# Patient Record
Sex: Female | Born: 1950 | Race: White | Hispanic: No | Smoking: Current every day smoker
Health system: Southern US, Community
[De-identification: ages and names within clinical notes are randomized; demographics above are authoritative.]

## PROBLEM LIST (undated history)

## (undated) DIAGNOSIS — D649 Anemia, unspecified: Secondary | ICD-10-CM

## (undated) DIAGNOSIS — M1990 Unspecified osteoarthritis, unspecified site: Secondary | ICD-10-CM

## (undated) DIAGNOSIS — G510 Bell's palsy: Secondary | ICD-10-CM

## (undated) DIAGNOSIS — K047 Periapical abscess without sinus: Secondary | ICD-10-CM

## (undated) DIAGNOSIS — F329 Major depressive disorder, single episode, unspecified: Secondary | ICD-10-CM

## (undated) DIAGNOSIS — E785 Hyperlipidemia, unspecified: Secondary | ICD-10-CM

## (undated) DIAGNOSIS — G4733 Obstructive sleep apnea (adult) (pediatric): Secondary | ICD-10-CM

## (undated) DIAGNOSIS — M8580 Other specified disorders of bone density and structure, unspecified site: Secondary | ICD-10-CM

## (undated) DIAGNOSIS — K589 Irritable bowel syndrome without diarrhea: Secondary | ICD-10-CM

## (undated) DIAGNOSIS — T4145XA Adverse effect of unspecified anesthetic, initial encounter: Secondary | ICD-10-CM

## (undated) DIAGNOSIS — J449 Chronic obstructive pulmonary disease, unspecified: Secondary | ICD-10-CM

## (undated) DIAGNOSIS — E559 Vitamin D deficiency, unspecified: Secondary | ICD-10-CM

## (undated) DIAGNOSIS — E119 Type 2 diabetes mellitus without complications: Secondary | ICD-10-CM

## (undated) DIAGNOSIS — F419 Anxiety disorder, unspecified: Secondary | ICD-10-CM

## (undated) DIAGNOSIS — K219 Gastro-esophageal reflux disease without esophagitis: Secondary | ICD-10-CM

## (undated) DIAGNOSIS — G309 Alzheimer's disease, unspecified: Secondary | ICD-10-CM

## (undated) DIAGNOSIS — T8859XA Other complications of anesthesia, initial encounter: Secondary | ICD-10-CM

## (undated) DIAGNOSIS — F0280 Dementia in other diseases classified elsewhere without behavioral disturbance: Secondary | ICD-10-CM

## (undated) DIAGNOSIS — G4730 Sleep apnea, unspecified: Secondary | ICD-10-CM

## (undated) DIAGNOSIS — T7840XA Allergy, unspecified, initial encounter: Secondary | ICD-10-CM

## (undated) DIAGNOSIS — S7292XA Unspecified fracture of left femur, initial encounter for closed fracture: Secondary | ICD-10-CM

## (undated) DIAGNOSIS — H269 Unspecified cataract: Secondary | ICD-10-CM

## (undated) DIAGNOSIS — F32A Depression, unspecified: Secondary | ICD-10-CM

## (undated) DIAGNOSIS — K8020 Calculus of gallbladder without cholecystitis without obstruction: Secondary | ICD-10-CM

## (undated) DIAGNOSIS — E66813 Obesity, class 3: Secondary | ICD-10-CM

## (undated) DIAGNOSIS — I10 Essential (primary) hypertension: Secondary | ICD-10-CM

## (undated) HISTORY — PX: TONSILLECTOMY AND ADENOIDECTOMY: SUR1326

## (undated) HISTORY — DX: Hyperlipidemia, unspecified: E78.5

## (undated) HISTORY — DX: Allergy, unspecified, initial encounter: T78.40XA

## (undated) HISTORY — DX: Calculus of gallbladder without cholecystitis without obstruction: K80.20

## (undated) HISTORY — PX: CATARACT EXTRACTION: SUR2

## (undated) HISTORY — DX: Depression, unspecified: F32.A

## (undated) HISTORY — PX: EYE SURGERY: SHX253

## (undated) HISTORY — DX: Irritable bowel syndrome without diarrhea: K58.9

## (undated) HISTORY — DX: Bell's palsy: G51.0

## (undated) HISTORY — DX: Obesity, class 3: E66.813

## (undated) HISTORY — DX: Unspecified fracture of left femur, initial encounter for closed fracture: S72.92XA

## (undated) HISTORY — DX: Essential (primary) hypertension: I10

## (undated) HISTORY — DX: Sleep apnea, unspecified: G47.30

## (undated) HISTORY — DX: Obstructive sleep apnea (adult) (pediatric): G47.33

## (undated) HISTORY — DX: Vitamin D deficiency, unspecified: E55.9

## (undated) HISTORY — PX: BLADDER SUSPENSION: SHX72

## (undated) HISTORY — DX: Other specified disorders of bone density and structure, unspecified site: M85.80

## (undated) HISTORY — DX: Morbid (severe) obesity due to excess calories: E66.01

## (undated) HISTORY — DX: Dementia in other diseases classified elsewhere, unspecified severity, without behavioral disturbance, psychotic disturbance, mood disturbance, and anxiety: F02.80

## (undated) HISTORY — PX: JOINT REPLACEMENT: SHX530

## (undated) HISTORY — DX: Major depressive disorder, single episode, unspecified: F32.9

## (undated) HISTORY — DX: Anxiety disorder, unspecified: F41.9

## (undated) HISTORY — DX: Unspecified cataract: H26.9

## (undated) HISTORY — DX: Alzheimer's disease, unspecified: G30.9

## (undated) HISTORY — PX: SPINE SURGERY: SHX786

---
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
## 1985-01-07 DIAGNOSIS — G510 Bell's palsy: Secondary | ICD-10-CM | POA: Insufficient documentation

## 1985-01-07 HISTORY — DX: Bell's palsy: G51.0

## 1988-02-07 DIAGNOSIS — K589 Irritable bowel syndrome without diarrhea: Secondary | ICD-10-CM

## 1988-02-07 HISTORY — DX: Irritable bowel syndrome, unspecified: K58.9

## 1988-04-09 DIAGNOSIS — F325 Major depressive disorder, single episode, in full remission: Secondary | ICD-10-CM | POA: Insufficient documentation

## 1998-08-02 ENCOUNTER — Encounter: Payer: Self-pay | Admitting: Internal Medicine

## 1998-08-02 ENCOUNTER — Ambulatory Visit (HOSPITAL_COMMUNITY): Admission: RE | Admit: 1998-08-02 | Discharge: 1998-08-02 | Payer: Self-pay | Admitting: Internal Medicine

## 1999-01-06 ENCOUNTER — Other Ambulatory Visit: Admission: RE | Admit: 1999-01-06 | Discharge: 1999-01-06 | Payer: Self-pay | Admitting: *Deleted

## 1999-01-06 ENCOUNTER — Encounter (INDEPENDENT_AMBULATORY_CARE_PROVIDER_SITE_OTHER): Payer: Self-pay

## 1999-08-13 ENCOUNTER — Ambulatory Visit (HOSPITAL_COMMUNITY): Admission: RE | Admit: 1999-08-13 | Discharge: 1999-08-13 | Payer: Self-pay | Admitting: Internal Medicine

## 1999-08-13 ENCOUNTER — Encounter: Payer: Self-pay | Admitting: Internal Medicine

## 2000-01-07 ENCOUNTER — Other Ambulatory Visit: Admission: RE | Admit: 2000-01-07 | Discharge: 2000-01-07 | Payer: Self-pay | Admitting: *Deleted

## 2000-12-14 ENCOUNTER — Ambulatory Visit (HOSPITAL_BASED_OUTPATIENT_CLINIC_OR_DEPARTMENT_OTHER): Admission: RE | Admit: 2000-12-14 | Discharge: 2000-12-14 | Payer: Self-pay

## 2001-01-21 ENCOUNTER — Other Ambulatory Visit: Admission: RE | Admit: 2001-01-21 | Discharge: 2001-01-21 | Payer: Self-pay | Admitting: *Deleted

## 2002-02-23 ENCOUNTER — Other Ambulatory Visit: Admission: RE | Admit: 2002-02-23 | Discharge: 2002-02-23 | Payer: Self-pay | Admitting: *Deleted

## 2002-10-16 ENCOUNTER — Encounter: Admission: RE | Admit: 2002-10-16 | Discharge: 2003-01-14 | Payer: Self-pay | Admitting: Internal Medicine

## 2003-02-26 ENCOUNTER — Other Ambulatory Visit: Admission: RE | Admit: 2003-02-26 | Discharge: 2003-02-26 | Payer: Self-pay | Admitting: *Deleted

## 2003-10-26 ENCOUNTER — Ambulatory Visit (HOSPITAL_COMMUNITY): Admission: RE | Admit: 2003-10-26 | Discharge: 2003-10-26 | Payer: Self-pay | Admitting: Internal Medicine

## 2004-02-27 ENCOUNTER — Other Ambulatory Visit: Admission: RE | Admit: 2004-02-27 | Discharge: 2004-02-27 | Payer: Self-pay | Admitting: *Deleted

## 2004-11-12 ENCOUNTER — Ambulatory Visit (HOSPITAL_COMMUNITY): Admission: RE | Admit: 2004-11-12 | Discharge: 2004-11-12 | Payer: Self-pay | Admitting: Anesthesiology

## 2005-04-07 ENCOUNTER — Other Ambulatory Visit: Admission: RE | Admit: 2005-04-07 | Discharge: 2005-04-07 | Payer: Self-pay | Admitting: Obstetrics & Gynecology

## 2005-05-04 ENCOUNTER — Encounter: Admission: RE | Admit: 2005-05-04 | Discharge: 2005-05-04 | Payer: Self-pay | Admitting: Gastroenterology

## 2005-05-07 HISTORY — PX: COLONOSCOPY: SHX174

## 2005-05-08 LAB — HM COLONOSCOPY: HM Colonoscopy: NORMAL

## 2006-04-06 ENCOUNTER — Ambulatory Visit (HOSPITAL_BASED_OUTPATIENT_CLINIC_OR_DEPARTMENT_OTHER): Admission: RE | Admit: 2006-04-06 | Discharge: 2006-04-06 | Payer: Self-pay | Admitting: Urology

## 2006-05-13 ENCOUNTER — Other Ambulatory Visit: Admission: RE | Admit: 2006-05-13 | Discharge: 2006-05-13 | Payer: Self-pay | Admitting: Obstetrics & Gynecology

## 2006-08-12 ENCOUNTER — Ambulatory Visit (HOSPITAL_BASED_OUTPATIENT_CLINIC_OR_DEPARTMENT_OTHER): Admission: RE | Admit: 2006-08-12 | Discharge: 2006-08-12 | Payer: Self-pay | Admitting: Urology

## 2006-12-02 ENCOUNTER — Ambulatory Visit (HOSPITAL_COMMUNITY): Admission: RE | Admit: 2006-12-02 | Discharge: 2006-12-02 | Payer: Self-pay | Admitting: Internal Medicine

## 2007-01-17 ENCOUNTER — Ambulatory Visit (HOSPITAL_BASED_OUTPATIENT_CLINIC_OR_DEPARTMENT_OTHER): Admission: RE | Admit: 2007-01-17 | Discharge: 2007-01-17 | Payer: Self-pay | Admitting: Urology

## 2007-05-23 ENCOUNTER — Other Ambulatory Visit: Admission: RE | Admit: 2007-05-23 | Discharge: 2007-05-23 | Payer: Self-pay | Admitting: Obstetrics & Gynecology

## 2008-05-30 ENCOUNTER — Other Ambulatory Visit: Admission: RE | Admit: 2008-05-30 | Discharge: 2008-05-30 | Payer: Self-pay | Admitting: Obstetrics & Gynecology

## 2008-11-16 ENCOUNTER — Ambulatory Visit (HOSPITAL_COMMUNITY): Admission: RE | Admit: 2008-11-16 | Discharge: 2008-11-16 | Payer: Self-pay | Admitting: Internal Medicine

## 2008-12-04 ENCOUNTER — Encounter: Admission: RE | Admit: 2008-12-04 | Discharge: 2009-03-04 | Payer: Self-pay | Admitting: Internal Medicine

## 2009-04-25 ENCOUNTER — Ambulatory Visit (HOSPITAL_COMMUNITY): Admission: RE | Admit: 2009-04-25 | Discharge: 2009-04-25 | Payer: Self-pay | Admitting: Chiropractic Medicine

## 2009-08-06 ENCOUNTER — Ambulatory Visit (HOSPITAL_COMMUNITY): Admission: RE | Admit: 2009-08-06 | Discharge: 2009-08-06 | Payer: Self-pay | Admitting: Anesthesiology

## 2009-09-24 ENCOUNTER — Encounter: Admission: RE | Admit: 2009-09-24 | Discharge: 2009-09-24 | Payer: Self-pay | Admitting: Internal Medicine

## 2009-10-02 ENCOUNTER — Encounter: Admission: RE | Admit: 2009-10-02 | Discharge: 2009-10-02 | Payer: Self-pay | Admitting: Internal Medicine

## 2009-12-12 ENCOUNTER — Encounter: Admission: RE | Admit: 2009-12-12 | Discharge: 2009-12-12 | Payer: Self-pay | Admitting: Internal Medicine

## 2010-03-09 HISTORY — PX: TOTAL KNEE ARTHROPLASTY: SHX125

## 2010-06-09 ENCOUNTER — Encounter (HOSPITAL_COMMUNITY)
Admission: RE | Admit: 2010-06-09 | Discharge: 2010-06-09 | Disposition: A | Discharge status: Home / Self Care | Payer: 59 | Source: Ambulatory Visit | Attending: Orthopaedic Surgery | Admitting: Orthopaedic Surgery

## 2010-06-09 DIAGNOSIS — Z0189 Encounter for other specified special examinations: Secondary | ICD-10-CM | POA: Insufficient documentation

## 2010-06-09 LAB — URINALYSIS, ROUTINE W REFLEX MICROSCOPIC
Glucose, UA: NEGATIVE mg/dL
Ketones, ur: NEGATIVE mg/dL
Nitrite: NEGATIVE
Urobilinogen, UA: 0.2 mg/dL (ref 0.0–1.0)

## 2010-06-09 LAB — URINE MICROSCOPIC-ADD ON

## 2010-06-09 LAB — SURGICAL PCR SCREEN
MRSA, PCR: NEGATIVE
Staphylococcus aureus: NEGATIVE

## 2010-06-09 LAB — COMPREHENSIVE METABOLIC PANEL
ALT: 18 U/L (ref 0–35)
AST: 22 U/L (ref 0–37)
Alkaline Phosphatase: 95 U/L (ref 39–117)
Calcium: 9.3 mg/dL (ref 8.4–10.5)
Creatinine, Ser: 0.88 mg/dL (ref 0.4–1.2)
Total Bilirubin: 0.4 mg/dL (ref 0.3–1.2)
Total Protein: 6.6 g/dL (ref 6.0–8.3)

## 2010-06-09 LAB — DIFFERENTIAL
Eosinophils Absolute: 0.3 10*3/uL (ref 0.0–0.7)
Eosinophils Relative: 2 % (ref 0–5)
Lymphocytes Relative: 25 % (ref 12–46)
Lymphs Abs: 2.7 10*3/uL (ref 0.7–4.0)
Monocytes Absolute: 0.7 10*3/uL (ref 0.1–1.0)
Monocytes Relative: 7 % (ref 3–12)
Neutrophils Relative %: 65 % (ref 43–77)

## 2010-06-09 LAB — CBC
Hemoglobin: 13.3 g/dL (ref 12.0–15.0)
MCH: 28.9 pg (ref 26.0–34.0)
MCV: 85.7 fL (ref 78.0–100.0)
RBC: 4.6 MIL/uL (ref 3.87–5.11)

## 2010-06-09 LAB — APTT: aPTT: 25 seconds (ref 24–37)

## 2010-06-10 LAB — URINE CULTURE: Culture  Setup Time: 201204021603

## 2010-06-17 ENCOUNTER — Inpatient Hospital Stay (HOSPITAL_COMMUNITY)
Admission: RE | Admit: 2010-06-17 | Discharge: 2010-06-20 | DRG: 470 | Disposition: A | Discharge status: Skilled Nursing Facility | Payer: 59 | Source: Ambulatory Visit | Attending: Orthopaedic Surgery | Admitting: Orthopaedic Surgery

## 2010-06-17 ENCOUNTER — Other Ambulatory Visit (HOSPITAL_COMMUNITY): Payer: Self-pay | Admitting: Orthopaedic Surgery

## 2010-06-17 ENCOUNTER — Ambulatory Visit (HOSPITAL_COMMUNITY)
Admission: RE | Admit: 2010-06-17 | Discharge: 2010-06-17 | Disposition: A | Discharge status: Home / Self Care | Payer: 59 | Source: Ambulatory Visit | Attending: Orthopaedic Surgery | Admitting: Orthopaedic Surgery

## 2010-06-17 DIAGNOSIS — I10 Essential (primary) hypertension: Secondary | ICD-10-CM | POA: Diagnosis present

## 2010-06-17 DIAGNOSIS — F17200 Nicotine dependence, unspecified, uncomplicated: Secondary | ICD-10-CM | POA: Diagnosis present

## 2010-06-17 DIAGNOSIS — K589 Irritable bowel syndrome without diarrhea: Secondary | ICD-10-CM | POA: Diagnosis present

## 2010-06-17 DIAGNOSIS — E119 Type 2 diabetes mellitus without complications: Secondary | ICD-10-CM | POA: Diagnosis present

## 2010-06-17 DIAGNOSIS — J449 Chronic obstructive pulmonary disease, unspecified: Secondary | ICD-10-CM | POA: Diagnosis present

## 2010-06-17 DIAGNOSIS — E669 Obesity, unspecified: Secondary | ICD-10-CM | POA: Diagnosis present

## 2010-06-17 DIAGNOSIS — E559 Vitamin D deficiency, unspecified: Secondary | ICD-10-CM | POA: Diagnosis present

## 2010-06-17 DIAGNOSIS — E785 Hyperlipidemia, unspecified: Secondary | ICD-10-CM | POA: Diagnosis present

## 2010-06-17 DIAGNOSIS — M1710 Unilateral primary osteoarthritis, unspecified knee: Principal | ICD-10-CM | POA: Diagnosis present

## 2010-06-17 DIAGNOSIS — J4489 Other specified chronic obstructive pulmonary disease: Secondary | ICD-10-CM | POA: Diagnosis present

## 2010-06-17 DIAGNOSIS — D62 Acute posthemorrhagic anemia: Secondary | ICD-10-CM | POA: Diagnosis not present

## 2010-06-17 LAB — ABO/RH: ABO/RH(D): O POS

## 2010-06-17 LAB — GLUCOSE, CAPILLARY: Glucose-Capillary: 98 mg/dL (ref 70–99)

## 2010-06-17 LAB — HCG, SERUM, QUALITATIVE: Preg, Serum: NEGATIVE

## 2010-06-18 LAB — CBC
HCT: 32.2 % — ABNORMAL LOW (ref 36.0–46.0)
MCHC: 32 g/dL (ref 30.0–36.0)
MCV: 87.7 fL (ref 78.0–100.0)
Platelets: 159 10*3/uL (ref 150–400)
RDW: 14.3 % (ref 11.5–15.5)

## 2010-06-18 LAB — HEMOGLOBIN A1C
Hgb A1c MFr Bld: 6.7 % — ABNORMAL HIGH (ref <5.7)
Mean Plasma Glucose: 146 mg/dL — ABNORMAL HIGH (ref <117)

## 2010-06-18 LAB — COMPREHENSIVE METABOLIC PANEL
Albumin: 2.8 g/dL — ABNORMAL LOW (ref 3.5–5.2)
BUN: 10 mg/dL (ref 6–23)
Calcium: 8.3 mg/dL — ABNORMAL LOW (ref 8.4–10.5)
Glucose, Bld: 107 mg/dL — ABNORMAL HIGH (ref 70–99)
Sodium: 137 mEq/L (ref 135–145)
Total Protein: 5.4 g/dL — ABNORMAL LOW (ref 6.0–8.3)

## 2010-06-18 LAB — GLUCOSE, CAPILLARY
Glucose-Capillary: 105 mg/dL — ABNORMAL HIGH (ref 70–99)
Glucose-Capillary: 129 mg/dL — ABNORMAL HIGH (ref 70–99)
Glucose-Capillary: 105 mg/dL — ABNORMAL HIGH (ref 70–99)

## 2010-06-19 LAB — COMPREHENSIVE METABOLIC PANEL
AST: 28 U/L (ref 0–37)
Albumin: 2.8 g/dL — ABNORMAL LOW (ref 3.5–5.2)
CO2: 28 mEq/L (ref 19–32)
Calcium: 8.5 mg/dL (ref 8.4–10.5)
Creatinine, Ser: 0.84 mg/dL (ref 0.4–1.2)
GFR calc Af Amer: >60 mL/min (ref >60)
GFR calc non Af Amer: >60 mL/min (ref >60)
Total Protein: 5.7 g/dL — ABNORMAL LOW (ref 6.0–8.3)

## 2010-06-19 LAB — CBC
MCH: 27.8 pg (ref 26.0–34.0)
MCHC: 32.5 g/dL (ref 30.0–36.0)
Platelets: 167 10*3/uL (ref 150–400)
RDW: 13.9 % (ref 11.5–15.5)

## 2010-06-19 LAB — GLUCOSE, CAPILLARY

## 2010-06-20 DIAGNOSIS — M79609 Pain in unspecified limb: Secondary | ICD-10-CM

## 2010-06-20 LAB — CROSSMATCH

## 2010-06-20 LAB — COMPREHENSIVE METABOLIC PANEL
ALT: 20 U/L (ref 0–35)
BUN: 5 mg/dL — ABNORMAL LOW (ref 6–23)
Calcium: 8.5 mg/dL (ref 8.4–10.5)
Glucose, Bld: 100 mg/dL — ABNORMAL HIGH (ref 70–99)
Sodium: 137 mEq/L (ref 135–145)
Total Protein: 5.7 g/dL — ABNORMAL LOW (ref 6.0–8.3)

## 2010-06-20 LAB — GLUCOSE, CAPILLARY
Glucose-Capillary: 111 mg/dL — ABNORMAL HIGH (ref 70–99)
Glucose-Capillary: 98 mg/dL (ref 70–99)
Glucose-Capillary: 98 mg/dL (ref 70–99)

## 2010-06-20 LAB — CBC
HCT: 29.8 % — ABNORMAL LOW (ref 36.0–46.0)
MCHC: 32.9 g/dL (ref 30.0–36.0)
RDW: 14 % (ref 11.5–15.5)

## 2010-06-22 ENCOUNTER — Inpatient Hospital Stay (HOSPITAL_COMMUNITY)
Admission: EM | Admit: 2010-06-22 | Discharge: 2010-06-25 | DRG: 561 | Disposition: A | Discharge status: Skilled Nursing Facility | Payer: 59 | Attending: Orthopaedic Surgery | Admitting: Orthopaedic Surgery

## 2010-06-22 ENCOUNTER — Emergency Department (HOSPITAL_COMMUNITY): Payer: 59

## 2010-06-22 DIAGNOSIS — K219 Gastro-esophageal reflux disease without esophagitis: Secondary | ICD-10-CM | POA: Diagnosis present

## 2010-06-22 DIAGNOSIS — I10 Essential (primary) hypertension: Secondary | ICD-10-CM | POA: Diagnosis present

## 2010-06-22 DIAGNOSIS — E669 Obesity, unspecified: Secondary | ICD-10-CM | POA: Diagnosis present

## 2010-06-22 DIAGNOSIS — E559 Vitamin D deficiency, unspecified: Secondary | ICD-10-CM | POA: Diagnosis present

## 2010-06-22 DIAGNOSIS — E785 Hyperlipidemia, unspecified: Secondary | ICD-10-CM | POA: Diagnosis present

## 2010-06-22 DIAGNOSIS — E119 Type 2 diabetes mellitus without complications: Secondary | ICD-10-CM | POA: Diagnosis present

## 2010-06-22 DIAGNOSIS — J4489 Other specified chronic obstructive pulmonary disease: Secondary | ICD-10-CM | POA: Diagnosis present

## 2010-06-22 DIAGNOSIS — E7800 Pure hypercholesterolemia, unspecified: Secondary | ICD-10-CM | POA: Diagnosis present

## 2010-06-22 DIAGNOSIS — Z96659 Presence of unspecified artificial knee joint: Secondary | ICD-10-CM

## 2010-06-22 DIAGNOSIS — Y9210 Unspecified residential institution as the place of occurrence of the external cause: Secondary | ICD-10-CM | POA: Diagnosis present

## 2010-06-22 DIAGNOSIS — Z87891 Personal history of nicotine dependence: Secondary | ICD-10-CM

## 2010-06-22 DIAGNOSIS — J449 Chronic obstructive pulmonary disease, unspecified: Secondary | ICD-10-CM | POA: Diagnosis present

## 2010-06-22 DIAGNOSIS — T84029A Dislocation of unspecified internal joint prosthesis, initial encounter: Principal | ICD-10-CM | POA: Diagnosis present

## 2010-06-22 DIAGNOSIS — K589 Irritable bowel syndrome without diarrhea: Secondary | ICD-10-CM | POA: Diagnosis present

## 2010-06-22 DIAGNOSIS — X58XXXA Exposure to other specified factors, initial encounter: Secondary | ICD-10-CM

## 2010-06-22 LAB — POCT I-STAT, CHEM 8
BUN: 10 mg/dL (ref 6–23)
Calcium, Ion: 1.05 mmol/L — ABNORMAL LOW (ref 1.12–1.32)
Chloride: 100 meq/L (ref 96–112)
Creatinine, Ser: 0.9 mg/dL (ref 0.4–1.2)
Glucose, Bld: 98 mg/dL (ref 70–99)
HCT: 31 % — ABNORMAL LOW (ref 36.0–46.0)
Hemoglobin: 10.5 g/dL — ABNORMAL LOW (ref 12.0–15.0)
Potassium: 3.5 meq/L (ref 3.5–5.1)
Sodium: 137 mEq/L (ref 135–145)
TCO2: 28 mmol/L (ref 0–100)

## 2010-06-22 LAB — DIFFERENTIAL
Basophils Absolute: 0 10*3/uL (ref 0.0–0.1)
Basophils Relative: 0 % (ref 0–1)
Neutro Abs: 4.2 10*3/uL (ref 1.7–7.7)
Neutrophils Relative %: 62 % (ref 43–77)

## 2010-06-22 LAB — CBC
Hemoglobin: 10.1 g/dL — ABNORMAL LOW (ref 12.0–15.0)
MCHC: 32.9 g/dL (ref 30.0–36.0)
Platelets: 217 10*3/uL (ref 150–400)
RBC: 3.63 MIL/uL — ABNORMAL LOW (ref 3.87–5.11)

## 2010-06-22 LAB — PROTIME-INR
INR: 1.69 — ABNORMAL HIGH (ref 0.00–1.49)
Prothrombin Time: 20.1 seconds — ABNORMAL HIGH (ref 11.6–15.2)

## 2010-06-23 LAB — GLUCOSE, CAPILLARY: Glucose-Capillary: 97 mg/dL (ref 70–99)

## 2010-06-23 NOTE — H&P (Signed)
[name], [name]                ACCOUNT NO.:  [id number]  MEDICAL RECORD NO.:  [id number]           PATIENT TYPE:  I  LOCATION:  5010                         FACILITY:  [facility]  PHYSICIAN:  Eulas Post, MD    DATE OF BIRTH:  Apr 22, 1950  DATE OF ADMISSION:  06/22/2010 DATE OF DISCHARGE:                             HISTORY & PHYSICAL   CHIEF COMPLAINT:  Left knee pain.  HISTORY:  [name] is a pleasant 60 year old woman who had a left total knee arthroplasty performed by Dr. Cleophas Dunker on June 17, 2010.  There was apparently some concern regarding a partial MCL injury at that time.  She did have a rotating platform arthroplasty performed. She was getting out of a chair and walking with her walker going from the seat to a stand yesterday when she was in [place] at her rehab facility, and had the acute onset of severe pain.  She says she did not think that she was twisting, but mainly trying to stand and propel herself forward in a straight line.  Nonetheless, she had acute onset severe pain in the left knee and was unable to walk.  She was brought into the emergency room today for evaluation.  She continues to have 9/10 pain directly over the left knee, and weightbearing is impossible. Rest makes it better.  PAST HISTORY:  Significant for diabetes as well as hypertension and hypercholesterolemia, history of irritable bowel syndrome, sleep apnea, history of COPD, vitamin D deficiency, and recent acute blood loss anemia.  FAMILY HISTORY:  Her father has diabetes and heart disease.  SOCIAL HISTORY:  She has recently quit smoking.  REVIEW OF SYSTEMS:  Complete review of systems was performed and was otherwise negative with the exception of those mentioned in the history of present illness.  PHYSICAL EXAMINATION:  CONSTITUTIONAL:  She is alert and oriented x3 and appropriate with me throughout our interaction.  She is well-developed, well-nourished. EYE:   Extraocular movements are intact. LYMPHATIC:  She has no axillary or cervical lymphadenopathy. CARDIOVASCULAR:  She has some moderate soft tissue swelling in her left leg, but no substantial pitting edema.  She has intact pedal pulses bilaterally. RESPIRATORY:  She has no cyanosis and no increase in respiratory efforts. GI:  Her abdomen is soft, nontender with no rebound or guarding, and no hepatosplenomegaly. PSYCHIATRIC:  She is appropriate with me throughout our interaction and competent for consent. SKIN:  Her surgical wounds over her left total knee replacement appear clean and there is no drainage and no evidence for infection. NEUROLOGIC:  Sensation is intact throughout the left lower extremity. EHL and FHL are firing. MUSCULOSKELETAL:  She has a twisted moderately deformed left lower extremity.  She cannot bend her knee.  EHL and FHL are firing.  X-rays demonstrate evidence for a dislocated polyethylene from under the femoral component.  IMPRESSION:  Left total knee replacement with acute dislocation, spinout of the rotating platform polyethylene, with probable medial collateral ligament laxity.  PLAN:  This is an acute severe problem, and will require urgent intervention.  I have discussed with Beth [name] the risks associated  with an unstable prosthesis, and the fact that we do need to get this reduced as soon as possible.  My goal is to get this reduced through closed measures; however, if I am unable to reduce it closed then she may require open reduction.  If so, I will also assess the gaps, and see whether or not she needs conversion to a thicker polyethylene. Conversion to a constrained prosthesis could be an option in the future if the knee continues to be unstable, however, this is a very extensive revision operation, and would be reserved for recurrent failure of stability in the current situation.  I am hopeful that we can get this reduced closed and then be able to  brace her and let her MCL fully heel and then let her have a functioning total knee arthroplasty. Nonetheless, we will proceed urgently with a closed reduction and possible open reduction with possible polyethylene exchange later today. She last ate at 8 a.m. this morning, and apparently had some soda 2 hours ago.  We will plan to proceed accordingly.     Eulas Post, MD     JPL/MEDQ  D:  06/22/2010  T:  06/22/2010  Job:  [id number]  cc:   Lucky Cowboy, M.D. Claude Manges. Cleophas Dunker, M.D.  Electronically Signed by Teryl Lucy MD on 06/23/2010 10:18:42 AM

## 2010-06-23 NOTE — Op Note (Signed)
  [name], Beth [name]                ACCOUNT NO.:  [id number]  MEDICAL RECORD NO.:  [id number]           PATIENT TYPE:  I  LOCATION:  5010                         FACILITY:  [facility]  PHYSICIAN:  Eulas Post, MD    DATE OF BIRTH:  28-Jan-1951  DATE OF PROCEDURE:  06/22/2010 DATE OF DISCHARGE:                              OPERATIVE REPORT   ATTENDING SURGEON:  Eulas Post, MD  FIRST ASSISTANT:  Janace Litten, OPA  PREOPERATIVE DIAGNOSIS:  Dislocated left total knee arthroplasty.  POSTOPERATIVE DIAGNOSIS:  Dislocated left total knee arthroplasty.  OPERATIVE PROCEDURE:  Closed reduction under general anesthesia, left dislocated total knee arthroplasty.  PREOPERATIVE INDICATIONS:  Beth [name] is a 60 year old woman who had a left total knee arthroplasty that had MCL insufficiency that was repaired intraoperatively who was getting out of a chair at her skilled nursing facility yesterday when she had an acute onset pop and severe pain in the left knee and could not move it.  X-rays in the emergency room demonstrated a dislocation.  She elected to undergo the above named procedures.  The risks, benefits, and alternatives were discussed with her before the procedure including but not limited to the risks of recurrent dislocation, the need for open reduction, the need for revision arthroplasty, cardiopulmonary complications, among others including neurovascular injury, and she was willing to proceed.  OPERATIVE PROCEDURE:  The patient was brought to the operating room, placed in supine position.  General anesthesia was administered.  I did not give any additional antibiotics.  Closed reduction of the left knee was performed.  This was a fairly easy reduction, and I was able to get the polyethylene back underneath the femoral condyle without much difficulty.  I ranged the knee and tested the stability, and it was grossly unstable to valgus testing.  In bringing the knee into  full extension with any small bit of valgus stress caused, what I believe, the posteromedial tibia to dislocate.  The slightest bit of valgus testing caused the need to dislocate.  This actually occurred once while I was ranging the knee.  I then re-reduced it and then confirmed appropriate reduction under fluoroscopy.  Final pictures were taken and I applied a knee immobilizer.  The patient was awakened and returned to the PACU in stable and satisfactory condition.  There were no complications, and she tolerated the procedure well. She will be in a knee immobilizer and I will make her touch toe weightbearing and I informed Dr. Cleophas Dunker of her situation and he will plan for definitive management.  We will probably begin with bracing, and hopefully get the MCL to heal, and then consider additional management as needed.     Eulas Post, MD     JPL/MEDQ  D:  06/22/2010  T:  06/23/2010  Job:  [id number]  Electronically Signed by Teryl Lucy MD on 06/23/2010 10:18:45 AM

## 2010-06-24 LAB — GLUCOSE, CAPILLARY: Glucose-Capillary: 116 mg/dL — ABNORMAL HIGH (ref 70–99)

## 2010-06-25 LAB — GLUCOSE, CAPILLARY: Glucose-Capillary: 119 mg/dL — ABNORMAL HIGH (ref 70–99)

## 2010-06-26 LAB — GLUCOSE, CAPILLARY
Glucose-Capillary: 132 mg/dL — ABNORMAL HIGH (ref 70–99)
Glucose-Capillary: 132 mg/dL — ABNORMAL HIGH (ref 70–99)

## 2010-07-22 NOTE — Op Note (Signed)
NAMESALEEMA, [name]                ACCOUNT NO.:  [id number]   MEDICAL RECORD NO.:  [id number]          PATIENT TYPE:  AMB   LOCATION:  [facility]                         FACILITY:  [facility]   PHYSICIAN:  Martina Sinner, MD DATE OF BIRTH:  09-22-1950   DATE OF PROCEDURE:  DATE OF DISCHARGE:                               OPERATIVE REPORT   PREOPERATIVE DIAGNOSIS:  Stress incontinence.   POSTOPERATIVE DIAGNOSIS:  Stress incontinence.   SURGERY:  Sling cystourethropexy [facility]) plus cystoscopy.   Beth [name] has stress urinary incontinence.  She has had two previous  attempts at a sling.   The patient was prepped and draped in usual fashion.  Extra care was  taken to minimize the risk of compartment syndrome, neuropathy and DVT  when I prepped her, when I positioned her legs.  I was very careful and  in exposing her vaginal area with a long Steiner retractor and a [employer]  cerebellar.  I used a moist laparotomy pad high in the vaginal cuff to  help keep the retractor in the vagina.  I made two 1-cm incisions 1.5 cm  lateral to the midline and one fingerbreadth above the symphysis pubis  in a skin crease.   With the bladder emptied I passed the [facility] needle on top and then along  the back of the symphysis pubis parallel to the midline.  I passed it  onto the pulp of my index finger, which carefully was inserted through  the vaginal wall incision.   Prior to the above maneuver I used a marking pen and marked the mid  urethra.  I used a lot of good lighting and positioning to see the mid  urethra.  I made a nice thick incision and dissected with scissors to  the urethrovesical angle bilaterally.  I initially had instilled  approximately 7 mL of a lidocaine-epinephrine mixture.   Throughout the entire case I continued to be careful when I placed my  finger in the incision that I made vaginally since it was hard to see  due to the depth of the vagina.  There is no question that both SPARC  needles were passed through the incision lateral to the urethra and I  did not pick up any vaginal wall mucosa.  I scoped the patient and there  was no injury to the bladder or urethra, and I checked two or three  times.  There was no deflection of the bladder with deflection of the  trocar.  I had to give her some Lasix because she was not making any  urine throughout the case.  We gave her a liter of fluid and she had  excellent ureteral blue jets bilaterally.   With the bladder empty I attached the [facility] sling and brought it up  through the retropubic space.  I cut below the blue dots and removed the  sheath after irrigating the sheath.  I spent a lot of time making  certain I was happy with the tension and position of the sling, and I  was.  You could hypermobile the sling.  There  not a lot of spring-back  effect on the sling after tensioning it over my medium-sized Kelly  clamp.   After irrigating all incisions I closed my vaginal wall incision with 2-  0 Vicryl on a CT1 needle, followed by two interrupted sutures.  I was  careful not to pick up the sling.  I cut the mesh below the skin and  closed the skin incision with 4-0 Vicryl.  Dermabond was also utilized.   I used a double pack distally in the vagina for some local pressure with  Estrace cream.  Foley catheter draining blue urine at end the case.   I am hoping this operation will greatly improve Mrs. Beth [name]' quality of  life.           ______________________________  Martina Sinner, MD  Electronically Signed     SAM/MEDQ  D:  01/17/2007  T:  01/17/2007  Job:  [id number]

## 2010-07-22 NOTE — Op Note (Signed)
Beth [name], Beth [name]                ACCOUNT NO.:  [id number]   MEDICAL RECORD NO.:  [id number]          PATIENT TYPE:  AMB   LOCATION:  [facility]C                         FACILITY:  [facility]   PHYSICIAN:  Martina Sinner, MD DATE OF BIRTH:  1950-12-07   DATE OF PROCEDURE:  08/12/2006  DATE OF DISCHARGE:                               OPERATIVE REPORT   ASSISTANT:  Terie Purser, MD   PREOPERATIVE DIAGNOSIS:  Stress incontinence.   POSTOPERATIVE DIAGNOSIS:  Stress incontinence; distal urethral meatal  and distal urethral laceration.   SURGERY:  Attempted sling cyst urethropexy; closure of urethral meatal  tear and cystoscopy.   Beth [name] is a very nice middle-aged woman with stress urinary  incontinence.  Unfortunately, when I attempted the sling last time, the  procedure was going well but one of the [facility] needles was brought out  next to the urethral meatus.  I stopped the procedure thinking that the  risk of erosion was higher and I did not want to place her at this  higher risk.  She was here for repeat procedure.   She is markedly obese, especially relative to her height.  I took extra  care in positioning her legs to minimize the risk of compartment  syndrome, neuropathy and DVT.  She does have a very deep perineum and  deep labia and a large buttock that makes retraction a lot more  difficult.  For this reason I put the legs and more exaggerated  lithotomy but again she looked to be in a very comfortable position.   In order for the speculum to stay within the vagina, I used the long  Steiner vaginal speculum with two damp Ray-tecs rolled up like a ball  over its tip near the vaginal cuff to try to hold the retractor in  place.  In spite of this my assistant had to help me hold it in place on  many occasions.  I used a curved cerebellar retractor to hold open the  labia as well as 2-0 silk sutures.  Overall I was very pleased with the  exposure though there was a lot of depth  of approximately 7 or 8 cm just  to get to the mid urethra.   I made two 1 cm incisions in her skin crease one fingerbreadth above the  symphysis pubis and 1.5 cm lateral to the midline.   With the bladder empty I carefully passed a SPARC needle on top of along  the back of the pubic bone along the pulp of my index finger.  When I  examined the patient I unfortunately did what I think I did during the  last case and I brought the needle out at the level of the urethral  meatus.  In retrospect this was done since her urethral orifice is very  large.  It very patulous and instead of inserting my finger through the  vaginal wall incision.  I placed it into the meatus and delivered the  needle through it.   The urethral mucosa at the level of the meatus was bleeding minimally  and I oversewed it with two interrupted 4-0 Vicryl suture.  I then  removed the needle and cystoscoped the patient and there is no question  the floor of the urethra was very healthy throughout its entire length  of 2 to 2.5 cm.  I could see what I thought was probable urethral injury  at 2 o'clock approximately 0.5 cm in from the urethral meatus and then  in my opinion this was the entrance of the needle.     I could nicely identify the mid urethra and I instilled 8 or 9 mL of  lidocaine epinephrine mixture with an Allis placed just proximal to the  meatus for mild counter traction.  I made an incision that was through  the pubocervical fascia at a depth that was allow me to close a nice  pubocervical flap over the sling.  I bluntly dissected to the  urethrovesical angle bilaterally.  There was little to no bleeding.  I  was very happy with dissection.   I carefully cystoscoped the patient and decided to place the needle  again to see if it was going be close to the injury.  Using the same  technique as described above,  I placed the [facility] needle again on top of  along the back the pubic bone onto the pulp of  my index finger through  the correct incision at the level of the urethrovesical angle.  When I  cystoscoped the patient, there was efflux of indigo carmine from the  ureteral orifice and no indentation of the bladder or injury to the  bladder.  There was no indentation or injury to the urethra but I really  felt that the sling would be lying too close to the entrance hole was  described above.  For this reason I did not want to put in a synthetic  sling.   I decided then to use for an non synthetic sling since I felt this was  the best chance to reach the patient's treatment goal and not putting  her at higher risk of erosion.  I asked for two Stamey needles and had  the correct retractors in place for the abdominal incision since she was  quite obese.  I cut a 10 x 2 cm dermal sling and over sewed each end  with 0-0 Prolene passing through the sling approximately six or seven  times.  Hemostats were placed over the long ends of the Prolene suture.   Abdominally I joined the two 1.5 cm incisions in the midline and using  straight retractors and then Deavers I was able to do to dissect with  cautery and cutting current down to the fascia 1 cm above the level of  the pubic symphysis identifying a landing area bilaterally.  There was  good fascia and I was medial to the external ring or that where a  superficial nerve often lies.   I then used my usual right angle technique to break through the  endopelvic fascia bilaterally.  She had a thick pelvic floor and I  dissected and I initially attempted on the left side.  I broke through  the endopelvic fascia partially and could feel the nice smooth  periosteum of the symphysis pubis for approximately 1.5 cm around its  lower edge posteriorly.  She was very difficult to do using my usual  technique because of her obesity.  I was not able to drop the handle down the right angle to get it to  get up around the pubic bone safely  even after I  removed the long weighted speculum.  I attempted this many  times but was feeling uncomfortable that I may injure her bladder or  urethra.  I was having to use feel and my visibility was not good  because of her obesity.  Each time I removed my retractors, my curved  cerebellar retractor, or the speculum I could not see whatsoever.   I sat and thought things over for a long while and really thought that  it was not safe to continue to try to break through the endopelvic  fascia from below due to her obesity.  Because of the initial  complication, I did not want to have another complication especially in  a patient who is at higher risk of having problems postoperatively if we  do have a complication.   I decided to stop the case.  I irrigated the wound above with  antibiotics and saline.  I closed the fatty tissue with running 3-0  Vicryl and the skin with running 4-0 subcuticular Vicryl.  I closed the  vaginal wall incision with 2-0 Vicryl on a CT1 needle.  I used longer  instruments to reach the incision.  There was little to no bleeding  whatsoever.   I removed the Foley catheter again and gave more indigo carmine.  There  is no bladder or urethral injury.  There was efflux of indigo carmine  bilaterally.  I placed a Foley catheter.  I placed a vaginal pack in  place.  The patient's legs were lowered.  Dermabond was applied to the  abdominal incision.   I feel badly that Beth [name] has had now two procedures has not had a  successfully placed sling.  I am going to speak with her postoperatively  and take out her Foley catheter on Monday.  I will keep her on  ciprofloxacin and give  her Vicodin p.r.n.  She may wish to have her procedure repeated the  future though I think she is also an excellent candidate because of what  she has gone through for urethral injectables.  Unfortunately because of  her age and activity level, and obesity injectables is not near as  efficacious as a  sling.           ______________________________  Martina Sinner, MD  Electronically Signed     SAM/MEDQ  D:  08/12/2006  T:  08/12/2006  Job:  [id number]

## 2010-07-23 NOTE — Discharge Summary (Signed)
Beth [name], Beth [name]                ACCOUNT NO.:  [id number]  MEDICAL RECORD NO.:  [id number]           PATIENT TYPE:  I  LOCATION:  5023                         FACILITY:  [facility]  PHYSICIAN:  Claude Manges. Marymargaret Kirker, M.D.DATE OF BIRTH:  June 04, 1950  DATE OF ADMISSION:  06/17/2010 DATE OF DISCHARGE:  06/20/2010                        DISCHARGE SUMMARY - REFERRING   ADMISSION DIAGNOSIS:  Osteoarthritis of the left knee.  DISCHARGE DIAGNOSES: 1. Osteoarthritis of the left knee. 2. History hypertension. 3. Non-insulin dependent diabetes. 4. Hyperlipidemia. 5. History of irritable bowel syndrome. 6. History of obesity. 7. Hyperlipidemia. 8. Sleep apnea. 9. Chronic obstructive pulmonary disease. 10.Vitamin D deficiency. 11.Acute blood loss anemia.  HISTORY:  Beth [name] is a 60 year old white female with chronic left knee pain, which is worsening despite cortisone and viscosupplementation.  She has had arthroscopic debridement in July 2011 revealing grade 3 and 4 medial femoral condyle and tibial plateau OA. Now, pain is moderately severe, more of an aching and throbbing pain, which is worsening with walking and activities.  She has therapy at nighttime.  She has radiographic end-stage OA on left knee.  Indicated now for left total knee arthroplasty.  HOSPITAL COURSE:  A 60 year old female, admitted on June 17, 2010 after appropriate laboratory studies were obtained as well as 1 gram vancomycin IV on-call to the operating room.  She was taken to the operating room, where she underwent a left total knee arthroplasty.  She tolerated the procedure well.  This was performed by Dr. Norlene Campbell assisted by Oris Drone. Petrarca, P.A.-C.  This included a LCS standard femoral component with #3 rotating keeled metallic tibial tray, a 17.5- mm bridging bearing, a three-peg metal back rotating patella, and all components were secured with polymethyl methacrylate.  She also had a repair of  the deep capsule medially with a Mitek anchor.  She tolerated the procedure well.  She was placed on a CPM machine.  Started on Dilaudid PCA for pain management.  60 grams carb-modified diet was started.  Started on Xarelto on 06/18/2010 at 7 a.m.  IV Tylenol 1 gram was given at the time of induction in surgery, and she was given every 6 hours for a total of 6 doses.  CPM was placed 0-60 degrees for 8 hours per day, increase by 5 degrees.  A knee immobilizer was placed to the left knee when walking.  Her metformin was held for 36 hours.  A knee immobilizer was placed to the left knee when she was allowed to ambulate.  She was allowed out of bed to chair the following day.  Her PCA was then weaned and a Foley was discontinued.  She was weaned off O2 keeping her sats greater than 92%.  Placed with a nicotine patch 21 mg daily.  On postop day #2, her dressing was changed revealing a wound to be clean and dry.  There were no signs of infection.  She did have a little bit of tenderness in the calf and a Doppler has been ordered prior to her discharge tomorrow.  She may shower.  OT consult was also ordered.  She has  had marked improvement postoperatively, and she is planned for discharge on 06/20/2010 to [facility] for continued physical therapy.  LABORATORY STUDIES:  She is admitted with hemoglobin of 13.3, hematocrit 39.4%, white count 10,500, platelets 224,000.  Hemoglobin of June 19, 2010 was 9.8, hematocrit 30.2%, white count 8400, and platelets was 167,000.  Preoperative pro-time 12.8, INR 0.94, PTT was 25.  Preop sodium 138, potassium 4.0, chloride 101, CO2 of 27, glucose 164, BUN 15, creatinine 0.88, GFR greater than 60, total bilirubin 0.4, alk phos 95, SGOT 22, SGPT 18, total protein 6.6, albumin 3.8, and calcium was 9.3. Hemoglobin A1c was 6.7.  Of June 19, 2010, preoperative sodium 136, potassium 3.9, chloride 101, CO2 of 28, glucose 103, BUN 6, creatinine 0.84, GFR greater  than 60, bilirubin 0.9, alk phos 70, GOT 28, GPT 20, total protein 5.7, albumin 2.8, calcium 8.5.  RADIOGRAPHIC STUDIES:  Chest x-ray of June 17, 2010 reveals no evidence of acute pulmonary disease.  DISCHARGE INSTRUCTIONS:  She is to be placed on a 60 grams carb-modified diet.  She will increase activities as tolerated.  No lifting or driving for 6 weeks.  She may shower with the dressing that she has on until Sunday, at that time she can remove the dressing and the wound should be dressed daily with 4x4 gauze and tape.  She is to be 50% weightbearing as taught in physical therapy using her walker as well as the knee immobilizer to the left knee for transfers and when ambulating.  She needs to call the office at [phone] for an appointment with Dr. Cleophas Dunker on June 30, 2010 for staple removal.  CPM from 0-60 degrees for 8 hours per day is ordered, increasing by 5-10 degrees per day using CPM for 3 weeks.  Physical therapy should limit any maneuvers that may stress the medial collateral ligament.  She was discharged in improved condition.  Her med rec sheet is accompanying this dictation.     [name], P.A.-C.   ______________________________ Claude Manges. Cleophas Dunker, M.D.    BDP/MEDQ  D:  06/19/2010  T:  06/19/2010  Job:  [id number]  Electronically Signed by Jacqualine Code P.A.-C. on 07/10/2010 02:32:45 PM Electronically Signed by Norlene Campbell M.D. on 07/23/2010 01:40:55 PM

## 2010-07-23 NOTE — Discharge Summary (Signed)
NAMEKORYNNE, Beth [name]                ACCOUNT NO.:  [id number]  MEDICAL RECORD NO.:  [id number]           PATIENT TYPE:  I  LOCATION:  5010                         FACILITY:  [facility]  PHYSICIAN:  Claude Manges. Leisl Spurrier, M.D.DATE OF BIRTH:  [birth year]  DATE OF ADMISSION:  06/22/2010 DATE OF DISCHARGE:  06/25/2010                        DISCHARGE SUMMARY - REFERRING   ADMISSION DIAGNOSIS:  Dislocation of the left total knee arthroplasty.  DISCHARGE DIAGNOSIS:  Dislocation of the left total knee arthroplasty.  PROCEDURE:  Closed reduction left total knee arthroplasty.  HISTORY:  [name] is a 60 year old female who on the Tuesday prior to her admission had undergone a left total knee arthroplasty with MCL insufficiency with repair.  She apparently was at the skilled nursing facility and according to the attending physician who said that she was getting out of a chair at the facility.  According to her she was in the bathroom and she is not really sure exactly, whether she was standing or getting up or possible starting to sit-down, but she felt a pop in her knee and immediate pain and discomfort.  She was brought to the emergency room.  X-rays obtained revealing a dislocation in left total knee and at that time she was admitted by Dr. Teryl Lucy.  HOSPITAL COURSE:  A 60 year old white female admitted on June 22, 2010. Appropriate anesthesia consultation and 2 grams of Ancef IV on-call to the operating room was taken to the operating room where she underwent a closed reduction of the left total knee arthroplasty.  She tolerated the procedure well.  She was placed into a knee immobilizer.  Following day she was transferred to the service of Dr. Norlene Campbell for continued care.  She has been placed into the knee immobilizer and on postop day #1 had a normal neurovascular exam.  She has been participating well with physical therapy.  She likes to return to [employer].  She  will follow back up with [place] in the office on Monday the 23rd for staple removal.  No laboratory studies were obtained.  DISCHARGE INSTRUCTIONS:  She is to be on a 60 grams carb-modified diet for her diabetes.  No driving or lifting for 6 more weeks.  She is to be touchdown weightbearing on the left leg as instructed by the physical therapist.  Use TED hose for 2 more weeks on the left leg.  She is to be seen in the office on June 30, 2010 by Dr. Cleophas Dunker.  She can be using a CPM 0-60 degrees out of the immobilizer.  Otherwise, she needs to be in the knee brace locked in full extension.  She is not to be ambulating or be out of the knee immobilizer except when she is on the CPM.  Physical therapy can do to straight leg raising in the brace as well as the hip abduction exercises.  She was discharged in improved condition.  See med rec sheet for medications.     [name], P.A.-C.   ______________________________ Claude Manges. Cleophas Dunker, M.D.    BDP/MEDQ  D:  06/25/2010  T:  06/25/2010  Job:  [id number] Electronically Signed by Jacqualine Code P.A.-C. on 07/10/2010 02:33:07 PM Electronically Signed by Norlene Campbell M.D. on 07/23/2010 01:40:58 PM

## 2010-07-23 NOTE — Op Note (Signed)
Beth [name], BERKERY                ACCOUNT NO.:  [id number]  MEDICAL RECORD NO.:  [id number]           PATIENT TYPE:  LOCATION:                                 FACILITY:  PHYSICIAN:  Claude Manges. Jarrett Chicoine, M.D.DATE OF BIRTH:  Apr 11, 1950  DATE OF PROCEDURE:  06/17/2010 DATE OF DISCHARGE:                              OPERATIVE REPORT   PREOPERATIVE DIAGNOSIS:  End-stage osteoarthritis, left knee.  POSTOPERATIVE DIAGNOSIS:  End-stage osteoarthritis, left knee.  PROCEDURE:  Left total knee replacement.  SURGEON:  Claude Manges. Cleophas Dunker, M.D.  ASSISTANT:  Arlys John D. Petrarca, P.A.-C.  ANESTHESIA:  General with supplemental femoral nerve block.  COMPONENTS:  DePuy LCS standard femoral component, #3 rotating keeled metallic tibial tray, 17.5-mm bridging bearing, a three-peg metal back rotating patella.  All these components were secured with polymethyl methacrylate.  PROCEDURE IN DETAIL:  Ms. Gentz was met in the holding area, identified her left knee as the appropriate operative site, and answered any questions.  She did receive a preoperative femoral nerve block.  She was then transported to room #1 and placed under general orotracheal anesthesia without difficulty.  The nursing staff inserted a Foley catheter.  The urine was clear.  Tourniquet was then applied to the left lower extremity.  Leg was prepped with the Betadine scrub and DuraPrep from the tourniquet to the midfoot.  Sterile draping was performed with the extremity still elevated, was Esmarch exsanguinated with a proximal tourniquet at 350 mmHg.  A midline longitudinal incision was made centered along the patella extending from the superior pouch to the tibial tubercle.  Via a sharp dissection, incision was carried down to the subcutaneous tissue.  There was abundant adipose tissue, which the patient was overweighed at approximately 270 pounds.  First layer of capsule was incised in the midline.  A medial parapatellar  incision was then made with the Bovie. The joint was entered.  There was a minimal clear yellow joint effusion. The patella was then everted at 180 degrees laterally and the knee flexed to 90 degrees.  There was abundant beefy purplish red synovitis. This was resected with a rongeur and the Bovie.  There was near complete loss of articular cartilage along the weightbearing surface of the medial femoral condyle and medial tibial plateau.  It was the varus position of the knee, but it was not fixed.  There were moderate osteophytes noted along the medial lateral femoral condyle.  We then measured a standard femoral component.  The external tibial guide was then applied.  An osteotomy was made along the proximal tibia transversely at about a 7-degree angle of posterior declination.  The Z retractors were inserted medially and laterally to protect the soft tissue.  The standard femoral jig was then applied and then cuts were made in the femur anteriorly and posteriorly.  I did use initially a 12.5-mm bridging bearing spacer and then tried 15-17.  I felt that I was a little loose and then checked the medial ligaments and felt that there was at least a partial injury to the medial collateral ligament, so I eventually sutured the ligament in the superficial  capsule and the deep capsule medially with a Mitek anchor and 0 Ethibond suture, and that had a very stable construct, so we proceeded with the final cuts on the femur using the 17.5-mm bridging bearing.  Using a 4-degree distal femoral valgus cut, flexion/extension gaps were perfectly symmetrical at 17.5.  At that point, did not feel like there was any opening with the varus or valgus stress.  We checked our alignment with every one of our cuts and thought that the alignment was perfect along the weightbearing line to the center of the ankle.  Lamina spreader was inserted medially and laterally to remove medial lateral menisci and ACL and  PCL.  Retractors were then placed along the tibia.  The tibia was then advanced anteriorly.  A #3 tibial tray was identified, it was then pinned in place.  The center cut was made followed by the keeled cut with the tray in place and with a 17.5-mm bridging bearing in place. The trial standard femoral component was then applied.  At that point, we completed repair of the deep medial collateral ligament and capsule with the Mitek anchor and then supplemented that with the 0 Ethibond suture.  It did not stretch, and we had perfect stability with varus and valgus stress, and very nice alignment of the polyethylene bearing with flexion/extension.  There was a negative anterior drawer sign.  The patella was then prepared by removing 10 mm of bone leaving 13 mm of patella thickness.  The three pegged patella jig was then applied. Three holes made and the trial patella was inserted.  There was a tight band at the very midportion of the patella.  This was just inside over an area about an inch and at that point, the patella tracked midline.  The trial components were then removed.  The joint was copiously irrigated with saline solution.  We injected 0.25% Marcaine with epinephrine along the deep capsule.  While waiting for the final component to be secured with the polymethyl methacrylate, we then initially inserted the tibial tray with polymethyl methacrylate, removed any extraneous methacrylate from its periphery.  The 17.5-mm bridging bearing was then inserted followed by the cemented femur.  Again any extraneous methacrylate was removed from around its periphery.  With the knee in extension, the patella was applied with methacrylate and extraneous methacrylate removed from its periphery.  With the knee in extension and vertical compression and with a patellar clamp, we waited approximately 16 minutes at which point the methacrylate was perfectly mature and hard.  The joint was inspected  without evidence of further loose methacrylate.  We then checked the stability, thought we had excellent alignment and varus-valgus stability, negative anterior drawer sign.  At that point, the tourniquet was deflated.  We had immediate capillary refill to the joint surface.  Wound was irrigated with saline solution.  We did not have significant bleeding and therefore a Hemovac was not inserted.  The deep capsule was closed with interrupted #1 Ethibond.  Superficial capsule with a running 0 and then 2-0 Vicryl, subcu with 3-0 Monocryl, skin closed with skin clips.  Sterile bulky dressing was applied followed by the patient's support stocking.  The patient was then awakened, and returned to the post anesthesia recovery room without problems.    Claude Manges. Cleophas Dunker, M.D.    PWW/MEDQ  D:  06/17/2010  T:  06/17/2010  Job:  [id number]  Electronically Signed by Norlene Campbell M.D. on 07/23/2010 01:41:00 PM

## 2010-07-25 NOTE — Op Note (Signed)
[place]  Patient:    PANZY, BUBECK Visit Number: [id number] MRN: [id number]          Service Type: DSU Location: [facility] Attending Physician:  Retia Passe Dictated by:   Vania Rea. Warren Danes, D.D.S. Proc. Date: 12/14/00 Admit Date:  12/14/2000                             Operative Report  PREOPERATIVE DIAGNOSES: 1. Retained impacted third molar teeth #1, 16, 17, 32. 2. History of hypertension. 3. History of chronic psychotropic drug use. 4. Obesity.  POSTOPERATIVE DIAGNOSES: 1. Retained impacted third molar teeth #1, 16, 17, 32. 2. History of hypertension. 3. History of chronic psychotropic drug use. 4. Obesity.  PROCEDURE:  Removal of third molar teeth #1, 16, 17, and 32.  ANESTHESIA:  General via oroendotracheal intubation.  SURGEON: Vania Rea. Warren Danes, D.D.S.  FIRST ASSISTANT:  [place].  ESTIMATED BLOOD LOSS:  Approximately 25 cc.  FLUID REPLACEMENT:  Approximately 1000 cc crystalloid solution.  COMPLICATIONS:  None apparent.  INDICATION FOR PROCEDURE:  Mr. Yerby is a 60 year old female, who was referred to my office for removal of her third molar teeth.  The patient has had chronic pain and infection with these teeth.  The patient presents with a history of hypertension, psychotropic drug use, and large tongue, which made removal of the teeth in an office situation difficult.  Due to her medical history, it was recommended that the teeth be removed under general anesthesia with intubation for the airway to be maintained.  DESCRIPTION OF PROCEDURE:  On December 14, 2000, Ms. Barcelo was taken to [facility] day surgical center, where she was placed on the operating room table in a supine position.  Following successful oroendotracheal intubation and general anesthesia, the patients face, neck, and oral cavity were prepped and draped in the usual sterile operating room fashion.  The hypopharynx was suctioned free  of fluids and secretions, and a moistened two-inch vaginal pack was placed as a throat pack.  Attention was then directed intraorally, where approximately 10 cc of 0.5% Xylocaine containing 1:200,000 epinephrine was infiltrated in the maxillary posterior superior alveolar nerve distributions bilaterally, corresponding palatal soft tissues, and the right and left inferior alveolar neurovascular regions.  Attention was then directed toward the maxillary arch, where a full-thickness mucoperiosteal incision was made around the necks of the teeth using a #15 Bard Parker blade.  A full-thickness flap was then elevated laterally, exposing teeth #1 and 16.  The teeth were then subluxated from the alveolus using a 301 dental elevator and an 11A dental elevator and removed from the oral cavity using a 150 dental forceps. The bony margins were then smoothed and contoured using a small osseous file. The areas were then thoroughly irrigated with sterile saline irrigating solution and suctioned.  Mucoperiosteal margins were then approximated and sutured in an interrupted fashion, using 4-0 chromic suture material on an FS2 needle.  In a similar fashion, lower third molar teeth #17 and 32 were removed as well.  The oral cavity was then thoroughly irrigated and suctioned.  Throat pack was removed and the hypopharynx suctioned free of fluids and secretions.  The patient was then allowed to awaken from the anesthesia and taken to the recovery room, where she tolerated the procedure well without apparent complication. Dictated by:   Vania Rea. Warren Danes, D.D.S. Attending Physician:  Retia Passe DD:  12/14/00 TD:  12/14/00  Job: [id number]

## 2010-07-25 NOTE — Op Note (Signed)
NAMEWYONIA, FONTANELLA                ACCOUNT NO.:  [id number]   MEDICAL RECORD NO.:  [id number]          PATIENT TYPE:  AMB   LOCATION:  NESC                         FACILITY:  [facility]   PHYSICIAN:  Martina Sinner, MD DATE OF BIRTH:  1950/06/22   DATE OF PROCEDURE:  04/06/2006  DATE OF DISCHARGE:                               OPERATIVE REPORT   PREOPERATIVE DIAGNOSIS:  Stress incontinence.   POSTOPERATIVE DIAGNOSIS:  Stress incontinence; small meatal laceration.   PROCEDURE:  Cystoscopy, failed insertion of sling, plus repair of meatal  laceration.   SURGEON:  Scott A. MacDiarmid, M.D.   Ms. Soha Thorup had consented to a sling.  She has mild urge and  primarily stress incontinence.  She had failed physical therapy.  She  was booked to have a SPARC sling cystourethropexy.   Ms. Hachey was prepped and draped in the usual fashion.  Extra care was  taken to minimize the risk of compartment syndrome and neuropathy.  Because of her body habitus, I made certain she was down quite far on  the end of the bed.  She is quite obese which effects currently labia  majora and perivaginal and buttock area.   I had to use the long Steiner retractors, since my usual long weighted  vaginal speculum would just enter the introitus approximately 1 inch.  I  used a number of maneuvers to maximize exposure.  I had to use a [employer]  and two Allis clamps clamped over the [facility] retractor to hold it in as  well as used one of the Pharmacologist.  I used my usual curved  cerebellar which fit in very nicely but would only hold the middle  aspect of the introitus open and not the area near her urethral meatus.  For this reason, I placed one 2-0 silk suture through the labia majora  bilaterally to her perivaginal skin area.  She did not have a long labia  minora.  In spite of this, exposure was less than ideal, so I used  another curved cerebellar turned upside down to open up the introitus.  This  made a big difference, and I could identify her urethra and high  small cystocele.   I had made two 1-cm incisions one fingerbreadth above the symphysis  pubis and 1.5 cm lateral.  I then used approximately 6 or 7 mL of an  epinephrine lidocaine mixture overlying the mid-urethra and also  injected laterally towards the urethrovesical angle bilaterally.  Using  a long 15 blade, I was able to make a deep incision and developed a nice  pubocervical flap, nicely exposing the palpable urethrovesical angle  bilaterally.   With the bladder emptied, I passed a left Stamey needle on top of and  along the back of the symphysis pubis, delivering it out through the  incision.  I did the same maneuver on the right side.  Throughout the  case I consciously tried to make certain that I was inserting my finger  into the correct incision, since seeing this incision was more  challenging due to her anatomy.  I passed the right needle using the  same technique uneventfully.  When I visually looked in the vagina, the  urethral meatus appeared to be distorted.  There was a little bit of  bleeding around the meatus.  I thought that the right needle may have  been passed too distal near the meatus and distal urethra, so I removed  it.   I then cystoscoped the patient.  There was no entrance into the bladder  on either side even with deflecting the needle.  The urethra itself  looked normal, and cystoscopically, it was difficult to say for certain  if the urethral meatus and mucosa was opened distally at 7 o'clock.   With some suction and good lighting, I did see a small laceration at 7  o'clock.  I reinserted the Foley catheter.  I closed the laceration with  running 4-0 Vicryl on an RB1.  The bleeding was stopped.   Visually and also endoscopically I could see that the laceration once  closed retracted up into the urethra approximately 6 or 7 mm.  Endoscopically, this was away from the mid-urethra but  very close.  I  really thought it was safest not to place a synthetic sling in the mid-  urethra since I thought that erosion right into the urethra would be  higher.  I did not want to use a nonsynthetic sling because of her body  habitus, and it would be very difficult to break through the endopelvic  fascia bilaterally.  It would also jeopardize safety if she had a lot of  bleeding, etc.   I recystoscoped the patient, and there was efflux of indigo carmine from  both ureteral orifices.  The bladder and urethra looked fine.  The  distal laceration at 7 o'clock had stopped bleeding.   I decided not to proceed further, and I removed the retractors and the  labial silk sutures.  She was having some bleeding on the left side, and  it almost looked like she going to develop a labial hematoma, so I held  pressure for approximately seven or eight minutes.  There was no  hematoma.  Using cautery, I stopped a little bit of superficial bleeding  where the silk had passed through the left labia.  I checked her medical  records, and she may still have been on Celebrex and possibly a vitamin  of some sort.   A 2-0 Vicryl on a CT1 was used to close to the vaginal incision.  Bleeding was minimal, and total blood loss was less than 100 mL.  A  vaginal pack was inserted in the vagina temporarily as well as one near  the introitus to give some pressure on the labia majora.   Leg position was good at the end of the case, and the catheter was  draining blue urine.   Dermabond was applied to the skin incisions.   I feel badly that I was not able to proceed with the sling, but I do not  think it was safe.   If she chooses to repeat the procedure, I will try putting her legs in a  little bit higher lithotomy, but I believe I will have to use the exact  same retractors and do the very best I can to pass the needle safely.  I will also review her medication list prior to surgery.            ______________________________  Martina Sinner, MD  Electronically Signed  SAM/MEDQ  D:  04/06/2006  T:  04/06/2006  Job:  [id number]

## 2010-08-12 ENCOUNTER — Inpatient Hospital Stay (HOSPITAL_COMMUNITY)
Admission: RE | Admit: 2010-08-12 | Discharge: 2010-08-16 | DRG: 467 | Disposition: A | Discharge status: Home Health | Payer: 59 | Source: Ambulatory Visit | Attending: Orthopaedic Surgery | Admitting: Orthopaedic Surgery

## 2010-08-12 DIAGNOSIS — I10 Essential (primary) hypertension: Secondary | ICD-10-CM | POA: Diagnosis present

## 2010-08-12 DIAGNOSIS — K560 Paralytic ileus: Secondary | ICD-10-CM | POA: Diagnosis not present

## 2010-08-12 DIAGNOSIS — K3184 Gastroparesis: Secondary | ICD-10-CM | POA: Diagnosis present

## 2010-08-12 DIAGNOSIS — E1149 Type 2 diabetes mellitus with other diabetic neurological complication: Secondary | ICD-10-CM | POA: Diagnosis present

## 2010-08-12 DIAGNOSIS — D62 Acute posthemorrhagic anemia: Secondary | ICD-10-CM | POA: Diagnosis not present

## 2010-08-12 DIAGNOSIS — Z96659 Presence of unspecified artificial knee joint: Secondary | ICD-10-CM

## 2010-08-12 DIAGNOSIS — T84029A Dislocation of unspecified internal joint prosthesis, initial encounter: Principal | ICD-10-CM | POA: Diagnosis present

## 2010-08-12 LAB — DIFFERENTIAL
Basophils Relative: 0 % (ref 0–1)
Eosinophils Absolute: 0.4 10*3/uL (ref 0.0–0.7)
Lymphs Abs: 2 10*3/uL (ref 0.7–4.0)
Neutrophils Relative %: 64 % (ref 43–77)

## 2010-08-12 LAB — COMPREHENSIVE METABOLIC PANEL
Alkaline Phosphatase: 122 U/L — ABNORMAL HIGH (ref 39–117)
BUN: 13 mg/dL (ref 6–23)
Calcium: 9.6 mg/dL (ref 8.4–10.5)
Glucose, Bld: 107 mg/dL — ABNORMAL HIGH (ref 70–99)
Total Protein: 6.5 g/dL (ref 6.0–8.3)

## 2010-08-12 LAB — URINALYSIS, ROUTINE W REFLEX MICROSCOPIC
Nitrite: NEGATIVE
Specific Gravity, Urine: 1.012 (ref 1.005–1.030)
pH: 6 (ref 5.0–8.0)

## 2010-08-12 LAB — SURGICAL PCR SCREEN
MRSA, PCR: NEGATIVE
Staphylococcus aureus: NEGATIVE

## 2010-08-12 LAB — GLUCOSE, CAPILLARY: Glucose-Capillary: 93 mg/dL (ref 70–99)

## 2010-08-12 LAB — CBC
Platelets: 241 10*3/uL (ref 150–400)
RBC: 4.45 MIL/uL (ref 3.87–5.11)
WBC: 8.5 10*3/uL (ref 4.0–10.5)

## 2010-08-12 LAB — URINE MICROSCOPIC-ADD ON

## 2010-08-12 LAB — PROTIME-INR: Prothrombin Time: 13 seconds (ref 11.6–15.2)

## 2010-08-13 LAB — GLUCOSE, CAPILLARY
Glucose-Capillary: 121 mg/dL — ABNORMAL HIGH (ref 70–99)
Glucose-Capillary: 125 mg/dL — ABNORMAL HIGH (ref 70–99)
Glucose-Capillary: 137 mg/dL — ABNORMAL HIGH (ref 70–99)

## 2010-08-13 LAB — BASIC METABOLIC PANEL
BUN: 13 mg/dL (ref 6–23)
GFR calc non Af Amer: >60 mL/min (ref >60)
Potassium: 4.4 mEq/L (ref 3.5–5.1)

## 2010-08-13 LAB — URINALYSIS, ROUTINE W REFLEX MICROSCOPIC
Glucose, UA: NEGATIVE mg/dL
Ketones, ur: NEGATIVE mg/dL
pH: 5.5 (ref 5.0–8.0)

## 2010-08-13 LAB — URINE MICROSCOPIC-ADD ON

## 2010-08-13 LAB — CBC
MCV: 85.1 fL (ref 78.0–100.0)
Platelets: 227 10*3/uL (ref 150–400)
RDW: 15.1 % (ref 11.5–15.5)
WBC: 10 10*3/uL (ref 4.0–10.5)

## 2010-08-13 LAB — HEMOGLOBIN A1C: Mean Plasma Glucose: 131 mg/dL — ABNORMAL HIGH (ref <117)

## 2010-08-14 ENCOUNTER — Inpatient Hospital Stay (HOSPITAL_COMMUNITY): Payer: 59

## 2010-08-14 LAB — CBC
HCT: 24.5 % — ABNORMAL LOW (ref 36.0–46.0)
MCH: 28.1 pg (ref 26.0–34.0)
MCV: 86 fL (ref 78.0–100.0)
Platelets: 154 10*3/uL (ref 150–400)
RBC: 2.85 MIL/uL — ABNORMAL LOW (ref 3.87–5.11)
RDW: 14.9 % (ref 11.5–15.5)

## 2010-08-14 LAB — GLUCOSE, CAPILLARY

## 2010-08-14 LAB — BASIC METABOLIC PANEL
BUN: 12 mg/dL (ref 6–23)
CO2: 29 mEq/L (ref 19–32)
Chloride: 101 mEq/L (ref 96–112)
Creatinine, Ser: 0.73 mg/dL (ref 0.4–1.2)
Potassium: 4.1 mEq/L (ref 3.5–5.1)

## 2010-08-14 LAB — URINE CULTURE
Colony Count: NO GROWTH
Culture: NO GROWTH

## 2010-08-15 ENCOUNTER — Inpatient Hospital Stay (HOSPITAL_COMMUNITY): Payer: 59

## 2010-08-15 LAB — GLUCOSE, CAPILLARY
Glucose-Capillary: 115 mg/dL — ABNORMAL HIGH (ref 70–99)
Glucose-Capillary: 108 mg/dL — ABNORMAL HIGH (ref 70–99)

## 2010-08-15 LAB — URINE MICROSCOPIC-ADD ON

## 2010-08-15 LAB — TYPE AND SCREEN
ABO/RH(D): O POS
Antibody Screen: NEGATIVE
Unit division: 0
Unit division: 0

## 2010-08-15 LAB — URINALYSIS, ROUTINE W REFLEX MICROSCOPIC
Glucose, UA: NEGATIVE mg/dL
Protein, ur: NEGATIVE mg/dL
pH: 5.5 (ref 5.0–8.0)

## 2010-08-15 LAB — CBC
HCT: 30.9 % — ABNORMAL LOW (ref 36.0–46.0)
MCH: 27.8 pg (ref 26.0–34.0)
MCV: 84.2 fL (ref 78.0–100.0)
Platelets: 164 10*3/uL (ref 150–400)
RDW: 14.5 % (ref 11.5–15.5)

## 2010-08-15 LAB — BASIC METABOLIC PANEL
BUN: 8 mg/dL (ref 6–23)
CO2: 28 mEq/L (ref 19–32)
Calcium: 8.4 mg/dL (ref 8.4–10.5)
Creatinine, Ser: 0.6 mg/dL (ref 0.4–1.2)
GFR calc Af Amer: >60 mL/min (ref >60)

## 2010-08-16 LAB — URINE CULTURE: Culture: NO GROWTH

## 2010-08-16 LAB — COMPREHENSIVE METABOLIC PANEL
ALT: 18 U/L (ref 0–35)
AST: 22 U/L (ref 0–37)
Albumin: 2.6 g/dL — ABNORMAL LOW (ref 3.5–5.2)
Alkaline Phosphatase: 82 U/L (ref 39–117)
CO2: 28 mEq/L (ref 19–32)
Chloride: 99 mEq/L (ref 96–112)
GFR calc non Af Amer: >60 mL/min (ref >60)
Potassium: 4 mEq/L (ref 3.5–5.1)
Sodium: 139 mEq/L (ref 135–145)
Total Bilirubin: 0.4 mg/dL (ref 0.3–1.2)

## 2010-08-16 LAB — GLUCOSE, CAPILLARY
Glucose-Capillary: 94 mg/dL (ref 70–99)
Glucose-Capillary: 100 mg/dL — ABNORMAL HIGH (ref 70–99)

## 2010-08-16 LAB — CBC
HCT: 31.1 % — ABNORMAL LOW (ref 36.0–46.0)
Hemoglobin: 10.5 g/dL — ABNORMAL LOW (ref 12.0–15.0)
MCHC: 33.8 g/dL (ref 30.0–36.0)
RBC: 3.7 MIL/uL — ABNORMAL LOW (ref 3.87–5.11)
WBC: 6.9 10*3/uL (ref 4.0–10.5)

## 2010-08-16 LAB — BODY FLUID CULTURE
Culture: NO GROWTH
Gram Stain: NONE SEEN

## 2010-08-17 LAB — ANAEROBIC CULTURE

## 2010-09-17 NOTE — Op Note (Signed)
Beth [name], Beth [name]                ACCOUNT NO.:  [id number]  MEDICAL RECORD NO.:  [id number]  LOCATION:  5011                         FACILITY:  [facility]  PHYSICIAN:  Beth [name]. 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  PROCEDURE:  Revision of left total knee replacement to a hinge prosthesis.  SURGEON:  Beth [name]. Beth Dunker, MD  ASSISTANT:  Beth [name]. Beth Kluver, MD and Beth [name]. Petrarca, PA-C  ANESTHESIA:  General with supplemental femoral nerve block.  COMPONENTS:  DePuy size 3 cemented MBT revision rotating platform tibial tray with a 17 mm long x 16 mm stemmed flute , a left extra small S-ROM Noiles femoral rotating hinged femoral component with an universal femoral fully pore-coated sleeve, a 17-mm x 16-mm universal fluted stem, an extra small 21-mm tibial hinged insert with a metal axle and a oval polyethylene dome patella, all was secured with polymethyl methacrylate using the SmartSet GHV gentamicin bone cement.  PROCEDURE:  Beth [name] was met in the holding area, I marked her right and left as the appropriate operative site.  She was then transported to room #15 and placed under general anesthesia without difficulty.  She did receive a preoperative femoral nerve block.  The nursing staff inserted a Foley catheter.  Urine was clear.  Tourniquet was then applied to the left lower extremity.  The leg was prepped with Betadine scrub and DuraPrep from the tourniquet to the midfoot.  Sterile draping was performed.  She did receive a gram of IV vancomycin preoperatively.  Extremity was elevated and Esmarch exsanguinated with a proximal tourniquet at 360 mmHg.  The prior midline  longitudinal incision was utilized and elliptically excised.  Abundant adipose tissue was then incised with the Bovie.  The prior medial parapatellar incision of the deep capsule was identified and incised sharply through the previously inserted Ethibond sutures. The sutures were removed with the rongeur.  The joint was then entered. There was a serosanguineous effusion.  Cultures were sent.  The incision was then opened proximal to distal.  The patella was everted at 180 degrees.  The previously inserted polyethylene bearing had rotated 90 degrees.  This was then reduced and then I checked the stability and it opened up widely medially.  There had been a partial injury to the medial collateral ligament that had been repaired and the patient had an injury postoperatively and apparently toward the repair.  Because of the instability, I elected to proceed with a hinged prosthesis.  Using a very thin oscillating saw blade, I removed the tibial component initially and then using osteotomes, it came off the methacrylate very nicely with no loss of bone.  I then carefully removed the previously inserted methacrylate with a small osteotome and mallet.  Reaming of the tibia was then performed, a hand reamer was then used to 16 mm and we measured a 75-mm long fluted stem with 16-mm wide.  With this in place, I  inserted the tibial cutting jig and then made a clean-up cut along the proximal tibia.  The trial tibial tray was then assembled and inserted. There appeared to be it seemed to be higher medially and laterally, so then I simply removed a little bit of the lateral bone, so now that the entire tibial trays have to flush on the joint surface.  I checked the alignment and thought it was excellent.  There was minimal loss of bone posteriorly of probably a 16th of an inch, otherwise, I had circumferential bone beneath the #3 tibial tray.  The femoral component was then removed in a similar  fashion using the small oscillating saw and an osteotome and then we were able to remove it with part of the methacrylate.  There was a little if any loss of bone.  I then carefully removed the remaining methacrylate.  The femur was prepared using the femoral jigs and the clean-up cuts.  We measured and we hand reamed a 16-mm canal and then used a femoral porous coated sleeve.  After all the appropriate cuts were made including the tapered cuts, the final trial component was then inserted and impacted. I thought we had very nice position.  With the tibial tray in place, we then trialed several polyethylene bearings including the 18 and then the 21 mm, and we thought the 21 mm gave [place] perfect stability without hyperextension.  The patella was then removed in similar fashion, using the small oscillating saw and the osteotome, it was removed with a little if any loss of bone.  Clean-up cut was made just to remove any methacrylate or any rough surfaces.  Three holes were drilled.  We trialed the patella polyethylene button and through full range of motion appeared to tracked the midline without clunking or subluxation.  We initially allowed the tourniquet to be up for 2 hours, then we released it for approximately 17 minutes during which time we irrigated the wound with jet saline and gross bleeders were Bovie coagulated.  At about 17 minutes, we then elevated the leg and then Esmarch exsanguinated with the proximal tourniquet again at 360 mmHg.  The gentamicin impregnated methacrylate was then mixed.  We initially inserted the tibial component as it was the final component was assembled on the table.  The methacrylate was inserted and it was impacted nicely on the tibial plateau.  Any extraneous methacrylate was removed from around the periphery.  In similar fashion, we inserted the femoral component after final assembly on the table.  Any extraneous methacrylate was removed from its  periphery as well.  We then again reinserted the trial tibial polyethylene component.  Again felt that 21 gave [place] the most stability without hyperextension, then the final 21 mm polyethylene bearing was then inserted followed by the metallic axle and the plastic circular rod nuts.  The patella was then applied with methacrylate as well and the patellar clamp and approximately 16 minutes of methacrylate was hardened.  We placed the knee through full range of motion with excellent stability from nice motion without any subluxation of the patella.  Tourniquet was deflated.  Gross bleeders were Bovie coagulated.  Hemovac was inserted. The deep capsule was then closed with interrupted #1 Ethibond. Superficial capsule closed with running 0 Vicryl, subcu with 0-0 and 2-0 Vicryl and 3-0 Monocryl, and skin closed with skin clips.  Sterile bulky dressing was applied.  The patient tolerated procedure well without complications.  The second tourniquet was up just under  an hour.     Beth [name]. Beth [name], M.D.     PWW/MEDQ  D:  08/12/2010  T:  08/13/2010  Job:  [id number]  Electronically Signed by Beth [name] M.D. on 09/17/2010 11:41:22 AM

## 2010-10-03 NOTE — Discharge Summary (Signed)
Beth [name], Beth [name]                ACCOUNT NO.:  [id number]  MEDICAL RECORD NO.:  [id number]  LOCATION:  5011                         FACILITY:  [facility]  PHYSICIAN:  Claude Manges. 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  DISCHARGE DIAGNOSES: 1. 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 2. Acute blood loss anemia. 3. Diabetes mellitus.  PROCEDURE:  Revision left total knee arthroplasty.  HISTORY:  Beth [name] is a 60 year old white female who had undergone a left total knee arthroplasty.  She has some instability and apparently had dislocated the polyethylene bridge in the knee.  At that time, Dr. Dion Saucier took her to the operating room and apparently reduced it.  She continued to have problems with the knee, especially with that of pain and swelling.  It was noted several weeks later that she did re-spoke poly; and at this time, she was indicated now for revision total knee arthroplasty.  HOSPITAL COURSE:  A 60 year old white female admitted on August 12, 2010. After appropriate laboratory studies were obtained as well as 1 g of vancomycin IV on-call to the operating room, she was taken to the operating room where she underwent a revision left total knee arthroplasty by Dr. Norlene Campbell and assisted by Dr. Erasmo Leventhal and Oris Drone. Petrarca, PAC.  This involved a DePuy size 3 cemented MBT revision rotating platform tibial tray with a 17 mm x 16 mm stem flute, left extra small SROM __________ femoral rotating hinged femoral component with a universal femoral 48  porous-coated sleeve, 17 mm x 16 mm universal fluted stem, an extra small 21-mm tibial hinged insert with a metal axle and an oval polyethylene dome patella, all secured with polymethyl methacrylate using  SmartSet GHV gentamicin bone cement.  She tolerated the procedure well.  She was continued on vancomycin 1 g IV q.12 h. x3 doses because of this being a  revision.  She started on Xarelto at 8 a.m. on August 13, 2010, postop day #1.  She was started on Tylenol 1 g IV q.6 hours x4 doses at 1 g p.o. q.6 hours.  She was started on Dilaudid 2 mg p.o. q.3 hours p.r.n. pain.  CPM was placed 0- 60 degrees for 6-8 hours per day.  Her metformin was held for 36 hours. PT for partial weightbearing 50% body weight.  She was allowed out of bed to chair the following day.  She was weaned off her O2, keeping her sats greater than 92%.  Continued with a PCA at this time.  Foley was discontinued.  Incentive q.1 hour while awake.  IV to [facility].  Home Health PT and CPM were ordered.  She did have some difficulty with hypertension and her hydrochlorothiazide and Benicar were held on August 13, 2010.  She did have gastroparesis , at least that is the  diagnosis that was felt and then she was started on Reglan 10 mg p.o. q.6 hours and at bedtime. IV fluids were taken to 80  mL per hour due to nausea and vomiting.  She was given a bolus of 250 mL on August 14, 2010, at 12:35 a.m.  Then, the rate was increased to 125 mL/hour.  On Jul 14, 2010, we replaced a Foley to give [place] a better idea of her I and O's.  IV was taken up to 150 mL/hour.  A KUB and x-ray to rule out ileus was ordered.  Strict I and O's was ordered.  She was type and crossed for 2 units of packed cells on August 14, 2010, these were given over 2-3 hours with 20 of Lasix p.o. between the units.  On August 14, 2010, her PCA was discontinued.  Saline lock was placed after the blood was given.  A Dulcolax suppository was ordered after the blood.  On August 14, 2010, she had advanced to full liquids as tolerated.  On August 15, 2010, repeat KUB was ordered.  Also, a UA C and S was ordered.  Started on K-Dur 40 mg p.o. x1.  On August 16, 2010, she was given MiraLAX 17 g in 8 ounces  of fluid.  Apparently, she was to the point where she wanted to be discharged and so we obliged, then discharged her on August 16, 2010, and told that if she has any problems, go to the emergency room, otherwise we will follow her back up in 2 weeks for recheck evaluation.  LABORATORY DATA:  Admitted with hemoglobin of 12.2, hematocrit 37.4%, white count 8500, and platelets were 241,000.  Discharge hemoglobin 10.5, hematocrit 31.1%, white count 6900, platelets were 192,000.  The hemoglobin dropped to 8.0 on August 14, 2010, and that is when the blood was transfused.  Preop sodium 141, potassium 4.4, chloride 102, CO2 28, glucose 107, BUN 13, creatinine 0.80.  Laboratory studies on August 16, 2010, revealed sodium 139, potassium 4.0, chloride 99, CO2 28, glucose 101, BUN 6, creatinine 0.61.  Her potassium dropped to 3.2 on August 15, 2010.  GFR was greater than 60 throughout her hospital course.  Total protein preop 6.5, albumin 3.5, AST 23, ALT 20, ALP 122, bilirubin 0.4. Total protein on August 16, 2010, 5.9, albumin 2.6, AST 22, ALT 18, ALP 82, and bilirubin 0.4.  Hemoglobin A1c was 6.2.  Urinalysis on August 12, 2010, revealed small leukocyte esterase, epithelials few, 3-6 whites, 0-2 reds, and rare bacteria.  On August 13, 2010, there was a large amount of hemoglobin, small bilirubin, rare epithelials, 0-2 whites, 21-50 reds, rare bacteria with hyaline casts and mucus noted.  On August 15, 2010, revealed small leukocyte esterase with many epithelials, 3-6 whites, 3-6 reds, bacteria few, granular and hyaline casts were noted as well as calcium oxalate crystals and mucus.  She was O positive, antibody screen negative.  Transfused 2 units packed cells.  Synovial fluid showed no growth x3 days.  Urine cultures x2 showed no growth.  Anaerobic cultures of the fluid from the knee showed no growth.  She was negative for MRSA as well as staph aureus of the nose.  Radiographic studies of August 14, 2010, of the abdomen  showed gas distended in transverse colon, possibly related to ileus.  August 15, 2010, revealed slight decrease in mild ileus pattern.  DISCHARGE INSTRUCTIONS:  Advance her diet as tolerated as she was prior to hospitalization.  No lifting or driving for 6 weeks.  She may shower without dressing once there is no drainage.  Do not wash over the wound. If drainage remains, cover wound with  plastic wrap and then shower.  50% weightbearing as in taught physical therapy.  She is not interested in using TED hose.  CPM 0-60 degrees per 6-8 hours per day, increasing by 5 degrees per day and using for at least 3 weeks.  Change the dressing on Monday and then change the dressing daily with sterile 4x4 gauze dressing and apply TED hose.  Clean the incision with alcohol prior to redressing.  She needs to be seen on August 25, 2010, and she will call the office before that appointment.  She was discharged in improved condition.     [name], P.A.-C.   ______________________________ Claude Manges. Cleophas Dunker, M.D.    BDP/MEDQ  D:  09/02/2010  T:  09/03/2010  Job:  [id number]  Electronically Signed by Jacqualine Code P.A.-C. on 09/24/2010 08:14:13 AM Electronically Signed by Norlene Campbell M.D. on 10/03/2010 04:51:34 PM

## 2010-12-16 LAB — CBC
HCT: 37.3
MCV: 82.5
Platelets: 200
RBC: 4.53
WBC: 9.7

## 2010-12-16 LAB — APTT: aPTT: 26

## 2010-12-16 LAB — TYPE AND SCREEN
ABO/RH(D): O POS
Antibody Screen: NEGATIVE

## 2010-12-16 LAB — PROTIME-INR: Prothrombin Time: 13

## 2010-12-16 LAB — BASIC METABOLIC PANEL
BUN: 14
Chloride: 104
Creatinine, Ser: 0.87
GFR calc Af Amer: >60
GFR calc non Af Amer: >60
Potassium: 4

## 2010-12-25 LAB — PROTIME-INR
INR: 0.9
Prothrombin Time: 12.4

## 2010-12-25 LAB — BASIC METABOLIC PANEL
CO2: 30
Calcium: 8.7
Creatinine, Ser: 0.85
GFR calc non Af Amer: >60
Glucose, Bld: 165 — ABNORMAL HIGH

## 2010-12-25 LAB — CBC
MCHC: 32.5
Platelets: 214
RDW: 15.5 — ABNORMAL HIGH

## 2010-12-25 LAB — APTT: aPTT: 25

## 2011-11-10 LAB — HM PAP SMEAR: HM Pap smear: NEGATIVE

## 2012-03-09 HISTORY — PX: TOTAL KNEE REVISION: SHX996

## 2012-03-21 ENCOUNTER — Ambulatory Visit (HOSPITAL_COMMUNITY)
Admission: RE | Admit: 2012-03-21 | Discharge: 2012-03-21 | Disposition: A | Discharge status: Home / Self Care | Payer: 59 | Source: Ambulatory Visit | Attending: Internal Medicine | Admitting: Internal Medicine

## 2012-03-21 ENCOUNTER — Other Ambulatory Visit (HOSPITAL_COMMUNITY): Payer: Self-pay | Admitting: Internal Medicine

## 2012-03-21 DIAGNOSIS — Z0000 Encounter for general adult medical examination without abnormal findings: Secondary | ICD-10-CM | POA: Insufficient documentation

## 2012-03-21 DIAGNOSIS — F17200 Nicotine dependence, unspecified, uncomplicated: Secondary | ICD-10-CM | POA: Insufficient documentation

## 2012-03-21 DIAGNOSIS — J984 Other disorders of lung: Secondary | ICD-10-CM | POA: Insufficient documentation

## 2012-03-21 DIAGNOSIS — I10 Essential (primary) hypertension: Secondary | ICD-10-CM

## 2012-06-02 ENCOUNTER — Encounter: Payer: Self-pay | Admitting: Nurse Practitioner

## 2012-06-02 DIAGNOSIS — E119 Type 2 diabetes mellitus without complications: Secondary | ICD-10-CM

## 2012-06-02 DIAGNOSIS — I10 Essential (primary) hypertension: Secondary | ICD-10-CM | POA: Insufficient documentation

## 2012-06-02 DIAGNOSIS — F17210 Nicotine dependence, cigarettes, uncomplicated: Secondary | ICD-10-CM

## 2012-06-02 DIAGNOSIS — N951 Menopausal and female climacteric states: Secondary | ICD-10-CM

## 2012-06-16 DIAGNOSIS — Z96659 Presence of unspecified artificial knee joint: Secondary | ICD-10-CM | POA: Insufficient documentation

## 2012-06-16 DIAGNOSIS — F17200 Nicotine dependence, unspecified, uncomplicated: Secondary | ICD-10-CM | POA: Insufficient documentation

## 2012-06-17 DIAGNOSIS — D509 Iron deficiency anemia, unspecified: Secondary | ICD-10-CM | POA: Insufficient documentation

## 2012-08-04 ENCOUNTER — Encounter: Payer: Self-pay | Admitting: Cardiology

## 2012-08-25 ENCOUNTER — Encounter: Payer: Self-pay | Admitting: Cardiology

## 2012-09-29 ENCOUNTER — Encounter: Payer: Self-pay | Admitting: Cardiology

## 2012-09-29 ENCOUNTER — Ambulatory Visit (INDEPENDENT_AMBULATORY_CARE_PROVIDER_SITE_OTHER): Payer: 59 | Admitting: Cardiology

## 2012-09-29 VITALS — BP 119/80 | HR 90 | Ht 66.0 in | Wt 263.1 lb

## 2012-09-29 DIAGNOSIS — F17200 Nicotine dependence, unspecified, uncomplicated: Secondary | ICD-10-CM

## 2012-09-29 DIAGNOSIS — I10 Essential (primary) hypertension: Secondary | ICD-10-CM

## 2012-09-29 DIAGNOSIS — R0600 Dyspnea, unspecified: Secondary | ICD-10-CM

## 2012-09-29 NOTE — Patient Instructions (Addendum)
The current medical regimen is effective;  continue present plan and medications.  Your physician has requested that you have an exercise tolerance test in 2 months. For further information please visit [email or web address] Please also follow instruction sheet, as given.

## 2012-09-29 NOTE — Progress Notes (Signed)
HPI The patient presents as a new patient for evaluation of an elevated C-reactive protein and sedimentation rate. She has no cardiac history but has cardiovascular risk factors. In particular her CRP was 32.8. She denies any cardiovascular symptoms and has had no past testing. However, she does have a long smoking history. She gets dyspnea with exertion but she relates this to her weight and deconditioning. She is recovering from a knee surgery in April and walks with a cane. She denies any ongoing chest pressure, neck or arm discomfort. She doesn't have any palpitations, presyncope or syncope. She has no PND or orthopnea.  Allergies  Allergen Reactions  . Sulfa Antibiotics Shortness Of Breath    Also light headed, rash  . Codeine     And hydrocodone  . Penicillins Rash    Current Outpatient Prescriptions  Medication Sig Dispense Refill  . albuterol (PROVENTIL HFA;VENTOLIN HFA) 108 (90 BASE) MCG/ACT inhaler Inhale 2 puffs into the lungs every 6 (six) hours as needed for wheezing.      Marland Kitchen ALPRAZolam (NIRAVAM) 0.25 MG dissolvable tablet Take 0.25 mg by mouth at bedtime as needed for anxiety.      Marland Kitchen aspirin 81 MG tablet Take 81 mg by mouth daily.      . cholecalciferol (VITAMIN D) 1000 UNITS tablet Take 1,000 Units by mouth daily.      . ciclesonide (OMNARIS) 50 MCG/ACT nasal spray Place 2 sprays into both nostrils daily.      . Cyanocobalamin (VITAMIN B-12 PO) Take by mouth daily.      . cyclobenzaprine (FLEXERIL) 10 MG tablet Take 10 mg by mouth daily.      Marland Kitchen dextromethorphan-guaiFENesin (MUCINEX DM) 30-600 MG per 12 hr tablet Take 1 tablet by mouth every 12 (twelve) hours.      . fexofenadine (ALLEGRA) 180 MG tablet Take 180 mg by mouth daily.      . fluticasone (FLONASE) 50 MCG/ACT nasal spray Place 2 sprays into the nose daily.      . hyoscyamine (LEVBID) 0.375 MG 12 hr tablet       . Iron-Vit C-Vit B12-Folic Acid (IRON 100 PLUS PO) Take 100 mg by mouth daily.      . meloxicam (MOBIC)  15 MG tablet       . metFORMIN (GLUMETZA) 500 MG (MOD) 24 hr tablet Take 500 mg by mouth daily with breakfast.      . metoCLOPramide (REGLAN) 10 MG tablet Take 10 mg by mouth 4 (four) times daily.      . montelukast (SINGULAIR) 10 MG tablet Take 10 mg by mouth at bedtime.      Marland Kitchen olmesartan-hydrochlorothiazide (BENICAR HCT) 20-12.5 MG per tablet Take 1 tablet by mouth daily.      . pantoprazole (PROTONIX) 40 MG tablet Take 40 mg by mouth daily.      . simvastatin (ZOCOR) 80 MG tablet Take 80 mg by mouth at bedtime.      Marland Kitchen venlafaxine (EFFEXOR) 75 MG tablet Take 75 mg by mouth 3 (three) times daily.      . Vitamin D, Ergocalciferol, (DRISDOL) 50000 UNITS CAPS Take 50,000 Units by mouth 3 (three) times a week.      . zaleplon (SONATA) 10 MG capsule Take 10 mg by mouth at bedtime as needed.       No current facility-administered medications for this visit.    Past Medical History  Diagnosis Date  . Hiatal hernia 12/89  . IBS (irritable bowel syndrome) 12/89  .  Depression 2/90  . Hypertension 1/97  . Fibroids, intramural 01/24/2001    Confirmed by PUS  . Bell's palsy 01/1985  . Anxiety   . Menopausal state 04/2009    At risk for endo hyperplasia  . Obesity, Class III, BMI 40-49.9 (morbid obesity)     Past Surgical History  Procedure Laterality Date  . Colonoscopy  05/2005    Normal, recheck 10 yrs  . Total knee arthroplasty Left 2012  . Bladder suspension  03/2006, 07/2006, 01/2007    for stress incontinence    Family History  Problem Relation Age of Onset  . Hypertension Father   . Diabetes Maternal Grandfather   . Hypertension Paternal Grandfather     History   Social History  . Marital Status: Divorced    Spouse Name: N/A    Number of Children: N/A  . Years of Education: N/A   Occupational History  . Not on file.   Social History Main Topics  . Smoking status: Current Every Day Smoker -- 0.50 packs/day    Types: Cigarettes  . Smokeless tobacco: Never Used  .  Alcohol Use: Yes  . Drug Use: No  . Sexually Active: Not on file   Other Topics Concern  . Not on file   Social History Narrative  . No narrative on file    ROS:  Positive for reflux, nausea, constipation, leg cramps and joint pain.  Otherwise as stated in the HPI and negative for all other systems.  PHYSICAL EXAM BP 119/80  Pulse 90  Ht 5\' 6"  (1.676 m)  Wt 263 lb 1.9 oz (119.35 kg)  BMI 42.49 kg/m2 GENERAL:  Well appearing HEENT:  Pupils equal round and reactive, fundi not visualized, oral mucosa unremarkable NECK:  No jugular venous distention, waveform within normal limits, carotid upstroke brisk and symmetric, no bruits, no thyromegaly LYMPHATICS:  No cervical, inguinal adenopathy LUNGS:  Clear to auscultation bilaterally BACK:  No CVA tenderness CHEST:  Unremarkable HEART:  PMI not displaced or sustained,S1 and S2 within normal limits, no S3, no S4, no clicks, no rubs, no murmurs ABD:  Flat, positive bowel sounds normal in frequency in pitch, no bruits, no rebound, no guarding, no midline pulsatile mass, no hepatomegaly, no splenomegaly EXT:  2 plus pulses throughout, no edema, no cyanosis no clubbing SKIN:  No rashes no nodules NEURO:  Cranial nerves II through XII grossly intact, motor grossly intact throughout PSYCH:  Cognitively intact, oriented to person place and time   EKG:  Sinus rhythm, rate 59, axis within limits, intervals within normal limits, no acute ST-T wave changes.  09/29/2012  ASSESSMENT AND PLAN  ELEVATED CRP:  The patient does have cardiovascular risk factors. I would like to screen her with an exercise treadmill test. We will have to wait until her knee is a little bit better so we will schedule this for about 2 months from now. If she's unable to do this in the future the next screening tests that I might suggest would be coronary calcium score.   TOBACCO ABUSE:  We discussed a specific strategy for tobacco cessation.  (Greater than three minutes  discussing tobacco cessation.)  She does want to try Chantix.  We discussed all of the potential side effects and in particular I reviewed the [employer].  He has no depression.  He understands and wishes to try this medication.

## 2012-09-30 ENCOUNTER — Encounter: Payer: Self-pay | Admitting: Cardiology

## 2012-11-10 ENCOUNTER — Ambulatory Visit: Payer: Self-pay | Admitting: Certified Nurse Midwife

## 2012-11-22 LAB — HM MAMMOGRAPHY: HM MAMMO: NORMAL

## 2012-11-29 ENCOUNTER — Encounter: Payer: 59 | Admitting: Physician Assistant

## 2012-11-30 ENCOUNTER — Encounter: Payer: Self-pay | Admitting: Certified Nurse Midwife

## 2012-11-30 ENCOUNTER — Ambulatory Visit (INDEPENDENT_AMBULATORY_CARE_PROVIDER_SITE_OTHER): Payer: 59 | Admitting: Certified Nurse Midwife

## 2012-11-30 VITALS — BP 104/62 | HR 72 | Resp 16 | Ht 64.75 in | Wt 268.0 lb

## 2012-11-30 DIAGNOSIS — Z01419 Encounter for gynecological examination (general) (routine) without abnormal findings: Secondary | ICD-10-CM

## 2012-11-30 NOTE — Patient Instructions (Signed)

## 2012-11-30 NOTE — Progress Notes (Signed)
62 y.o. [id number] Divorced Caucasian Fe here for annual exam. Menopausal no HRT. Denies vaginal dryness or vaginal bleeding. Sees PCP every 3 months for diabetic management. Recent left knee surgery to stabilize replacement, doing well. PCP watching elevated sed. Rate. Otherwise no health issues today. Working on weight loss as activity will tolerate.  Patient's last menstrual period was 04/09/2009.          Sexually active: no  The current method of family planning is post menopausal status.    Exercising: yes  physical therapy Smoker:  yes  Health Maintenance: Pap:  11-10-11 neg MMG:  2014 Colonoscopy:  2014 polyps, repeat in 5 years BMD:   4/10 TDaP:  2007 Labs: none Self breast exam: done monthly   reports that she has been smoking Cigarettes.  She has been smoking about 0.50 packs per day. She has never used smokeless tobacco. She reports that she does not drink alcohol or use illicit drugs.  Past Medical History  Diagnosis Date  . Hiatal hernia 12/89  . IBS (irritable bowel syndrome) 12/89  . Depression 2/90  . Hypertension 1/97  . Fibroids, intramural 01/24/2001    Confirmed by PUS  . Bell's palsy 01/1985  . Anxiety   . Menopausal state 04/2009    At risk for endo hyperplasia  . Obesity, Class III, BMI 40-49.9 (morbid obesity)   . Diabetes     Past Surgical History  Procedure Laterality Date  . Colonoscopy  05/2005    Normal, recheck 10 yrs  . 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 Bladder suspension  03/2006, 07/2006, 01/2007    for stress incontinence  . Total knee revision  2014  . Tonsillectomy and adenoidectomy      Current Outpatient Prescriptions  Medication Sig Dispense Refill  . albuterol (PROVENTIL HFA;VENTOLIN HFA) 108 (90 BASE) MCG/ACT inhaler Inhale 2 puffs into the lungs every 6 (six) hours as needed for wheezing.      Marland Kitchen ALPRAZolam (NIRAVAM) 0.25 MG dissolvable tablet Take 0.25 mg by mouth at bedtime as needed for anxiety.      Marland Kitchen aspirin  81 MG tablet Take 81 mg by mouth daily.      . cholecalciferol (VITAMIN D) 1000 UNITS tablet Take 1,000 Units by mouth daily.      . ciclesonide (OMNARIS) 50 MCG/ACT nasal spray Place 2 sprays into both nostrils daily.      . Cyanocobalamin (VITAMIN B-12 PO) Take by mouth daily.      . cyclobenzaprine (FLEXERIL) 10 MG tablet Take 10 mg by mouth daily.      Marland Kitchen dextromethorphan-guaiFENesin (MUCINEX DM) 30-600 MG per 12 hr tablet Take 1 tablet by mouth every 12 (twelve) hours.      . fexofenadine (ALLEGRA) 180 MG tablet Take 180 mg by mouth daily.      . fluticasone (FLONASE) 50 MCG/ACT nasal spray Place 2 sprays into the nose daily.      . hyoscyamine (LEVBID) 0.375 MG 12 hr tablet       . Iron-Vit C-Vit B12-Folic Acid (IRON 100 PLUS PO) Take 100 mg by mouth daily.      . meloxicam (MOBIC) 15 MG tablet       . metFORMIN (GLUMETZA) 500 MG (MOD) 24 hr tablet Take 500 mg by mouth daily with breakfast.      . metoCLOPramide (REGLAN) 10 MG tablet Take 10 mg by mouth 4 (four) times daily.      Marland Kitchen  montelukast (SINGULAIR) 10 MG tablet Take 10 mg by mouth at bedtime.      Marland Kitchen olmesartan-hydrochlorothiazide (BENICAR HCT) 20-12.5 MG per tablet Take 1 tablet by mouth daily.      . pantoprazole (PROTONIX) 40 MG tablet Take 40 mg by mouth daily.      . simvastatin (ZOCOR) 80 MG tablet Take 80 mg by mouth at bedtime.      Marland Kitchen venlafaxine (EFFEXOR) 75 MG tablet Take 75 mg by mouth 3 (three) times daily.      . Vitamin D, Ergocalciferol, (DRISDOL) 50000 UNITS CAPS Take 50,000 Units by mouth 3 (three) times a week.      . zaleplon (SONATA) 10 MG capsule Take 10 mg by mouth at bedtime as needed.       No current facility-administered medications for this visit.    Family History  Problem Relation Age of Onset  . Hypertension Father   . Diabetes Maternal Grandfather   . CAD Paternal Grandfather 89    Died MI    ROS:  Pertinent items are noted in HPI.  Otherwise, a comprehensive ROS was negative.  Exam:   Ht  5' 4.75" (1.645 m)  Wt 268 lb (121.564 kg)  BMI 44.92 kg/m2  LMP 04/09/2009 Height: 5' 4.75" (164.5 cm)  Ht Readings from Last 3 Encounters:  11/30/12 5' 4.75" (1.645 m)  09/29/12 5\' 6"  (1.676 m)    General appearance: alert, cooperative and appears stated age Head: Normocephalic, without obvious abnormality, atraumatic Neck: no adenopathy, supple, symmetrical, trachea midline and thyroid normal to inspection and palpation Lungs: clear to auscultation bilaterally Breasts: normal appearance, no masses or tenderness, No nipple retraction or dimpling, No nipple discharge or bleeding, No axillary or supraclavicular adenopathy Heart: regular rate and rhythm Abdomen: soft, non-tender; no masses,  no organomegaly Extremities: extremities normal, atraumatic, no cyanosis or edema Skin: Skin color, texture, turgor normal. No rashes or lesions Lymph nodes: Cervical, supraclavicular, and axillary nodes normal. No abnormal inguinal nodes palpated Neurologic: Grossly normal   Pelvic: External genitalia:  no lesions              Urethra:  normal appearing urethra with no masses, tenderness or lesions              Bartholin's and Skene's: normal                 Vagina: normal appearing vagina with normal color and discharge, no lesions              Cervix: normal, non tender              Pap taken: no Bimanual Exam:  Uterus:  normal size, contour, position, consistency, mobility, non-tender and mid position, nodular anterior c/w fibroid history              Adnexa: normal adnexa and no mass, fullness, tenderness               Rectovaginal: Confirms               Anus:  normal sphincter tone, no lesions  A:  Well Woman with normal exam  Menopausal no HRT  History of uterine fibroid no change on exam  Diabetes good control with PCP management  Recent repair of left knee replacement with sed. Rate elevation under follow up  P:   Reviewed health and wellness pertinent to exam  Aware of need to  advise if vaginal bleeding  Continue PCP follow up  as indicated  Pap smear as per guidelines   Mammogram yearly pap smear not taken today  counseled on breast self exam, mammography screening, adequate intake of calcium and vitamin D, diet and exercise, Kegel's exercises  return annually or prn  An After Visit Summary was printed and given to the patient.

## 2012-12-01 NOTE — Progress Notes (Signed)
Note reviewed, agree with plan.  Shaima Sardinas, MD  

## 2013-01-12 ENCOUNTER — Other Ambulatory Visit: Payer: Self-pay | Admitting: Internal Medicine

## 2013-01-12 ENCOUNTER — Telehealth: Payer: Self-pay | Admitting: Emergency Medicine

## 2013-01-12 DIAGNOSIS — E559 Vitamin D deficiency, unspecified: Secondary | ICD-10-CM

## 2013-01-12 MED ORDER — VITAMIN D (ERGOCALCIFEROL) 1.25 MG (50000 UNIT) PO CAPS: ORAL_CAPSULE | ORAL | Status: DC

## 2013-01-12 NOTE — Telephone Encounter (Signed)
Fax refill for vit d 50,000 iu (ergo)caps(rx) [facility] at [address] tele [phone]

## 2013-01-26 ENCOUNTER — Other Ambulatory Visit: Payer: Self-pay | Admitting: Internal Medicine

## 2013-01-26 DIAGNOSIS — E559 Vitamin D deficiency, unspecified: Secondary | ICD-10-CM

## 2013-01-26 MED ORDER — VITAMIN D (ERGOCALCIFEROL) 1.25 MG (50000 UNIT) PO CAPS: ORAL_CAPSULE | ORAL | Status: DC

## 2013-03-20 DIAGNOSIS — E559 Vitamin D deficiency, unspecified: Secondary | ICD-10-CM | POA: Insufficient documentation

## 2013-03-20 DIAGNOSIS — E1129 Type 2 diabetes mellitus with other diabetic kidney complication: Secondary | ICD-10-CM | POA: Insufficient documentation

## 2013-03-21 ENCOUNTER — Encounter: Payer: Self-pay | Admitting: Internal Medicine

## 2013-03-21 ENCOUNTER — Ambulatory Visit (INDEPENDENT_AMBULATORY_CARE_PROVIDER_SITE_OTHER): Payer: 59 | Admitting: Internal Medicine

## 2013-03-21 VITALS — BP 116/70 | HR 92 | Temp 97.7°F | Resp 18 | Ht 65.5 in | Wt 271.8 lb

## 2013-03-21 DIAGNOSIS — J019 Acute sinusitis, unspecified: Secondary | ICD-10-CM

## 2013-03-21 DIAGNOSIS — R7402 Elevation of levels of lactic acid dehydrogenase (LDH): Secondary | ICD-10-CM

## 2013-03-21 DIAGNOSIS — R7401 Elevation of levels of liver transaminase levels: Secondary | ICD-10-CM

## 2013-03-21 DIAGNOSIS — Z1212 Encounter for screening for malignant neoplasm of rectum: Secondary | ICD-10-CM

## 2013-03-21 DIAGNOSIS — E559 Vitamin D deficiency, unspecified: Secondary | ICD-10-CM

## 2013-03-21 DIAGNOSIS — D649 Anemia, unspecified: Secondary | ICD-10-CM

## 2013-03-21 DIAGNOSIS — Z0000 Encounter for general adult medical examination without abnormal findings: Secondary | ICD-10-CM

## 2013-03-21 DIAGNOSIS — Z111 Encounter for screening for respiratory tuberculosis: Secondary | ICD-10-CM

## 2013-03-21 DIAGNOSIS — I10 Essential (primary) hypertension: Secondary | ICD-10-CM

## 2013-03-21 DIAGNOSIS — R740 Nonspecific elevation of levels of transaminase and lactic acid dehydrogenase [LDH]: Secondary | ICD-10-CM

## 2013-03-21 DIAGNOSIS — Z113 Encounter for screening for infections with a predominantly sexual mode of transmission: Secondary | ICD-10-CM

## 2013-03-21 LAB — CBC WITH DIFFERENTIAL/PLATELET
Basophils Absolute: 0 10*3/uL (ref 0.0–0.1)
Basophils Relative: 0 % (ref 0–1)
EOS PCT: 3 % (ref 0–5)
Eosinophils Absolute: 0.3 10*3/uL (ref 0.0–0.7)
HEMATOCRIT: 37 % (ref 36.0–46.0)
Hemoglobin: 12 g/dL (ref 12.0–15.0)
LYMPHS ABS: 2.4 10*3/uL (ref 0.7–4.0)
LYMPHS PCT: 26 % (ref 12–46)
MCH: 27 pg (ref 26.0–34.0)
MCHC: 32.4 g/dL (ref 30.0–36.0)
MCV: 83.3 fL (ref 78.0–100.0)
Monocytes Absolute: 0.7 10*3/uL (ref 0.1–1.0)
Monocytes Relative: 7 % (ref 3–12)
Neutro Abs: 5.9 10*3/uL (ref 1.7–7.7)
Neutrophils Relative %: 64 % (ref 43–77)
Platelets: 319 10*3/uL (ref 150–400)
RBC: 4.44 MIL/uL (ref 3.87–5.11)
RDW: 15.7 % — AB (ref 11.5–15.5)
WBC: 9.3 10*3/uL (ref 4.0–10.5)

## 2013-03-21 MED ORDER — PREDNISONE 20 MG PO TABS: 20.0000 mg | ORAL_TABLET | ORAL | Status: DC

## 2013-03-21 MED ORDER — PHENTERMINE HCL 37.5 MG PO TABS: ORAL_TABLET | ORAL | Status: DC

## 2013-03-21 MED ORDER — AZITHROMYCIN 250 MG PO TABS
ORAL_TABLET | ORAL | Status: AC
Start: 2013-03-21 — End: 2013-03-26

## 2013-03-21 NOTE — Progress Notes (Signed)
Patient ID: Beth [name], female   DOB: 1951/02/26, 63 y.o.   MRN: [id number]  Annual Screening Comprehensive Examination  This very nice 63 y.o. DWF presents for complete physical.  Patient has been followed for HTN, Diabetes , Hyperlipidemia, and Vitamin D Deficiency.    HTN predates since 2007. Patient's BP has been controlled at home. Today's BP: 116/70 mmHg. Patient denies any cardiac symptoms as chest pain, palpitations, shortness of breath, dizziness or ankle swelling.   Patient's hyperlipidemia is controlled with diet and medications. Patient denies myalgias or other medication SE's. Last cholesterol last visit was  173, triglycerides 135, HDL 56 and LDL 90 in Sept 8185.       Patient has T2 NIDDM predating since 2008 with last A1c 6.3 % in Sept 2014. She is on Metformin and infrequently checks CBG's. Patient denies reactive hypoglycemic symptoms, visual blurring, diabetic polys, or paresthesias.   Patient has multiple other problems including DJD, Hx/o Depression, Asthma, Fibromyalgia, allergic rhinitis and sinusitis. Finally, patient has history of Vitamin D Deficiency of 13 in 2008 with last vitamin D >120 in Sept and dose was decreased     Medication List   albuterol 108 (90 BASE) MCG/ACT inhaler  Commonly known as:  PROVENTIL HFA;VENTOLIN HFA  Inhale 2 puffs into the lungs every 6 (six) hours as needed for wheezing.     ALPRAZolam 0.25 MG dissolvable tablet  Commonly known as:  NIRAVAM  Take 0.25 mg by mouth 2 (two) times daily.     aspirin 81 MG tablet  Take 81 mg by mouth daily.     azithromycin 250 MG tablet  Commonly known as:  ZITHROMAX  Take 2 tablets (500 mg) on  Day 1,  followed by 1 tablet (250 mg) once daily on Days 2 through 5.     cyclobenzaprine 10 MG tablet  Commonly known as:  FLEXERIL  Take one-half to 1 tablet  by mouth 3 times daily for  muscle spasm(s) and takes at hs     dextromethorphan-guaiFENesin 30-600 MG per 12 hr tablet  Commonly known as:   MUCINEX DM  Take 1 tablet by mouth every 12 (twelve) hours.     fexofenadine 180 MG tablet  Commonly known as:  ALLEGRA  Take 180 mg by mouth daily.     fluticasone 50 MCG/ACT nasal spray  Commonly known as:  FLONASE  Place 2 sprays into the nose daily.     IRON 100 PLUS PO  Take 100 mg by mouth daily.     MELATONIN PO  Take by mouth at bedtime.     metFORMIN 500 MG (MOD) 24 hr tablet  Commonly known as:  GLUMETZA  Take 500 mg by mouth 2 (two) times daily.     metoCLOPramide 10 MG tablet  Commonly known as:  REGLAN  Take 10 mg by mouth daily.     montelukast 10 MG tablet  Commonly known as:  SINGULAIR  Take 10 mg by mouth at bedtime. Takes daily     olmesartan-hydrochlorothiazide 20-12.5 MG per tablet  Commonly known as:  BENICAR HCT  Take 1 tablet by mouth daily.     pantoprazole 40 MG tablet  Commonly known as:  PROTONIX  Take 40 mg by mouth daily.     phentermine 37.5 MG tablet  Commonly known as:  ADIPEX-P  Take 1/2 to 1 tablet each morning for appetite suppression and weight loss     predniSONE 20 MG tablet  Commonly known as:  DELTASONE  Take 1 tablet (20 mg total) by mouth See admin instructions. 1 tab 3 x day for 3 days, then 1 tab 2 x day for 3 days, then 1 tab 1 x day for 5 days     simvastatin 40 MG tablet  Commonly known as:  ZOCOR  Take 40 mg by mouth every evening.     venlafaxine 75 MG tablet  Commonly known as:  EFFEXOR  Take 75 mg by mouth 2 (two) times daily.     VITAMIN B-12 PO  Take by mouth daily.     Vitamin D (Ergocalciferol) 50000 UNITS Caps capsule  Commonly known as:  DRISDOL  1 tab 3x per week     zaleplon 10 MG capsule  Commonly known as:  SONATA  Take 10 mg by mouth at bedtime as needed.        Allergies  Allergen Reactions  . Sulfa Antibiotics Shortness Of Breath    Also light headed, rash  . Celebrex [Celecoxib]     Elevates Blood Pressure   . Ciprofloxacin Nausea And Vomiting  . Codeine     And hydrocodone  .  Levaquin [Levofloxacin] Nausea And Vomiting  . Motrin Ib [Ibuprofen] Hives  . Prempro [Conj Estrog-Medroxyprogest Ace] Hives  . Penicillins Rash    Past Medical History  Diagnosis Date  . IBS (irritable bowel syndrome) 12/89  . Hypertension 1/97  . Fibroids, intramural 01/24/2001    Confirmed by PUS  . Bell's palsy 01/1985  . Menopausal state 04/2009    At risk for endo hyperplasia  . Obesity, Class III, BMI 40-49.9 (morbid obesity)   . Type II or unspecified type diabetes mellitus without mention of complication, not stated as uncontrolled   . Depression 2/90  . Anxiety   . Hiatal hernia 12/89  . Vitamin D deficiency   . OSA (obstructive sleep apnea)     Past Surgical History  Procedure Laterality Date  . Colonoscopy  05/2005    Normal, recheck 10 yrs  . 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 Bladder suspension  03/2006, 07/2006, 01/2007    for stress incontinence  . Total knee revision  2014  . Tonsillectomy and adenoidectomy      Family History  Problem Relation Age of Onset  . Hypertension Father   . Diabetes Father   . CAD Paternal Grandfather 26    Died MI    History  Substance Use Topics  . Smoking status: Current Every Day Smoker -- 0.50 packs/day    Types: Cigarettes  . Smokeless tobacco: Never Used  . Alcohol Use: No    ROS Constitutional: Denies fever, chills, weight loss/gain, headaches, insomnia, fatigue, night sweats, and change in appetite. Eyes: Denies redness, blurred vision, diplopia, discharge, itchy, watery eyes.  ENT: Denies discharge, congestion, post nasal drip, epistaxis, sore throat, earache, hearing loss, dental pain, Tinnitus, Vertigo, snoring. C/o recent Frontal and Lt Maxillary sinus pain. Cardio: Denies chest pain, palpitations, irregular heartbeat, syncope, dyspnea, diaphoresis, orthopnea, PND, claudication, edema Respiratory: denies cough, dyspnea, DOE, pleurisy, hoarseness, laryngitis, wheezing.  Gastrointestinal:  Denies dysphagia, heartburn, reflux, water brash, pain, cramps, nausea, vomiting, bloating, diarrhea, constipation, hematemesis, melena, hematochezia, jaundice, hemorrhoids Genitourinary: Denies dysuria, frequency, urgency, nocturia, hesitancy, discharge, hematuria, flank pain Breast:Breast lumps, nipple discharge, bleeding.  Musculoskeletal: Denies arthralgia, myalgia, stiffness, Jt. Swelling, pain, limp, and strain/sprain. Skin: Denies puritis, rash, hives, warts, acne, eczema, changing in skin lesion Neuro: No weakness, tremor, incoordination,  spasms, paresthesia, pain Psychiatric: Denies confusion, memory loss, sensory loss Endocrine: Denies change in weight, skin, hair change, nocturia, and paresthesia, diabetic polys, visual blurring, hyper / hypo glycemic episodes.  Heme/Lymph: No excessive bleeding, bruising, enlarged lymph nodes.  BP: 116/70  Pulse: 92  Temp: 97.7 F (36.5 C)  Resp: 18    Estimated body mass index is 44.53 kg/(m^2) as calculated from the following:   Height as of this encounter: 5' 5.5" (1.664 m).   Weight as of this encounter: 271 lb 12.8 oz (123.288 kg).  Physical Exam General Appearance: Well nourished, in no apparent distress. Eyes: PERRLA, EOMs, conjunctiva no swelling or erythema, normal fundi and vessels. Sinuses: Positive frontal/ & Lt maxillary tenderness ENT/Mouth: EACs patent / TMs  nl. Nares clear without erythema, swelling, mucoid exudates. Oral hygiene is good. No erythema, swelling, or exudate. Tongue normal, non-obstructing. Tonsils not swollen or erythematous. Hearing normal.  Neck: Supple, thyroid normal. No bruits, nodes or JVD. Respiratory: Respiratory effort normal.  BS equal and clear bilateral without rales, rhonci, wheezing or stridor. Cardio: Heart sounds are normal with regular rate and rhythm and no murmurs, rubs or gallops. Peripheral pulses are normal and equal bilaterally without edema. No aortic or femoral bruits. Chest: symmetric  with normal excursions and percussion. Breasts: Symmetric, without lumps, nipple discharge, retractions, or fibrocystic changes.  Abdomen: Flat, soft, with bowl sounds. Nontender, no guarding, rebound, hernias, masses, or organomegaly.  Lymphatics: Non tender without lymphadenopathy.  Genitourinary:  Musculoskeletal: Full ROM all peripheral extremities, joint stability, 5/5 strength, and normal gait. Skin: Warm and dry without rashes, lesions, cyanosis, clubbing or  ecchymosis.  Neuro: Cranial nerves intact, reflexes equal bilaterally. Normal muscle tone, no cerebellar symptoms. Sensation intact.  Pysch: Awake and oriented X 3, normal affect, Insight and Judgment appropriate.   Assessment and Plan  1. Annual Screening Examination 2. Hypertension  3. Hyperlipidemia 4. Pre Diabetes 5. Vitamin D Deficiency 6. Morbid Obesity (BMI 44.5) 7. Sinusitis  Continue prudent diet as discussed, weight control, BP monitoring, regular exercise, and medications. Discussed med's effects and SE's. Screening labs and tests as requested with regular follow-up as recommended. Will try Phentermine for weight loss with patient advised of effects & SE's. Rx Z pak and Prednisone for sinusitis

## 2013-03-21 NOTE — Patient Instructions (Signed)
Hypertension As your heart beats, it forces blood through your arteries. This force is your blood pressure. If the pressure is too high, it is called hypertension (HTN) or high blood pressure. HTN is dangerous because you may have it and not know it. High blood pressure may mean that your heart has to work harder to pump blood. Your arteries may be narrow or stiff. The extra work puts you at risk for heart disease, stroke, and other problems.  Blood pressure consists of two numbers, a higher number over a lower, 110/72, for example. It is stated as "110 over 72." The ideal is below 120 for the top number (systolic) and under 80 for the bottom (diastolic). Write down your blood pressure today. You should pay close attention to your blood pressure if you have certain conditions such as:  Heart failure.  Prior heart attack.  Diabetes  Chronic kidney disease.  Prior stroke.  Multiple risk factors for heart disease. To see if you have HTN, your blood pressure should be measured while you are seated with your arm held at the level of the heart. It should be measured at least twice. A one-time elevated blood pressure reading (especially in the Emergency Department) does not mean that you need treatment. There may be conditions in which the blood pressure is different between your right and left arms. It is important to see your caregiver soon for a recheck. Most people have essential hypertension which means that there is not a specific cause. This type of high blood pressure may be lowered by changing lifestyle factors such as:  Stress.  Smoking.  Lack of exercise.  Excessive weight.  Drug/tobacco/alcohol use.  Eating less salt. Most people do not have symptoms from high blood pressure until it has caused damage to the body. Effective treatment can often prevent, delay or reduce that damage. TREATMENT  When a cause has been identified, treatment for high blood pressure is directed at the  cause. There are a large number of medications to treat HTN. These fall into several categories, and your caregiver will help you select the medicines that are best for you. Medications may have side effects. You should review side effects with your caregiver. If your blood pressure stays high after you have made lifestyle changes or started on medicines,   Your medication(s) may need to be changed.  Other problems may need to be addressed.  Be certain you understand your prescriptions, and know how and when to take your medicine.  Be sure to follow up with your caregiver within the time frame advised (usually within two weeks) to have your blood pressure rechecked and to review your medications.  If you are taking more than one medicine to lower your blood pressure, make sure you know how and at what times they should be taken. Taking two medicines at the same time can result in blood pressure that is too low. SEEK IMMEDIATE MEDICAL CARE IF:  You develop a severe headache, blurred or changing vision, or confusion.  You have unusual weakness or numbness, or a faint feeling.  You have severe chest or abdominal pain, vomiting, or breathing problems. MAKE SURE YOU:   Understand these instructions.  Will watch your condition.  Will get help right away if you are not doing well or get worse. Document Released: 02/23/2005 Document Revised: 05/18/2011 Document Reviewed: 10/14/2007 [employer] Patient Information 2014 [employer].  Diabetes and Exercise Exercising regularly is important. It is not just about losing weight. It   has many health benefits, such as:  Improving your overall fitness, flexibility, and endurance.  Increasing your bone density.  Helping with weight control.  Decreasing your body fat.  Increasing your muscle strength.  Reducing stress and tension.  Improving your overall health. People with diabetes who exercise gain additional benefits because  exercise:  Reduces appetite.  Improves the body's use of blood sugar (glucose).  Helps lower or control blood glucose.  Decreases blood pressure.  Helps control blood lipids (such as cholesterol and triglycerides).  Improves the body's use of the hormone insulin by:  Increasing the body's insulin sensitivity.  Reducing the body's insulin needs.  Decreases the risk for heart disease because exercising:  Lowers cholesterol and triglycerides levels.  Increases the levels of good cholesterol (such as high-density lipoproteins [HDL]) in the body.  Lowers blood glucose levels. YOUR ACTIVITY PLAN  Choose an activity that you enjoy and set realistic goals. Your health care provider or diabetes educator can help you make an activity plan that works for you. You can break activities into 2 or 3 sessions throughout the day. Doing so is as good as one long session. Exercise ideas include:  Taking the dog for a walk.  Taking the stairs instead of the elevator.  Dancing to your favorite song.  Doing your favorite exercise with a friend. RECOMMENDATIONS FOR EXERCISING WITH TYPE 1 OR TYPE 2 DIABETES   Check your blood glucose before exercising. If blood glucose levels are greater than 240 mg/dL, check for urine ketones. Do not exercise if ketones are present.  Avoid injecting insulin into areas of the body that are going to be exercised. For example, avoid injecting insulin into:  The arms when playing tennis.  The legs when jogging.  Keep a record of:  Food intake before and after you exercise.  Expected peak times of insulin action.  Blood glucose levels before and after you exercise.  The type and amount of exercise you have done.  Review your records with your health care provider. Your health care provider will help you to develop guidelines for adjusting food intake and insulin amounts before and after exercising.  If you take insulin or oral hypoglycemic agents, watch  for signs and symptoms of hypoglycemia. They include:  Dizziness.  Shaking.  Sweating.  Chills.  Confusion.  Drink plenty of water while you exercise to prevent dehydration or heat stroke. Body water is lost during exercise and must be replaced.  Talk to your health care provider before starting an exercise program to make sure it is safe for you. Remember, almost any type of activity is better than none. Document Released: 05/16/2003 Document Revised: 10/26/2012 Document Reviewed: 08/02/2012 [employer] Patient Information 2014 [employer].  Cholesterol Cholesterol is a white, waxy, fat-like protein needed by your body in small amounts. The liver makes all the cholesterol you need. It is carried from the liver by the blood through the blood vessels. Deposits (plaque) may build up on blood vessel walls. This makes the arteries narrower and stiffer. Plaque increases the risk for heart attack and stroke. You cannot feel your cholesterol level even if it is very high. The only way to know is by a blood test to check your lipid (fats) levels. Once you know your cholesterol levels, you should keep a record of the test results. Work with your caregiver to to keep your levels in the desired range. WHAT THE RESULTS MEAN:  Total cholesterol is a rough measure of all the cholesterol   in your blood.  LDL is the so-called bad cholesterol. This is the type that deposits cholesterol in the walls of the arteries. You want this level to be low.  HDL is the good cholesterol because it cleans the arteries and carries the LDL away. You want this level to be high.  Triglycerides are fat that the body can either burn for energy or store. High levels are closely linked to heart disease. DESIRED LEVELS:  Total cholesterol below 200.  LDL below 100 for people at risk, below 70 for very high risk.  HDL above 50 is good, above 60 is best.  Triglycerides below 150. HOW TO LOWER YOUR  CHOLESTEROL:  Diet.  Choose fish or white meat chicken and turkey, roasted or baked. Limit fatty cuts of red meat, fried foods, and processed meats, such as sausage and lunch meat.  Eat lots of fresh fruits and vegetables. Choose whole grains, beans, pasta, potatoes and cereals.  Use only small amounts of olive, corn or canola oils. Avoid butter, mayonnaise, shortening or palm kernel oils. Avoid foods with trans-fats.  Use skim/nonfat milk and low-fat/nonfat yogurt and cheeses. Avoid whole milk, cream, ice cream, egg yolks and cheeses. Healthy desserts include angel food cake, ginger snaps, animal crackers, hard candy, popsicles, and low-fat/nonfat frozen yogurt. Avoid pastries, cakes, pies and cookies.  Exercise.  A regular program helps decrease LDL and raises HDL.  Helps with weight control.  Do things that increase your activity level like gardening, walking, or taking the stairs.  Medication.  May be prescribed by your caregiver to help lowering cholesterol and the risk for heart disease.  You may need medicine even if your levels are normal if you have several risk factors. HOME CARE INSTRUCTIONS   Follow your diet and exercise programs as suggested by your caregiver.  Take medications as directed.  Have blood work done when your caregiver feels it is necessary. MAKE SURE YOU:   Understand these instructions.  Will watch your condition.  Will get help right away if you are not doing well or get worse. Document Released: 11/18/2000 Document Revised: 05/18/2011 Document Reviewed: 12/07/2012 [employer] Patient Information 2014 [employer].  Vitamin D Deficiency Vitamin D is an important vitamin that your body needs. Having too little of it in your body is called a deficiency. A very bad deficiency can make your bones soft and can cause a condition called rickets.  Vitamin D is important to your body for different reasons, such as:   It helps your body absorb 2  minerals called calcium and phosphorus.  It helps make your bones healthy.  It may prevent some diseases, such as diabetes and multiple sclerosis.  It helps your muscles and heart. You can get vitamin D in several ways. It is a natural part of some foods. The vitamin is also added to some dairy products and cereals. Some people take vitamin D supplements. Also, your body makes vitamin D when you are in the sun. It changes the sun's rays into a form of the vitamin that your body can use. CAUSES   Not eating enough foods that contain vitamin D.  Not getting enough sunlight.  Having certain digestive system diseases that make it hard to absorb vitamin D. These diseases include Crohn's disease, chronic pancreatitis, and cystic fibrosis.  Having a surgery in which part of the stomach or small intestine is removed.  Being obese. Fat cells pull vitamin D out of your blood. That means that obese people   may not have enough vitamin D left in their blood and in other body tissues.  Having chronic kidney or liver disease. RISK FACTORS Risk factors are things that make you more likely to develop a vitamin D deficiency. They include:  Being older.  Not being able to get outside very much.  Living in a nursing home.  Having had broken bones.  Having weak or thin bones (osteoporosis).  Having a disease or condition that changes how your body absorbs vitamin D.  Having dark skin.  Some medicines such as seizure medicines or steroids.  Being overweight or obese. SYMPTOMS Mild cases of vitamin D deficiency may not have any symptoms. If you have a very bad case, symptoms may include:  Bone pain.  Muscle pain.  Falling often.  Broken bones caused by a minor injury, due to osteoporosis. DIAGNOSIS A blood test is the best way to tell if you have a vitamin D deficiency. TREATMENT Vitamin D deficiency can be treated in different ways. Treatment for vitamin D deficiency depends on what is  causing it. Options include:  Taking vitamin D supplements.  Taking a calcium supplement. Your caregiver will suggest what dose is best for you. HOME CARE INSTRUCTIONS  Take any supplements that your caregiver prescribes. Follow the directions carefully. Take only the suggested amount.  Have your blood tested 2 months after you start taking supplements.  Eat foods that contain vitamin D. Healthy choices include:  Fortified dairy products, cereals, or juices. Fortified means vitamin D has been added to the food. Check the label on the package to be sure.  Fatty fish like salmon or trout.  Eggs.  Oysters.  Do not use a tanning bed.  Keep your weight at a healthy level. Lose weight if you need to.  Keep all follow-up appointments. Your caregiver will need to perform blood tests to make sure your vitamin D deficiency is going away. SEEK MEDICAL CARE IF:  You have any questions about your treatment.  You continue to have symptoms of vitamin D deficiency.  You have nausea or vomiting.  You are constipated.  You feel confused.  You have severe abdominal or back pain. MAKE SURE YOU:  Understand these instructions.  Will watch your condition.  Will get help right away if you are not doing well or get worse. Document Released: 05/18/2011 Document Revised: 06/20/2012 Document Reviewed: 05/18/2011 [employer] Patient Information 2014 [employer].  

## 2013-03-22 ENCOUNTER — Other Ambulatory Visit: Payer: Self-pay | Admitting: Internal Medicine

## 2013-03-22 LAB — IRON AND TIBC
%SAT: 10 % — ABNORMAL LOW (ref 20–55)
Iron: 34 ug/dL — ABNORMAL LOW (ref 42–145)
TIBC: 340 ug/dL (ref 250–470)
UIBC: 306 ug/dL (ref 125–400)

## 2013-03-22 LAB — MICROALBUMIN / CREATININE URINE RATIO
Creatinine, Urine: 120.4 mg/dL
MICROALB UR: 0.55 mg/dL (ref 0.00–1.89)
Microalb Creat Ratio: 4.6 mg/g (ref 0.0–30.0)

## 2013-03-22 LAB — RPR

## 2013-03-22 LAB — VITAMIN B12: Vitamin B-12: 1800 pg/mL — ABNORMAL HIGH (ref 211–911)

## 2013-03-22 LAB — HEPATIC FUNCTION PANEL
ALBUMIN: 3.8 g/dL (ref 3.5–5.2)
ALK PHOS: 139 U/L — AB (ref 39–117)
ALT: 20 U/L (ref 0–35)
AST: 21 U/L (ref 0–37)
Bilirubin, Direct: 0.1 mg/dL (ref 0.0–0.3)
Indirect Bilirubin: 0.1 mg/dL (ref 0.0–0.9)
TOTAL PROTEIN: 6.8 g/dL (ref 6.0–8.3)
Total Bilirubin: 0.2 mg/dL — ABNORMAL LOW (ref 0.3–1.2)

## 2013-03-22 LAB — URINALYSIS, MICROSCOPIC ONLY
BACTERIA UA: NONE SEEN
CASTS: NONE SEEN
Crystals: NONE SEEN

## 2013-03-22 LAB — HEPATITIS B CORE ANTIBODY, TOTAL: Hep B Core Total Ab: NONREACTIVE

## 2013-03-22 LAB — TSH: TSH: 1.766 u[IU]/mL (ref 0.350–4.500)

## 2013-03-22 LAB — BASIC METABOLIC PANEL WITH GFR
BUN: 18 mg/dL (ref 6–23)
CHLORIDE: 99 meq/L (ref 96–112)
CO2: 31 meq/L (ref 19–32)
Calcium: 9.6 mg/dL (ref 8.4–10.5)
Creat: 0.97 mg/dL (ref 0.50–1.10)
GFR, Est African American: 73 mL/min
GFR, Est Non African American: 64 mL/min
Glucose, Bld: 96 mg/dL (ref 70–99)
Potassium: 4.4 mEq/L (ref 3.5–5.3)
Sodium: 138 mEq/L (ref 135–145)

## 2013-03-22 LAB — LIPID PANEL
Cholesterol: 144 mg/dL (ref 0–200)
HDL: 51 mg/dL (ref >39)
LDL CALC: 59 mg/dL (ref 0–99)
Total CHOL/HDL Ratio: 2.8 Ratio
Triglycerides: 169 mg/dL — ABNORMAL HIGH (ref <150)
VLDL: 34 mg/dL (ref 0–40)

## 2013-03-22 LAB — HEPATITIS A ANTIBODY, TOTAL: Hep A Total Ab: NONREACTIVE

## 2013-03-22 LAB — INSULIN, FASTING: INSULIN FASTING, SERUM: 45 u[IU]/mL — AB (ref 3–28)

## 2013-03-22 LAB — VITAMIN D 25 HYDROXY (VIT D DEFICIENCY, FRACTURES): Vit D, 25-Hydroxy: 115 ng/mL — ABNORMAL HIGH (ref 30–89)

## 2013-03-22 LAB — MAGNESIUM: Magnesium: 1.8 mg/dL (ref 1.5–2.5)

## 2013-03-22 LAB — HEPATITIS B SURFACE ANTIBODY,QUALITATIVE: HEP B S AB: NEGATIVE

## 2013-03-22 LAB — HIV ANTIBODY (ROUTINE TESTING W REFLEX): HIV: NONREACTIVE

## 2013-03-22 LAB — HEPATITIS C ANTIBODY: HCV Ab: NEGATIVE

## 2013-03-22 LAB — HEMOGLOBIN A1C
Hgb A1c MFr Bld: 6.5 % — ABNORMAL HIGH (ref <5.7)
MEAN PLASMA GLUCOSE: 140 mg/dL — AB (ref <117)

## 2013-03-23 LAB — HEPATITIS B E ANTIBODY: Hepatitis Be Antibody: NEGATIVE

## 2013-03-24 LAB — TB SKIN TEST
Induration: 0 mm
TB SKIN TEST: NEGATIVE

## 2013-03-27 ENCOUNTER — Other Ambulatory Visit: Payer: Self-pay | Admitting: Internal Medicine

## 2013-03-27 ENCOUNTER — Other Ambulatory Visit: Payer: Self-pay

## 2013-03-27 DIAGNOSIS — N3000 Acute cystitis without hematuria: Secondary | ICD-10-CM

## 2013-03-29 ENCOUNTER — Other Ambulatory Visit: Payer: 59

## 2013-03-29 DIAGNOSIS — N3000 Acute cystitis without hematuria: Secondary | ICD-10-CM

## 2013-03-30 LAB — URINALYSIS, MICROSCOPIC ONLY
BACTERIA UA: NONE SEEN
Casts: NONE SEEN
Crystals: NONE SEEN

## 2013-03-30 LAB — URINE CULTURE
COLONY COUNT: NO GROWTH
Organism ID, Bacteria: NO GROWTH

## 2013-04-12 ENCOUNTER — Ambulatory Visit (HOSPITAL_COMMUNITY)
Admission: RE | Admit: 2013-04-12 | Discharge: 2013-04-12 | Disposition: A | Discharge status: Home / Self Care | Payer: 59 | Source: Ambulatory Visit | Attending: Internal Medicine | Admitting: Internal Medicine

## 2013-04-12 DIAGNOSIS — E119 Type 2 diabetes mellitus without complications: Secondary | ICD-10-CM | POA: Insufficient documentation

## 2013-04-12 DIAGNOSIS — R0789 Other chest pain: Secondary | ICD-10-CM | POA: Insufficient documentation

## 2013-04-12 DIAGNOSIS — Z87891 Personal history of nicotine dependence: Secondary | ICD-10-CM | POA: Insufficient documentation

## 2013-04-12 DIAGNOSIS — Q254 Congenital malformation of aorta unspecified: Secondary | ICD-10-CM | POA: Insufficient documentation

## 2013-04-12 DIAGNOSIS — I10 Essential (primary) hypertension: Secondary | ICD-10-CM | POA: Insufficient documentation

## 2013-04-12 DIAGNOSIS — R0989 Other specified symptoms and signs involving the circulatory and respiratory systems: Secondary | ICD-10-CM | POA: Insufficient documentation

## 2013-04-17 ENCOUNTER — Other Ambulatory Visit: Payer: Self-pay | Admitting: Internal Medicine

## 2013-05-22 ENCOUNTER — Other Ambulatory Visit: Payer: Self-pay | Admitting: *Deleted

## 2013-05-22 MED ORDER — SIMVASTATIN 40 MG PO TABS: 40.0000 mg | ORAL_TABLET | Freq: Every evening | ORAL | Status: DC

## 2013-06-22 ENCOUNTER — Encounter: Payer: Self-pay | Admitting: Physician Assistant

## 2013-06-22 ENCOUNTER — Ambulatory Visit (INDEPENDENT_AMBULATORY_CARE_PROVIDER_SITE_OTHER): Payer: 59 | Admitting: Physician Assistant

## 2013-06-22 VITALS — BP 122/80 | HR 84 | Temp 97.4°F | Resp 16 | Wt 277.0 lb

## 2013-06-22 DIAGNOSIS — F32A Depression, unspecified: Secondary | ICD-10-CM

## 2013-06-22 DIAGNOSIS — B379 Candidiasis, unspecified: Secondary | ICD-10-CM

## 2013-06-22 DIAGNOSIS — F17200 Nicotine dependence, unspecified, uncomplicated: Secondary | ICD-10-CM

## 2013-06-22 DIAGNOSIS — Z0000 Encounter for general adult medical examination without abnormal findings: Secondary | ICD-10-CM

## 2013-06-22 DIAGNOSIS — E559 Vitamin D deficiency, unspecified: Secondary | ICD-10-CM

## 2013-06-22 DIAGNOSIS — F17210 Nicotine dependence, cigarettes, uncomplicated: Secondary | ICD-10-CM

## 2013-06-22 DIAGNOSIS — I10 Essential (primary) hypertension: Secondary | ICD-10-CM

## 2013-06-22 DIAGNOSIS — E119 Type 2 diabetes mellitus without complications: Secondary | ICD-10-CM

## 2013-06-22 DIAGNOSIS — F329 Major depressive disorder, single episode, unspecified: Secondary | ICD-10-CM

## 2013-06-22 DIAGNOSIS — Z79899 Other long term (current) drug therapy: Secondary | ICD-10-CM

## 2013-06-22 LAB — HEPATIC FUNCTION PANEL
ALK PHOS: 107 U/L (ref 39–117)
ALT: 18 U/L (ref 0–35)
AST: 19 U/L (ref 0–37)
Albumin: 3.7 g/dL (ref 3.5–5.2)
BILIRUBIN DIRECT: 0.1 mg/dL (ref 0.0–0.3)
BILIRUBIN TOTAL: 0.3 mg/dL (ref 0.2–1.2)
Indirect Bilirubin: 0.2 mg/dL (ref 0.2–1.2)
Total Protein: 6.5 g/dL (ref 6.0–8.3)

## 2013-06-22 LAB — CBC WITH DIFFERENTIAL/PLATELET
Basophils Absolute: 0 10*3/uL (ref 0.0–0.1)
Basophils Relative: 0 % (ref 0–1)
Eosinophils Absolute: 0.3 10*3/uL (ref 0.0–0.7)
Eosinophils Relative: 3 % (ref 0–5)
HCT: 35.5 % — ABNORMAL LOW (ref 36.0–46.0)
HEMOGLOBIN: 12 g/dL (ref 12.0–15.0)
LYMPHS ABS: 2.1 10*3/uL (ref 0.7–4.0)
Lymphocytes Relative: 24 % (ref 12–46)
MCH: 28 pg (ref 26.0–34.0)
MCHC: 33.8 g/dL (ref 30.0–36.0)
MCV: 82.8 fL (ref 78.0–100.0)
Monocytes Absolute: 0.5 10*3/uL (ref 0.1–1.0)
Monocytes Relative: 6 % (ref 3–12)
NEUTROS ABS: 5.9 10*3/uL (ref 1.7–7.7)
NEUTROS PCT: 67 % (ref 43–77)
PLATELETS: 197 10*3/uL (ref 150–400)
RBC: 4.29 MIL/uL (ref 3.87–5.11)
RDW: 16 % — ABNORMAL HIGH (ref 11.5–15.5)
WBC: 8.8 10*3/uL (ref 4.0–10.5)

## 2013-06-22 LAB — BASIC METABOLIC PANEL WITH GFR
BUN: 14 mg/dL (ref 6–23)
CHLORIDE: 102 meq/L (ref 96–112)
CO2: 31 mEq/L (ref 19–32)
Calcium: 9.5 mg/dL (ref 8.4–10.5)
Creat: 0.74 mg/dL (ref 0.50–1.10)
GFR, Est African American: >89 mL/min
GFR, Est Non African American: 88 mL/min
Glucose, Bld: 79 mg/dL (ref 70–99)
POTASSIUM: 4.5 meq/L (ref 3.5–5.3)
SODIUM: 140 meq/L (ref 135–145)

## 2013-06-22 LAB — LIPID PANEL
CHOL/HDL RATIO: 3 ratio
Cholesterol: 155 mg/dL (ref 0–200)
HDL: 52 mg/dL (ref >39)
LDL Cholesterol: 68 mg/dL (ref 0–99)
Triglycerides: 175 mg/dL — ABNORMAL HIGH (ref <150)
VLDL: 35 mg/dL (ref 0–40)

## 2013-06-22 LAB — TSH: TSH: 2.301 u[IU]/mL (ref 0.350–4.500)

## 2013-06-22 LAB — MAGNESIUM: MAGNESIUM: 1.9 mg/dL (ref 1.5–2.5)

## 2013-06-22 NOTE — Patient Instructions (Addendum)
Phentermine  While taking the medication we will ask that you come into the office once a month to monitor your weight, blood pressure, and heart rate. In addition we can help answer your questions about diet, exercise, and help you every step of the way with your weight loss journey. Sometime it is helpful if you bring in a food diary or use an app on your phone such as myfitnesspal to record your calorie intake, especially in the beginning.   You can start out on 1/3 to 1/2 a pill in the morning or lunch time and if you are tolerating it well you can increase to one pill daily.   What is this medicine? PHENTERMINE (FEN ter meen) decreases your appetite. This medicine is intended to be used in addition to a healthy reduced calorie diet and exercise. The best results are achieved this way. This medicine is only indicated for short-term use. Eventually your weight loss may level out and the medication will no longer be needed.   How should I use this medicine? Take this medicine by mouth. Follow the directions on the prescription label. The tablets should stay in the bottle until immediately before you take your dose. Take your doses at regular intervals. Do not take your medicine more often than directed.  Overdosage: If you think you have taken too much of this medicine contact a poison control center or emergency room at once. NOTE: This medicine is only for you. Do not share this medicine with others.  What if I miss a dose? If you miss a dose, take it as soon as you can. If it is almost time for your next dose, take only that dose. Do not take double or extra doses. Do not increase or in any way change your dose without consulting your doctor.  What should I watch for while using this medicine? Notify your physician immediately if you become short of breath while doing your normal activities. Do not take this medicine within 6 hours of bedtime. It can keep you from getting to sleep. Avoid  drinks that contain caffeine and try to stick to a regular bedtime every night. Do not stand or sit up quickly, especially if you are an older patient. This reduces the risk of dizzy or fainting spells. Avoid alcoholic drinks.  What side effects may I notice from receiving this medicine? Side effects that you should report to your doctor or health care professional as soon as possible: -chest pain, palpitations -depression or severe changes in mood -increased blood pressure -irritability -nervousness or restlessness -severe dizziness -shortness of breath -problems urinating -unusual swelling of the legs -vomiting  Side effects that usually do not require medical attention (report to your doctor or health care professional if they continue or are bothersome): -blurred vision or other eye problems -changes in sexual ability or desire -constipation or diarrhea -difficulty sleeping -dry mouth or unpleasant taste -headache -nausea This list may not describe all possible side effects. Call your doctor for medical advice about side effects. You may report side effects to FDA at 1-800-FDA-1088.    Bad carbs also include fruit juice, alcohol, and sweet tea. These are empty calories that do not signal to your brain that you are full.   Please remember the good carbs are still carbs which convert into sugar. So please measure them out no more than 1/2-1 cup of rice, oatmeal, pasta, and beans.  Veggies are however free foods! Pile them on.   I like  lean protein at every meal such as chicken, [place], pork chops, cottage cheese, etc. Just do not fry these meats and please center your meal around vegetable, the meats should be a side dish.   No all fruit is created equal. Please see the list below, the fruit at the bottom is higher in sugars than the fruit at the top   We are starting you on Metformin to prevent or treat diabetes. Metformin does not cause low blood sugars. In order to create  energy your cells need insulin and sugar but sometime your cells do not accept the insulin and this can cause increased sugars and decreased energy. The Metformin helps your cells accept insulin and the sugar to give you more energy.   The two most common side effects are nausea and diarrhea, follow these rules to avoid it! You can take imodium per box instructions when starting metformin if needed.   Rules of metformin: 1) start out slow with only one pill daily. Our goal for you is 4 pills a day or 2000mg  total.  2) take with your largest meal. 3) Take with least amount of carbs.   Call if you have any problems.

## 2013-06-22 NOTE — Progress Notes (Signed)
HPI 63 y.o. female  presents for 3 month follow up with hypertension, hyperlipidemia, diabetes and vitamin D. Her blood pressure has been controlled at home, today their BP is BP: 122/80 mmHg She does workout, she is also doing water therapy and PT for her knee. She denies chest pain, shortness of breath, dizziness.  She is on cholesterol medication and denies myalgias. Her cholesterol is at goal. The cholesterol last visit was:   Lab Results  Component Value Date   CHOL 144 03/21/2013   HDL 51 03/21/2013   LDLCALC 59 03/21/2013   TRIG 169* 03/21/2013   CHOLHDL 2.8 03/21/2013   She has been working on diet and exercise for Diabetes, AM sugars run 120-139 uses accucheck, and denies paresthesia of the feet, polydipsia, polyuria and visual disturbances. Goes once a year to Dr. Delman Cheadle for her eye appointments. Last A1C in the office was:  Lab Results  Component Value Date   HGBA1C 6.5* 03/21/2013   Patient is on Vitamin D supplement.     Current Medications:  Current Outpatient Prescriptions on File Prior to Visit  Medication Sig Dispense Refill  . albuterol (PROVENTIL HFA;VENTOLIN HFA) 108 (90 BASE) MCG/ACT inhaler Inhale 2 puffs into the lungs every 6 (six) hours as needed for wheezing.      Marland Kitchen ALPRAZolam (NIRAVAM) 0.25 MG dissolvable tablet Take 0.25 mg by mouth 2 (two) times daily.       Marland Kitchen aspirin 81 MG tablet Take 81 mg by mouth daily.      . Cyanocobalamin (VITAMIN B-12 PO) Take by mouth daily.      . cyclobenzaprine (FLEXERIL) 10 MG tablet Take one-half to 1 tablet  by mouth 3 times daily for  muscle spasm(s) and takes at hs      . dextromethorphan-guaiFENesin (MUCINEX DM) 30-600 MG per 12 hr tablet Take 1 tablet by mouth every 12 (twelve) hours.      . fexofenadine (ALLEGRA) 180 MG tablet Take 180 mg by mouth daily.      . fluticasone (FLONASE) 50 MCG/ACT nasal spray Place 2 sprays into the nose daily.      . Iron-Vit C-Vit B12-Folic Acid (IRON 203 PLUS PO) Take 100 mg by mouth daily.       Marland Kitchen MELATONIN PO Take by mouth at bedtime.      . meloxicam (MOBIC) 15 MG tablet TAKE ONE TABLET BY MOUTH ONCE DAILY AFTER A MEAL FOR PAIN AND INFLAMMATION  90 tablet  0  . metFORMIN (GLUCOPHAGE-XR) 500 MG 24 hr tablet TAKE 1 TABLET BY MOUTH AT BREAKFAST AND LUNCH, THEN 2 TABLETS BY MOUTH AT SUPPER.  360 tablet  1  . metoCLOPramide (REGLAN) 10 MG tablet Take 10 mg by mouth daily.       . montelukast (SINGULAIR) 10 MG tablet Take 10 mg by mouth at bedtime. Takes daily      . olmesartan-hydrochlorothiazide (BENICAR HCT) 20-12.5 MG per tablet Take 1 tablet by mouth daily.      . pantoprazole (PROTONIX) 40 MG tablet Take 40 mg by mouth daily.      . phentermine (ADIPEX-P) 37.5 MG tablet Take 1/2 to 1 tablet each morning for appetite suppression and weight loss  30 tablet  3  . simvastatin (ZOCOR) 40 MG tablet Take 1 tablet (40 mg total) by mouth every evening.  90 tablet  2  . venlafaxine (EFFEXOR) 75 MG tablet Take 75 mg by mouth 2 (two) times daily.       . Vitamin D,  Ergocalciferol, (DRISDOL) 50000 UNITS CAPS capsule 1 tab 3x per week      . zaleplon (SONATA) 10 MG capsule Take 10 mg by mouth at bedtime as needed.       No current facility-administered medications on file prior to visit.   Medical History:  Past Medical History  Diagnosis Date  . IBS (irritable bowel syndrome) 12/89  . Hypertension 1/97  . Fibroids, intramural 01/24/2001    Confirmed by PUS  . Bell's palsy 01/1985  . Menopausal state 04/2009    At risk for endo hyperplasia  . Obesity, Class III, BMI 40-49.9 (morbid obesity)   . Type II or unspecified type diabetes mellitus without mention of complication, not stated as uncontrolled   . Depression 2/90  . Anxiety   . Hiatal hernia 12/89  . Vitamin D deficiency   . OSA (obstructive sleep apnea)    Allergies:  Allergies  Allergen Reactions  . Sulfa Antibiotics Shortness Of Breath    Also light headed, rash  . Celebrex [Celecoxib]     Elevates Blood Pressure   .  Ciprofloxacin Nausea And Vomiting  . Codeine     And hydrocodone  . Levaquin [Levofloxacin] Nausea And Vomiting  . Motrin Ib [Ibuprofen] Hives  . Prempro [Conj Estrog-Medroxyprogest Ace] Hives  . Penicillins Rash     Review of Systems: [X]  = complains of  [ ]  = denies  General: Fatigue [ ]  Fever [ ]  Chills [ ]  Weakness [ ]   Insomnia [ ]  Eyes: Redness [ ]  Blurred vision [ ]  Diplopia [ ]   ENT: Congestion [ ]  Sinus Pain [ ]  Post Nasal Drip [ ]  Sore Throat [ ]  Earache [ ]   Cardiac: Chest pain/pressure [ ]  SOB [ ]  Orthopnea [ ]   Palpitations [ ]   Paroxysmal nocturnal dyspnea[ ]  Claudication [ ]  Edema [ ]   Pulmonary: Cough [ ]  Wheezing[ ]   SOB [ ]   Snoring [ ]   GI: Nausea [ ]  Vomiting[ ]  Dysphagia[ ]  Heartburn[ ]  Abdominal pain [ ]  Constipation [ ] ; Diarrhea [ ] ; BRBPR [ ]  Melena[ ]  GU: Hematuria[ ]  Dysuria [ ]  Nocturia[ ]  Urgency [ ]   Hesitancy [ ]  Discharge [ ]  Neuro: Headaches[ ]  Vertigo[ ]  Paresthesias[ ]  Spasm [ ]  Speech changes [ ]  Incoordination [ ]   Ortho: Arthritis Valu.Nieves ] Joint pain Valu.Nieves ] Muscle pain [ ]  Joint swelling [ ]  Back Pain [ ]  Skin:  Rash [ ]   Pruritis [ ]  Change in skin lesion [ ]   Psych: Depression[ ]  Anxiety[ ]  Confusion [ ]  Memory loss [ ]   Heme/Lypmh: Bleeding [ ]  Bruising [ ]  Enlarged lymph nodes [ ]   Endocrine: Visual blurring [ ]  Paresthesia [ ]  Polyuria [ ]  Polydypsea [ ]    Heat/cold intolerance [ ]  Hypoglycemia [ ]   Family history- Review and unchanged Social history- Review and unchanged Physical Exam: BP 122/80  Pulse 84  Temp(Src) 97.4 F (36.3 C)  Resp 16  Wt 277 lb (125.646 kg)  LMP 04/09/2009 Wt Readings from Last 3 Encounters:  06/22/13 277 lb (125.646 kg)  03/21/13 271 lb 12.8 oz (123.288 kg)  11/30/12 268 lb (121.564 kg)   General Appearance: Well nourished, in no apparent distress. Eyes: PERRLA, EOMs, conjunctiva no swelling or erythema Sinuses: No Frontal/maxillary tenderness ENT/Mouth: Ext aud canals clear, TMs without erythema, bulging. No  erythema, swelling, or exudate on post pharynx.  Tonsils not swollen or erythematous. Hearing normal.  Neck: Supple, thyroid normal.  Respiratory: Respiratory effort normal,  BS equal bilaterally without rales, rhonchi, wheezing or stridor.  Cardio: RRR with no MRGs. Brisk peripheral pulses without edema.  Abdomen: Soft, + BS. obese  Non tender, no guarding, rebound, hernias, masses. Lymphatics: Non tender without lymphadenopathy.  Musculoskeletal: Full ROM, 5/5 strength except R leg 4/5 due to pain, antalgic gait with a cane Skin: Warm, dry without rashes, lesions, ecchymosis.  Neuro: Cranial nerves intact. No cerebellar symptoms. Sensation intact.  Psych: Awake and oriented X 3, normal affect, Insight and Judgment appropriate.   Assessment and Plan:  Hypertension: Continue medication, monitor blood pressure at home. Continue DASH diet. Cholesterol: Continue diet and exercise. Check cholesterol.  Diabetes-Continue diet and exercise. Check A1C- we discussed tanzeum and invokana but she will prefer to wait for now.  Vitamin D Def- check level and continue medications.  Obesity with co morbidities- long discussion about weight loss, diet, and exercise  Will start the patient on phentermine- hand out given  Follow up in 1 month with food diary   Continue diet and meds as discussed. Further disposition pending results of labs. Discussed med's effects and SE's.    Vicie Mutters 11:54 AM

## 2013-06-23 LAB — HEMOGLOBIN A1C
HEMOGLOBIN A1C: 6.6 % — AB (ref <5.7)
Mean Plasma Glucose: 143 mg/dL — ABNORMAL HIGH (ref <117)

## 2013-06-23 LAB — VITAMIN D 25 HYDROXY (VIT D DEFICIENCY, FRACTURES): Vit D, 25-Hydroxy: >120 ng/mL — ABNORMAL HIGH (ref 30–89)

## 2013-06-23 LAB — INSULIN, FASTING: Insulin fasting, serum: 27 u[IU]/mL (ref 3–28)

## 2013-06-23 MED ORDER — FLUCONAZOLE 150 MG PO TABS: 150.0000 mg | ORAL_TABLET | Freq: Every day | ORAL | Status: DC

## 2013-06-25 ENCOUNTER — Other Ambulatory Visit: Payer: Self-pay | Admitting: Emergency Medicine

## 2013-06-26 ENCOUNTER — Other Ambulatory Visit: Payer: Self-pay | Admitting: Emergency Medicine

## 2013-06-29 ENCOUNTER — Other Ambulatory Visit: Payer: Self-pay | Admitting: *Deleted

## 2013-06-29 ENCOUNTER — Other Ambulatory Visit: Payer: Self-pay | Admitting: Internal Medicine

## 2013-06-29 ENCOUNTER — Encounter: Payer: Self-pay | Admitting: Internal Medicine

## 2013-06-29 MED ORDER — GLUCOSE BLOOD VI STRP: ORAL_STRIP | Status: DC

## 2013-08-24 ENCOUNTER — Other Ambulatory Visit: Payer: Self-pay | Admitting: Emergency Medicine

## 2013-08-24 ENCOUNTER — Other Ambulatory Visit: Payer: Self-pay | Admitting: Internal Medicine

## 2013-09-03 ENCOUNTER — Other Ambulatory Visit: Payer: Self-pay | Admitting: Internal Medicine

## 2013-09-16 ENCOUNTER — Other Ambulatory Visit: Payer: Self-pay | Admitting: Internal Medicine

## 2013-09-23 ENCOUNTER — Other Ambulatory Visit: Payer: Self-pay | Admitting: Internal Medicine

## 2013-09-24 ENCOUNTER — Other Ambulatory Visit: Payer: Self-pay | Admitting: Emergency Medicine

## 2013-09-27 ENCOUNTER — Other Ambulatory Visit: Payer: Self-pay | Admitting: Internal Medicine

## 2013-09-27 ENCOUNTER — Ambulatory Visit (INDEPENDENT_AMBULATORY_CARE_PROVIDER_SITE_OTHER): Payer: 59 | Admitting: Internal Medicine

## 2013-09-27 ENCOUNTER — Encounter: Payer: Self-pay | Admitting: Internal Medicine

## 2013-09-27 VITALS — BP 124/80 | HR 80 | Temp 97.5°F | Resp 16 | Ht 65.0 in | Wt 271.2 lb

## 2013-09-27 DIAGNOSIS — E782 Mixed hyperlipidemia: Secondary | ICD-10-CM

## 2013-09-27 DIAGNOSIS — E785 Hyperlipidemia, unspecified: Secondary | ICD-10-CM | POA: Insufficient documentation

## 2013-09-27 DIAGNOSIS — Z79899 Other long term (current) drug therapy: Secondary | ICD-10-CM

## 2013-09-27 DIAGNOSIS — E119 Type 2 diabetes mellitus without complications: Secondary | ICD-10-CM

## 2013-09-27 DIAGNOSIS — E559 Vitamin D deficiency, unspecified: Secondary | ICD-10-CM

## 2013-09-27 DIAGNOSIS — I10 Essential (primary) hypertension: Secondary | ICD-10-CM

## 2013-09-27 LAB — CBC WITH DIFFERENTIAL/PLATELET
BASOS ABS: 0 10*3/uL (ref 0.0–0.1)
BASOS PCT: 0 % (ref 0–1)
Eosinophils Absolute: 0.2 10*3/uL (ref 0.0–0.7)
Eosinophils Relative: 3 % (ref 0–5)
HCT: 37 % (ref 36.0–46.0)
HEMOGLOBIN: 12.1 g/dL (ref 12.0–15.0)
Lymphocytes Relative: 27 % (ref 12–46)
Lymphs Abs: 1.8 10*3/uL (ref 0.7–4.0)
MCH: 27.4 pg (ref 26.0–34.0)
MCHC: 32.7 g/dL (ref 30.0–36.0)
MCV: 83.9 fL (ref 78.0–100.0)
MONOS PCT: 9 % (ref 3–12)
Monocytes Absolute: 0.6 10*3/uL (ref 0.1–1.0)
NEUTROS ABS: 4.1 10*3/uL (ref 1.7–7.7)
Neutrophils Relative %: 61 % (ref 43–77)
Platelets: 230 10*3/uL (ref 150–400)
RBC: 4.41 MIL/uL (ref 3.87–5.11)
RDW: 15.7 % — ABNORMAL HIGH (ref 11.5–15.5)
WBC: 6.7 10*3/uL (ref 4.0–10.5)

## 2013-09-27 LAB — HEMOGLOBIN A1C
Hgb A1c MFr Bld: 6.3 % — ABNORMAL HIGH (ref <5.7)
MEAN PLASMA GLUCOSE: 134 mg/dL — AB (ref <117)

## 2013-09-27 MED ORDER — HYOSCYAMINE SULFATE ER 0.375 MG PO TB12: 0.3750 mg | ORAL_TABLET | Freq: Two times a day (BID) | ORAL | Status: DC

## 2013-09-27 NOTE — Patient Instructions (Signed)

## 2013-09-27 NOTE — Progress Notes (Signed)
Patient ID: Beth [name], female   DOB: [birth year], 63 y.o.   MRN: [id number]   This very nice 63 y.o.DWF presents for 3 month follow up with Hypertension, Hyperlipidemia, IBS, Pre-Diabetes and Vitamin D Deficiency. Patient reports her IBS sx's with pc dumping -type diarrhea have exacerbated and she has apparent inadvertently stopped her hyoscyamine.   HTN predates since 2007. BP has been controlled at home. Today's BP: 124/80 mmHg. Patient denies any cardiac type chest pain, palpitations, dyspnea/orthopnea/PND, dizziness, claudication, or dependent edema.   Hyperlipidemia is controlled with diet & meds. Patient denies myalgias or other med SE's. Last Lipids were Lab Results  Component Value Date   CHOL 155 06/22/2013   HDL 52 06/22/2013   LDLCALC 68 06/22/2013   TRIG 175* 06/22/2013   CHOLHDL 3.0 06/22/2013    Also, the patient has Morbid Obesity (BMI 45) and consequent  history of T2_NIDDM since 2008 and last A1c was 6.6% in Apr 2015.  Patient denies any symptoms of reactive hypoglycemia, diabetic polys, paresthesias or visual blurring.    Further, Patient has history of Vitamin D Deficiency   and last vitamin D was high over 120 in Apr 2015  and dose was cut back.  Patient supplements vitamin D without any suspected side-effects.   Medication List   ACCU-CHEK AVIVA PLUS test strip  Generic drug:  glucose blood  USE AS DIRECTED     albuterol 108 (90 BASE) MCG/ACT inhaler  Commonly known as:  PROVENTIL HFA;VENTOLIN HFA  Inhale 2 puffs into the lungs every 6 (six) hours as needed for wheezing.     ALPRAZolam 0.25 MG dissolvable tablet  Commonly known as:  NIRAVAM  Take 0.25 mg by mouth 2 (two) times daily.     aspirin 81 MG tablet  Take 81 mg by mouth daily.     cyclobenzaprine 10 MG tablet  Commonly known as:  FLEXERIL  Take one-half to 1 tablet  by mouth 3 times daily for  muscle spasm(s) and takes at hs     dextromethorphan-guaiFENesin 30-600 MG per 12 hr tablet  Commonly known  as:  MUCINEX DM  Take 1 tablet by mouth every 12 (twelve) hours.     fexofenadine 180 MG tablet  Commonly known as:  ALLEGRA  Take 180 mg by mouth daily.     fluticasone 50 MCG/ACT nasal spray  Commonly known as:  FLONASE  Place 2 sprays into the nose daily.     IRON 100 PLUS PO  Take 100 mg by mouth daily.     MELATONIN PO  Take by mouth at bedtime.     meloxicam 15 MG tablet  Commonly known as:  MOBIC  TAKE 1 TABLET BY MOUTH ONCE DAILY AFTER A MEAL AS NEEDED FOR PAIN AND INFLAMMATION     metFORMIN 500 MG 24 hr tablet  Commonly known as:  GLUCOPHAGE-XR  TAKE 1 TABLET BY MOUTH AT BREAKFAST AND LUNCH, THEN 2 TABLETS BY MOUTH AT SUPPER.     metoCLOPramide 10 MG tablet  Commonly known as:  REGLAN  TAKE 1 TABLET BY MOUTH FOUR TIMES DAILY BEFORE A MEAL AND EVERY NIGHT AT BEDTIME FOR ACID REFLUX     montelukast 10 MG tablet  Commonly known as:  SINGULAIR  TAKE 1 TABLET BY MOUTH EVERY DAY     olmesartan-hydrochlorothiazide 20-12.5 MG per tablet  Commonly known as:  BENICAR HCT  Take 1 tablet by mouth daily.     pantoprazole 40 MG tablet  Commonly known  as:  PROTONIX  Take 1 tablet by mouth  daily for acid indigestion     phentermine 37.5 MG tablet  Commonly known as:  ADIPEX-P  TAKE 1/2-1 TABLET BY MOUTH EVERY MORNING FOR APPETITE SUPPRESSION AND WEIGHT LOSS     simvastatin 40 MG tablet  Commonly known as:  ZOCOR  Take 1 tablet (40 mg total) by mouth every evening.     venlafaxine 75 MG tablet  Commonly known as:  EFFEXOR  Take 75 mg by mouth 2 (two) times daily.     VITAMIN B-12 PO  Take by mouth daily.     Vitamin D (Ergocalciferol) 50000 UNITS Caps capsule  Commonly known as:  DRISDOL  1 tab 3x per week     zaleplon 10 MG capsule  Commonly known as:  SONATA  Take 10 mg by mouth at bedtime as needed.      Allergies  Allergen Reactions  . Penicillins Rash and Anaphylaxis  . Sulfa Antibiotics Shortness Of Breath    Also light headed, rash  . Celebrex  [Celecoxib]     Elevates Blood Pressure   . Ciprofloxacin Nausea And Vomiting  . Codeine Nausea And Vomiting    And hydrocodone  . Levaquin [Levofloxacin] Nausea And Vomiting  . Motrin Ib [Ibuprofen] Hives  . Prempro [Conj Estrog-Medroxyprogest Ace] Hives   PMHx:   Past Medical History  Diagnosis Date  . IBS (irritable bowel syndrome) 12/89  . Hypertension 1/97  . Fibroids, intramural 01/24/2001    Confirmed by PUS  . Bell's palsy 01/1985  . Menopausal state 04/2009    At risk for endo hyperplasia  . Obesity, Class III, BMI 40-49.9 (morbid obesity)   . Type II or unspecified type diabetes mellitus without mention of complication, not stated as uncontrolled   . Depression 2/90  . Anxiety   . Hiatal hernia 12/89  . Vitamin D deficiency   . OSA (obstructive sleep apnea)    FHx:    Reviewed / unchanged SHx:    Reviewed / unchanged  Systems Review:  Constitutional: Denies fever, chills, wt changes, headaches, insomnia, fatigue, night sweats, change in appetite. Eyes: Denies redness, blurred vision, diplopia, discharge, itchy, watery eyes.  ENT: Denies discharge, congestion, post nasal drip, epistaxis, sore throat, earache, hearing loss, dental pain, tinnitus, vertigo, sinus pain, snoring.  CV: Denies chest pain, palpitations, irregular heartbeat, syncope, dyspnea, diaphoresis, orthopnea, PND, claudication or edema. Respiratory: denies cough, dyspnea, DOE, pleurisy, hoarseness, laryngitis, wheezing.  Gastrointestinal: Denies dysphagia, odynophagia, heartburn, reflux, water brash, abdominal pain or cramps, nausea, vomiting, bloating, diarrhea, constipation, hematemesis, melena, hematochezia  or hemorrhoids. Genitourinary: Denies dysuria, frequency, urgency, nocturia, hesitancy, discharge, hematuria or flank pain. Musculoskeletal: Denies arthralgias, myalgias, stiffness, jt. swelling, pain, limping or strain/sprain.  Skin: Denies pruritus, rash, hives, warts, acne, eczema or change in  skin lesion(s). Neuro: No weakness, tremor, incoordination, spasms, paresthesia or pain. Psychiatric: Denies confusion, memory loss or sensory loss. Endo: Denies change in weight, skin or hair change.  Heme/Lymph: No excessive bleeding, bruising or enlarged lymph nodes.  Exam:  BP 124/80  Pulse 80  Temp(Src) 97.5 F (36.4 C) (Temporal)  Resp 16  Ht 5\' 5"  (1.651 m)  Wt 271 lb 3.2 oz (123.016 kg)  BMI 45.13 kg/m2  LMP 04/09/2009  Appears well nourished and in no distress. Eyes: PERRLA, EOMs, conjunctiva no swelling or erythema. Sinuses: No frontal/maxillary tenderness ENT/Mouth: EAC's clear, TM's nl w/o erythema, bulging. Nares clear w/o erythema, swelling, exudates. Oropharynx clear without erythema or  exudates. Oral hygiene is good. Tongue normal, non obstructing. Hearing intact.  Neck: Supple. Thyroid nl. Car 2+/2+ without bruits, nodes or JVD. Chest: Respirations nl with BS clear & equal w/o rales, rhonchi, wheezing or stridor.  Cor: Heart sounds normal w/ regular rate and rhythm without sig. murmurs, gallops, clicks, or rubs. Peripheral pulses normal and equal  without edema.  Abdomen: Soft & bowel sounds normal. Non-tender w/o guarding, rebound, hernias, masses, or organomegaly.  Lymphatics: Unremarkable.  Musculoskeletal: Full ROM all peripheral extremities, joint stability, 5/5 strength, and normal gait.  Skin: Warm, dry without exposed rashes, lesions or ecchymosis apparent.  Neuro: Cranial nerves intact, reflexes equal bilaterally. Sensory-motor testing grossly intact. Tendon reflexes grossly intact.  Pysch: Alert & oriented x 3. Insight and judgement nl & appropriate. No ideations.  Assessment and Plan:  1. Hypertension - Continue monitor blood pressure at home. Continue diet/meds same.  2. Hyperlipidemia - Continue diet/meds, exercise,& lifestyle modifications. Continue monitor periodic cholesterol/liver & renal functions   3. T2_NIDDM -    4. Vitamin D Deficiency -  Continue supplementation.  5. Morbid Obesity (BMI 45)  6. IBS - Restart Levbid  Recommended regular exercise, BP monitoring, weight control, and discussed med and SE's. Recommended labs to assess and monitor clinical status. Further disposition pending results of labs.

## 2013-09-28 LAB — BASIC METABOLIC PANEL WITH GFR
BUN: 17 mg/dL (ref 6–23)
CHLORIDE: 101 meq/L (ref 96–112)
CO2: 29 mEq/L (ref 19–32)
CREATININE: 0.74 mg/dL (ref 0.50–1.10)
Calcium: 9.3 mg/dL (ref 8.4–10.5)
GFR, Est African American: >89 mL/min
GFR, Est Non African American: 88 mL/min
Glucose, Bld: 115 mg/dL — ABNORMAL HIGH (ref 70–99)
Potassium: 4.3 mEq/L (ref 3.5–5.3)
Sodium: 140 mEq/L (ref 135–145)

## 2013-09-28 LAB — LIPID PANEL
Cholesterol: 158 mg/dL (ref 0–200)
HDL: 61 mg/dL (ref >39)
LDL Cholesterol: 67 mg/dL (ref 0–99)
TRIGLYCERIDES: 152 mg/dL — AB (ref <150)
Total CHOL/HDL Ratio: 2.6 Ratio
VLDL: 30 mg/dL (ref 0–40)

## 2013-09-28 LAB — VITAMIN D 25 HYDROXY (VIT D DEFICIENCY, FRACTURES): VIT D 25 HYDROXY: 92 ng/mL — AB (ref 30–89)

## 2013-09-28 LAB — HEPATIC FUNCTION PANEL
ALBUMIN: 3.8 g/dL (ref 3.5–5.2)
ALT: 17 U/L (ref 0–35)
AST: 22 U/L (ref 0–37)
Alkaline Phosphatase: 100 U/L (ref 39–117)
BILIRUBIN TOTAL: 0.3 mg/dL (ref 0.2–1.2)
Bilirubin, Direct: 0.1 mg/dL (ref 0.0–0.3)
Indirect Bilirubin: 0.2 mg/dL (ref 0.2–1.2)
TOTAL PROTEIN: 6.6 g/dL (ref 6.0–8.3)

## 2013-09-28 LAB — TSH: TSH: 3.021 u[IU]/mL (ref 0.350–4.500)

## 2013-09-28 LAB — INSULIN, FASTING: INSULIN FASTING, SERUM: 98 u[IU]/mL — AB (ref 3–28)

## 2013-09-28 LAB — MAGNESIUM: MAGNESIUM: 1.8 mg/dL (ref 1.5–2.5)

## 2013-09-29 ENCOUNTER — Other Ambulatory Visit: Payer: Self-pay | Admitting: Emergency Medicine

## 2013-10-25 ENCOUNTER — Other Ambulatory Visit: Payer: Self-pay | Admitting: Physician Assistant

## 2013-11-28 ENCOUNTER — Other Ambulatory Visit: Payer: Self-pay | Admitting: Emergency Medicine

## 2013-11-28 ENCOUNTER — Other Ambulatory Visit: Payer: Self-pay | Admitting: Physician Assistant

## 2013-11-28 ENCOUNTER — Other Ambulatory Visit: Payer: Self-pay | Admitting: Internal Medicine

## 2013-12-05 ENCOUNTER — Ambulatory Visit (INDEPENDENT_AMBULATORY_CARE_PROVIDER_SITE_OTHER): Payer: 59 | Admitting: Certified Nurse Midwife

## 2013-12-05 ENCOUNTER — Encounter: Payer: Self-pay | Admitting: Certified Nurse Midwife

## 2013-12-05 VITALS — BP 100/60 | HR 68 | Resp 16 | Ht 64.25 in | Wt 257.0 lb

## 2013-12-05 DIAGNOSIS — Z0000 Encounter for general adult medical examination without abnormal findings: Secondary | ICD-10-CM

## 2013-12-05 DIAGNOSIS — B372 Candidiasis of skin and nail: Secondary | ICD-10-CM

## 2013-12-05 DIAGNOSIS — Z124 Encounter for screening for malignant neoplasm of cervix: Secondary | ICD-10-CM

## 2013-12-05 DIAGNOSIS — Z01419 Encounter for gynecological examination (general) (routine) without abnormal findings: Secondary | ICD-10-CM

## 2013-12-05 DIAGNOSIS — N390 Urinary tract infection, site not specified: Secondary | ICD-10-CM

## 2013-12-05 LAB — POCT URINALYSIS DIPSTICK
Bilirubin, UA: NEGATIVE
Glucose, UA: NEGATIVE
Ketones, UA: NEGATIVE
Nitrite, UA: NEGATIVE
PROTEIN UA: NEGATIVE
Urobilinogen, UA: NEGATIVE
pH, UA: 5

## 2013-12-05 MED ORDER — NYSTATIN 100000 UNIT/GM EX CREA: 1.0000 "application " | TOPICAL_CREAM | Freq: Two times a day (BID) | CUTANEOUS | Status: DC

## 2013-12-05 NOTE — Patient Instructions (Addendum)
EXERCISE AND DIET:  We recommended that you start or continue a regular exercise program for good health. Regular exercise means any activity that makes your heart beat faster and makes you sweat.  We recommend exercising at least 30 minutes per day at least 3 days a week, preferably 4 or 5.  We also recommend a diet low in fat and sugar.  Inactivity, poor dietary choices and obesity can cause diabetes, heart attack, stroke, and kidney damage, among others.    ALCOHOL AND SMOKING:  Women should limit their alcohol intake to no more than 7 drinks/beers/glasses of wine (combined, not each!) per week. Moderation of alcohol intake to this level decreases your risk of breast cancer and liver damage. And of course, no recreational drugs are part of a healthy lifestyle.  And absolutely no smoking or even second hand smoke. Most people know smoking can cause heart and lung diseases, but did you know it also contributes to weakening of your bones? Aging of your skin?  Yellowing of your teeth and nails?  CALCIUM AND VITAMIN D:  Adequate intake of calcium and Vitamin D are recommended.  The recommendations for exact amounts of these supplements seem to change often, but generally speaking 600 mg of calcium (either carbonate or citrate) and 800 units of Vitamin D per day seems prudent. Certain women may benefit from higher intake of Vitamin D.  If you are among these women, your doctor will have told you during your visit.    PAP SMEARS:  Pap smears, to check for cervical cancer or precancers,  have traditionally been done yearly, although recent scientific advances have shown that most women can have pap smears less often.  However, every woman still should have a physical exam from her gynecologist every year. It will include a breast check, inspection of the vulva and vagina to check for abnormal growths or skin changes, a visual exam of the cervix, and then an exam to evaluate the size and shape of the uterus and  ovaries.  And after 61 years of age, a rectal exam is indicated to check for rectal cancers. We will also provide age appropriate advice regarding health maintenance, like when you should have certain vaccines, screening for sexually transmitted diseases, bone density testing, colonoscopy, mammograms, etc.   MAMMOGRAMS:  All women over 40 years old should have a yearly mammogram. Many facilities now offer a "3D" mammogram, which may cost around $50 extra out of pocket. If possible,  we recommend you accept the option to have the 3D mammogram performed.  It both reduces the number of women who will be called back for extra views which then turn out to be normal, and it is better than the routine mammogram at detecting truly abnormal areas.    COLONOSCOPY:  Colonoscopy to screen for colon cancer is recommended for all women at age 50.  We know, you hate the idea of the prep.  We agree, BUT, having colon cancer and not knowing it is worse!!  Colon cancer so often starts as a polyp that can be seen and removed at colonscopy, which can quite literally save your life!  And if your first colonoscopy is normal and you have no family history of colon cancer, most women don't have to have it again for 10 years.  Once every ten years, you can do something that may end up saving your life, right?  We will be happy to help you get it scheduled when you are ready.    Be sure to check your insurance coverage so you understand how much it will cost.  It may be covered as a preventative service at no cost, but you should check your particular policy.     Yeast Infection of the Skin Some yeast on the skin is normal, but sometimes it causes an infection. If you have a yeast infection, it shows up as white or light brown patches on brown skin. You can see it better in the summer on tan skin. It causes light-colored holes in your suntan. It can happen on any area of the body. This cannot be passed from person to person. HOME  CARE  Scrub your skin daily with a dandruff shampoo. Your rash may take a couple weeks to get well.  Do not scratch or itch the rash. GET HELP RIGHT AWAY IF:   You get another infection from scratching. The skin may get warm, red, and may ooze fluid.  The infection does not seem to be getting better. MAKE SURE YOU:  Understand these instructions.  Will watch your condition.  Will get help right away if you are not doing well or get worse. Document Released: 02/06/2008 Document Revised: 05/18/2011 Document Reviewed: 02/06/2008 [facility] Patient Information 2015 [place], [name]. This information is not intended to replace advice given to you by your health care provider. Make sure you discuss any questions you have with your health care provider.

## 2013-12-05 NOTE — Progress Notes (Signed)
63 y.o. [id number] Divorced Caucasian Fe here for annual exam. Menopausal no HRT, denies vaginal bleeding. Patient complaining of spontaneous leakage recently, so has been wearing a pad daily. Patient had bladder suspension with [facility] and will return there if continues. Denies urinary frequency or urgency or pain. Patient has noticed irritation around vulva from wearing pads. No increase in discharge or itching. Sees PCP for medication management with poly pharmacy and aex/ labs. No other health issues today. Trying to stop smoking, declines cessation information today. Getting ready for trip to [facility] with family!  Patient's last menstrual period was 04/09/2009.          Sexually active: No.  The current method of family planning is post menopausal status.    Exercising: No.  exercise Smoker:  yes  Health Maintenance: Pap: 11-10-11 neg MMG: 9/15 per [patient neg Colonoscopy:  2014 polyps, f/u 1yrs BMD:   4/10 TDaP:  2007 Labs: Poct-wbc 2+, rbc tr Self breast exam: done monthly   reports that she has been smoking Cigarettes.  She has been smoking about 0.50 packs per day. She has never used smokeless tobacco. She reports that she does not drink alcohol or use illicit drugs.  Past Medical History  Diagnosis Date  . IBS (irritable bowel syndrome) 12/89  . Hypertension 1/97  . Fibroids, intramural 01/24/2001    Confirmed by PUS  . Bell's palsy 01/1985  . Menopausal state 04/2009    At risk for endo hyperplasia  . Obesity, Class III, BMI 40-49.9 (morbid obesity)   . Type II or unspecified type diabetes mellitus without mention of complication, not stated as uncontrolled   . Depression 2/90  . Anxiety   . Hiatal hernia 12/89  . Vitamin D deficiency   . OSA (obstructive sleep apnea)     Past Surgical History  Procedure Laterality Date  . Colonoscopy  05/2005    Normal, recheck 10 yrs  . 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 Bladder suspension  03/2006,  07/2006, 01/2007    for stress incontinence  . Total knee revision  2014  . Tonsillectomy and adenoidectomy      Current Outpatient Prescriptions  Medication Sig Dispense Refill  . ACCU-CHEK AVIVA PLUS test strip USE AS DIRECTED  300 each  6  . albuterol (PROVENTIL HFA;VENTOLIN HFA) 108 (90 BASE) MCG/ACT inhaler Inhale 2 puffs into the lungs every 6 (six) hours as needed for wheezing.      Marland Kitchen ALPRAZolam (NIRAVAM) 0.25 MG dissolvable tablet Take 0.25 mg by mouth 2 (two) times daily.       Marland Kitchen aspirin 81 MG tablet Take 81 mg by mouth daily.      . Cyanocobalamin (VITAMIN B-12 PO) Take by mouth daily.      . cyclobenzaprine (FLEXERIL) 10 MG tablet Take 1/2 to 1 tablet by  mouth 3 times daily for  muscle spasms  270 tablet  2  . dextromethorphan-guaiFENesin (MUCINEX DM) 30-600 MG per 12 hr tablet Take 1 tablet by mouth every 12 (twelve) hours.      . fexofenadine (ALLEGRA) 180 MG tablet Take 180 mg by mouth daily.      . fluticasone (FLONASE) 50 MCG/ACT nasal spray Place 2 sprays into the nose daily.      . hyoscyamine (LEVBID) 0.375 MG 12 hr tablet TAKE 1 TABLET BY MOUTH TWICE DAILY  180 tablet  98  . Iron-Vit C-Vit B12-Folic Acid (IRON 841 PLUS PO) Take  100 mg by mouth daily.      Marland Kitchen MELATONIN PO Take by mouth at bedtime.      . meloxicam (MOBIC) 15 MG tablet TAKE 1 TABLET BY MOUTH ONCE DAILY AFTER A MEAL AS NEEDED FOR PAIN AND INFLAMMATION  90 tablet  0  . metFORMIN (GLUCOPHAGE-XR) 500 MG 24 hr tablet TAKE 1 TABLET BY MOUTH AT BREAKFAST AND LUNCH, THEN 2 TABLETS BY MOUTH AT SUPPER.  360 tablet  1  . metoCLOPramide (REGLAN) 10 MG tablet TAKE 1 TABLET BY MOUTH FOUR TIMES DAILY BEFORE A MEAL AND EVERY NIGHT AT BEDTIME FOR ACID REFLUX  360 tablet  PRN  . montelukast (SINGULAIR) 10 MG tablet TAKE 1 TABLET BY MOUTH EVERY DAY  90 tablet  3  . olmesartan-hydrochlorothiazide (BENICAR HCT) 20-12.5 MG per tablet Take 1 tablet by mouth daily.      . pantoprazole (PROTONIX) 40 MG tablet Take 1 tablet by mouth   daily for acid indigestion  90 tablet  1  . phentermine (ADIPEX-P) 37.5 MG tablet TAKE 1/2 TO 1 TABLET BY MOUTH EVERY MORNING  30 tablet  1  . simvastatin (ZOCOR) 40 MG tablet Take 1 tablet by mouth  every evening  90 tablet  1  . venlafaxine (EFFEXOR) 75 MG tablet Take 75 mg by mouth 2 (two) times daily.       . zaleplon (SONATA) 10 MG capsule Take 10 mg by mouth at bedtime as needed.       No current facility-administered medications for this visit.    Family History  Problem Relation Age of Onset  . Hypertension Father   . Diabetes Father   . Parkinson's disease Father   . CAD Paternal Grandfather 68    Died MI  . Dementia Mother     ROS:  Pertinent items are noted in HPI.  Otherwise, a comprehensive ROS was negative.  Exam:   BP 100/60  Pulse 68  Resp 16  Ht 5' 4.25" (1.632 m)  Wt 257 lb (116.574 kg)  BMI 43.77 kg/m2  LMP 04/09/2009 Height: 5' 4.25" (163.2 cm)  Ht Readings from Last 3 Encounters:  12/05/13 5' 4.25" (1.632 m)  09/27/13 5\' 5"  (1.651 m)  03/21/13 5' 5.5" (1.664 m)    General appearance: alert, cooperative and appears stated age Head: Normocephalic, without obvious abnormality, atraumatic Neck: no adenopathy, supple, symmetrical, trachea midline and thyroid normal to inspection and palpation Lungs: clear to auscultation bilaterally Breasts: normal appearance, no masses or tenderness, No nipple retraction or dimpling, No nipple discharge or bleeding, No axillary or supraclavicular adenopathy Heart: regular rate and rhythm Abdomen: soft, non-tender; no masses,  no organomegaly Extremities: extremities normal, atraumatic, no cyanosis or edema Skin: Skin color, texture, turgor normal. No rashes or lesions Lymph nodes: Cervical, supraclavicular, and axillary nodes normal. No abnormal inguinal nodes palpated Neurologic: Grossly normal   Pelvic: External genitalia:  no lesions, slight increase pink and cracking in groin and lower perineal area, scant  exudate, wet prep taken              Urethra:  normal appearing urethra with no masses, tenderness or lesions              Bartholin's and Skene's: normal                 Vagina: normal appearing vagina with normal color and discharge, no lesions, wet prep taken, ph 4.0  Cervix: normal, non tender              Pap taken: Yes.   Bimanual Exam:  Uterus:  normal size, contour, position, consistency, mobility, non-tender and mid positon              Adnexa: normal adnexa and no mass, fullness, tenderness               Rectovaginal: Confirms               Anus:  normal sphincter tone, no lesions  Wet prep positive for yeast vulva only  A:  Well Woman with normal exam  Menopausal no HRT  Spontaneous incontinence, previous bladder suspension  R/O UTI  Yeast vulvitis  Polypharmacy for Type 2 Diabetes, Hypertension,Cholesterol, anxiety, asthma managed by PCP  P:   Reviewed health and wellness pertinent to exam  Aware of need for evaluation if vaginal bleeding  Discussed vaginal exam reflects good support, but may have UTI which can also cause problems with.  Lab Urine culture/ micro.  Reviewed findings and also can cause urinary irritation. Rx Nystatin see order  Pap smear taken today with HPVHR  Continue follow up with PCP as indicated    counseled on breast self exam, mammography screening, adequate intake of calcium and vitamin D, diet and exercise, Kegel's exercises  return annually or prn  An After Visit Summary was printed and given to the patient.

## 2013-12-06 LAB — URINALYSIS, MICROSCOPIC ONLY
Casts: NONE SEEN
Crystals: NONE SEEN

## 2013-12-06 LAB — URINE CULTURE

## 2013-12-06 NOTE — Progress Notes (Signed)
Encounter reviewed by Dr. Wallace Gappa Silva.  

## 2013-12-08 LAB — IPS PAP TEST WITH HPV

## 2013-12-12 ENCOUNTER — Other Ambulatory Visit: Payer: Self-pay | Admitting: Internal Medicine

## 2013-12-13 ENCOUNTER — Other Ambulatory Visit: Payer: Self-pay | Admitting: Emergency Medicine

## 2013-12-13 ENCOUNTER — Other Ambulatory Visit: Payer: Self-pay | Admitting: Internal Medicine

## 2013-12-19 ENCOUNTER — Other Ambulatory Visit: Payer: Self-pay | Admitting: Physician Assistant

## 2014-01-04 ENCOUNTER — Encounter: Payer: Self-pay | Admitting: Physician Assistant

## 2014-01-04 ENCOUNTER — Ambulatory Visit (INDEPENDENT_AMBULATORY_CARE_PROVIDER_SITE_OTHER): Payer: 59 | Admitting: Physician Assistant

## 2014-01-04 VITALS — BP 128/70 | HR 72 | Temp 98.1°F | Resp 16 | Ht 64.25 in | Wt 260.0 lb

## 2014-01-04 DIAGNOSIS — F329 Major depressive disorder, single episode, unspecified: Secondary | ICD-10-CM

## 2014-01-04 DIAGNOSIS — Z79899 Other long term (current) drug therapy: Secondary | ICD-10-CM

## 2014-01-04 DIAGNOSIS — E119 Type 2 diabetes mellitus without complications: Secondary | ICD-10-CM

## 2014-01-04 DIAGNOSIS — F17210 Nicotine dependence, cigarettes, uncomplicated: Secondary | ICD-10-CM

## 2014-01-04 DIAGNOSIS — F32A Depression, unspecified: Secondary | ICD-10-CM

## 2014-01-04 DIAGNOSIS — E559 Vitamin D deficiency, unspecified: Secondary | ICD-10-CM

## 2014-01-04 DIAGNOSIS — J209 Acute bronchitis, unspecified: Secondary | ICD-10-CM

## 2014-01-04 DIAGNOSIS — E782 Mixed hyperlipidemia: Secondary | ICD-10-CM

## 2014-01-04 DIAGNOSIS — Z23 Encounter for immunization: Secondary | ICD-10-CM

## 2014-01-04 DIAGNOSIS — I10 Essential (primary) hypertension: Secondary | ICD-10-CM

## 2014-01-04 LAB — CBC WITH DIFFERENTIAL/PLATELET
BASOS PCT: 0 % (ref 0–1)
Basophils Absolute: 0 10*3/uL (ref 0.0–0.1)
Eosinophils Absolute: 0.2 10*3/uL (ref 0.0–0.7)
Eosinophils Relative: 2 % (ref 0–5)
HEMATOCRIT: 37.1 % (ref 36.0–46.0)
Hemoglobin: 12.4 g/dL (ref 12.0–15.0)
LYMPHS PCT: 25 % (ref 12–46)
Lymphs Abs: 2.1 10*3/uL (ref 0.7–4.0)
MCH: 27.4 pg (ref 26.0–34.0)
MCHC: 33.4 g/dL (ref 30.0–36.0)
MCV: 82.1 fL (ref 78.0–100.0)
MONO ABS: 0.8 10*3/uL (ref 0.1–1.0)
Monocytes Relative: 9 % (ref 3–12)
NEUTROS ABS: 5.4 10*3/uL (ref 1.7–7.7)
NEUTROS PCT: 64 % (ref 43–77)
Platelets: 252 10*3/uL (ref 150–400)
RBC: 4.52 MIL/uL (ref 3.87–5.11)
RDW: 15.4 % (ref 11.5–15.5)
WBC: 8.4 10*3/uL (ref 4.0–10.5)

## 2014-01-04 MED ORDER — AZITHROMYCIN 250 MG PO TABS: ORAL_TABLET | ORAL | Status: AC

## 2014-01-04 MED ORDER — PHENTERMINE HCL 37.5 MG PO TABS: ORAL_TABLET | ORAL | Status: DC

## 2014-01-04 MED ORDER — HYDROCODONE-ACETAMINOPHEN 5-325 MG PO TABS: ORAL_TABLET | ORAL | Status: DC

## 2014-01-04 NOTE — Patient Instructions (Signed)
Bad carbs also include fruit juice, alcohol, and sweet tea. These are empty calories that do not signal to your brain that you are full.   Please remember the good carbs are still carbs which convert into sugar. So please measure them out no more than 1/2-1 cup of rice, oatmeal, pasta, and beans.  Veggies are however free foods! Pile them on.   I like lean protein at every meal such as chicken, [place], pork chops, cottage cheese, etc. Just do not fry these meats and please center your meal around vegetable, the meats should be a side dish.   No all fruit is created equal. Please see the list below, the fruit at the bottom is higher in sugars than the fruit at the top   Recommendations For Diabetic Patients:   -  Take medications as prescribed  -  Recommend Dr Fara Olden Fuhrman's book "The End of Diabetes " - Can get at  [email or web address] and encourage also get the Audio CD book  - AVOID Animal products, ie. Meat - red/white, Poultry and Dairy/especially cheese - Exercise at least 5 times a week for 30 minutes or preferably daily.  - No Smoking - Drink less than 2 drinks a day.  - Monitor your feet for sores - Have yearly Eye Exams - Recommend annual Flu vaccine  - Recommend Pneumovax and Prevnar vaccines - Shingles Vaccine (Zostavax) if over 50 y.o.  Goals:   - BMI less than 24 - Fasting sugar less than 130 or less than 150 if tapering medicines to lose weight  - Systolic BP less than 235  - Diastolic BP less than 80 - Bad LDL Cholesterol less than 70 - Triglycerides less than 150  We are giving you chantix for smoking cessation. You can do it! And we are here to help! You may have heard some scary side effects about chantix, the three most common I hear about are nausea, crazy dreams and depression.  However, I like for my patients to try to stay on 1/2 a tablet twice a day rather than one tablet twice a day as normally prescribed. This helps decrease the chances of side effects and  helps save money by making a one month prescription last two months  Please start the prescription this way:  Start 1/2 tablet by mouth once daily after food with a full glass of water for 3 days Then do 1/2 tablet by mouth twice daily for 4 days.  At this point we have several options: 1) continue on 1/2 tablet twice a day- which I encourage you to do. You can stay on this dose the rest of the time on the medication or if you still feel the need to smoke you can do one of the two options below. 2) do one tablet in the morning and 1/2 in the evening which helps decrease dreams. 3) do one tablet twice a day.   What if I miss a dose? If you miss a dose, take it as soon as you can. If it is almost time for your next dose, take only that dose. Do not take double or extra doses.  What should I watch for while using this medicine? Visit your doctor or health care professional for regular check ups. Ask for ongoing advice and encouragement from your doctor or healthcare professional, friends, and family to help you quit. If you smoke while on this medication, quit again  Your mouth may get dry. Chewing sugarless gum or hard  candy, and drinking plenty of water may help. Contact your doctor if the problem does not go away or is severe.  You may get drowsy or dizzy. Do not drive, use machinery, or do anything that needs mental alertness until you know how this medicine affects you. Do not stand or sit up quickly, especially if you are an older patient.   The use of this medicine may increase the chance of suicidal thoughts or actions. Pay special attention to how you are responding while on this medicine. Any worsening of mood, or thoughts of suicide or dying should be reported to your health care professional right away.  ADVANTAGES OF QUITTING SMOKING  Within 20 minutes, blood pressure decreases. Your pulse is at normal level.  After 8 hours, carbon monoxide levels in the blood return to normal.  Your oxygen level increases.  After 24 hours, the chance of having a heart attack starts to decrease. Your breath, hair, and body stop smelling like smoke.  After 48 hours, damaged nerve endings begin to recover. Your sense of taste and smell improve.  After 72 hours, the body is virtually free of nicotine. Your bronchial tubes relax and breathing becomes easier.  After 2 to 12 weeks, lungs can hold more air. Exercise becomes easier and circulation improves.  After 1 year, the risk of coronary heart disease is cut in half.  After 5 years, the risk of stroke falls to the same as a nonsmoker.  After 10 years, the risk of lung cancer is cut in half and the risk of other cancers decreases significantly.  After 15 years, the risk of coronary heart disease drops, usually to the level of a nonsmoker.  You will have extra money to spend on things other than cigarettes.

## 2014-01-04 NOTE — Progress Notes (Signed)
Assessment and Plan:  Hypertension: Continue medication, monitor blood pressure at home. Continue DASH diet.  Reminder to go to the ER if any CP, SOB, nausea, dizziness, severe HA, changes vision/speech, left arm numbness and tingling and jaw pain. Cholesterol: Continue diet and exercise. Check cholesterol.  Diabetes-Continue diet and exercise. Check A1C Vitamin D Def- check level and continue medications.  Obesity with co morbidities- long discussion about weight loss, diet, and exercise Smoking cessation- discussed with patient, understands risk of MI, stroke, cancer, and death.  Bronchitis- Will hold the zpak and take if she is not getting better, increase fluids, rest, cont allergy pill  Continue diet and meds as discussed. Further disposition pending results of labs. Discussed med's effects and SE's.    HPI 63 y.o. female  presents for 3 month follow up with hypertension, hyperlipidemia, diabetes and vitamin D. Her blood pressure has been controlled at home, today their BP is BP: 128/70 mmHg She does not workout. She denies chest pain, shortness of breath, dizziness.  She is on cholesterol medication and denies myalgias. Her cholesterol is at goal. The cholesterol last visit was:   Lab Results  Component Value Date   CHOL 158 09/27/2013   HDL 61 09/27/2013   LDLCALC 67 09/27/2013   TRIG 152* 09/27/2013   CHOLHDL 2.6 09/27/2013   She has been working on diet and exercise for Diabetes, she is on bASA, ARB,, she is on phentermine, metformin and has done a good job with weight loss and bringing down her sugars, and denies polydipsia and polyuria. Last A1C in the office was:  Lab Results  Component Value Date   HGBA1C 6.3* 09/27/2013   Patient is on Vitamin D supplement. Lab Results  Component Value Date   VD25OH 74* 09/27/2013   She is smoking still but she has been cutting back. She has had post nasal drip, wheezing, coughing occ green sputum, some SOB with cough for a week.   Current  Medications:  Current Outpatient Prescriptions on File Prior to Visit  Medication Sig Dispense Refill  . ACCU-CHEK AVIVA PLUS test strip USE AS DIRECTED  300 each  6  . albuterol (PROVENTIL HFA;VENTOLIN HFA) 108 (90 BASE) MCG/ACT inhaler Inhale 2 puffs into the lungs every 6 (six) hours as needed for wheezing.      Marland Kitchen ALPRAZolam (NIRAVAM) 0.25 MG dissolvable tablet Take 0.25 mg by mouth 2 (two) times daily.       Marland Kitchen aspirin 81 MG tablet Take 81 mg by mouth daily.      Marland Kitchen BENICAR HCT 40-12.5 MG per tablet TAKE 1 TABLET BY MOUTH ONCE DAILY  90 tablet  PRN  . Cyanocobalamin (VITAMIN B-12 PO) Take by mouth daily.      . cyclobenzaprine (FLEXERIL) 10 MG tablet Take 1/2 to 1 tablet by  mouth 3 times daily for  muscle spasms  270 tablet  2  . dextromethorphan-guaiFENesin (MUCINEX DM) 30-600 MG per 12 hr tablet Take 1 tablet by mouth every 12 (twelve) hours.      . fexofenadine (ALLEGRA) 180 MG tablet Take 180 mg by mouth daily.      . fluticasone (FLONASE) 50 MCG/ACT nasal spray Place 2 sprays into the nose daily.      . hyoscyamine (LEVBID) 0.375 MG 12 hr tablet TAKE 1 TABLET BY MOUTH TWICE DAILY  180 tablet  98  . Iron-Vit C-Vit B12-Folic Acid (IRON 588 PLUS PO) Take 100 mg by mouth daily.      Marland Kitchen MELATONIN  PO Take by mouth at bedtime.      . meloxicam (MOBIC) 15 MG tablet TAKE 1 TABLET BY MOUTH ONCE DAILY AFTER A MEAL AS NEEDED FOR PAIN AND INFLAMMATION  90 tablet  0  . meloxicam (MOBIC) 15 MG tablet TAKE 1 TABLET BY MOUTH ONCE DAILY AFTER A MEAL AS NEEDED FOR PAIN AND INFLAMMATION  90 tablet  0  . meloxicam (MOBIC) 15 MG tablet TAKE 1 TABLET BY MOUTH ONCE DAILY AFTER A MEAL AS NEEDED FOR PAIN AND INFLAMMATION  90 tablet  0  . metFORMIN (GLUCOPHAGE-XR) 500 MG 24 hr tablet TAKE 1 TABLET BY MOUTH AT BREAKFAST AND LUNCH AND 2 BY MOUTH AT SUPPER  360 tablet  2  . metoCLOPramide (REGLAN) 10 MG tablet TAKE 1 TABLET BY MOUTH FOUR TIMES DAILY BEFORE A MEAL AND EVERY NIGHT AT BEDTIME FOR ACID REFLUX  360 tablet   PRN  . montelukast (SINGULAIR) 10 MG tablet TAKE 1 TABLET BY MOUTH EVERY DAY  90 tablet  3  . nystatin cream (MYCOSTATIN) Apply 1 application topically 2 (two) times daily. Apply to affected area BID for up to 7 days.  30 g  0  . pantoprazole (PROTONIX) 40 MG tablet Take 1 tablet by mouth  daily for acid indigestion  90 tablet  1  . phentermine (ADIPEX-P) 37.5 MG tablet TAKE 1/2 TO 1 TABLET BY MOUTH EVERY MORNING  30 tablet  1  . simvastatin (ZOCOR) 40 MG tablet Take 1 tablet by mouth  every evening  90 tablet  1  . venlafaxine (EFFEXOR) 75 MG tablet Take 75 mg by mouth 2 (two) times daily.       . zaleplon (SONATA) 10 MG capsule Take 10 mg by mouth at bedtime as needed.       No current facility-administered medications on file prior to visit.   Medical History:  Past Medical History  Diagnosis Date  . IBS (irritable bowel syndrome) 12/89  . Hypertension 1/97  . Fibroids, intramural 01/24/2001    Confirmed by PUS  . Bell's palsy 01/1985  . Menopausal state 04/2009    At risk for endo hyperplasia  . Obesity, Class III, BMI 40-49.9 (morbid obesity)   . Type II or unspecified type diabetes mellitus without mention of complication, not stated as uncontrolled   . Depression 2/90  . Anxiety   . Hiatal hernia 12/89  . Vitamin D deficiency   . OSA (obstructive sleep apnea)    Allergies:  Allergies  Allergen Reactions  . Penicillins Rash and Anaphylaxis  . Sulfa Antibiotics Shortness Of Breath    Also light headed, rash  . Celebrex [Celecoxib]     Elevates Blood Pressure   . Ciprofloxacin Nausea And Vomiting  . Codeine Nausea And Vomiting    And hydrocodone  . Levaquin [Levofloxacin] Nausea And Vomiting  . Motrin Ib [Ibuprofen] Hives  . Prempro [Conj Estrog-Medroxyprogest Ace] Hives     Review of Systems: [X]  = complains of  [ ]  = denies General: Fatigue [ ]  Fever [ ]  Chills [ ]  Weakness [ ]   Insomnia [ ]  Eyes: Redness [ ]  Blurred vision [ ]  Diplopia [ ]   ENT: Congestion [x ]  Sinus Pain [x]  Post Nasal Drip [ ]  Sore Throat [ ]  Earache [ ]   Cardiac: Chest pain/pressure [ ]  SOB [ ]  Orthopnea [ ]   Palpitations [ ]   Paroxysmal nocturnal dyspnea[ ]  Claudication [ ]  Edema [ ]   Pulmonary: Cough [x ] Wheezing[x ]  SOB [ ]   Snoring [ ]   GI: Nausea [ ]  Vomiting[ ]  Dysphagia[ ]  Heartburn[ ]  Abdominal pain [ ]  Constipation [ ] ; Diarrhea [ ] ; BRBPR [ ]  Melena[ ]  GU: Hematuria[ ]  Dysuria [ ]  Nocturia[ ]  Urgency [ ]   Hesitancy [ ]  Discharge [ ]  Neuro: Headaches[ ]  Vertigo[ ]  Paresthesias[ ]  Spasm [ ]  Speech changes [ ]  Incoordination [ ]   Ortho: Arthritis [ ]  Joint pain [ ]  Muscle pain [ ]  Joint swelling [ ]  Back Pain [ ]  Skin:  Rash [ ]   Pruritis [ ]  Change in skin lesion [ ]   Psych: Depression[ ]  Anxiety[ ]  Confusion [ ]  Memory loss [ ]   Heme/Lypmh: Bleeding [ ]  Bruising [ ]  Enlarged lymph nodes [ ]   Endocrine: Visual blurring [ ]  Paresthesia [ ]  Polyuria [ ]  Polydypsea [ ]    Heat/cold intolerance [ ]  Hypoglycemia [ ]   Family history- Review and unchanged Social history- Review and unchanged Physical Exam: BP 128/70  Pulse 72  Temp(Src) 98.1 F (36.7 C)  Resp 16  Ht 5' 4.25" (1.632 m)  Wt 260 lb (117.935 kg)  BMI 44.28 kg/m2  LMP 04/09/2009 Wt Readings from Last 3 Encounters:  01/04/14 260 lb (117.935 kg)  12/05/13 257 lb (116.574 kg)  09/27/13 271 lb 3.2 oz (123.016 kg)   General Appearance: Well nourished, in no apparent distress. Eyes: PERRLA, EOMs, conjunctiva no swelling or erythema Sinuses: + Frontal/maxillary tenderness ENT/Mouth: Ext aud canals clear, TMs without erythema, bulging. No erythema, swelling, or exudate on post pharynx.  Tonsils not swollen or erythematous. Hearing normal.  Neck: Supple, thyroid normal.  Respiratory: Respiratory effort normal, BS equal bilaterally with mild expiratory wheeze without rales, rhonchi,  or stridor.  Cardio: RRR with no MRGs. Brisk peripheral pulses with mild bilateral edema.  Abdomen: Soft, + BS, obese  Non tender,  no guarding, rebound, hernias, masses. Lymphatics: Non tender without lymphadenopathy.  Musculoskeletal: Full ROM, 5/5 strength, antalgic gait walking with cane Skin: Warm, dry without rashes, lesions, ecchymosis.  Neuro: Cranial nerves intact. No cerebellar symptoms. Sensation intact.  Psych: Awake and oriented X 3, normal affect, Insight and Judgment appropriate.    Vicie Mutters, PA-C 1:41 PM [facility]

## 2014-01-05 LAB — BASIC METABOLIC PANEL WITH GFR
BUN: 14 mg/dL (ref 6–23)
CHLORIDE: 101 meq/L (ref 96–112)
CO2: 30 mEq/L (ref 19–32)
Calcium: 9.3 mg/dL (ref 8.4–10.5)
Creat: 0.74 mg/dL (ref 0.50–1.10)
GFR, EST NON AFRICAN AMERICAN: 88 mL/min
Glucose, Bld: 72 mg/dL (ref 70–99)
POTASSIUM: 4.5 meq/L (ref 3.5–5.3)
SODIUM: 138 meq/L (ref 135–145)

## 2014-01-05 LAB — HEMOGLOBIN A1C
HEMOGLOBIN A1C: 6.2 % — AB (ref <5.7)
Mean Plasma Glucose: 131 mg/dL — ABNORMAL HIGH (ref <117)

## 2014-01-05 LAB — LIPID PANEL
Cholesterol: 161 mg/dL (ref 0–200)
HDL: 56 mg/dL (ref >39)
LDL CALC: 80 mg/dL (ref 0–99)
Total CHOL/HDL Ratio: 2.9 Ratio
Triglycerides: 123 mg/dL (ref <150)
VLDL: 25 mg/dL (ref 0–40)

## 2014-01-05 LAB — HEPATIC FUNCTION PANEL
ALK PHOS: 115 U/L (ref 39–117)
ALT: 12 U/L (ref 0–35)
AST: 16 U/L (ref 0–37)
Albumin: 4.1 g/dL (ref 3.5–5.2)
BILIRUBIN DIRECT: 0.1 mg/dL (ref 0.0–0.3)
BILIRUBIN INDIRECT: 0.2 mg/dL (ref 0.2–1.2)
TOTAL PROTEIN: 6.5 g/dL (ref 6.0–8.3)
Total Bilirubin: 0.3 mg/dL (ref 0.2–1.2)

## 2014-01-05 LAB — INSULIN, FASTING: INSULIN FASTING, SERUM: 5.7 u[IU]/mL (ref 2.0–19.6)

## 2014-01-05 LAB — VITAMIN D 25 HYDROXY (VIT D DEFICIENCY, FRACTURES): Vit D, 25-Hydroxy: 72 ng/mL (ref 30–89)

## 2014-01-05 LAB — TSH: TSH: 2.955 u[IU]/mL (ref 0.350–4.500)

## 2014-01-05 LAB — MAGNESIUM: Magnesium: 2 mg/dL (ref 1.5–2.5)

## 2014-01-08 ENCOUNTER — Encounter: Payer: Self-pay | Admitting: Physician Assistant

## 2014-01-16 ENCOUNTER — Other Ambulatory Visit (HOSPITAL_COMMUNITY): Payer: Self-pay | Admitting: Chiropractic Medicine

## 2014-01-16 ENCOUNTER — Ambulatory Visit (HOSPITAL_COMMUNITY)
Admission: RE | Admit: 2014-01-16 | Discharge: 2014-01-16 | Disposition: A | Discharge status: Home / Self Care | Payer: 59 | Source: Ambulatory Visit | Attending: Chiropractic Medicine | Admitting: Chiropractic Medicine

## 2014-01-16 DIAGNOSIS — I878 Other specified disorders of veins: Secondary | ICD-10-CM | POA: Insufficient documentation

## 2014-01-16 DIAGNOSIS — M25552 Pain in left hip: Secondary | ICD-10-CM | POA: Diagnosis not present

## 2014-01-16 DIAGNOSIS — M25551 Pain in right hip: Secondary | ICD-10-CM | POA: Insufficient documentation

## 2014-01-16 DIAGNOSIS — M5408 Panniculitis affecting regions of neck and back, sacral and sacrococcygeal region: Secondary | ICD-10-CM

## 2014-01-24 ENCOUNTER — Other Ambulatory Visit: Payer: Self-pay

## 2014-01-24 MED ORDER — MELOXICAM 15 MG PO TABS: ORAL_TABLET | ORAL | Status: DC

## 2014-01-28 ENCOUNTER — Encounter: Payer: Self-pay | Admitting: *Deleted

## 2014-03-22 ENCOUNTER — Encounter: Payer: Self-pay | Admitting: Internal Medicine

## 2014-03-27 ENCOUNTER — Other Ambulatory Visit: Payer: Self-pay

## 2014-03-27 MED ORDER — CYCLOBENZAPRINE HCL 10 MG PO TABS: ORAL_TABLET | ORAL | Status: DC

## 2014-03-27 NOTE — Telephone Encounter (Signed)
Renae at [facility] called to have the Sept RX for cyclobenzaprine ok to refill, the shipment was lost in mail, ok'd refill as patient has not received per tracking / shipping records at [facility]

## 2014-04-02 ENCOUNTER — Other Ambulatory Visit: Payer: Self-pay | Admitting: Physician Assistant

## 2014-04-26 ENCOUNTER — Ambulatory Visit (INDEPENDENT_AMBULATORY_CARE_PROVIDER_SITE_OTHER): Payer: 59 | Admitting: Internal Medicine

## 2014-04-26 ENCOUNTER — Encounter: Payer: Self-pay | Admitting: Internal Medicine

## 2014-04-26 VITALS — BP 112/76 | HR 88 | Temp 97.7°F | Resp 16 | Ht 65.25 in | Wt 255.6 lb

## 2014-04-26 DIAGNOSIS — Z1212 Encounter for screening for malignant neoplasm of rectum: Secondary | ICD-10-CM

## 2014-04-26 DIAGNOSIS — I10 Essential (primary) hypertension: Secondary | ICD-10-CM

## 2014-04-26 DIAGNOSIS — E119 Type 2 diabetes mellitus without complications: Secondary | ICD-10-CM

## 2014-04-26 DIAGNOSIS — E782 Mixed hyperlipidemia: Secondary | ICD-10-CM

## 2014-04-26 DIAGNOSIS — E559 Vitamin D deficiency, unspecified: Secondary | ICD-10-CM

## 2014-04-26 DIAGNOSIS — Z79899 Other long term (current) drug therapy: Secondary | ICD-10-CM

## 2014-04-26 LAB — CBC WITH DIFFERENTIAL/PLATELET
BASOS ABS: 0 10*3/uL (ref 0.0–0.1)
Basophils Relative: 0 % (ref 0–1)
EOS ABS: 0.2 10*3/uL (ref 0.0–0.7)
EOS PCT: 3 % (ref 0–5)
HCT: 38.1 % (ref 36.0–46.0)
Hemoglobin: 12.4 g/dL (ref 12.0–15.0)
Lymphocytes Relative: 23 % (ref 12–46)
Lymphs Abs: 1.9 10*3/uL (ref 0.7–4.0)
MCH: 27.3 pg (ref 26.0–34.0)
MCHC: 32.5 g/dL (ref 30.0–36.0)
MCV: 83.7 fL (ref 78.0–100.0)
MPV: 10.4 fL (ref 8.6–12.4)
Monocytes Absolute: 0.7 10*3/uL (ref 0.1–1.0)
Monocytes Relative: 8 % (ref 3–12)
Neutro Abs: 5.4 10*3/uL (ref 1.7–7.7)
Neutrophils Relative %: 66 % (ref 43–77)
PLATELETS: 255 10*3/uL (ref 150–400)
RBC: 4.55 MIL/uL (ref 3.87–5.11)
RDW: 15.1 % (ref 11.5–15.5)
WBC: 8.2 10*3/uL (ref 4.0–10.5)

## 2014-04-26 NOTE — Patient Instructions (Signed)
Recommend the book "The END of DIETING" by Dr Excell Seltzer   & the book "The END of DIABETES " by Dr Excell Seltzer  At [facility] [email or web address] - get book & Audio CD's      Being diabetic has a  300% increased risk for heart attack, stroke, cancer, and alzheimer- type vascular dementia. It is very important that you work harder with diet by avoiding all foods that are white. Avoid white rice (brown & wild rice is OK), white potatoes (sweetpotatoes in moderation is OK), White bread or wheat bread or anything made out of white flour like bagels, donuts, rolls, buns, biscuits, cakes, pastries, cookies, pizza crust, and pasta (made from white flour & egg whites) - vegetarian pasta or spinach or wheat pasta is OK. Multigrain breads like Arnold's or Pepper[employer], or multigrain sandwich thins or flatbreads.  Diet, exercise and weight loss can reverse and cure diabetes in the early stages.  Diet, exercise and weight loss is very important in the control and prevention of complications of diabetes which affects every system in your body, ie. Brain - dementia/stroke, eyes - glaucoma/blindness, heart - heart attack/heart failure, kidneys - dialysis, stomach - gastric paralysis, intestines - malabsorption, nerves - severe painful neuritis, circulation - gangrene & loss of a leg(s), and finally cancer and Alzheimers.    I recommend avoid fried & greasy foods,  sweets/candy, white rice (brown or wild rice or Quinoa is OK), white potatoes (sweet potatoes are OK) - anything made from white flour - bagels, doughnuts, rolls, buns, biscuits,white and wheat breads, pizza crust and traditional pasta made of white flour & egg white(vegetarian pasta or spinach or wheat pasta is OK).  Multi-grain bread is OK - like multi-grain flat bread or sandwich thins. Avoid alcohol in excess. Exercise is also important.    Eat all the vegetables you want - avoid meat, especially red meat and dairy - especially cheese.  Cheese is the most  concentrated form of trans-fats which is the worst thing to clog up our arteries. Veggie cheese is OK which can be found in the fresh produce section at [employer] or [employer] or [employer]  Preventative Care for Adults - Female      MAINTAIN REGULAR HEALTH EXAMS:  A routine yearly physical is a good way to check in with your primary care provider about your health and preventive screening. It is also an opportunity to share updates about your health and any concerns you have, and receive a thorough all-over exam.   Most health insurance companies pay for at least some preventative services.  Check with your health plan for specific coverages.  WHAT PREVENTATIVE SERVICES DO WOMEN NEED?  Adult women should have their weight and blood pressure checked regularly.   Women age 78 and older should have their cholesterol levels checked regularly.  Women should be screened for cervical cancer with a Pap smear and pelvic exam beginning at either age 26, or 3 years after they become sexually activity.    Breast cancer screening generally begins at age 80 with a mammogram and breast exam by your primary care provider.    Beginning at age 15 and continuing to age 66, women should be screened for colorectal cancer.  Certain people may need continued testing until age 49.  Updating vaccinations is part of preventative care.  Vaccinations help protect against diseases such as the flu.  Osteoporosis is a disease in which the bones lose minerals and strength as we age. Women  ages 19 and over should discuss this with their caregivers, as should women after menopause who have other risk factors.  Lab tests are generally done as part of preventative care to screen for anemia and blood disorders, to screen for problems with the kidneys and liver, to screen for bladder problems, to check blood sugar, and to check your cholesterol level.  Preventative services generally include counseling about diet,  exercise, avoiding tobacco, drugs, excessive alcohol consumption, and sexually transmitted infections.    GENERAL RECOMMENDATIONS FOR GOOD HEALTH:  Healthy diet:  Eat a variety of foods, including fruit, vegetables, animal or vegetable protein, such as meat, fish, chicken, and eggs, or beans, lentils, tofu, and grains, such as rice.  Drink plenty of water daily.  Decrease saturated fat in the diet, avoid lots of red meat, processed foods, sweets, fast foods, and fried foods.  Exercise:  Aerobic exercise helps maintain good heart health. At least 30-40 minutes of moderate-intensity exercise is recommended. For example, a brisk walk that increases your heart rate and breathing. This should be done on most days of the week.   Find a type of exercise or a variety of exercises that you enjoy so that it becomes a part of your daily life.  Examples are running, walking, swimming, water aerobics, and biking.  For motivation and support, explore group exercise such as aerobic class, spin class, Zumba, Yoga,or  martial arts, etc.    Set exercise goals for yourself, such as a certain weight goal, walk or run in a race such as a 5k walk/run.  Speak to your primary care provider about exercise goals.  Disease prevention:  If you smoke or chew tobacco, find out from your caregiver how to quit. It can literally save your life, no matter how long you have been a tobacco user. If you do not use tobacco, never begin.   Maintain a healthy diet and normal weight. Increased weight leads to problems with blood pressure and diabetes.   The Body Mass Index or BMI is a way of measuring how much of your body is fat. Having a BMI above 27 increases the risk of heart disease, diabetes, hypertension, stroke and other problems related to obesity. Your caregiver can help determine your BMI and based on it develop an exercise and dietary program to help you achieve or maintain this important measurement at a healthful  level.  High blood pressure causes heart and blood vessel problems.  Persistent high blood pressure should be treated with medicine if weight loss and exercise do not work.   Fat and cholesterol leaves deposits in your arteries that can block them. This causes heart disease and vessel disease elsewhere in your body.  If your cholesterol is found to be high, or if you have heart disease or certain other medical conditions, then you may need to have your cholesterol monitored frequently and be treated with medication.   Ask if you should have a cardiac stress test if your history suggests this. A stress test is a test done on a treadmill that looks for heart disease. This test can find disease prior to there being a problem.  Menopause can be associated with physical symptoms and risks. Hormone replacement therapy is available to decrease these. You should talk to your caregiver about whether starting or continuing to take hormones is right for you.   Osteoporosis is a disease in which the bones lose minerals and strength as we age. This can result in serious bone  fractures. Risk of osteoporosis can be identified using a bone density scan. Women ages 83 and over should discuss this with their caregivers, as should women after menopause who have other risk factors. Ask your caregiver whether you should be taking a calcium supplement and Vitamin D, to reduce the rate of osteoporosis.   Avoid drinking alcohol in excess (more than two drinks per day).  Avoid use of street drugs. Do not share needles with anyone. Ask for professional help if you need assistance or instructions on stopping the use of alcohol, cigarettes, and/or drugs.  Brush your teeth twice a day with fluoride toothpaste, and floss once a day. Good oral hygiene prevents tooth decay and gum disease. The problems can be painful, unattractive, and can cause other health problems. Visit your dentist for a routine oral and dental check up and  preventive care every 6-12 months.   Look at your skin regularly.  Use a mirror to look at your back. Notify your caregivers of changes in moles, especially if there are changes in shapes, colors, a size larger than a pencil eraser, an irregular border, or development of new moles.  Safety:  Use seatbelts 100% of the time, whether driving or as a passenger.  Use safety devices such as hearing protection if you work in environments with loud noise or significant background noise.  Use safety glasses when doing any work that could send debris in to the eyes.  Use a helmet if you ride a bike or motorcycle.  Use appropriate safety gear for contact sports.  Talk to your caregiver about gun safety.  Use sunscreen with a SPF (or skin protection factor) of 15 or greater.  Lighter skinned people are at a greater risk of skin cancer. Don't forget to also wear sunglasses in order to protect your eyes from too much damaging sunlight. Damaging sunlight can accelerate cataract formation.   Practice safe sex. Use condoms. Condoms are used for birth control and to help reduce the spread of sexually transmitted infections (or STIs).  Some of the STIs are gonorrhea (the clap), chlamydia, syphilis, trichomonas, herpes, HPV (human papilloma virus) and HIV (human immunodeficiency virus) which causes AIDS. The herpes, HIV and HPV are viral illnesses that have no cure. These can result in disability, cancer and death.   Keep carbon monoxide and smoke detectors in your home functioning at all times. Change the batteries every 6 months or use a model that plugs into the wall.   Vaccinations:  Stay up to date with your tetanus shots and other required immunizations. You should have a booster for tetanus every 10 years. Be sure to get your flu shot every year, since 5%-20% of the [place] population comes down with the flu. The flu vaccine changes each year, so being vaccinated once is not enough. Get your shot in the fall, before  the flu season peaks.   Other vaccines to consider:  Human Papilloma Virus or HPV causes cancer of the cervix, and other infections that can be transmitted from person to person. There is a vaccine for HPV, and females should get immunized between the ages of 40 and 84. It requires a series of 3 shots.   Pneumococcal vaccine to protect against certain types of pneumonia.  This is normally recommended for adults age 30 or older.  However, adults younger than 62 years old with certain underlying conditions such as diabetes, heart or lung disease should also receive the vaccine.  Shingles vaccine to protect against  Varicella Zoster if you are older than age 63, or younger than 62 years old with certain underlying illness.  Hepatitis A vaccine to protect against a form of infection of the liver by a virus acquired from food.  Hepatitis B vaccine to protect against a form of infection of the liver by a virus acquired from blood or body fluids, particularly if you work in health care.  If you plan to travel internationally, check with your local health department for specific vaccination recommendations.  Cancer Screening:  Breast cancer screening is essential to preventive care for women. All women age 55 and older should perform a breast self-exam every month. At age 54 and older, women should have their caregiver complete a breast exam each year. Women at ages 93 and older should have a mammogram (x-ray film) of the breasts. Your caregiver can discuss how often you need mammograms.    Cervical cancer screening includes taking a Pap smear (sample of cells examined under a microscope) from the cervix (end of the uterus). It also includes testing for HPV (Human Papilloma Virus, which can cause cervical cancer). Screening and a pelvic exam should begin at age 35, or 3 years after a woman becomes sexually active. Screening should occur every year, with a Pap smear but no HPV testing, up to age 79. After  age 15, you should have a Pap smear every 3 years with HPV testing, if no HPV was found previously.   Most routine colon cancer screening begins at the age of 20. On a yearly basis, doctors may provide special easy to use take-home tests to check for hidden blood in the stool. Sigmoidoscopy or colonoscopy can detect the earliest forms of colon cancer and is life saving. These tests use a small camera at the end of a tube to directly examine the colon. Speak to your caregiver about this at age 69, when routine screening begins (and is repeated every 5 years unless early forms of pre-cancerous polyps or small growths are found).

## 2014-04-26 NOTE — Progress Notes (Signed)
Patient ID: Beth [name], female   DOB: [birth year], 64 y.o.   MRN: [id number]  Annual Comprehensive Examination  This very nice 64 y.o. DWF presents for complete physical.  Patient has been followed for HTN, T2_NIDDM  Prediabetes, Hyperlipidemia, and Vitamin D Deficiency.    HTN predates since 2007. Patient's BP has been controlled at home and patient denies any cardiac symptoms as chest pain, palpitations, shortness of breath, dizziness or ankle swelling. Today's BP: 112/76 mmHg    Patient's hyperlipidemia is controlled with diet and medications. Patient denies myalgias or other medication SE's. Last lipids were at goal  Total Chol 161; HDL  56; LDL  80; Trig 123 on 01/04/2014.   Patient has Morbid Obesity (BMI 42.23) and consequent T2_NIDDM  predating since 2008 and patient denies reactive hypoglycemic symptoms, visual blurring, diabetic polys, or paresthesias. Last A1c was  6.2% on 01/04/2014.   Finally, patient has history of Vitamin D Deficiency and last Vitamin D was  72 on 01/04/2014.  Medication Sig  . ACCU-CHEK AVIVA PLUS test strip USE AS DIRECTED  . albuterol  HFA  inhaler Inhale 2 puffs into the lungs every 6 (six) hours as needed for wheezing.  Marland Kitchen ALPRAZolam (NIRAVAM) 0.25 MG dissolvable tablet Take 0.25 mg by mouth 2 (two) times daily.   Marland Kitchen aspirin 81 MG tablet Take 81 mg by mouth daily.  Marland Kitchen BENICAR HCT 40-12.5 MG per tablet TAKE 1 TABLET BY MOUTH ONCE DAILY  . Cyanocobalamin (VITAMIN B-12 PO) Take by mouth daily.  . cyclobenzaprine (FLEXERIL) 10 MG tablet Take 1/2 to 1 tablet by  mouth 3 times daily for  muscle spasms  . dextromethorphan-guaiFENesin (MUCINEX DM) 30-600 MG per 12 hr tablet Take 1 tablet by mouth every 12 (twelve) hours.  . fexofenadine (ALLEGRA) 180 MG tablet Take 180 mg by mouth daily.  . fluticasone (FLONASE) 50 MCG/ACT nasal spray Place 2 sprays into the nose daily.  Lebron Quam 5-325 MG per tablet 1/2-1 pill twice daily for cough  . hyoscyamine (LEVBID) 0.375 MG  12 hr tablet TAKE 1 TABLET BY MOUTH TWICE DAILY  . Iron-Vit C-Vit B12-Folic Acid (IRON 782 PLUS PO) Take 100 mg by mouth daily.  Marland Kitchen MELATONIN PO Take by mouth at bedtime.  . meloxicam (MOBIC) 15 MG tablet TAKE 1 TABLET BY MOUTH ONCE DAILY AFTER A MEAL AS NEEDED   . metFORMIN -XR 500 MG 24 hr tab TAKE 1 TABLET BY MOUTH AT BREAKFAST AND LUNCH AND 2 BY MOUTH AT SUPPER  . metoCLOPramide (REGLAN) 10 MG tablet TAKE 1 TAB 4 x day  . montelukast (SINGULAIR) 10 MG tablet TAKE 1 TABLET BY MOUTH EVERY DAY  . nystatin cream (MYCOSTATIN) Apply 1 application topically 2 (two) times daily. Apply to affected area BID for up to 7 days.  . pantoprazole (PROTONIX) 40 MG tablet Take 1 tablet by mouth  daily for acid indigestion  . phentermine (ADIPEX-P) 37.5 MG tablet TAKE 1/2 TO 1 TABLET BY MOUTH EVERY MORNING  . simvastatin (ZOCOR) 40 MG tablet Take 1 tablet by mouth  every evening  . venlafaxine (EFFEXOR) 75 MG tablet Take 75 mg by mouth 2 (two) times daily.   . zaleplon (SONATA) 10 MG capsule Take 10 mg by mouth at bedtime as needed.   Allergies  Allergen Reactions  . Penicillins Rash and Anaphylaxis  . Sulfa Antibiotics Shortness Of Breath    Also light headed, rash  . Celebrex [Celecoxib]     Elevates Blood Pressure   .  Ciprofloxacin Nausea And Vomiting  . Codeine Nausea And Vomiting    And hydrocodone  . Levaquin [Levofloxacin] Nausea And Vomiting  . Motrin Ib [Ibuprofen] Hives  . Prempro [Conj Estrog-Medroxyprogest Ace] Hives   Past Medical History  Diagnosis Date  . IBS (irritable bowel syndrome) 12/89  . Hypertension 1/97  . Fibroids, intramural 01/24/2001    Confirmed by PUS  . Bell's palsy 01/1985  . Menopausal state 04/2009    At risk for endo hyperplasia  . Obesity, Class III, BMI 40-49.9 (morbid obesity)   . Type II or unspecified type diabetes mellitus without mention of complication, not stated as uncontrolled   . Depression 2/90  . Anxiety   . Hiatal hernia 12/89  . Vitamin D  deficiency   . OSA (obstructive sleep apnea)    Health Maintenance  Topic Date Due  . FOOT EXAM  11/06/1962  . INFLUENZA VACCINE  10/08/2014  . HEMOGLOBIN A1C  10/25/2014  . OPHTHALMOLOGY EXAM  11/16/2014  . TETANUS/TDAP  03/10/2015  . URINE MICROALBUMIN  04/27/2015  . MAMMOGRAM  11/24/2015  . PAP SMEAR  12/05/2016  . COLONOSCOPY  10/13/2022  . PNEUMOCOCCAL POLYSACCHARIDE VACCINE  Completed  . ZOSTAVAX  Completed   Immunization History  Administered Date(s) Administered  . PPD Test 03/21/2013  . Pneumococcal Conjugate-13 01/04/2014  . Pneumococcal Polysaccharide-23 03/09/2004  . Td 03/09/2004  . Tdap 03/09/2005  . Zoster 12/22/2012   Past Surgical History  Procedure Laterality Date  . Colonoscopy  05/2005    Normal, recheck 10 yrs  . 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 Bladder suspension  03/2006, 07/2006, 01/2007    for stress incontinence  . Total knee revision  2014  . Tonsillectomy and adenoidectomy     Family History  Problem Relation Age of Onset  . Hypertension Father   . Diabetes Father   . Parkinson's disease Father   . CAD Paternal Grandfather 59    Died MI  . Dementia Mother    History  Substance Use Topics  . Smoking status: Current Every Day Smoker -- 0.50 packs/day    Types: Cigarettes  . Smokeless tobacco: Never Used  . Alcohol Use: No    ROS Constitutional: Denies fever, chills, weight loss/gain, headaches, insomnia, fatigue, night sweats, and change in appetite. Eyes: Denies redness, blurred vision, diplopia, discharge, itchy, watery eyes.  ENT: Denies discharge, congestion, post nasal drip, epistaxis, sore throat, earache, hearing loss, dental pain, Tinnitus, Vertigo, Sinus pain, snoring.  Cardio: Denies chest pain, palpitations, irregular heartbeat, syncope, dyspnea, diaphoresis, orthopnea, PND, claudication, edema Respiratory: denies cough, dyspnea, DOE, pleurisy, hoarseness, laryngitis, wheezing.  Gastrointestinal: Denies  dysphagia, heartburn, reflux, water brash, pain, cramps, nausea, vomiting, bloating, diarrhea, constipation, hematemesis, melena, hematochezia, jaundice, hemorrhoids Genitourinary: Denies dysuria, frequency, urgency, nocturia, hesitancy, discharge, hematuria, flank pain Breast: Breast lumps, nipple discharge, bleeding.  Musculoskeletal: Denies arthralgia, myalgia, stiffness, Jt. Swelling, pain, limp, and strain/sprain. Denies falls. Skin: Denies puritis, rash, hives, warts, acne, eczema, changing in skin lesion Neuro: No weakness, tremor, incoordination, spasms, paresthesia, pain Psychiatric: Denies confusion, memory loss, sensory loss. Denies Depression. Endocrine: Denies change in weight, skin, hair change, nocturia, and paresthesia, diabetic polys, visual blurring, hyper / hypo glycemic episodes.  Heme/Lymph: No excessive bleeding, bruising, enlarged lymph nodes.  Physical Exam  BP 112/76  Pulse 88  Temp 97.7 F   Resp 16  Ht 5' 5.25"   Wt 255 lb 9.6 oz     BMI  42.23   General Appearance: Well nourished and in no apparent distress. Eyes: PERRLA, EOMs, conjunctiva no swelling or erythema, normal fundi and vessels. Sinuses: No frontal/maxillary tenderness ENT/Mouth: EACs patent / TMs  nl. Nares clear without erythema, swelling, mucoid exudates. Oral hygiene is good. No erythema, swelling, or exudate. Tongue normal, non-obstructing. Tonsils not swollen or erythematous. Hearing normal.  Neck: Supple, thyroid normal. No bruits, nodes or JVD. Respiratory: Respiratory effort normal.  BS equal and clear bilateral without rales, rhonci, wheezing or stridor. Cardio: Heart sounds are normal with regular rate and rhythm and no murmurs, rubs or gallops. Peripheral pulses are normal and equal bilaterally without edema. No aortic or femoral bruits. Chest: symmetric with normal excursions and percussion. Breasts: Symmetric, without lumps, nipple discharge, retractions, or fibrocystic changes.   Abdomen: Flat, soft, with bowl sounds. Nontender, no guarding, rebound, hernias, masses, or organomegaly.  Lymphatics: Non tender without lymphadenopathy.  Musculoskeletal: Full ROM all peripheral extremities, joint stability, 5/5 strength, and normal gait. Skin: Warm and dry without rashes, lesions, cyanosis, clubbing or  ecchymosis.  Neuro: Cranial nerves intact, reflexes equal bilaterally. Normal muscle tone, no cerebellar symptoms. Sensation intact.  Pysch: Awake and oriented X 3, normal affect, Insight and Judgment appropriate.   Assessment and Plan  1. Hypertension  - Microalbumin / creatinine urine ratio - EKG 12-Lead - [place], RETROPERITNL ABD,  LTD - TSH  2. Hyperlipidemia  - Lipid panel  3. Obesity, morbid   4. Diabetes mellitus type II, controlled  - Hemoglobin A1c - Insulin, fasting  5. Vitamin D deficiency  - Vit D  25 hydroxy (rtn osteoporosis monitoring)  6. Screening for rectal cancer  - POC Hemoccult Bld/Stl (3-Cd Home Screen); Future  7. Encounter for long-term (current) use of medications  - Urine Microscopic - CBC with Differential/Platelet - BASIC METABOLIC PANEL WITH GFR - Hepatic function panel - Magnesium   Continue prudent diet as discussed, weight control, BP monitoring, regular exercise, and medications. Discussed med's effects and SE's. Screening labs and tests as requested with regular follow-up as recommended.

## 2014-04-27 LAB — URINALYSIS, MICROSCOPIC ONLY
CASTS: NONE SEEN
Crystals: NONE SEEN

## 2014-04-27 LAB — LIPID PANEL
Cholesterol: 173 mg/dL (ref 0–200)
HDL: 63 mg/dL (ref >39)
LDL Cholesterol: 83 mg/dL (ref 0–99)
Total CHOL/HDL Ratio: 2.7 Ratio
Triglycerides: 133 mg/dL (ref <150)
VLDL: 27 mg/dL (ref 0–40)

## 2014-04-27 LAB — BASIC METABOLIC PANEL WITH GFR
BUN: 17 mg/dL (ref 6–23)
CHLORIDE: 100 meq/L (ref 96–112)
CO2: 30 meq/L (ref 19–32)
Calcium: 9.6 mg/dL (ref 8.4–10.5)
Creat: 0.72 mg/dL (ref 0.50–1.10)
GFR, Est Non African American: >89 mL/min
GLUCOSE: 89 mg/dL (ref 70–99)
Potassium: 4.2 mEq/L (ref 3.5–5.3)
SODIUM: 138 meq/L (ref 135–145)

## 2014-04-27 LAB — MICROALBUMIN / CREATININE URINE RATIO
Creatinine, Urine: 79.7 mg/dL
Microalb Creat Ratio: 5 mg/g (ref 0.0–30.0)
Microalb, Ur: 0.4 mg/dL (ref <2.0)

## 2014-04-27 LAB — HEPATIC FUNCTION PANEL
ALK PHOS: 125 U/L — AB (ref 39–117)
ALT: 13 U/L (ref 0–35)
AST: 16 U/L (ref 0–37)
Albumin: 4 g/dL (ref 3.5–5.2)
BILIRUBIN TOTAL: 0.4 mg/dL (ref 0.2–1.2)
Bilirubin, Direct: 0.1 mg/dL (ref 0.0–0.3)
Indirect Bilirubin: 0.3 mg/dL (ref 0.2–1.2)
Total Protein: 6.7 g/dL (ref 6.0–8.3)

## 2014-04-27 LAB — INSULIN, FASTING: INSULIN FASTING, SERUM: 18.6 u[IU]/mL (ref 2.0–19.6)

## 2014-04-27 LAB — HEMOGLOBIN A1C
Hgb A1c MFr Bld: 6.3 % — ABNORMAL HIGH (ref <5.7)
Mean Plasma Glucose: 134 mg/dL — ABNORMAL HIGH (ref <117)

## 2014-04-27 LAB — VITAMIN D 25 HYDROXY (VIT D DEFICIENCY, FRACTURES): VIT D 25 HYDROXY: 48 ng/mL (ref 30–100)

## 2014-04-27 LAB — MAGNESIUM: Magnesium: 1.9 mg/dL (ref 1.5–2.5)

## 2014-04-27 LAB — TSH: TSH: 3.379 u[IU]/mL (ref 0.350–4.500)

## 2014-05-03 ENCOUNTER — Other Ambulatory Visit: Payer: Self-pay | Admitting: Physician Assistant

## 2014-05-07 ENCOUNTER — Ambulatory Visit (INDEPENDENT_AMBULATORY_CARE_PROVIDER_SITE_OTHER): Payer: 59 | Admitting: *Deleted

## 2014-05-07 DIAGNOSIS — N390 Urinary tract infection, site not specified: Secondary | ICD-10-CM

## 2014-05-07 NOTE — Progress Notes (Signed)
Patient ID: Beth [name], female   DOB: 06-12-1950, 64 y.o.   MRN: [id number] Patient presents for recheck UA and C&S per Dr. Idell Pickles orders.  UA looked suspicious for UTI but he was unable to add UC in time.

## 2014-05-08 LAB — URINALYSIS, ROUTINE W REFLEX MICROSCOPIC
BILIRUBIN URINE: NEGATIVE
Glucose, UA: NEGATIVE mg/dL
HGB URINE DIPSTICK: NEGATIVE
KETONES UR: NEGATIVE mg/dL
NITRITE: NEGATIVE
PH: 5 (ref 5.0–8.0)
Protein, ur: NEGATIVE mg/dL
Specific Gravity, Urine: 1.024 (ref 1.005–1.030)
Urobilinogen, UA: 0.2 mg/dL (ref 0.0–1.0)

## 2014-05-08 LAB — URINALYSIS, MICROSCOPIC ONLY
Casts: NONE SEEN
Crystals: NONE SEEN
SQUAMOUS EPITHELIAL / LPF: NONE SEEN

## 2014-05-10 ENCOUNTER — Other Ambulatory Visit: Payer: Self-pay | Admitting: Internal Medicine

## 2014-05-10 ENCOUNTER — Encounter: Payer: Self-pay | Admitting: Internal Medicine

## 2014-05-10 DIAGNOSIS — N3090 Cystitis, unspecified without hematuria: Secondary | ICD-10-CM

## 2014-05-10 LAB — URINE CULTURE

## 2014-05-10 MED ORDER — NITROFURANTOIN MONOHYD MACRO 100 MG PO CAPS: 100.0000 mg | ORAL_CAPSULE | Freq: Two times a day (BID) | ORAL | Status: AC

## 2014-05-14 ENCOUNTER — Ambulatory Visit: Payer: Self-pay

## 2014-05-23 ENCOUNTER — Other Ambulatory Visit: Payer: Self-pay | Admitting: *Deleted

## 2014-05-23 DIAGNOSIS — Z1212 Encounter for screening for malignant neoplasm of rectum: Secondary | ICD-10-CM

## 2014-05-23 LAB — POC HEMOCCULT BLD/STL (HOME/3-CARD/SCREEN)
Card #2 Fecal Occult Blod, POC: NEGATIVE
FECAL OCCULT BLD: NEGATIVE
Fecal Occult Blood, POC: NEGATIVE

## 2014-06-05 ENCOUNTER — Ambulatory Visit (INDEPENDENT_AMBULATORY_CARE_PROVIDER_SITE_OTHER): Payer: 59 | Admitting: Internal Medicine

## 2014-06-05 ENCOUNTER — Encounter: Payer: Self-pay | Admitting: Internal Medicine

## 2014-06-05 VITALS — BP 136/98 | HR 105 | Temp 97.9°F | Resp 18 | Ht 64.25 in | Wt 249.4 lb

## 2014-06-05 DIAGNOSIS — K529 Noninfective gastroenteritis and colitis, unspecified: Secondary | ICD-10-CM

## 2014-06-05 DIAGNOSIS — R112 Nausea with vomiting, unspecified: Secondary | ICD-10-CM

## 2014-06-05 LAB — CBC WITH DIFFERENTIAL/PLATELET
BASOS PCT: 1 % (ref 0–1)
Basophils Absolute: 0.1 10*3/uL (ref 0.0–0.1)
EOS ABS: 0.2 10*3/uL (ref 0.0–0.7)
Eosinophils Relative: 2 % (ref 0–5)
HCT: 40.8 % (ref 36.0–46.0)
Hemoglobin: 13.3 g/dL (ref 12.0–15.0)
LYMPHS ABS: 2 10*3/uL (ref 0.7–4.0)
Lymphocytes Relative: 24 % (ref 12–46)
MCH: 27 pg (ref 26.0–34.0)
MCHC: 32.6 g/dL (ref 30.0–36.0)
MCV: 82.8 fL (ref 78.0–100.0)
MPV: 9.6 fL (ref 8.6–12.4)
Monocytes Absolute: 0.7 10*3/uL (ref 0.1–1.0)
Monocytes Relative: 8 % (ref 3–12)
NEUTROS ABS: 5.5 10*3/uL (ref 1.7–7.7)
Neutrophils Relative %: 65 % (ref 43–77)
PLATELETS: 250 10*3/uL (ref 150–400)
RBC: 4.93 MIL/uL (ref 3.87–5.11)
RDW: 15.5 % (ref 11.5–15.5)
WBC: 8.4 10*3/uL (ref 4.0–10.5)

## 2014-06-05 LAB — BASIC METABOLIC PANEL WITH GFR
BUN: 16 mg/dL (ref 6–23)
CO2: 29 mEq/L (ref 19–32)
Calcium: 9.7 mg/dL (ref 8.4–10.5)
Chloride: 105 mEq/L (ref 96–112)
Creat: 0.73 mg/dL (ref 0.50–1.10)
GFR, Est African American: >89 mL/min
GFR, Est Non African American: 89 mL/min
GLUCOSE: 84 mg/dL (ref 70–99)
Potassium: 4 mEq/L (ref 3.5–5.3)
Sodium: 138 mEq/L (ref 135–145)

## 2014-06-05 LAB — LIPASE: Lipase: <10 U/L (ref 0–75)

## 2014-06-05 LAB — HEPATIC FUNCTION PANEL
ALK PHOS: 113 U/L (ref 39–117)
ALT: 21 U/L (ref 0–35)
AST: 28 U/L (ref 0–37)
Albumin: 4.1 g/dL (ref 3.5–5.2)
BILIRUBIN INDIRECT: 0.3 mg/dL (ref 0.2–1.2)
BILIRUBIN TOTAL: 0.4 mg/dL (ref 0.2–1.2)
Bilirubin, Direct: 0.1 mg/dL (ref 0.0–0.3)
TOTAL PROTEIN: 7.3 g/dL (ref 6.0–8.3)

## 2014-06-05 MED ORDER — PROMETHAZINE HCL 25 MG PO TABS: 25.0000 mg | ORAL_TABLET | Freq: Four times a day (QID) | ORAL | Status: DC | PRN

## 2014-06-05 MED ORDER — ONDANSETRON 4 MG PO TBDP: ORAL_TABLET | ORAL | Status: DC

## 2014-06-05 NOTE — Progress Notes (Signed)
Subjective:    Patient ID: Beth [name], female    DOB: December 30, 1950, 64 y.o.   MRN: [id number]  Emesis  Associated symptoms include abdominal pain (mild crampiness in the epigastrum), chills, dizziness, a fever and myalgias. Pertinent negatives include no coughing or diarrhea.  Fever  Associated symptoms include abdominal pain (mild crampiness in the epigastrum), nausea and vomiting. Pertinent negatives include no coughing, diarrhea or urinary pain.   Patient reports to the office for evaluation of nausea, vomiting, and malaise.  This started Friday afternoon.  She has only vomited 2-3 times per day.  It is mostly bile that is coming up.  She is having some chest discomfort from the vomiting.  She reports that she has been having normal bowel movements.  No sick contacts that she is aware.  No suspicious food intake.  No recent travel.  She cannot eat or drink well due to the nausea.  She gets hot and cold chills.  She has been trying water and ginger ale at home with little relief.  She has been trying small frequent meals.     Review of Systems  Constitutional: Positive for fever, chills and fatigue. Negative for diaphoresis.  Respiratory: Negative for cough, chest tightness and shortness of breath.   Gastrointestinal: Positive for nausea, vomiting and abdominal pain (mild crampiness in the epigastrum). Negative for diarrhea, constipation, blood in stool and anal bleeding.  Genitourinary: Negative for dysuria, urgency, frequency, hematuria and difficulty urinating.  Musculoskeletal: Positive for myalgias and back pain.  Neurological: Positive for dizziness and light-headedness.       Objective:   Physical Exam  Constitutional: She is oriented to person, place, and time. She appears well-developed and well-nourished. No distress.  HENT:  Head: Normocephalic and atraumatic.  Mouth/Throat: Oropharynx is clear and moist. No oropharyngeal exudate.  Eyes: Conjunctivae and EOM are normal.  Pupils are equal, round, and reactive to light. No scleral icterus.  Neck: Normal range of motion. Neck supple. No JVD present. No thyromegaly present.  Cardiovascular: Normal rate, regular rhythm, normal heart sounds and intact distal pulses.  Exam reveals no gallop and no friction rub.   No murmur heard. Pulmonary/Chest: Effort normal and breath sounds normal. No respiratory distress. She has no wheezes. She has no rales. She exhibits no tenderness.  Abdominal: Soft. Bowel sounds are normal. She exhibits no distension and no mass. There is no tenderness. There is no rebound and no guarding.  Musculoskeletal: Normal range of motion.  Lymphadenopathy:    She has no cervical adenopathy.  Neurological: She is alert and oriented to person, place, and time.  Skin: Skin is warm and dry. She is not diaphoretic.  Psychiatric: She has a normal mood and affect. Her behavior is normal. Judgment and thought content normal.  Nursing note and vitals reviewed.   Filed Vitals:   06/05/14 1557  BP: 136/98  Pulse: 105  Temp: 97.9 F (36.6 C)  Resp: 18         Assessment & Plan:  Exam unconcerning.  Likely viral gastroenteritis.  Given patient is traveling will obtain labs and urine sample.  Will give phenergan and zofran for nausea and discussed symptomatic treatment.  Patient to go to ER for bloody vomit or coffee ground emesis.    1. Noninfectious gastroenteritis, unspecified  - CBC with Differential/Platelet - BASIC METABOLIC PANEL WITH GFR - Hepatic function panel - Lipase  2. Non-intractable vomiting with nausea, vomiting of unspecified type  - Urinalysis, Routine w reflex  microscopic - Culture, Urine

## 2014-06-05 NOTE — Patient Instructions (Signed)

## 2014-06-06 ENCOUNTER — Other Ambulatory Visit: Payer: Self-pay | Admitting: Internal Medicine

## 2014-06-06 LAB — URINALYSIS, ROUTINE W REFLEX MICROSCOPIC
BILIRUBIN URINE: NEGATIVE
Glucose, UA: NEGATIVE mg/dL
NITRITE: NEGATIVE
Protein, ur: NEGATIVE mg/dL
Specific Gravity, Urine: 1.014 (ref 1.005–1.030)
Urobilinogen, UA: 0.2 mg/dL (ref 0.0–1.0)
pH: 5.5 (ref 5.0–8.0)

## 2014-06-06 LAB — URINALYSIS, MICROSCOPIC ONLY: Casts: NONE SEEN

## 2014-06-06 LAB — URINE CULTURE

## 2014-06-06 MED ORDER — NITROFURANTOIN MONOHYD MACRO 100 MG PO CAPS: 100.0000 mg | ORAL_CAPSULE | Freq: Two times a day (BID) | ORAL | Status: DC

## 2014-06-07 ENCOUNTER — Ambulatory Visit: Payer: Self-pay

## 2014-07-02 ENCOUNTER — Encounter: Payer: Self-pay | Admitting: Internal Medicine

## 2014-07-02 ENCOUNTER — Ambulatory Visit (INDEPENDENT_AMBULATORY_CARE_PROVIDER_SITE_OTHER): Payer: 59 | Admitting: Internal Medicine

## 2014-07-02 VITALS — BP 146/88 | HR 92 | Temp 98.0°F | Resp 18 | Ht 64.25 in | Wt 246.0 lb

## 2014-07-02 DIAGNOSIS — R197 Diarrhea, unspecified: Secondary | ICD-10-CM

## 2014-07-02 DIAGNOSIS — R1110 Vomiting, unspecified: Secondary | ICD-10-CM

## 2014-07-02 DIAGNOSIS — R109 Unspecified abdominal pain: Secondary | ICD-10-CM

## 2014-07-02 LAB — CBC WITH DIFFERENTIAL/PLATELET
BASOS ABS: 0.1 10*3/uL (ref 0.0–0.1)
Basophils Relative: 1 % (ref 0–1)
Eosinophils Absolute: 0.2 10*3/uL (ref 0.0–0.7)
Eosinophils Relative: 3 % (ref 0–5)
HEMATOCRIT: 38.8 % (ref 36.0–46.0)
Hemoglobin: 12.7 g/dL (ref 12.0–15.0)
LYMPHS ABS: 2 10*3/uL (ref 0.7–4.0)
Lymphocytes Relative: 31 % (ref 12–46)
MCH: 27.3 pg (ref 26.0–34.0)
MCHC: 32.7 g/dL (ref 30.0–36.0)
MCV: 83.3 fL (ref 78.0–100.0)
MPV: 9.6 fL (ref 8.6–12.4)
Monocytes Absolute: 0.4 10*3/uL (ref 0.1–1.0)
Monocytes Relative: 7 % (ref 3–12)
NEUTROS PCT: 58 % (ref 43–77)
Neutro Abs: 3.7 10*3/uL (ref 1.7–7.7)
PLATELETS: 249 10*3/uL (ref 150–400)
RBC: 4.66 MIL/uL (ref 3.87–5.11)
RDW: 15.9 % — ABNORMAL HIGH (ref 11.5–15.5)
WBC: 6.3 10*3/uL (ref 4.0–10.5)

## 2014-07-02 LAB — BASIC METABOLIC PANEL WITH GFR
BUN: 14 mg/dL (ref 6–23)
CO2: 30 meq/L (ref 19–32)
Calcium: 9.6 mg/dL (ref 8.4–10.5)
Chloride: 103 mEq/L (ref 96–112)
Creat: 0.79 mg/dL (ref 0.50–1.10)
GFR, Est African American: >89 mL/min
GFR, Est Non African American: 81 mL/min
GLUCOSE: 80 mg/dL (ref 70–99)
Potassium: 4.6 mEq/L (ref 3.5–5.3)
Sodium: 139 mEq/L (ref 135–145)

## 2014-07-02 LAB — HEPATIC FUNCTION PANEL
ALK PHOS: 131 U/L — AB (ref 39–117)
ALT: 21 U/L (ref 0–35)
AST: 24 U/L (ref 0–37)
Albumin: 3.9 g/dL (ref 3.5–5.2)
BILIRUBIN TOTAL: 0.4 mg/dL (ref 0.2–1.2)
Bilirubin, Direct: 0.1 mg/dL (ref 0.0–0.3)
Indirect Bilirubin: 0.3 mg/dL (ref 0.2–1.2)
TOTAL PROTEIN: 6.5 g/dL (ref 6.0–8.3)

## 2014-07-02 LAB — LIPASE: Lipase: <10 U/L (ref 0–75)

## 2014-07-02 MED ORDER — BENZONATATE 100 MG PO CAPS: 100.0000 mg | ORAL_CAPSULE | Freq: Four times a day (QID) | ORAL | Status: DC | PRN

## 2014-07-02 MED ORDER — PROMETHAZINE HCL 25 MG PO TABS: 25.0000 mg | ORAL_TABLET | Freq: Four times a day (QID) | ORAL | Status: DC | PRN

## 2014-07-02 NOTE — Patient Instructions (Signed)
Cholelithiasis  Cholelithiasis (also called gallstones) is a form of gallbladder disease. The gallbladder is a small organ that helps you digest fats. Symptoms of gallstones are:  · Feeling sick to your stomach (nausea).  · Throwing up (vomiting).  · Belly pain.  · Yellowing of the skin (jaundice).  · Sudden pain. You may feel the pain for minutes to hours.  · Fever.  · Pain to the touch.  HOME CARE  · Only take medicines as told by your doctor.  · Eat a low-fat diet until you see your doctor again. Eating fat can result in pain.  · Follow up with your doctor as told. Attacks usually happen time after time. Surgery is usually needed for permanent treatment.  GET HELP RIGHT AWAY IF:   · Your pain gets worse.  · Your pain is not helped by medicines.  · You have a fever and lasting symptoms for more than 2-3 days.  · You have a fever and your symptoms suddenly get worse.  · You keep feeling sick to your stomach and throwing up.  MAKE SURE YOU:   · Understand these instructions.  · Will watch your condition.  · Will get help right away if you are not doing well or get worse.  Document Released: 08/12/2007 Document Revised: 10/26/2012 Document Reviewed: 08/17/2012  [employer]® Patient Information ©2015 [employer]. This information is not intended to replace advice given to you by your health care provider. Make sure you discuss any questions you have with your health care provider.

## 2014-07-02 NOTE — Progress Notes (Signed)
Subjective:    Patient ID: Beth [name], female    DOB: Jun 17, 1950, 64 y.o.   MRN: [id number]  GI Problem The primary symptoms include nausea, vomiting and diarrhea. Primary symptoms do not include fever, fatigue, abdominal pain or dysuria.  The illness is also significant for constipation. The illness does not include chills.  Emesis  Associated symptoms include diarrhea. Pertinent negatives include no abdominal pain, chills, dizziness, fever or headaches.  Diarrhea  Associated symptoms include vomiting. Pertinent negatives include no abdominal pain, chills, fever or headaches.   Patient presents to the office for evaluation for nausea, vomiting, and diarrhea x 6 months.  Patient reports that intermittently she has had some severe nausea that can last all day and she does have some waterbrash that comes into her mouth.  Her vomit has been a yellow green bile.  She reports that she is vomiting approximately 3 times per week.  She reports that every day she is nauseous.  She reports that she has intermittent diarrhea.  She last had it April 9th.  She does report that April 16th she had some diarrhea.  She feels that she has been constipated since that time.  Her last bowel movement was this morning.  She reports that it was normal.  She reports no surgeries on her abdomen.  She does report some mild periumbilical pain. She did have one episode of diarrhea that was so bad on 06/16/14 that she did require some fluids while she was on vacation.     Review of Systems  Constitutional: Negative for fever, chills and fatigue.  Respiratory: Negative for chest tightness and shortness of breath.   Gastrointestinal: Positive for nausea, vomiting, diarrhea and constipation. Negative for abdominal pain, blood in stool, abdominal distention, anal bleeding and rectal pain.  Genitourinary: Negative for dysuria, urgency, frequency, hematuria, enuresis and difficulty urinating.  Neurological: Negative for  dizziness, light-headedness and headaches.       Objective:   Physical Exam  Constitutional: She is oriented to person, place, and time. She appears well-developed and well-nourished. No distress.  HENT:  Head: Normocephalic and atraumatic.  Mouth/Throat: Oropharynx is clear and moist. No oropharyngeal exudate.  Eyes: Conjunctivae and EOM are normal. Pupils are equal, round, and reactive to light.  Neck: Normal range of motion. Neck supple. No JVD present. No thyromegaly present.  Cardiovascular: Normal rate, regular rhythm, normal heart sounds and intact distal pulses.  Exam reveals no gallop and no friction rub.   No murmur heard. Pulmonary/Chest: Effort normal and breath sounds normal. No respiratory distress. She has no wheezes. She has no rales. She exhibits no tenderness.  Abdominal: Soft. Bowel sounds are normal. She exhibits no distension and no mass. There is no tenderness. There is no rigidity, no rebound, no guarding, no tenderness at McBurney's point and negative Murphy's sign.  Obese abdomen  Musculoskeletal: Normal range of motion.  Lymphadenopathy:    She has no cervical adenopathy.  Neurological: She is alert and oriented to person, place, and time.  Skin: Skin is warm and dry. She is not diaphoretic.  Psychiatric: She has a normal mood and affect. Her behavior is normal. Judgment and thought content normal.  Nursing note and vitals reviewed.   Filed Vitals:   07/02/14 1055  BP: 146/88  Pulse: 92  Temp: 98 F (36.7 C)  Resp: 18   Wt Readings from Last 3 Encounters:  07/02/14 246 lb (111.585 kg)  06/05/14 249 lb 6.4 oz (113.127 kg)  04/26/14  255 lb 9.6 oz (115.939 kg)         Assessment & Plan:  Ddx includes possible gallbladder dysfunction, PUD, pancreatitis, and gastroparesis.  If negative [place] and normal labs may need to refer to GI for further evaluation.    1. Intractable vomiting with nausea, vomiting of unspecified type  - CBC with  Differential/Platelet - BASIC METABOLIC PANEL WITH GFR - Hepatic function panel - Urinalysis, Routine w reflex microscopic - Lipase - Culture, Urine  2. Diarrhea  - CBC with Differential/Platelet - BASIC METABOLIC PANEL WITH GFR - Hepatic function panel - Urinalysis, Routine w reflex microscopic - Lipase - Culture, Urine  3. Abdominal pain, unspecified abdominal location  - CBC with Differential/Platelet - BASIC METABOLIC PANEL WITH GFR - Hepatic function panel - Urinalysis, Routine w reflex microscopic - Lipase - US Abdomen Limited RUQ; Future

## 2014-07-03 ENCOUNTER — Other Ambulatory Visit: Payer: Self-pay | Admitting: Internal Medicine

## 2014-07-03 LAB — URINE CULTURE: Colony Count: 40000

## 2014-07-03 LAB — URINALYSIS, MICROSCOPIC ONLY
CASTS: NONE SEEN
Crystals: NONE SEEN

## 2014-07-03 LAB — URINALYSIS, ROUTINE W REFLEX MICROSCOPIC
BILIRUBIN URINE: NEGATIVE
Glucose, UA: NEGATIVE mg/dL
Ketones, ur: NEGATIVE mg/dL
Nitrite: NEGATIVE
Protein, ur: NEGATIVE mg/dL
Specific Gravity, Urine: <1.005 — ABNORMAL LOW (ref 1.005–1.030)
UROBILINOGEN UA: 0.2 mg/dL (ref 0.0–1.0)
pH: 5.5 (ref 5.0–8.0)

## 2014-07-03 MED ORDER — FOSFOMYCIN TROMETHAMINE 3 G PO PACK: PACK | ORAL | Status: DC

## 2014-07-11 ENCOUNTER — Ambulatory Visit (HOSPITAL_COMMUNITY)
Admission: RE | Admit: 2014-07-11 | Discharge: 2014-07-11 | Disposition: A | Discharge status: Home / Self Care | Payer: 59 | Source: Ambulatory Visit | Attending: Internal Medicine | Admitting: Internal Medicine

## 2014-07-11 DIAGNOSIS — R109 Unspecified abdominal pain: Secondary | ICD-10-CM | POA: Diagnosis present

## 2014-07-11 DIAGNOSIS — R197 Diarrhea, unspecified: Secondary | ICD-10-CM | POA: Insufficient documentation

## 2014-07-11 DIAGNOSIS — R110 Nausea: Secondary | ICD-10-CM | POA: Insufficient documentation

## 2014-07-12 ENCOUNTER — Other Ambulatory Visit: Payer: Self-pay | Admitting: Internal Medicine

## 2014-07-30 ENCOUNTER — Other Ambulatory Visit: Payer: Self-pay | Admitting: Internal Medicine

## 2014-08-15 ENCOUNTER — Ambulatory Visit (INDEPENDENT_AMBULATORY_CARE_PROVIDER_SITE_OTHER): Payer: 59 | Admitting: Physician Assistant

## 2014-08-15 ENCOUNTER — Encounter: Payer: Self-pay | Admitting: Physician Assistant

## 2014-08-15 VITALS — BP 126/78 | HR 84 | Temp 98.2°F | Resp 16 | Ht 64.25 in | Wt 249.0 lb

## 2014-08-15 DIAGNOSIS — Z79899 Other long term (current) drug therapy: Secondary | ICD-10-CM

## 2014-08-15 DIAGNOSIS — I10 Essential (primary) hypertension: Secondary | ICD-10-CM

## 2014-08-15 DIAGNOSIS — E559 Vitamin D deficiency, unspecified: Secondary | ICD-10-CM

## 2014-08-15 DIAGNOSIS — R197 Diarrhea, unspecified: Secondary | ICD-10-CM

## 2014-08-15 DIAGNOSIS — E782 Mixed hyperlipidemia: Secondary | ICD-10-CM

## 2014-08-15 DIAGNOSIS — E119 Type 2 diabetes mellitus without complications: Secondary | ICD-10-CM

## 2014-08-15 NOTE — Patient Instructions (Addendum)
Stop taking the levsin and reglan together Levsin is good for diarrhea and SLOWs down your intestintes Reglan is good for nausea/constipation and can take 20-30 mins before food.   Can get on probiotic to help get back good bacteria  Ova and Parasite Exam This is a microscopic exam of your stool to determine whether you have a parasite infecting your gastrointestinal tract. It is sometimes done if you have diarrhea that lasts more than a few days and/or have blood or mucus in your loose stools, especially if you drank stream or lake water. Most people who are infected by parasites become infected by drinking water or eating food that has been contaminated with the ova. This contamination cannot be seen - the food and water will look, smell, and taste completely normal. The most common parasites are three single cell parasites: Giardia lamblia, Entamoeba histolytica (E. histolytica), and Cryptosporidium parvum. They are found in mountain streams and lakes throughout the world and may infect swimming pools, hot tubs, and occasionally community water supplies.  SAMPLE COLLECTION A fresh stool sample is collected in a clean container (or on a clean surface). The stool sample should not be contaminated with urine or water. Once it has been collected the stool is usually taken to the laboratory within about an hour after collection, or is transferred into bottles containing different preservative solutions.  Once the stool has been preserved, it should be returned to the laboratory within a day or so. If your caregiver has ordered multiple samples, you may collect and transfer all of the stool samples into preservative bottles before returning them to the laboratory. When multiple samples are ordered, they are collected at different times, often on different days. The bottles should be labeled with the patient's name and the date and time of the stool collection.  NORMAL FINDINGS Your stool specimen should  test negative for ova and parasites. Ranges for normal findings may vary among different laboratories and hospitals. You should always check with your doctor after having lab work or other tests done to discuss the meaning of your test results and whether your values are considered within normal limits. MEANING OF TEST  Your caregiver will go over the test results with you and discuss the importance and meaning of your results, as well as treatment options and the need for additional tests if necessary. OBTAINING THE TEST RESULTS It is your responsibility to obtain your test results. Ask the lab or department performing the test when and how you will get your results. Document Released: 03/28/2004 Document Revised: 05/18/2011 Document Reviewed: 02/04/2008 [facility] Patient Information 2015 [name], [name]. This information is not intended to replace advice given to you by your health care provider. Make sure you discuss any questions you have with your health care provider.  Before you even begin to attack a weight-loss plan, it pays to remember this: You are not fat. You have fat. Losing weight isn't about blame or shame; it's simply another achievement to accomplish. Dieting is like any other skill-you have to buckle down and work at it. As long as you act in a smart, reasonable way, you'll ultimately get where you want to be. Here are some weight loss pearls for you.  1. It's Not a Diet. It's a Lifestyle Thinking of a diet as something you're on and suffering through only for the short term doesn't work. To shed weight and keep it off, you need to make permanent changes to the way you eat. It's OK  to indulge occasionally, of course, but if you cut calories temporarily and then revert to your old way of eating, you'll gain back the weight quicker than you can say yo-yo. Use it to lose it. Research shows that one of the best predictors of long-term weight loss is how many pounds you drop in the first  month. For that reason, nutritionists often suggest being stricter for the first two weeks of your new eating strategy to build momentum. Cut out added sugar and alcohol and avoid unrefined carbs. After that, figure out how you can reincorporate them in a way that's healthy and maintainable.  2. There's a Right Way to Exercise Working out burns calories and fat and boosts your metabolism by building muscle. But those trying to lose weight are notorious for overestimating the number of calories they burn and underestimating the amount they take in. Unfortunately, your system is biologically programmed to hold on to extra pounds and that means when you start exercising, your body senses the deficit and ramps up its hunger signals. If you're not diligent, you'll eat everything you burn and then some. Use it to lose it. Cardio gets all the exercise glory, but strength and interval training are the real heroes. They help you build lean muscle, which in turn increases your metabolism and calorie-burning ability 3. Don't Overreact to Mild Hunger Some people have a hard time losing weight because of hunger anxiety. To them, being hungry is bad-something to be avoided at all costs-so they carry snacks with them and eat when they don't need to. Others eat because they're stressed out or bored. While you never want to get to the point of being ravenous (that's when bingeing is likely to happen), a hunger pang, a craving, or the fact that it's 3:00 p.m. should not send you racing for the vending machine or obsessing about the energy bar in your purse. Ideally, you should put off eating until your stomach is growling and it's difficult to concentrate.  Use it to lose it. When you feel the urge to eat, use the HALT method. Ask yourself, Am I really hungry? Or am I angry or anxious, lonely or bored, or tired? If you're still not certain, try the apple test. If you're truly hungry, an apple should seem delicious; if it  doesn't, something else is going on. Or you can try drinking water and making yourself busy, if you are still hungry try a healthy snack.  4. Not All Calories Are Created Equal The mechanics of weight loss are pretty simple: Take in fewer calories than you use for energy. But the kind of food you eat makes all the difference. Processed food that's high in saturated fat and refined starch or sugar can cause inflammation that disrupts the hormone signals that tell your brain you're full. The result: You eat a lot more.  Use it to lose it. Clean up your diet. Swap in whole, unprocessed foods, including vegetables, lean protein, and healthy fats that will fill you up and give you the biggest nutritional bang for your calorie buck. In a few weeks, as your brain starts receiving regular hunger and fullness signals once again, you'll notice that you feel less hungry overall and naturally start cutting back on the amount you eat.  5. Protein, Produce, and Plant-Based Fats Are Your Weight-Loss Trinity Here's why eating the three Ps regularly will help you drop pounds. Protein fills you up. You need it to build lean muscle, which keeps your  metabolism humming so that you can torch more fat. People in a weight-loss program who ate double the recommended daily allowance for protein (about 110 grams for a 150-pound woman) lost 70 percent of their weight from fat, while people who ate the RDA lost only about 40 percent, one study found. Produce is packed with filling fiber. "It's very difficult to consume too many calories if you're eating a lot of vegetables. Example: Three cups of broccoli is a lot of food, yet only 93 calories. (Fruit is another story. It can be easy to overeat and can contain a lot of calories from sugar, so be sure to monitor your intake.) Plant-based fats like olive oil and those in avocados and nuts are healthy and extra satiating.  Use it to lose it. Aim to incorporate each of the three Ps into  every meal and snack. People who eat protein throughout the day are able to keep weight off, according to a study in the [place] of Clinical Nutrition. In addition to meat, poultry and seafood, good sources are beans, lentils, eggs, tofu, and yogurt. As for fat, keep portion sizes in check by measuring out salad dressing, oil, and nut butters (shoot for one to two tablespoons). Finally, eat veggies or a little fruit at every meal. People who did that consumed 308 fewer calories but didn't feel any hungrier than when they didn't eat more produce.  7. How You Eat Is As Important As What You Eat In order for your brain to register that you're full, you need to focus on what you're eating. Sit down whenever you eat, preferably at a table. Turn off the TV or computer, put down your phone, and look at your food. Smell it. Chew slowly, and don't put another bite on your fork until you swallow. When women ate lunch this attentively, they consumed 30 percent less when snacking later than those who listened to an audiobook at lunchtime, according to a study in the [place] of Nutrition. 8. Weighing Yourself Really Works The scale provides the best evidence about whether your efforts are paying off. Seeing the numbers tick up or down or stagnate is motivation to keep going-or to rethink your approach. A 2015 study at [facility] found that daily weigh-ins helped people lose more weight, keep it off, and maintain that loss, even after two years. Use it to lose it. Step on the scale at the same time every day for the best results. If your weight shoots up several pounds from one weigh-in to the next, don't freak out. Eating a lot of salt the night before or having your period is the likely culprit. The number should return to normal in a day or two. It's a steady climb that you need to do something about. 9. Too Much Stress and Too Little Sleep Are Your Enemies When you're tired and frazzled, your  body cranks up the production of cortisol, the stress hormone that can cause carb cravings. Not getting enough sleep also boosts your levels of ghrelin, a hormone associated with hunger, while suppressing leptin, a hormone that signals fullness and satiety. People on a diet who slept only five and a half hours a night for two weeks lost 55 percent less fat and were hungrier than those who slept eight and a half hours, according to a study in the [place]. Use it to lose it. Prioritize sleep, aiming for seven hours or more a night, which research shows helps  lower stress. And make sure you're getting quality zzz's. If a snoring spouse or a fidgety cat wakes you up frequently throughout the night, you may end up getting the equivalent of just four hours of sleep, according to a study from [facility]. Keep pets out of the bedroom, and use a white-noise app to drown out snoring. 10. You Will Hit a plateau-And You Can Bust Through It As you slim down, your body releases much less leptin, the fullness hormone.  If you're not strength training, start right now. Building muscle can raise your metabolism to help you overcome a plateau. To keep your body challenged and burning calories, incorporate new moves and more intense intervals into your workouts or add another sweat session to your weekly routine. Alternatively, cut an extra 100 calories or so a day from your diet. Now that you've lost weight, your body simply doesn't need as much fuel.   Ways to cut 100 calories  1. Eat your eggs with hot sauce OR salsa instead of cheese.  Eggs are great for breakfast, but many people consider eggs and cheese to be BFFs. Instead of cheese-1 oz. of cheddar has 114 calories-top your eggs with hot sauce, which contains no calories and helps with satiety and metabolism. Salsa is also a great option!!  2. Top your toast, waffles or pancakes with mashed berries instead of jelly or syrup. Half a  cup of berries-fresh, frozen or thawed-has about 40 calories, compared with 2 tbsp. of maple syrup or jelly, which both have about 100 calories. The berries will also give you a good punch of fiber, which helps keep you full and satisfied and won't spike blood sugar quickly like the jelly or syrup. 3. Swap the non-fat latte for black coffee with a splash of half-and-half. Contrary to its name, that non-fat latte has 130 calories and a startling 19g of carbohydrates per 16 oz. serving. Replacing that 'light' drinkable dessert with a black coffee with a splash of half-and-half saves you more than 100 calories per 16 oz. serving. 4. Sprinkle salads with freeze-dried raspberries instead of dried cranberries. If you want a sweet addition to your nutritious salad, stay away from dried cranberries. They have a whopping 130 calories per  cup and 30g carbohydrates. Instead, sprinkle freeze-dried raspberries guilt-free and save more than 100 calories per  cup serving, adding 3g of belly-filling fiber. 5. Go for mustard in place of mayo on your sandwich. Mustard can add really nice flavor to any sandwich, and there are tons of varieties, from spicy to honey. A serving of mayo is 95 calories, versus 10 calories in a serving of mustard. 6. Choose a DIY salad dressing instead of the store-bought kind. Mix Dijon or whole grain mustard with low-fat Kefir or red wine vinegar and garlic. 7. Use hummus as a spread instead of a dip. Use hummus as a spread on a high-fiber cracker or tortilla with a sandwich and save on calories without sacrificing taste. 8. Pick just one salad "accessory." Salad isn't automatically a calorie winner. It's easy to over-accessorize with toppings. Instead of topping your salad with nuts, avocado and cranberries (all three will clock in at 313 calories), just pick one. The next day, choose a different accessory, which will also keep your salad interesting. You don't wear all your jewelry every  day, right? 9. Ditch the white pasta in favor of spaghetti squash. One cup of cooked spaghetti squash has about 40 calories, compared with traditional spaghetti, which  comes with more than 200. Spaghetti squash is also nutrient-dense. It's a good source of fiber and Vitamins A and C, and it can be eaten just like you would eat pasta-with a great tomato sauce and [place] meatballs or with pesto, tofu and spinach, for example. 10. Dress up your chili, soups and stews with non-fat [place] yogurt instead of sour cream. Just a 'dollop' of sour cream can set you back 115 calories and a whopping 12g of fat-seven of which are of the artery-clogging variety. Added bonus: [place] yogurt is packed with muscle-building protein, calcium and B Vitamins. 11. Mash cauliflower instead of mashed potatoes. One cup of traditional mashed potatoes-in all their creamy goodness-has more than 200 calories, compared to mashed cauliflower, which you can typically eat for less than 100 calories per 1 cup serving. Cauliflower is a great source of the antioxidant indole-3-carbinol (I3C), which may help reduce the risk of some cancers, like breast cancer. 12. Ditch the ice cream sundae in favor of a [place] yogurt parfait. Instead of a cup of ice cream or fro-yo for dessert, try 1 cup of nonfat Greek yogurt topped with fresh berries and a sprinkle of cacao nibs. Both toppings are packed with antioxidants, which can help reduce cellular inflammation and oxidative damage. And the comparison is a no-brainer: One cup of ice cream has about 275 calories; one cup of frozen yogurt has about 230; and a cup of Greek yogurt has just 130, plus twice the protein, so you're less likely to return to the freezer for a second helping. 13. Put olive oil in a spray container instead of using it directly from the bottle. Each tablespoon of olive oil is 120 calories and 15g of fat. Use a mister instead of pouring it straight into the pan or onto a salad. This  allows for portion control and will save you more than 100 calories. 14. When baking, substitute canned pumpkin for butter or oil. Canned pumpkin-not pumpkin pie mix-is loaded with Vitamin A, which is important for skin and eye health, as well as immunity. And the comparisons are pretty crazy:  cup of canned pumpkin has about 40 calories, compared to butter or oil, which has more than 800 calories. Yes, 800 calories. Applesauce and mashed banana can also serve as good substitutions for butter or oil, usually in a 1:1 ratio. 15. Top casseroles with high-fiber cereal instead of breadcrumbs. Breadcrumbs are typically made with white bread, while breakfast cereals contain 5-9g of fiber per serving. Not only will you save more than 150 calories per  cup serving, the swap will also keep you more full and you'll get a metabolism boost from the added fiber. 16. Snack on pistachios instead of macadamia nuts. Believe it or not, you get the same amount of calories from 35 pistachios (100 calories) as you would from only five macadamia nuts. 17. Chow down on kale chips rather than potato chips. This is my favorite 'don't knock it 'till you try it' swap. Kale chips are so easy to make at home, and you can spice them up with a little grated parmesan or chili powder. Plus, they're a mere fraction of the calories of potato chips, but with the same crunch factor we crave so often. 18. Add seltzer and some fruit slices to your cocktail instead of soda or fruit juice. One cup of soda or fruit juice can pack on as much as 140 calories. Instead, use seltzer and fruit slices. The fruit provides valuable phytochemicals, such  as flavonoids and anthocyanins, which help to combat cancer and stave off the aging process.

## 2014-08-15 NOTE — Progress Notes (Signed)
Assessment and Plan:  Hypertension: Continue medication, monitor blood pressure at home. Continue DASH diet.  Reminder to go to the ER if any CP, SOB, nausea, dizziness, severe HA, changes vision/speech, left arm numbness and tingling and jaw pain. Cholesterol: Continue diet and exercise. Check cholesterol.  Diabetes-Continue diet and exercise. Check A1C Vitamin D Def- check level and continue medications.  Obesity with co morbidities- long discussion about weight loss, diet, and exercise Smoking cessation- discussed with patient, understands risk of MI, stroke, cancer, and death.  Diarrhea/nausea- get stool samples, with reoccurrence and recent travel, get on probiotic  Continue diet and meds as discussed. Further disposition pending results of labs. Discussed med's effects and SE's. Follow up 3 months.  Future Appointments Date Time Provider [facility]  11/19/2014 2:30 PM [name], MD [facility] None  12/11/2014 1:00 PM Regina Eck, CNM [facility] None  05/15/2015 2:00 PM [name], MD [facility] None      HPI 64 y.o. female  presents for 3 month follow up with hypertension, hyperlipidemia, diabetes and vitamin D. Her blood pressure has been controlled at home, today their BP is BP: 126/78 mmHg She does not workout. She denies chest pain, shortness of breath, dizziness.  She is on cholesterol medication and denies myalgias. Her cholesterol is at goal. The cholesterol last visit was:   Lab Results  Component Value Date   CHOL 173 04/26/2014   HDL 63 04/26/2014   LDLCALC 83 04/26/2014   TRIG 133 04/26/2014   CHOLHDL 2.7 04/26/2014   She has been working on diet and exercise for Diabetes, she is on bASA, ARB, she is on phentermine, metformin and has done a good job with weight loss and bringing down her sugars, and denies polydipsia and polyuria. Last A1C in the office was:  Lab Results  Component Value Date   HGBA1C 6.3* 04/26/2014   Patient is on Vitamin D  supplement. Lab Results  Component Value Date   VD25OH 48 04/26/2014   She is smoking still but she has been cutting back.  She has an area on her nose she is concerned with.  She was seen in March and April for nausea and diarrhea, she had a normal AB [place] and labs. She did go to [place] in April, had diarrhea there, has been having intermittent nausea and now constipation, occ having to do manual fecal disimpaction with diarrhea. She has been taking the levsin and reglan. She had normal colonoscopy 10/2012.   BMI is Body mass index is 42.41 kg/(m^2)., she is working on diet and exercise. Wt Readings from Last 3 Encounters:  08/15/14 249 lb (112.946 kg)  07/02/14 246 lb (111.585 kg)  06/05/14 249 lb 6.4 oz (113.127 kg)    Current Medications:  Current Outpatient Prescriptions on File Prior to Visit  Medication Sig Dispense Refill  . ACCU-CHEK AVIVA PLUS test strip USE AS DIRECTED 300 each 6  . albuterol (PROVENTIL HFA;VENTOLIN HFA) 108 (90 BASE) MCG/ACT inhaler Inhale 2 puffs into the lungs every 6 (six) hours as needed for wheezing.    Marland Kitchen ALPRAZolam (NIRAVAM) 0.25 MG dissolvable tablet Take 0.25 mg by mouth 2 (two) times daily.     Marland Kitchen aspirin 81 MG tablet Take 81 mg by mouth daily.    Marland Kitchen BENICAR HCT 40-12.5 MG per tablet TAKE 1 TABLET BY MOUTH ONCE DAILY 90 tablet PRN  . Cyanocobalamin (VITAMIN B-12 PO) Take 100 mg by mouth daily.     . cyclobenzaprine (FLEXERIL) 10 MG  tablet Take 1/2 to 1 tablet by  mouth 3 times daily for  muscle spasms 270 tablet 2  . dextromethorphan-guaiFENesin (MUCINEX DM) 30-600 MG per 12 hr tablet Take 1 tablet by mouth every 12 (twelve) hours.    . fexofenadine (ALLEGRA) 180 MG tablet Take 180 mg by mouth daily.    . fluticasone (FLONASE) 50 MCG/ACT nasal spray Place 2 sprays into the nose daily.    . fosfomycin (MONUROL) 3 G PACK Mix with an 6-8 oz glass of water and drink all the liquid.  Please take phenergan 30 minutes prior to taking 1 packet 0  .  hyoscyamine (LEVBID) 0.375 MG 12 hr tablet TAKE 1 TABLET BY MOUTH TWICE DAILY 180 tablet 98  . MELATONIN PO Take 5 mg by mouth at bedtime.     . metFORMIN (GLUCOPHAGE-XR) 500 MG 24 hr tablet TAKE 1 TABLET BY MOUTH AT BREAKFAST AND LUNCH AND 2 BY MOUTH AT SUPPER 360 tablet 2  . metoCLOPramide (REGLAN) 10 MG tablet TAKE 1 TABLET BY MOUTH FOUR TIMES DAILY BEFORE A MEAL AND EVERY NIGHT AT BEDTIME FOR ACID REFLUX 360 tablet PRN  . montelukast (SINGULAIR) 10 MG tablet TAKE 1 TABLET BY MOUTH EVERY DAY 90 tablet 3  . montelukast (SINGULAIR) 10 MG tablet TAKE 1 TABLET BY MOUTH EVERY DAY 90 tablet 3  . nystatin cream (MYCOSTATIN) Apply 1 application topically 2 (two) times daily. Apply to affected area BID for up to 7 days. 30 g 0  . ondansetron (ZOFRAN ODT) 4 MG disintegrating tablet 4mg  ODT q4 hours prn nausea/vomit 30 tablet 0  . OVER THE COUNTER MEDICATION Iron 325 mg daily    . OVER THE COUNTER MEDICATION Vitamin C with cranberry 1 daily.    . pantoprazole (PROTONIX) 40 MG tablet Take 1 tablet by mouth  daily for acid indigestion 90 tablet 4  . promethazine (PHENERGAN) 25 MG tablet Take 1 tablet (25 mg total) by mouth every 6 (six) hours as needed for nausea or vomiting. 60 tablet 0  . simvastatin (ZOCOR) 40 MG tablet Take 1 tablet by mouth  every evening 90 tablet 4  . venlafaxine (EFFEXOR) 75 MG tablet Take 75 mg by mouth 2 (two) times daily.     . zaleplon (SONATA) 10 MG capsule Take 10 mg by mouth at bedtime as needed.     No current facility-administered medications on file prior to visit.   Medical History:  Past Medical History  Diagnosis Date  . IBS (irritable bowel syndrome) 12/89  . Hypertension 1/97  . Fibroids, intramural 01/24/2001    Confirmed by PUS  . Bell's palsy 01/1985  . Menopausal state 04/2009    At risk for endo hyperplasia  . Obesity, Class III, BMI 40-49.9 (morbid obesity)   . Type II or unspecified type diabetes mellitus without mention of complication, not stated as  uncontrolled   . Depression 2/90  . Anxiety   . Hiatal hernia 12/89  . Vitamin D deficiency   . OSA (obstructive sleep apnea)    Allergies:  Allergies  Allergen Reactions  . Penicillins Rash and Anaphylaxis  . Sulfa Antibiotics Shortness Of Breath    Also light headed, rash  . Celebrex [Celecoxib]     Elevates Blood Pressure   . Ciprofloxacin Nausea And Vomiting  . Codeine Nausea And Vomiting    And hydrocodone  . Levaquin [Levofloxacin] Nausea And Vomiting  . Motrin Ib [Ibuprofen] Hives  . Prempro [Conj Estrog-Medroxyprogest Ace] Hives    Review  of Systems  Constitutional: Negative for fever and chills.  HENT: Negative.   Respiratory: Negative.  Negative for shortness of breath.   Cardiovascular: Negative.   Gastrointestinal: Positive for nausea, diarrhea and constipation. Negative for heartburn, vomiting, abdominal pain, blood in stool and melena.  Genitourinary: Negative for dysuria, urgency, frequency and hematuria.  Musculoskeletal: Positive for back pain. Negative for myalgias, joint pain, falls and neck pain.  Skin: Negative.   Neurological: Negative.  Negative for dizziness and headaches.  Psychiatric/Behavioral: Negative.      Family history- Review and unchanged Social history- Review and unchanged Physical Exam: BP 126/78 mmHg  Pulse 84  Temp(Src) 98.2 F (36.8 C) (Temporal)  Resp 16  Ht 5' 4.25" (1.632 m)  Wt 249 lb (112.946 kg)  BMI 42.41 kg/m2  LMP 04/09/2009 Wt Readings from Last 3 Encounters:  08/15/14 249 lb (112.946 kg)  07/02/14 246 lb (111.585 kg)  06/05/14 249 lb 6.4 oz (113.127 kg)   General Appearance: Well nourished, in no apparent distress. Eyes: PERRLA, EOMs, conjunctiva no swelling or erythema Sinuses: + Frontal/maxillary tenderness ENT/Mouth: Ext aud canals clear, TMs without erythema, bulging. No erythema, swelling, or exudate on post pharynx.  Tonsils not swollen or erythematous. Hearing normal.  Neck: Supple, thyroid normal.   Respiratory: Respiratory effort normal, BS equal bilaterally with mild expiratory wheeze without rales, rhonchi,  or stridor.  Cardio: RRR with no MRGs. Brisk peripheral pulses with mild bilateral edema.  Abdomen: Soft, + BS, obese  Mild diffuse lower AB tenderness, no guarding, rebound, hernias, masses. Lymphatics: Non tender without lymphadenopathy.  Musculoskeletal: Full ROM, 5/5 strength, antalgic gait walking with cane Skin: Warm, dry without rashes, lesions, ecchymosis.  Neuro: Cranial nerves intact. No cerebellar symptoms. Sensation intact.  Psych: Awake and oriented X 3, normal affect, Insight and Judgment appropriate.    Vicie Mutters, PA-C 2:55 PM [facility]

## 2014-08-16 LAB — CBC WITH DIFFERENTIAL/PLATELET
BASOS ABS: 0.1 10*3/uL (ref 0.0–0.1)
BASOS PCT: 1 % (ref 0–1)
EOS ABS: 0.2 10*3/uL (ref 0.0–0.7)
EOS PCT: 3 % (ref 0–5)
HCT: 36.8 % (ref 36.0–46.0)
Hemoglobin: 11.9 g/dL — ABNORMAL LOW (ref 12.0–15.0)
LYMPHS ABS: 1.8 10*3/uL (ref 0.7–4.0)
Lymphocytes Relative: 28 % (ref 12–46)
MCH: 27.1 pg (ref 26.0–34.0)
MCHC: 32.3 g/dL (ref 30.0–36.0)
MCV: 83.8 fL (ref 78.0–100.0)
MONO ABS: 0.5 10*3/uL (ref 0.1–1.0)
MPV: 9.9 fL (ref 8.6–12.4)
Monocytes Relative: 7 % (ref 3–12)
NEUTROS ABS: 4 10*3/uL (ref 1.7–7.7)
Neutrophils Relative %: 61 % (ref 43–77)
Platelets: 232 10*3/uL (ref 150–400)
RBC: 4.39 MIL/uL (ref 3.87–5.11)
RDW: 15.9 % — ABNORMAL HIGH (ref 11.5–15.5)
WBC: 6.5 10*3/uL (ref 4.0–10.5)

## 2014-08-16 LAB — LIPID PANEL
Cholesterol: 162 mg/dL (ref 0–200)
HDL: 50 mg/dL (ref >=46)
LDL Cholesterol: 94 mg/dL (ref 0–99)
TRIGLYCERIDES: 89 mg/dL (ref <150)
Total CHOL/HDL Ratio: 3.2 Ratio
VLDL: 18 mg/dL (ref 0–40)

## 2014-08-16 LAB — BASIC METABOLIC PANEL WITH GFR
BUN: 16 mg/dL (ref 6–23)
CALCIUM: 9.3 mg/dL (ref 8.4–10.5)
CO2: 28 meq/L (ref 19–32)
Chloride: 101 mEq/L (ref 96–112)
Creat: 0.75 mg/dL (ref 0.50–1.10)
GFR, Est Non African American: 86 mL/min
Glucose, Bld: 93 mg/dL (ref 70–99)
POTASSIUM: 4.8 meq/L (ref 3.5–5.3)
SODIUM: 137 meq/L (ref 135–145)

## 2014-08-16 LAB — HEPATIC FUNCTION PANEL
ALT: 12 U/L (ref 0–35)
AST: 16 U/L (ref 0–37)
Albumin: 3.7 g/dL (ref 3.5–5.2)
Alkaline Phosphatase: 122 U/L — ABNORMAL HIGH (ref 39–117)
Bilirubin, Direct: 0.1 mg/dL (ref 0.0–0.3)
Indirect Bilirubin: 0.2 mg/dL (ref 0.2–1.2)
Total Bilirubin: 0.3 mg/dL (ref 0.2–1.2)
Total Protein: 6.6 g/dL (ref 6.0–8.3)

## 2014-08-16 LAB — INSULIN, FASTING: INSULIN FASTING, SERUM: 11.4 u[IU]/mL (ref 2.0–19.6)

## 2014-08-16 LAB — TSH: TSH: 1.99 u[IU]/mL (ref 0.350–4.500)

## 2014-08-16 LAB — MAGNESIUM: Magnesium: 1.7 mg/dL (ref 1.5–2.5)

## 2014-08-16 LAB — HEMOGLOBIN A1C
HEMOGLOBIN A1C: 6.1 % — AB (ref <5.7)
Mean Plasma Glucose: 128 mg/dL — ABNORMAL HIGH (ref <117)

## 2014-08-17 LAB — VITAMIN D 25 HYDROXY (VIT D DEFICIENCY, FRACTURES): VIT D 25 HYDROXY: 64 ng/mL (ref 30–100)

## 2014-08-21 ENCOUNTER — Other Ambulatory Visit: Payer: 59

## 2014-08-21 DIAGNOSIS — R197 Diarrhea, unspecified: Secondary | ICD-10-CM

## 2014-08-22 LAB — GASTROINTESTINAL PATHOGEN PANEL PCR
C. difficile Tox A/B, PCR: NEGATIVE
CAMPYLOBACTER, PCR: NEGATIVE
Cryptosporidium, PCR: NEGATIVE
E COLI (ETEC) LT/ST, PCR: NEGATIVE
E COLI 0157, PCR: NEGATIVE
E coli (STEC) stx1/stx2, PCR: NEGATIVE
Giardia lamblia, PCR: NEGATIVE
NOROVIRUS, PCR: NEGATIVE
Rotavirus A, PCR: NEGATIVE
Salmonella, PCR: NEGATIVE
Shigella, PCR: NEGATIVE

## 2014-08-23 ENCOUNTER — Encounter: Payer: Self-pay | Admitting: Physician Assistant

## 2014-09-03 ENCOUNTER — Other Ambulatory Visit: Payer: Self-pay

## 2014-09-24 ENCOUNTER — Other Ambulatory Visit: Payer: Self-pay | Admitting: Internal Medicine

## 2014-10-07 ENCOUNTER — Other Ambulatory Visit: Payer: Self-pay | Admitting: Internal Medicine

## 2014-10-23 ENCOUNTER — Other Ambulatory Visit: Payer: Self-pay | Admitting: Internal Medicine

## 2014-11-19 ENCOUNTER — Ambulatory Visit (INDEPENDENT_AMBULATORY_CARE_PROVIDER_SITE_OTHER): Payer: 59 | Admitting: Internal Medicine

## 2014-11-19 ENCOUNTER — Encounter: Payer: Self-pay | Admitting: Internal Medicine

## 2014-11-19 VITALS — BP 126/78 | HR 84 | Temp 97.5°F | Resp 18 | Ht 64.25 in | Wt 255.6 lb

## 2014-11-19 DIAGNOSIS — Z79899 Other long term (current) drug therapy: Secondary | ICD-10-CM

## 2014-11-19 DIAGNOSIS — E782 Mixed hyperlipidemia: Secondary | ICD-10-CM

## 2014-11-19 DIAGNOSIS — I10 Essential (primary) hypertension: Secondary | ICD-10-CM | POA: Diagnosis not present

## 2014-11-19 DIAGNOSIS — K219 Gastro-esophageal reflux disease without esophagitis: Secondary | ICD-10-CM

## 2014-11-19 DIAGNOSIS — G4733 Obstructive sleep apnea (adult) (pediatric): Secondary | ICD-10-CM

## 2014-11-19 DIAGNOSIS — K589 Irritable bowel syndrome without diarrhea: Secondary | ICD-10-CM | POA: Diagnosis not present

## 2014-11-19 DIAGNOSIS — E662 Morbid (severe) obesity with alveolar hypoventilation: Secondary | ICD-10-CM

## 2014-11-19 DIAGNOSIS — E66813 Obesity, class 3: Secondary | ICD-10-CM

## 2014-11-19 DIAGNOSIS — E559 Vitamin D deficiency, unspecified: Secondary | ICD-10-CM

## 2014-11-19 DIAGNOSIS — E119 Type 2 diabetes mellitus without complications: Secondary | ICD-10-CM

## 2014-11-19 DIAGNOSIS — J449 Chronic obstructive pulmonary disease, unspecified: Secondary | ICD-10-CM | POA: Insufficient documentation

## 2014-11-19 HISTORY — DX: Morbid (severe) obesity with alveolar hypoventilation: E66.2

## 2014-11-19 LAB — LIPID PANEL
CHOLESTEROL: 159 mg/dL (ref 125–200)
HDL: 54 mg/dL (ref >=46)
LDL Cholesterol: 86 mg/dL (ref <130)
TRIGLYCERIDES: 95 mg/dL (ref <150)
Total CHOL/HDL Ratio: 2.9 Ratio (ref <=5.0)
VLDL: 19 mg/dL (ref <30)

## 2014-11-19 LAB — CBC WITH DIFFERENTIAL/PLATELET
BASOS PCT: 1 % (ref 0–1)
Basophils Absolute: 0.1 10*3/uL (ref 0.0–0.1)
Eosinophils Absolute: 0.2 10*3/uL (ref 0.0–0.7)
Eosinophils Relative: 2 % (ref 0–5)
HEMATOCRIT: 37.3 % (ref 36.0–46.0)
HEMOGLOBIN: 12 g/dL (ref 12.0–15.0)
LYMPHS PCT: 24 % (ref 12–46)
Lymphs Abs: 2 10*3/uL (ref 0.7–4.0)
MCH: 27 pg (ref 26.0–34.0)
MCHC: 32.2 g/dL (ref 30.0–36.0)
MCV: 84 fL (ref 78.0–100.0)
MONOS PCT: 8 % (ref 3–12)
MPV: 9.6 fL (ref 8.6–12.4)
Monocytes Absolute: 0.7 10*3/uL (ref 0.1–1.0)
NEUTROS ABS: 5.3 10*3/uL (ref 1.7–7.7)
Neutrophils Relative %: 65 % (ref 43–77)
Platelets: 254 10*3/uL (ref 150–400)
RBC: 4.44 MIL/uL (ref 3.87–5.11)
RDW: 15.7 % — AB (ref 11.5–15.5)
WBC: 8.2 10*3/uL (ref 4.0–10.5)

## 2014-11-19 LAB — BASIC METABOLIC PANEL WITH GFR
BUN: 14 mg/dL (ref 7–25)
CALCIUM: 9.3 mg/dL (ref 8.6–10.4)
CO2: 29 mmol/L (ref 20–31)
CREATININE: 0.78 mg/dL (ref 0.50–0.99)
Chloride: 100 mmol/L (ref 98–110)
GFR, Est Non African American: 82 mL/min (ref >=60)
GLUCOSE: 96 mg/dL (ref 65–99)
Potassium: 4.3 mmol/L (ref 3.5–5.3)
Sodium: 138 mmol/L (ref 135–146)

## 2014-11-19 LAB — HEPATIC FUNCTION PANEL
ALBUMIN: 3.7 g/dL (ref 3.6–5.1)
ALT: 14 U/L (ref 6–29)
AST: 18 U/L (ref 10–35)
Alkaline Phosphatase: 119 U/L (ref 33–130)
Bilirubin, Direct: 0.1 mg/dL (ref <=0.2)
Indirect Bilirubin: 0.2 mg/dL (ref 0.2–1.2)
Total Bilirubin: 0.3 mg/dL (ref 0.2–1.2)
Total Protein: 6.5 g/dL (ref 6.1–8.1)

## 2014-11-19 LAB — MAGNESIUM: Magnesium: 1.9 mg/dL (ref 1.5–2.5)

## 2014-11-19 LAB — TSH: TSH: 2.458 u[IU]/mL (ref 0.350–4.500)

## 2014-11-19 NOTE — Progress Notes (Signed)
Patient ID: Beth [name], female   DOB: 10/03/1950, 64 y.o.   MRN: [id number]   This very nice 64 y.o. DWF presents for 6 month follow up with Hypertension, Hyperlipidemia, T2_NIDDM and Vitamin D Deficiency.    Patient is treated for HTN  Since 2007. BP has been controlled and today's BP: 126/78 mmHg. Patient has had no complaints of any cardiac type chest pain, palpitations, dyspnea/orthopnea/PND, dizziness, claudication, or dependent edema.   Hyperlipidemia is controlled with diet & meds. Patient denies myalgias or other med SE's. Last Lipids were at goal - Cholesterol 162; HDL 50; LDL 94; Triglycerides 89 on 08/15/2014   Also, the patient has history of Morbid Obesity  (BMI 43.53 ) and consequent T2_NIDDM since 2008 and has had no symptoms of reactive hypoglycemia, diabetic polys, paresthesias or visual blurring.  Last A1c was  Bld 6.1% on 08/15/2014. Patient has been on Phentermine in the past with success and desires to retry it.    Further, the patient also has history of Vitamin D Deficiency of 13 in 2008 and supplements vitamin D without any suspected side-effects. Last vitamin D was y 4 on 08/15/2014.  Medication Sig  . albuterol HFA  inhaler Inhale 2 puffs into the lungs every 6 (six) hours as needed for wheezing.  Marland Kitchen ALPRAZolam (NIRAVAM) 0.25 MG dissolvable tablet Take 0.25 mg by mouth 2 (two) times daily.   Marland Kitchen aspirin 81 MG tablet Take 81 mg by mouth daily.  Marland Kitchen BENICAR HCT 40-12.5 MG per tablet TAKE 1 TABLET BY MOUTH ONCE DAILY  . Cyanocobalamin (VITAMIN B-12 PO) Take 100 mg by mouth daily.   . cyclobenzaprine (FLEXERIL) 10 MG tablet Take 1/2 to 1 tablet by  mouth 3 times daily for  muscle spasms  . dextromethorphan-guaiFENesin (MUCINEX DM) 30-600 MG per 12 hr tablet Take 1 tablet by mouth every 12 (twelve) hours.  . fexofenadine (ALLEGRA) 180 MG tablet Take 180 mg by mouth daily.  . fluticasone (FLONASE) 50 MCG/ACT nasal spray Place 2 sprays into the nose daily.  . fosfomycin (MONUROL) 3 G  PACK Mix with an 6-8 oz glass of water and drink all the liquid.  Please take phenergan 30 minutes prior to taking  . hyoscyamine (LEVBID) 0.375 MG 12 hr tablet TAKE 1 TABLET BY MOUTH TWICE DAILY  . MELATONIN PO Take 5 mg by mouth at bedtime.   . metFORMIN (GLUCOPHAGE-XR) 500 MG 24 hr tablet TAKE 1 TABLET BY MOUTH AT BREAKFAST AND LUNCH AND 2 BY MOUTH AT SUPPER  . metoCLOPramide (REGLAN) 10 MG tablet TAKE 1 TABLET BY MOUTH FOUR TIMES DAILY BEFORE A MEAL AND EVERY NIGHT  . montelukast (SINGULAIR) 10 MG tablet TAKE 1 TABLET BY MOUTH EVERY DAY  . nystatin cream (MYCOSTATIN) Apply 1 application topically 2 (two) times daily. Apply to affected area BID for up to 7 days.  . ondansetron (ZOFRAN ODT) 4 MG  4mg  ODT q4 hours prn nausea/vomit  . OVER THE COUNTER MEDICATION Iron 325 mg daily  . OVER THE COUNTER MEDICATION Vitamin C with cranberry 1 daily.  . pantoprazole (PROTONIX) 40 MG tablet Take 1 tablet by mouth  daily for acid indigestion  . promethazine (PHENERGAN) 25 MG tablet Take 1 tablet (25 mg total) by mouth every 6 (six) hours as needed for nausea or vomiting.  . simvastatin (ZOCOR) 40 MG tablet Take 1 tablet by mouth  every evening  . venlafaxine (EFFEXOR) 75 MG tablet Take 75 mg by mouth 2 (two) times daily.   Marland Kitchen  zaleplon (SONATA) 10 MG capsule Take 10 mg by mouth at bedtime as needed.   Allergies  Allergen Reactions  . Penicillins Rash and Anaphylaxis  . Sulfa Antibiotics Shortness Of Breath    Also light headed, rash  . Celebrex [Celecoxib]     Elevates Blood Pressure   . Ciprofloxacin Nausea And Vomiting  . Codeine Nausea And Vomiting    And hydrocodone  . Levaquin [Levofloxacin] Nausea And Vomiting  . Motrin Ib [Ibuprofen] Hives  . Prempro [Conj Estrog-Medroxyprogest Ace] Hives   PMHx:   Past Medical History  Diagnosis Date  . IBS (irritable bowel syndrome) 12/89  . Bell's palsy 01/1985  . Obesity, Class III, BMI 40-49.9 (morbid obesity)   . Anxiety   . Vitamin D  deficiency   . OSA (obstructive sleep apnea)    Immunization History  Administered Date(s) Administered  . PPD Test 03/21/2013  . Pneumococcal Conjugate-13 01/04/2014  . Pneumococcal Polysaccharide-23 03/09/2004  . Td 03/09/2004  . Tdap 03/09/2005  . Zoster 12/22/2012   Past Surgical History  Procedure Laterality Date  . Colonoscopy  05/2005    Normal, recheck 10 yrs  . 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 Bladder suspension  03/2006, 07/2006, 01/2007    for stress incontinence  . Total knee revision  2014  . Tonsillectomy and adenoidectomy     FHx:    Reviewed / unchanged  SHx:    Reviewed / unchanged  Systems Review:  Constitutional: Denies fever, chills, wt changes, headaches, insomnia, fatigue, night sweats, change in appetite. Eyes: Denies redness, blurred vision, diplopia, discharge, itchy, watery eyes.  ENT: Denies discharge, congestion, post nasal drip, epistaxis, sore throat, earache, hearing loss, dental pain, tinnitus, vertigo, sinus pain, snoring.  CV: Denies chest pain, palpitations, irregular heartbeat, syncope, dyspnea, diaphoresis, orthopnea, PND, claudication or edema. Respiratory: denies cough, dyspnea, DOE, pleurisy, hoarseness, laryngitis, wheezing.  Gastrointestinal: Denies dysphagia, odynophagia, heartburn, reflux, water brash, abdominal pain or cramps, nausea, vomiting, bloating, diarrhea, constipation, hematemesis, melena, hematochezia  or hemorrhoids. Genitourinary: Denies dysuria, frequency, urgency, nocturia, hesitancy, discharge, hematuria or flank pain. Musculoskeletal: Denies arthralgias, myalgias, stiffness, jt. swelling, pain, limping or strain/sprain.  Skin: Denies pruritus, rash, hives, warts, acne, eczema or change in skin lesion(s). Neuro: No weakness, tremor, incoordination, spasms, paresthesia or pain. Psychiatric: Denies confusion, memory loss or sensory loss. Endo: Denies change in weight, skin or hair change.  Heme/Lymph:  No excessive bleeding, bruising or enlarged lymph nodes.  Physical Exam  BP 126/78  Pulse 84  Temp 97.5 F   Resp 18  Ht 5' 4.25"   Wt 255 lb 9.6 oz     BMI 43.53  Appears well nourished and in no distress. Eyes: PERRLA, EOMs, conjunctiva no swelling or erythema. Sinuses: No frontal/maxillary tenderness ENT/Mouth: EAC's clear, TM's nl w/o erythema, bulging. Nares clear w/o erythema, swelling, exudates. Oropharynx clear without erythema or exudates. Oral hygiene is good. Tongue normal, non obstructing. Hearing intact.  Neck: Supple. Thyroid nl. Car 2+/2+ without bruits, nodes or JVD. Chest: Respirations nl with BS clear & equal w/o rales, rhonchi, wheezing or stridor.  Cor: Heart sounds normal w/ regular rate and rhythm without sig. murmurs, gallops, clicks, or rubs. Peripheral pulses normal and equal  without edema.  Abdomen: Soft & bowel sounds normal. Non-tender w/o guarding, rebound, hernias, masses, or organomegaly.  Lymphatics: Unremarkable.  Musculoskeletal: Full ROM all peripheral extremities, joint stability, 5/5 strength, and normal gait.  Skin: Warm, dry without exposed  rashes, lesions or ecchymosis apparent.  Neuro: Cranial nerves intact, reflexes equal bilaterally. Sensory-motor testing grossly intact. Tendon reflexes grossly intact.  Pysch: Alert & oriented x 3.  Insight and judgement nl & appropriate. No ideations.  Assessment and Plan:  1. Hypertension  - TSH  2. Mixed hyperlipidemia  - Lipid panel  3. T2_NIDDM  - Hemoglobin A1c - Insulin, random  4. Vitamin D deficiency  - Vit D  25 hydroxy   5. IBS (irritable bowel syndrome)   6. OSA (obstructive sleep apnea)   7. Gastroesophageal reflux disease   8. Obesity, Class III, BMI 40-49.9 (morbid obesity)  - Rx Phentermine 37.5 #30 x 5 Rf  9. Encounter for long-term (current) use of medications  - CBC with Differential/Platelet - BASIC METABOLIC PANEL WITH GFR - Hepatic function panel -  Magnesium   Recommended regular exercise, BP monitoring, weight control, and discussed med and SE's. Recommended labs to assess and monitor clinical status. Further disposition pending results of labs. Over 30 minutes of exam, counseling, chart review was performed

## 2014-11-19 NOTE — Patient Instructions (Signed)

## 2014-11-20 LAB — INSULIN, RANDOM: INSULIN: 21.5 u[IU]/mL — AB (ref 2.0–19.6)

## 2014-11-20 LAB — HEMOGLOBIN A1C
HEMOGLOBIN A1C: 6.2 % — AB (ref <5.7)
MEAN PLASMA GLUCOSE: 131 mg/dL — AB (ref <117)

## 2014-11-20 LAB — VITAMIN D 25 HYDROXY (VIT D DEFICIENCY, FRACTURES): Vit D, 25-Hydroxy: 57 ng/mL (ref 30–100)

## 2014-11-28 ENCOUNTER — Other Ambulatory Visit: Payer: Self-pay | Admitting: Emergency Medicine

## 2014-12-03 ENCOUNTER — Other Ambulatory Visit: Payer: Self-pay | Admitting: *Deleted

## 2014-12-03 ENCOUNTER — Other Ambulatory Visit: Payer: Self-pay | Admitting: Internal Medicine

## 2014-12-03 DIAGNOSIS — B80 Enterobiasis: Secondary | ICD-10-CM

## 2014-12-03 MED ORDER — IVERMECTIN 3 MG PO TABS: ORAL_TABLET | ORAL | Status: DC

## 2014-12-06 ENCOUNTER — Telehealth: Payer: Self-pay | Admitting: *Deleted

## 2014-12-06 NOTE — Telephone Encounter (Signed)
Pt aware of normal BMD results.

## 2014-12-11 ENCOUNTER — Encounter: Payer: Self-pay | Admitting: Certified Nurse Midwife

## 2014-12-11 ENCOUNTER — Ambulatory Visit (INDEPENDENT_AMBULATORY_CARE_PROVIDER_SITE_OTHER): Payer: 59 | Admitting: Certified Nurse Midwife

## 2014-12-11 VITALS — BP 104/64 | HR 72 | Resp 16 | Ht 64.75 in | Wt 249.0 lb

## 2014-12-11 DIAGNOSIS — N898 Other specified noninflammatory disorders of vagina: Secondary | ICD-10-CM

## 2014-12-11 DIAGNOSIS — N852 Hypertrophy of uterus: Secondary | ICD-10-CM

## 2014-12-11 DIAGNOSIS — Z0000 Encounter for general adult medical examination without abnormal findings: Secondary | ICD-10-CM | POA: Diagnosis not present

## 2014-12-11 DIAGNOSIS — Z01419 Encounter for gynecological examination (general) (routine) without abnormal findings: Secondary | ICD-10-CM

## 2014-12-11 LAB — POCT URINALYSIS DIPSTICK
Bilirubin, UA: NEGATIVE
Blood, UA: NEGATIVE
GLUCOSE UA: NEGATIVE
KETONES UA: NEGATIVE
Leukocytes, UA: NEGATIVE
Nitrite, UA: NEGATIVE
Protein, UA: NEGATIVE
Urobilinogen, UA: NEGATIVE
pH, UA: 5

## 2014-12-11 NOTE — Progress Notes (Signed)
64 y.o. [id number] Divorced  Caucasian Fe here for annual exam. Menopausal no HRT. Denies vaginal bleeding or vaginal dryness. Feels she may have vaginal irritation on labia area. Denies any new personal products, but wears pads most of the time. Patient has urinary stress incontinence with cough only. Still smoking 1/2 pack a day. Sees PCP every 3 month for medication management for choesterol/hypertension and diabetes. Still has flares with IBS, no less intense. Weight loss of 20 pounds, still working on. Patient long time friend her Bishon died 09/02/2022. She is adopting 2 dogs soon to help with loneliness. No other health issues today.  Patient's last menstrual period was 04/09/2009.          Sexually active: No.  The current method of family planning is post menopausal status.    Exercising: Yes.    bicycle Smoker:  yes  Health Maintenance: Pap: 12-05-13 neg HPV HR neg MMG: done 2wks ago, pt to go back for biopsy Colonoscopy:  2014 polyps,f/u 16yrs BMD:   2016 TDaP:  2007 Labs: poct urine-neg Self breast exam: done monthly   reports that she has been smoking Cigarettes.  She has been smoking about 0.50 packs per day. She has never used smokeless tobacco. She reports that she does not drink alcohol or use illicit drugs.  Past Medical History  Diagnosis Date  . IBS (irritable bowel syndrome) 12/89  . Bell's palsy 01/1985  . Obesity, Class III, BMI 40-49.9 (morbid obesity) ([place])   . Anxiety   . Vitamin D deficiency   . OSA (obstructive sleep apnea)     Past Surgical History  Procedure Laterality Date  . Colonoscopy  05/2005    Normal, recheck 10 yrs  . 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 Bladder suspension  03/2006, 07/2006, 01/2007    for stress incontinence  . Total knee revision  2014  . Tonsillectomy and adenoidectomy      Current Outpatient Prescriptions  Medication Sig Dispense Refill  . ACCU-CHEK AVIVA PLUS test strip USE AS DIRECTED 300 each 6  . albuterol  (PROVENTIL HFA;VENTOLIN HFA) 108 (90 BASE) MCG/ACT inhaler Inhale 2 puffs into the lungs every 6 (six) hours as needed for wheezing.    Marland Kitchen ALPRAZolam (XANAX) 0.25 MG tablet TK 1 T PO BID  3  . aspirin 81 MG tablet Take 81 mg by mouth daily.    Marland Kitchen BENICAR HCT 40-12.5 MG per tablet TAKE 1 TABLET BY MOUTH ONCE DAILY 90 tablet PRN  . Cyanocobalamin (VITAMIN B-12 PO) Take 100 mg by mouth daily.     . cyclobenzaprine (FLEXERIL) 10 MG tablet Take 1/2 to 1 tablet by  mouth 3 times daily for  muscle spasms 270 tablet 2  . dextromethorphan-guaiFENesin (MUCINEX DM) 30-600 MG per 12 hr tablet Take 1 tablet by mouth every 12 (twelve) hours.    . fexofenadine (ALLEGRA) 180 MG tablet Take 180 mg by mouth daily.    . fluticasone (FLONASE) 50 MCG/ACT nasal spray Place 2 sprays into the nose daily.    . hyoscyamine (LEVBID) 0.375 MG 12 hr tablet TAKE 1 TABLET BY MOUTH TWICE DAILY (Patient taking differently: daily) 180 tablet 98  . MELATONIN PO Take 5 mg by mouth at bedtime.     . meloxicam (MOBIC) 15 MG tablet   3  . metFORMIN (GLUCOPHAGE-XR) 500 MG 24 hr tablet TAKE 1 TABLET BY MOUTH AT BREAKFAST AND LUNCH AND 2 BY MOUTH AT SUPPER 360 tablet  2  . metoCLOPramide (REGLAN) 10 MG tablet TAKE 1 TABLET BY MOUTH FOUR TIMES DAILY BEFORE A MEAL AND EVERY NIGHT AT BEDTIME FOR ACID REFLUX 360 tablet 99  . montelukast (SINGULAIR) 10 MG tablet TAKE 1 TABLET BY MOUTH EVERY DAY 90 tablet 3  . nystatin cream (MYCOSTATIN) Apply 1 application topically 2 (two) times daily. Apply to affected area BID for up to 7 days. 30 g 0  . OVER THE COUNTER MEDICATION Iron 325 mg daily    . OVER THE COUNTER MEDICATION Vitamin C with cranberry 1 daily.    . pantoprazole (PROTONIX) 40 MG tablet Take 1 tablet by mouth  daily for acid indigestion 90 tablet 4  . phentermine (ADIPEX-P) 37.5 MG tablet TK 1/2 TO  1 T PO QAM FOR DIET  5  . Probiotic Product (PROBIOTIC PO) Take by mouth daily.    . simvastatin (ZOCOR) 40 MG tablet Take 1 tablet by mouth   every evening 90 tablet 4  . venlafaxine XR (EFFEXOR-XR) 150 MG 24 hr capsule TK ONE C PO BID  4  . Vitamin D, Ergocalciferol, (DRISDOL) 50000 UNITS CAPS capsule Take 1 capsule daily or as directed 40 capsule 99  . zaleplon (SONATA) 10 MG capsule Take 10 mg by mouth at bedtime as needed.    . ivermectin (STROMECTOL) 3 MG TABS tablet Take all tabs at once on an empty stomach (Patient not taking: Reported on 12/11/2014) 10 tablet 0   No current facility-administered medications for this visit.    Family History  Problem Relation Age of Onset  . Hypertension Father   . Diabetes Father   . Parkinson's disease Father   . CAD Paternal Grandfather 60    Died MI  . Dementia Mother     ROS:  Pertinent items are noted in HPI.  Otherwise, a comprehensive ROS was negative.  Exam:   BP 104/64 mmHg  Pulse 72  Resp 16  Ht 5' 4.75" (1.645 m)  Wt 249 lb (112.946 kg)  BMI 41.74 kg/m2  LMP 04/09/2009 Height: 5' 4.75" (164.5 cm) Ht Readings from Last 3 Encounters:  12/11/14 5' 4.75" (1.645 m)  11/19/14 5' 4.25" (1.632 m)  08/15/14 5' 4.25" (1.632 m)    General appearance: alert, cooperative and appears stated age Head: Normocephalic, without obvious abnormality, atraumatic Neck: no adenopathy, supple, symmetrical, trachea midline and thyroid normal to inspection and palpation Lungs: clear to auscultation bilaterally Breasts: normal appearance, no masses or tenderness, No nipple retraction or dimpling, No nipple discharge or bleeding, No axillary or supraclavicular adenopathy Heart: regular rate and rhythm Abdomen: soft, non-tender; no masses,  no organomegaly Extremities: extremities normal, atraumatic, no cyanosis or edema Skin: Skin color, texture, turgor normal. No rashes or lesions Lymph nodes: Cervical, supraclavicular, and axillary nodes normal. No abnormal inguinal nodes palpated Neurologic: Grossly normal   Pelvic: External genitalia:  no lesions              Urethra:  normal  appearing urethra with no masses, tenderness or lesions              Bartholin's and Skene's: normal                 Vagina: normal appearing vagina with normal color and discharge, no lesions scant discharge noted affirm taken              Cervix: normal no lesions or tenderness  Pap taken: No. Bimanual Exam:  Uterus:  enlarged, 12 weeks size difficult to palpate due to body habitus, not nodular              Adnexa: no mass, fullness, tenderness not palpable due body habitus               Rectovaginal: Confirms               Anus:  normal sphincter tone, no lesions  Chaperone present: yes  A:  Well Woman with normal exam  Menopausal no HRT  Enlarged uterus? Limited pelvic exam due to body habitus  Vaginal irritation, normal finding on exam R/O infection  Left breast biopsy scheduled due to calicfications tomorrow  Diabetes/hypertension/cholesterol management per PCP   P:   Reviewed health and wellness pertinent to exam  Aware of need to advise if vaginal bleeding  Discussed findings and need for evaluation with PUS. Patient will be called with information and scheduled.  Will treat per affirm results if needed  Will look for results on biopsy when in.  Continue with follow up with MD as indicated  Pap smear as above not taken   counseled on breast self exam, mammography screening, adequate intake of calcium and vitamin D, diet and exercise  return annually or prn  An After Visit Summary was printed and given to the patient.

## 2014-12-11 NOTE — Progress Notes (Signed)
Encounter reviewed Jill Jertson, MD   

## 2014-12-11 NOTE — Patient Instructions (Signed)

## 2014-12-12 LAB — WET PREP BY MOLECULAR PROBE
CANDIDA SPECIES: NEGATIVE
GARDNERELLA VAGINALIS: NEGATIVE
TRICHOMONAS VAG: NEGATIVE

## 2014-12-14 ENCOUNTER — Telehealth: Payer: Self-pay | Admitting: Obstetrics & Gynecology

## 2014-12-14 NOTE — Telephone Encounter (Signed)
Called patient to review benefits for procedure. Left voicemail to call back and review. °

## 2014-12-15 ENCOUNTER — Encounter: Payer: Self-pay | Admitting: *Deleted

## 2014-12-19 NOTE — Addendum Note (Signed)
Addended by: Michele Mcalpine on: 12/19/2014 04:05 PM   Modules accepted: Orders

## 2014-12-20 ENCOUNTER — Ambulatory Visit (INDEPENDENT_AMBULATORY_CARE_PROVIDER_SITE_OTHER): Payer: 59

## 2014-12-20 ENCOUNTER — Ambulatory Visit (INDEPENDENT_AMBULATORY_CARE_PROVIDER_SITE_OTHER): Payer: 59 | Admitting: Obstetrics & Gynecology

## 2014-12-20 ENCOUNTER — Encounter: Payer: Self-pay | Admitting: Obstetrics & Gynecology

## 2014-12-20 VITALS — BP 136/82 | HR 64 | Resp 20 | Wt 248.0 lb

## 2014-12-20 DIAGNOSIS — N852 Hypertrophy of uterus: Secondary | ICD-10-CM | POA: Diagnosis not present

## 2014-12-20 DIAGNOSIS — D259 Leiomyoma of uterus, unspecified: Secondary | ICD-10-CM

## 2014-12-20 DIAGNOSIS — D251 Intramural leiomyoma of uterus: Secondary | ICD-10-CM

## 2014-12-20 DIAGNOSIS — J449 Chronic obstructive pulmonary disease, unspecified: Secondary | ICD-10-CM | POA: Insufficient documentation

## 2014-12-20 HISTORY — DX: Leiomyoma of uterus, unspecified: D25.9

## 2014-12-20 NOTE — Progress Notes (Signed)
64 y.o. [id number] Divorcedfemale here for a pelvic ultrasound due to enlarged uterus noted on physical exam.  Pt has known hx of fibroids.  Denies vaginal bleeding or pelvic pain.  She feels like her uterus has actually gotten small in the last few years.  She has lost about 40 pounds in the last few years as well..    Patient's last menstrual period was 04/09/2009.  Sexually active:  no  Contraception: PMP status  FINDINGS: UTERUS:  11.4 x 8.7 x 8.7cm with 7.0cm, 1.9cm, and 3.4cm uterine fibroid.  Comparison to ultrasound in 2008 showed uterus was about 14.5cm and largest fibroid was greater than 9cm.  All fibroids are smaller and overall size of uterus is smaller EMS: 2.87mm ADNEXA:  Left ovary 2 x 1.5 x 1.2cm   Right ovary 2.2 x 1.0 x 0.9cm CUL DE SAC: no free fluid  Images with prior ultrasound reviewed.  She and I looked at these today.  Pt wishes she'd had a hysterectomy in the past but with uterus being smaller and also pt not having any symptoms, I feel continuing to monitor is appropriate.  Pt is worried about ovarian cancer.  Symptoms reviewed.  Pt advised to call if she ever feels she has new or worrisome symptoms and advised we can proceed with ultrasound again if physical exam has any change.  Pt reassured by this.  Assessment:  Uterine fibroids, decreased in size since last ultrasound 2008  Plan: Return for AEX.  Consider repeat PUS if has change in symptoms or physical exam.  ~15 minutes spent with patient >50% of time was in face to face discussion of above.

## 2014-12-25 ENCOUNTER — Telehealth: Payer: Self-pay

## 2014-12-25 NOTE — Telephone Encounter (Signed)
Patient notified of [facility] BMD results. Copy will be scanned in epic.//kn 

## 2015-01-07 ENCOUNTER — Other Ambulatory Visit: Payer: Self-pay | Admitting: Internal Medicine

## 2015-01-08 ENCOUNTER — Other Ambulatory Visit: Payer: Self-pay | Admitting: Internal Medicine

## 2015-01-14 ENCOUNTER — Other Ambulatory Visit: Payer: Self-pay | Admitting: Internal Medicine

## 2015-01-14 ENCOUNTER — Other Ambulatory Visit: Payer: Self-pay | Admitting: Physician Assistant

## 2015-01-14 NOTE — Telephone Encounter (Signed)
Rx called into [facility].

## 2015-01-27 ENCOUNTER — Encounter: Payer: Self-pay | Admitting: *Deleted

## 2015-02-21 ENCOUNTER — Encounter: Payer: Self-pay | Admitting: Physician Assistant

## 2015-02-21 ENCOUNTER — Ambulatory Visit (INDEPENDENT_AMBULATORY_CARE_PROVIDER_SITE_OTHER): Payer: 59 | Admitting: Physician Assistant

## 2015-02-21 VITALS — BP 140/80 | HR 96 | Temp 97.3°F | Resp 16 | Ht 64.75 in | Wt 248.0 lb

## 2015-02-21 DIAGNOSIS — E559 Vitamin D deficiency, unspecified: Secondary | ICD-10-CM | POA: Diagnosis not present

## 2015-02-21 DIAGNOSIS — Z79899 Other long term (current) drug therapy: Secondary | ICD-10-CM

## 2015-02-21 DIAGNOSIS — I10 Essential (primary) hypertension: Secondary | ICD-10-CM | POA: Diagnosis not present

## 2015-02-21 DIAGNOSIS — E119 Type 2 diabetes mellitus without complications: Secondary | ICD-10-CM | POA: Diagnosis not present

## 2015-02-21 DIAGNOSIS — E782 Mixed hyperlipidemia: Secondary | ICD-10-CM | POA: Diagnosis not present

## 2015-02-21 LAB — CBC WITH DIFFERENTIAL/PLATELET
BASOS PCT: 0 % (ref 0–1)
Basophils Absolute: 0 10*3/uL (ref 0.0–0.1)
Eosinophils Absolute: 0.2 10*3/uL (ref 0.0–0.7)
Eosinophils Relative: 2 % (ref 0–5)
HCT: 35.6 % — ABNORMAL LOW (ref 36.0–46.0)
HEMOGLOBIN: 11.4 g/dL — AB (ref 12.0–15.0)
Lymphocytes Relative: 26 % (ref 12–46)
Lymphs Abs: 2 10*3/uL (ref 0.7–4.0)
MCH: 26.5 pg (ref 26.0–34.0)
MCHC: 32 g/dL (ref 30.0–36.0)
MCV: 82.6 fL (ref 78.0–100.0)
MPV: 9.8 fL (ref 8.6–12.4)
Monocytes Absolute: 0.7 10*3/uL (ref 0.1–1.0)
Monocytes Relative: 9 % (ref 3–12)
NEUTROS ABS: 4.9 10*3/uL (ref 1.7–7.7)
NEUTROS PCT: 63 % (ref 43–77)
PLATELETS: 248 10*3/uL (ref 150–400)
RBC: 4.31 MIL/uL (ref 3.87–5.11)
RDW: 15.6 % — ABNORMAL HIGH (ref 11.5–15.5)
WBC: 7.8 10*3/uL (ref 4.0–10.5)

## 2015-02-21 NOTE — Patient Instructions (Signed)
We want weight loss that will last so you should lose 1-2 pounds a week.  THAT IS IT! Please pick THREE things a month to change. Once it is a habit check off the item. Then pick another three items off the list to become habits.  If you are already doing a habit on the list GREAT!  Cross that item off! o Don't drink your calories. Ie, alcohol, soda, fruit juice, and sweet tea.  o Drink more water. Drink a glass when you feel hungry or before each meal.  o Eat breakfast - Complex carb and protein (likeDannon light and fit yogurt, oatmeal, fruit, eggs, turkey bacon). o Measure your cereal.  Eat no more than one cup a day. (ie Kashi) o Eat an apple a day. o Add a vegetable a day. o Try a new vegetable a month. o Use Pam! Stop using oil or butter to cook. o Don't finish your plate or use smaller plates. o Share your dessert. o Eat sugar free Jello for dessert or frozen grapes. o Don't eat 2-3 hours before bed. o Switch to whole wheat bread, pasta, and brown rice. o Make healthier choices when you eat out. No fries! o Pick baked chicken, NOT fried. o Don't forget to SLOW DOWN when you eat. It is not going anywhere.  o Take the stairs. o Park far away in the parking lot o Lift soup cans (or weights) for 10 minutes while watching TV. o Walk at work for 10 minutes during break. o Walk outside 1 time a week with your friend, kids, dog, or significant other. o Start a walking group at church. o Walk the mall as much as you can tolerate.  o Keep a food diary. o Weigh yourself daily. o Walk for 15 minutes 3 days per week. o Cook at home more often and eat out less.  If life happens and you go back to old habits, it is okay.  Just start over. You can do it!   If you experience chest pain, get short of breath, or tired during the exercise, please stop immediately and inform your doctor.   Before you even begin to attack a weight-loss plan, it pays to remember this: You are not fat. You have fat.  Losing weight isn't about blame or shame; it's simply another achievement to accomplish. Dieting is like any other skill-you have to buckle down and work at it. As long as you act in a smart, reasonable way, you'll ultimately get where you want to be. Here are some weight loss pearls for you.  1. It's Not a Diet. It's a Lifestyle Thinking of a diet as something you're on and suffering through only for the short term doesn't work. To shed weight and keep it off, you need to make permanent changes to the way you eat. It's OK to indulge occasionally, of course, but if you cut calories temporarily and then revert to your old way of eating, you'll gain back the weight quicker than you can say yo-yo. Use it to lose it. Research shows that one of the best predictors of long-term weight loss is how many pounds you drop in the first month. For that reason, nutritionists often suggest being stricter for the first two weeks of your new eating strategy to build momentum. Cut out added sugar and alcohol and avoid unrefined carbs. After that, figure out how you can reincorporate them in a way that's healthy and maintainable.  2. There's a Right   Way to Exercise Working out burns calories and fat and boosts your metabolism by building muscle. But those trying to lose weight are notorious for overestimating the number of calories they burn and underestimating the amount they take in. Unfortunately, your system is biologically programmed to hold on to extra pounds and that means when you start exercising, your body senses the deficit and ramps up its hunger signals. If you're not diligent, you'll eat everything you burn and then some. Use it to lose it. Cardio gets all the exercise glory, but strength and interval training are the real heroes. They help you build lean muscle, which in turn increases your metabolism and calorie-burning ability 3. Don't Overreact to Mild Hunger Some people have a hard time losing weight because  of hunger anxiety. To them, being hungry is bad-something to be avoided at all costs-so they carry snacks with them and eat when they don't need to. Others eat because they're stressed out or bored. While you never want to get to the point of being ravenous (that's when bingeing is likely to happen), a hunger pang, a craving, or the fact that it's 3:00 p.m. should not send you racing for the vending machine or obsessing about the energy bar in your purse. Ideally, you should put off eating until your stomach is growling and it's difficult to concentrate.  Use it to lose it. When you feel the urge to eat, use the HALT method. Ask yourself, Am I really hungry? Or am I angry or anxious, lonely or bored, or tired? If you're still not certain, try the apple test. If you're truly hungry, an apple should seem delicious; if it doesn't, something else is going on. Or you can try drinking water and making yourself busy, if you are still hungry try a healthy snack.  4. Not All Calories Are Created Equal The mechanics of weight loss are pretty simple: Take in fewer calories than you use for energy. But the kind of food you eat makes all the difference. Processed food that's high in saturated fat and refined starch or sugar can cause inflammation that disrupts the hormone signals that tell your brain you're full. The result: You eat a lot more.  Use it to lose it. Clean up your diet. Swap in whole, unprocessed foods, including vegetables, lean protein, and healthy fats that will fill you up and give you the biggest nutritional bang for your calorie buck. In a few weeks, as your brain starts receiving regular hunger and fullness signals once again, you'll notice that you feel less hungry overall and naturally start cutting back on the amount you eat.  5. Protein, Produce, and Plant-Based Fats Are Your Weight-Loss Trinity Here's why eating the three Ps regularly will help you drop pounds. Protein fills you up. You need it  to build lean muscle, which keeps your metabolism humming so that you can torch more fat. People in a weight-loss program who ate double the recommended daily allowance for protein (about 110 grams for a 150-pound woman) lost 70 percent of their weight from fat, while people who ate the RDA lost only about 40 percent, one study found. Produce is packed with filling fiber. "It's very difficult to consume too many calories if you're eating a lot of vegetables. Example: Three cups of broccoli is a lot of food, yet only 93 calories. (Fruit is another story. It can be easy to overeat and can contain a lot of calories from sugar, so be sure to   monitor your intake.) Plant-based fats like olive oil and those in avocados and nuts are healthy and extra satiating.  Use it to lose it. Aim to incorporate each of the three Ps into every meal and snack. People who eat protein throughout the day are able to keep weight off, according to a study in the American Journal of Clinical Nutrition. In addition to meat, poultry and seafood, good sources are beans, lentils, eggs, tofu, and yogurt. As for fat, keep portion sizes in check by measuring out salad dressing, oil, and nut butters (shoot for one to two tablespoons). Finally, eat veggies or a little fruit at every meal. People who did that consumed 308 fewer calories but didn't feel any hungrier than when they didn't eat more produce.  7. How You Eat Is As Important As What You Eat In order for your brain to register that you're full, you need to focus on what you're eating. Sit down whenever you eat, preferably at a table. Turn off the TV or computer, put down your phone, and look at your food. Smell it. Chew slowly, and don't put another bite on your fork until you swallow. When women ate lunch this attentively, they consumed 30 percent less when snacking later than those who listened to an audiobook at lunchtime, according to a study in the [employer] Journal of Nutrition. 8.  Weighing Yourself Really Works The scale provides the best evidence about whether your efforts are paying off. Seeing the numbers tick up or down or stagnate is motivation to keep going-or to rethink your approach. A 2015 study at [employer] found that daily weigh-ins helped people lose more weight, keep it off, and maintain that loss, even after two years. Use it to lose it. Step on the scale at the same time every day for the best results. If your weight shoots up several pounds from one weigh-in to the next, don't freak out. Eating a lot of salt the night before or having your period is the likely culprit. The number should return to normal in a day or two. It's a steady climb that you need to do something about. 9. Too Much Stress and Too Little Sleep Are Your Enemies When you're tired and frazzled, your body cranks up the production of cortisol, the stress hormone that can cause carb cravings. Not getting enough sleep also boosts your levels of ghrelin, a hormone associated with hunger, while suppressing leptin, a hormone that signals fullness and satiety. People on a diet who slept only five and a half hours a night for two weeks lost 55 percent less fat and were hungrier than those who slept eight and a half hours, according to a study in the [employer] Journal. Use it to lose it. Prioritize sleep, aiming for seven hours or more a night, which research shows helps lower stress. And make sure you're getting quality zzz's. If a snoring spouse or a fidgety cat wakes you up frequently throughout the night, you may end up getting the equivalent of just four hours of sleep, according to a study from [employer]. Keep pets out of the bedroom, and use a white-noise app to drown out snoring. 10. You Will Hit a plateau-And You Can Bust Through It As you slim down, your body releases much less leptin, the fullness hormone.  If you're not strength training, start right now.  Building muscle can raise your metabolism to help you overcome a plateau. To keep your body challenged   and burning calories, incorporate new moves and more intense intervals into your workouts or add another sweat session to your weekly routine. Alternatively, cut an extra 100 calories or so a day from your diet. Now that you've lost weight, your body simply doesn't need as much fuel.   Ways to cut 100 calories  1. Eat your eggs with hot sauce OR salsa instead of cheese.  Eggs are great for breakfast, but many people consider eggs and cheese to be BFFs. Instead of cheese-1 oz. of cheddar has 114 calories-top your eggs with hot sauce, which contains no calories and helps with satiety and metabolism. Salsa is also a great option!!  2. Top your toast, waffles or pancakes with mashed berries instead of jelly or syrup. Half a cup of berries-fresh, frozen or thawed-has about 40 calories, compared with 2 tbsp. of maple syrup or jelly, which both have about 100 calories. The berries will also give you a good punch of fiber, which helps keep you full and satisfied and won't spike blood sugar quickly like the jelly or syrup. 3. Swap the non-fat latte for black coffee with a splash of half-and-half. Contrary to its name, that non-fat latte has 130 calories and a startling 19g of carbohydrates per 16 oz. serving. Replacing that 'light' drinkable dessert with a black coffee with a splash of half-and-half saves you more than 100 calories per 16 oz. serving. 4. Sprinkle salads with freeze-dried raspberries instead of dried cranberries. If you want a sweet addition to your nutritious salad, stay away from dried cranberries. They have a whopping 130 calories per  cup and 30g carbohydrates. Instead, sprinkle freeze-dried raspberries guilt-free and save more than 100 calories per  cup serving, adding 3g of belly-filling fiber. 5. Go for mustard in place of mayo on your sandwich. Mustard can add really nice flavor to any  sandwich, and there are tons of varieties, from spicy to honey. A serving of mayo is 95 calories, versus 10 calories in a serving of mustard. 6. Choose a DIY salad dressing instead of the store-bought kind. Mix Dijon or whole grain mustard with low-fat Kefir or red wine vinegar and garlic. 7. Use hummus as a spread instead of a dip. Use hummus as a spread on a high-fiber cracker or tortilla with a sandwich and save on calories without sacrificing taste. 8. Pick just one salad "accessory." Salad isn't automatically a calorie winner. It's easy to over-accessorize with toppings. Instead of topping your salad with nuts, avocado and cranberries (all three will clock in at 313 calories), just pick one. The next day, choose a different accessory, which will also keep your salad interesting. You don't wear all your jewelry every day, right? 9. Ditch the white pasta in favor of spaghetti squash. One cup of cooked spaghetti squash has about 40 calories, compared with traditional spaghetti, which comes with more than 200. Spaghetti squash is also nutrient-dense. It's a good source of fiber and Vitamins A and C, and it can be eaten just like you would eat pasta-with a great tomato sauce and turkey meatballs or with pesto, tofu and spinach, for example. 10. Dress up your chili, soups and stews with non-fat Greek yogurt instead of sour cream. Just a 'dollop' of sour cream can set you back 115 calories and a whopping 12g of fat-seven of which are of the artery-clogging variety. Added bonus: Greek yogurt is packed with muscle-building protein, calcium and B Vitamins. 11. Mash cauliflower instead of mashed potatoes. One cup of   traditional mashed potatoes-in all their creamy goodness-has more than 200 calories, compared to mashed cauliflower, which you can typically eat for less than 100 calories per 1 cup serving. Cauliflower is a great source of the antioxidant indole-3-carbinol (I3C), which may help reduce the risk of  some cancers, like breast cancer. 12. Ditch the ice cream sundae in favor of a Greek yogurt parfait. Instead of a cup of ice cream or fro-yo for dessert, try 1 cup of nonfat Greek yogurt topped with fresh berries and a sprinkle of cacao nibs. Both toppings are packed with antioxidants, which can help reduce cellular inflammation and oxidative damage. And the comparison is a no-brainer: One cup of ice cream has about 275 calories; one cup of frozen yogurt has about 230; and a cup of Greek yogurt has just 130, plus twice the protein, so you're less likely to return to the freezer for a second helping. 13. Put olive oil in a spray container instead of using it directly from the bottle. Each tablespoon of olive oil is 120 calories and 15g of fat. Use a mister instead of pouring it straight into the pan or onto a salad. This allows for portion control and will save you more than 100 calories. 14. When baking, substitute canned pumpkin for butter or oil. Canned pumpkin-not pumpkin pie mix-is loaded with Vitamin A, which is important for skin and eye health, as well as immunity. And the comparisons are pretty crazy:  cup of canned pumpkin has about 40 calories, compared to butter or oil, which has more than 800 calories. Yes, 800 calories. Applesauce and mashed banana can also serve as good substitutions for butter or oil, usually in a 1:1 ratio. 15. Top casseroles with high-fiber cereal instead of breadcrumbs. Breadcrumbs are typically made with white bread, while breakfast cereals contain 5-9g of fiber per serving. Not only will you save more than 150 calories per  cup serving, the swap will also keep you more full and you'll get a metabolism boost from the added fiber. 16. Snack on pistachios instead of macadamia nuts. Believe it or not, you get the same amount of calories from 35 pistachios (100 calories) as you would from only five macadamia nuts. 17. Chow down on kale chips rather than potato  chips. This is my favorite 'don't knock it 'till you try it' swap. Kale chips are so easy to make at home, and you can spice them up with a little grated parmesan or chili powder. Plus, they're a mere fraction of the calories of potato chips, but with the same crunch factor we crave so often. 18. Add seltzer and some fruit slices to your cocktail instead of soda or fruit juice. One cup of soda or fruit juice can pack on as much as 140 calories. Instead, use seltzer and fruit slices. The fruit provides valuable phytochemicals, such as flavonoids and anthocyanins, which help to combat cancer and stave off the aging process.  

## 2015-02-21 NOTE — Progress Notes (Signed)
Assessment and Plan:  Hypertension: Continue medication, monitor blood pressure at home. Continue DASH diet.  Reminder to go to the ER if any CP, SOB, nausea, dizziness, severe HA, changes vision/speech, left arm numbness and tingling and jaw pain. Cholesterol: Continue diet and exercise. Check cholesterol.  Diabetes-Continue diet and exercise. Check A1C Vitamin D Def- check level and continue medications.  Obesity with co morbidities- long discussion about weight loss, diet, and exercise Smoking cessation- discussed with patient, understands risk of MI, stroke, cancer, and death.   Continue diet and meds as discussed. Further disposition pending results of labs. Discussed med's effects and SE's.    HPI 64 y.o. female  presents for 3 month follow up with hypertension, hyperlipidemia, diabetes and vitamin D. Her blood pressure has been controlled at home, today their BP is BP: 140/80 mmHg She does not workout. She denies chest pain, shortness of breath, dizziness.  She is on cholesterol medication and denies myalgias. Her cholesterol is at goal. The cholesterol last visit was:   Lab Results  Component Value Date   CHOL 159 11/19/2014   HDL 54 11/19/2014   LDLCALC 86 11/19/2014   TRIG 95 11/19/2014   CHOLHDL 2.9 11/19/2014   She has been working on diet and exercise for Diabetes, she is on bASA, ARB, she is on phentermine, metformin and has done a good job with weight loss and bringing down her sugars, and denies polydipsia and polyuria. Last A1C in the office was:  Lab Results  Component Value Date   HGBA1C 6.2* 11/19/2014   Patient is on Vitamin D supplement. Lab Results  Component Value Date   VD25OH 57 11/19/2014   She is smoking still but she has been cutting back.  BMI is Body mass index is 41.57 kg/(m^2)., she is working on diet and exercise. Is back on phentermine, started 3 months ago.  Wt Readings from Last 3 Encounters:  02/21/15 248 lb (112.492 kg)  12/20/14 248 lb  (112.492 kg)  12/11/14 249 lb (112.946 kg)    Current Medications:  Current Outpatient Prescriptions on File Prior to Visit  Medication Sig Dispense Refill  . ACCU-CHEK AVIVA PLUS test strip USE AS DIRECTED 300 each 6  . albuterol (PROVENTIL HFA;VENTOLIN HFA) 108 (90 BASE) MCG/ACT inhaler Inhale 2 puffs into the lungs every 6 (six) hours as needed for wheezing.    Marland Kitchen ALPRAZolam (XANAX) 0.25 MG tablet TAKE 1 TABLET BY MOUTH TWICE DAILY 60 tablet 1  . aspirin 81 MG tablet Take 81 mg by mouth daily.    Marland Kitchen BENICAR HCT 40-12.5 MG tablet TAKE 1 TABLET BY MOUTH ONCE DAILY 90 tablet PRN  . Cyanocobalamin (VITAMIN B-12 PO) Take 100 mg by mouth daily.     . cyclobenzaprine (FLEXERIL) 10 MG tablet Take 1/2 to 1 tablet by  mouth 3 times daily for  muscle spasms 270 tablet 2  . dextromethorphan-guaiFENesin (MUCINEX DM) 30-600 MG per 12 hr tablet Take 1 tablet by mouth every 12 (twelve) hours.    . fexofenadine (ALLEGRA) 180 MG tablet Take 180 mg by mouth daily.    . fluticasone (FLONASE) 50 MCG/ACT nasal spray Place 2 sprays into the nose daily.    . hyoscyamine (LEVBID) 0.375 MG 12 hr tablet TAKE 1 TABLET BY MOUTH TWICE DAILY (Patient taking differently: daily) 180 tablet 98  . ivermectin (STROMECTOL) 3 MG TABS tablet Take all tabs at once on an empty stomach 10 tablet 0  . MELATONIN PO Take 5 mg by mouth  at bedtime.     . meloxicam (MOBIC) 15 MG tablet TAKE 1 TABLET BY MOUTH ONCE DAILY, AFTER A MEAL AS NEEDED FOR PAIN AND INFLAMMATION 90 tablet 0  . metFORMIN (GLUCOPHAGE-XR) 500 MG 24 hr tablet TAKE 1 TABLET BY MOUTH AT BREAKFAST AND LUNCH AND 2 BY MOUTH AT SUPPER 360 tablet 0  . metoCLOPramide (REGLAN) 10 MG tablet TAKE 1 TABLET BY MOUTH FOUR TIMES DAILY BEFORE A MEAL AND EVERY NIGHT AT BEDTIME FOR ACID REFLUX 360 tablet 99  . montelukast (SINGULAIR) 10 MG tablet TAKE 1 TABLET BY MOUTH EVERY DAY 90 tablet 3  . nystatin cream (MYCOSTATIN) Apply 1 application topically 2 (two) times daily. Apply to  affected area BID for up to 7 days. 30 g 0  . OVER THE COUNTER MEDICATION Iron 325 mg daily    . OVER THE COUNTER MEDICATION Vitamin C with cranberry 1 daily.    . pantoprazole (PROTONIX) 40 MG tablet Take 1 tablet by mouth  daily for acid indigestion 90 tablet 4  . phentermine (ADIPEX-P) 37.5 MG tablet TK 1/2 TO  1 T PO QAM FOR DIET  5  . Probiotic Product (PROBIOTIC PO) Take by mouth daily.    . simvastatin (ZOCOR) 40 MG tablet Take 1 tablet by mouth  every evening 90 tablet 4  . venlafaxine XR (EFFEXOR-XR) 150 MG 24 hr capsule TK ONE C PO BID  4  . Vitamin D, Ergocalciferol, (DRISDOL) 50000 UNITS CAPS capsule Take 1 capsule daily or as directed 40 capsule 99  . zaleplon (SONATA) 10 MG capsule Take 10 mg by mouth at bedtime as needed.     No current facility-administered medications on file prior to visit.   Medical History:  Past Medical History  Diagnosis Date  . IBS (irritable bowel syndrome) 12/89  . Bell's palsy 01/1985  . Obesity, Class III, BMI 40-49.9 (morbid obesity) ([place])   . Anxiety   . Vitamin D deficiency   . OSA (obstructive sleep apnea)    Allergies:  Allergies  Allergen Reactions  . Penicillins Rash and Anaphylaxis  . Sulfa Antibiotics Shortness Of Breath    Also light headed, rash  . Celebrex [Celecoxib]     Elevates Blood Pressure   . Ciprofloxacin Nausea And Vomiting  . Codeine Nausea And Vomiting    And hydrocodone  . Levaquin [Levofloxacin] Nausea And Vomiting  . Motrin Ib [Ibuprofen] Hives  . Prempro [Conj Estrog-Medroxyprogest Ace] Hives    Review of Systems  Constitutional: Negative.  Negative for fever, chills and diaphoresis.  HENT: Negative.   Respiratory: Negative.  Negative for cough and shortness of breath.   Cardiovascular: Negative.   Gastrointestinal: Negative.  Negative for nausea, vomiting, abdominal pain, diarrhea, constipation and blood in stool.  Genitourinary: Negative.  Negative for dysuria, urgency, frequency and hematuria.   Musculoskeletal: Positive for myalgias and back pain.  Neurological: Negative for dizziness.  Psychiatric/Behavioral: Negative.      Family history- Review and unchanged Social history- Review and unchanged Physical Exam: BP 140/80 mmHg  Pulse 96  Temp(Src) 97.3 F (36.3 C) (Temporal)  Resp 16  Ht 5' 4.75" (1.645 m)  Wt 248 lb (112.492 kg)  BMI 41.57 kg/m2  SpO2 99%  LMP 04/09/2009 Wt Readings from Last 3 Encounters:  02/21/15 248 lb (112.492 kg)  12/20/14 248 lb (112.492 kg)  12/11/14 249 lb (112.946 kg)   General Appearance: Well nourished, in no apparent distress. Eyes: PERRLA, EOMs, conjunctiva no swelling or erythema Sinuses: + Frontal/maxillary  tenderness ENT/Mouth: Ext aud canals clear, TMs without erythema, bulging. No erythema, swelling, or exudate on post pharynx.  Tonsils not swollen or erythematous. Hearing normal.  Neck: Supple, thyroid normal.  Respiratory: Respiratory effort normal, diffuse decreased breath sounds without rales, wheezing, rhonchi,  or stridor.  Cardio: RRR with no MRGs. Brisk peripheral pulses with mild bilateral edema.  Abdomen: Soft, + BS, obese  Non tender, no guarding, rebound, hernias, masses. Lymphatics: Non tender without lymphadenopathy.  Musculoskeletal: Full ROM, 5/5 strength, antalgic gait walking with cane Skin: Warm, dry without rashes, lesions, ecchymosis.  Neuro: Cranial nerves intact. No cerebellar symptoms. Sensation intact.  Psych: Awake and oriented X 3, normal affect, Insight and Judgment appropriate.    Vicie Mutters, PA-C 2:42 PM [facility]

## 2015-02-22 LAB — LIPID PANEL
Cholesterol: 153 mg/dL (ref 125–200)
HDL: 63 mg/dL (ref >=46)
LDL CALC: 75 mg/dL (ref <130)
Total CHOL/HDL Ratio: 2.4 Ratio (ref <=5.0)
Triglycerides: 75 mg/dL (ref <150)
VLDL: 15 mg/dL (ref <30)

## 2015-02-22 LAB — HEPATIC FUNCTION PANEL
ALT: 17 U/L (ref 6–29)
AST: 22 U/L (ref 10–35)
Albumin: 3.7 g/dL (ref 3.6–5.1)
Alkaline Phosphatase: 113 U/L (ref 33–130)
BILIRUBIN DIRECT: 0.1 mg/dL (ref <=0.2)
BILIRUBIN INDIRECT: 0.2 mg/dL (ref 0.2–1.2)
BILIRUBIN TOTAL: 0.3 mg/dL (ref 0.2–1.2)
TOTAL PROTEIN: 6.3 g/dL (ref 6.1–8.1)

## 2015-02-22 LAB — BASIC METABOLIC PANEL WITH GFR
BUN: 16 mg/dL (ref 7–25)
CALCIUM: 9.5 mg/dL (ref 8.6–10.4)
CHLORIDE: 99 mmol/L (ref 98–110)
CO2: 27 mmol/L (ref 20–31)
Creat: 0.75 mg/dL (ref 0.50–0.99)
GFR, EST NON AFRICAN AMERICAN: 86 mL/min (ref >=60)
GFR, Est African American: >89 mL/min (ref >=60)
Glucose, Bld: 103 mg/dL — ABNORMAL HIGH (ref 65–99)
Potassium: 4.4 mmol/L (ref 3.5–5.3)
Sodium: 136 mmol/L (ref 135–146)

## 2015-02-22 LAB — MAGNESIUM: Magnesium: 1.7 mg/dL (ref 1.5–2.5)

## 2015-02-22 LAB — HEMOGLOBIN A1C
HEMOGLOBIN A1C: 5.6 % (ref <5.7)
Mean Plasma Glucose: 114 mg/dL (ref <117)

## 2015-02-22 LAB — INSULIN, FASTING: INSULIN FASTING, SERUM: 17.6 u[IU]/mL (ref 2.0–19.6)

## 2015-02-22 LAB — TSH: TSH: 2.464 u[IU]/mL (ref 0.350–4.500)

## 2015-02-22 LAB — VITAMIN D 25 HYDROXY (VIT D DEFICIENCY, FRACTURES)

## 2015-03-28 ENCOUNTER — Other Ambulatory Visit: Payer: Self-pay | Admitting: Physician Assistant

## 2015-03-28 NOTE — Telephone Encounter (Signed)
Rx called into [facility].

## 2015-04-03 ENCOUNTER — Other Ambulatory Visit: Payer: Self-pay | Admitting: Internal Medicine

## 2015-05-15 ENCOUNTER — Encounter: Payer: Self-pay | Admitting: Internal Medicine

## 2015-05-21 ENCOUNTER — Encounter: Payer: Self-pay | Admitting: Internal Medicine

## 2015-05-21 ENCOUNTER — Ambulatory Visit (INDEPENDENT_AMBULATORY_CARE_PROVIDER_SITE_OTHER): Payer: Self-pay | Admitting: Internal Medicine

## 2015-05-21 ENCOUNTER — Other Ambulatory Visit: Payer: Self-pay | Admitting: Internal Medicine

## 2015-05-21 VITALS — BP 130/94 | HR 84 | Temp 97.8°F | Resp 16 | Ht 65.0 in | Wt 256.6 lb

## 2015-05-21 DIAGNOSIS — Z0001 Encounter for general adult medical examination with abnormal findings: Secondary | ICD-10-CM

## 2015-05-21 DIAGNOSIS — K589 Irritable bowel syndrome without diarrhea: Secondary | ICD-10-CM

## 2015-05-21 DIAGNOSIS — Z111 Encounter for screening for respiratory tuberculosis: Secondary | ICD-10-CM

## 2015-05-21 DIAGNOSIS — I10 Essential (primary) hypertension: Secondary | ICD-10-CM | POA: Diagnosis not present

## 2015-05-21 DIAGNOSIS — E782 Mixed hyperlipidemia: Secondary | ICD-10-CM

## 2015-05-21 DIAGNOSIS — K219 Gastro-esophageal reflux disease without esophagitis: Secondary | ICD-10-CM

## 2015-05-21 DIAGNOSIS — Z23 Encounter for immunization: Secondary | ICD-10-CM

## 2015-05-21 DIAGNOSIS — Z79899 Other long term (current) drug therapy: Secondary | ICD-10-CM

## 2015-05-21 DIAGNOSIS — E559 Vitamin D deficiency, unspecified: Secondary | ICD-10-CM

## 2015-05-21 DIAGNOSIS — E66813 Obesity, class 3: Secondary | ICD-10-CM

## 2015-05-21 DIAGNOSIS — Z0000 Encounter for general adult medical examination without abnormal findings: Secondary | ICD-10-CM

## 2015-05-21 DIAGNOSIS — E1122 Type 2 diabetes mellitus with diabetic chronic kidney disease: Secondary | ICD-10-CM

## 2015-05-21 DIAGNOSIS — Z1212 Encounter for screening for malignant neoplasm of rectum: Secondary | ICD-10-CM

## 2015-05-21 DIAGNOSIS — R5383 Other fatigue: Secondary | ICD-10-CM

## 2015-05-21 LAB — IRON AND TIBC
%SAT: 9 % — ABNORMAL LOW (ref 11–50)
Iron: 33 ug/dL — ABNORMAL LOW (ref 45–160)
TIBC: 352 ug/dL (ref 250–450)
UIBC: 319 ug/dL (ref 125–400)

## 2015-05-21 LAB — CBC WITH DIFFERENTIAL/PLATELET
Basophils Absolute: 0 10*3/uL (ref 0.0–0.1)
Basophils Relative: 0 % (ref 0–1)
Eosinophils Absolute: 0.2 10*3/uL (ref 0.0–0.7)
Eosinophils Relative: 3 % (ref 0–5)
HEMATOCRIT: 36.9 % (ref 36.0–46.0)
HEMOGLOBIN: 11.7 g/dL — AB (ref 12.0–15.0)
LYMPHS ABS: 1.9 10*3/uL (ref 0.7–4.0)
LYMPHS PCT: 27 % (ref 12–46)
MCH: 26.2 pg (ref 26.0–34.0)
MCHC: 31.7 g/dL (ref 30.0–36.0)
MCV: 82.7 fL (ref 78.0–100.0)
MONOS PCT: 8 % (ref 3–12)
MPV: 9.7 fL (ref 8.6–12.4)
Monocytes Absolute: 0.6 10*3/uL (ref 0.1–1.0)
NEUTROS ABS: 4.5 10*3/uL (ref 1.7–7.7)
NEUTROS PCT: 62 % (ref 43–77)
Platelets: 239 10*3/uL (ref 150–400)
RBC: 4.46 MIL/uL (ref 3.87–5.11)
RDW: 16.3 % — ABNORMAL HIGH (ref 11.5–15.5)
WBC: 7.2 10*3/uL (ref 4.0–10.5)

## 2015-05-21 LAB — BASIC METABOLIC PANEL WITH GFR
BUN: 17 mg/dL (ref 7–25)
CHLORIDE: 102 mmol/L (ref 98–110)
CO2: 29 mmol/L (ref 20–31)
CREATININE: 0.88 mg/dL (ref 0.50–0.99)
Calcium: 9.6 mg/dL (ref 8.6–10.4)
GFR, Est African American: 81 mL/min (ref >=60)
GFR, Est Non African American: 71 mL/min (ref >=60)
Glucose, Bld: 107 mg/dL — ABNORMAL HIGH (ref 65–99)
POTASSIUM: 4.7 mmol/L (ref 3.5–5.3)
Sodium: 140 mmol/L (ref 135–146)

## 2015-05-21 LAB — LIPID PANEL
CHOL/HDL RATIO: 2.5 ratio (ref <=5.0)
Cholesterol: 150 mg/dL (ref 125–200)
HDL: 61 mg/dL (ref >=46)
LDL CALC: 72 mg/dL (ref <130)
Triglycerides: 83 mg/dL (ref <150)
VLDL: 17 mg/dL (ref <30)

## 2015-05-21 LAB — TSH: TSH: 2.08 m[IU]/L

## 2015-05-21 LAB — HEPATIC FUNCTION PANEL
ALBUMIN: 3.7 g/dL (ref 3.6–5.1)
ALK PHOS: 132 U/L — AB (ref 33–130)
ALT: 16 U/L (ref 6–29)
AST: 18 U/L (ref 10–35)
Bilirubin, Direct: 0.1 mg/dL (ref <=0.2)
Indirect Bilirubin: 0.2 mg/dL (ref 0.2–1.2)
Total Bilirubin: 0.3 mg/dL (ref 0.2–1.2)
Total Protein: 6.5 g/dL (ref 6.1–8.1)

## 2015-05-21 LAB — VITAMIN B12: Vitamin B-12: 447 pg/mL (ref 200–1100)

## 2015-05-21 LAB — MAGNESIUM: MAGNESIUM: 1.9 mg/dL (ref 1.5–2.5)

## 2015-05-21 NOTE — Patient Instructions (Signed)
Recommend Adult Low Dose Aspirin or   coated  Aspirin 81 mg daily   To reduce risk of Colon Cancer 20 %,   Skin Cancer 26 % ,   Melanoma 46%   and   Pancreatic cancer 60%   ++++++++++++++++++++++++++++++++++++++++++++++++++++++ Vitamin D goal   is between 70-100.   Please make sure that you are taking your Vitamin D as directed.   It is very important as a natural anti-inflammatory   helping hair, skin, and nails, as well as reducing stroke and heart attack risk.   It helps your bones and helps with mood.  It also decreases numerous cancer risks so please take it as directed.   Low Vit D is associated with a 200-300% higher risk for CANCER   and 200-300% higher risk for HEART   ATTACK  &  STROKE.   .....................................Marland Kitchen  It is also associated with higher death rate at younger ages,   autoimmune diseases like Rheumatoid arthritis, Lupus, Multiple Sclerosis.     Also many other serious conditions, like depression, Alzheimer's  Dementia, infertility, muscle aches, fatigue, fibromyalgia - just to name a few.  ++++++++++++++++++++++++++++++++++++++++++++++++  Recommend the book "The END of DIETING" by Dr Excell Seltzer   & the book "The END of DIABETES " by Dr Excell Seltzer  At [email or web address] - get book & Audio CD's     Being diabetic has a  300% increased risk for heart attack, stroke, cancer, and alzheimer- type vascular dementia. It is very important that you work harder with diet by avoiding all foods that are white. Avoid white rice (brown & wild rice is OK), white potatoes (sweetpotatoes in moderation is OK), White bread or wheat bread or anything made out of white flour like bagels, donuts, rolls, buns, biscuits, cakes, pastries, cookies, pizza crust, and pasta (made from white flour & egg whites) - vegetarian pasta or spinach or wheat pasta is OK. Multigrain breads like Arnold's or Pepper[employer], or multigrain sandwich thins or flatbreads.  Diet,  exercise and weight loss can reverse and cure diabetes in the early stages.  Diet, exercise and weight loss is very important in the control and prevention of complications of diabetes which affects every system in your body, ie. Brain - dementia/stroke, eyes - glaucoma/blindness, heart - heart attack/heart failure, kidneys - dialysis, stomach - gastric paralysis, intestines - malabsorption, nerves - severe painful neuritis, circulation - gangrene & loss of a leg(s), and finally cancer and Alzheimers.    I recommend avoid fried & greasy foods,  sweets/candy, white rice (brown or wild rice or Quinoa is OK), white potatoes (sweet potatoes are OK) - anything made from white flour - bagels, doughnuts, rolls, buns, biscuits,white and wheat breads, pizza crust and traditional pasta made of white flour & egg white(vegetarian pasta or spinach or wheat pasta is OK).  Multi-grain bread is OK - like multi-grain flat bread or sandwich thins. Avoid alcohol in excess. Exercise is also important.    Eat all the vegetables you want - avoid meat, especially red meat and dairy - especially cheese.  Cheese is the most concentrated form of trans-fats which is the worst thing to clog up our arteries. Veggie cheese is OK which can be found in the fresh produce section at [employer] or [employer] or [employer]  ++++++++++++++++++++++++++++++++++++++++++++++++++ DASH Eating Plan  DASH stands for "Dietary Approaches to Stop Hypertension."   The DASH eating plan is a healthy eating plan that has been shown to reduce high blood  pressure (hypertension). Additional health benefits may include reducing the risk of type 2 diabetes mellitus, heart disease, and stroke. The DASH eating plan may also help with weight loss.  WHAT DO I NEED TO KNOW ABOUT THE DASH EATING PLAN?  For the DASH eating plan, you will follow these general guidelines:  Choose foods with a percent daily value for sodium of less than 5% (as listed on the food  label).  Use salt-free seasonings or herbs instead of table salt or sea salt.  Check with your health care provider or pharmacist before using salt substitutes.  Eat lower-sodium products, often labeled as "lower sodium" or "no salt added."  Eat fresh foods.  Eat more vegetables, fruits, and low-fat dairy products.    Choose whole grains. Look for the word "whole" as the first word in the ingredient list.  Choose fish   Limit sweets, desserts, sugars, and sugary drinks.  Choose heart-healthy fats.  Eat veggie cheese   Eat more home-cooked food and less restaurant, buffet, and fast food.  Limit fried foods.  [place]uffaker foods using methods other than frying.  Limit canned vegetables. If you do use them, rinse them well to decrease the sodium.  When eating at a restaurant, ask that your food be prepared with less salt, or no salt if possible.                      WHAT FOODS CAN I EAT?  Read Dr Fara Olden Fuhrman's books on The End of Dieting & The End of Diabetes  Grains  Whole grain or whole wheat bread. Brown rice. Whole grain or whole wheat pasta. Quinoa, bulgur, and whole grain cereals. Low-sodium cereals. Corn or whole wheat flour tortillas. Whole grain cornbread. Whole grain crackers. Low-sodium crackers.  Vegetables  Fresh or frozen vegetables (raw, steamed, roasted, or grilled). Low-sodium or reduced-sodium tomato and vegetable juices. Low-sodium or reduced-sodium tomato sauce and paste. Low-sodium or reduced-sodium canned vegetables.   Fruits  All fresh, canned (in natural juice), or frozen fruits.  Protein Products   All fish and seafood.  Dried beans, peas, or lentils. Unsalted nuts and seeds. Unsalted canned beans.  Dairy  Low-fat dairy products, such as skim or 1% milk, 2% or reduced-fat cheeses, low-fat ricotta or cottage cheese, or plain low-fat yogurt. Low-sodium or reduced-sodium cheeses.  Fats and Oils  Tub margarines without trans fats. Light or  reduced-fat mayonnaise and salad dressings (reduced sodium). Avocado. Safflower, olive, or canola oils. Natural peanut or almond butter.  Other  Unsalted popcorn and pretzels. The items listed above may not be a complete list of recommended foods or beverages. Contact your dietitian for more options.  +++++++++++++++++++++++++++++++++++++++++++  WHAT FOODS ARE NOT RECOMMENDED?  Grains/ White flour or wheat flour  White bread. White pasta. White rice. Refined cornbread. Bagels and croissants. Crackers that contain trans fat.  Vegetables  Creamed or fried vegetables. Vegetables in a . Regular canned vegetables. Regular canned tomato sauce and paste. Regular tomato and vegetable juices.  Fruits  Dried fruits. Canned fruit in light or heavy syrup. Fruit juice.  Meat and Other Protein Products  Meat in general - RED mwaet & White meat.  Fatty cuts of meat. Ribs, chicken wings, bacon, sausage, bologna, salami, chitterlings, fatback, hot dogs, bratwurst, and packaged luncheon meats.  Dairy  Whole or 2% milk, cream, half-and-half, and cream cheese. Whole-fat or sweetened yogurt. Full-fat cheeses or blue cheese. Nondairy creamers and whipped toppings. Processed cheese, cheese spreads, or  cheese curds.  Condiments  Onion and garlic salt, seasoned salt, table salt, and sea salt. Canned and packaged gravies. Worcestershire sauce. Tartar sauce. Barbecue sauce. Teriyaki sauce. Soy sauce, including reduced sodium. Steak sauce. Fish sauce. Oyster sauce. Cocktail sauce. Horseradish. Ketchup and mustard. Meat flavorings and tenderizers. Bouillon cubes. Hot sauce. Tabasco sauce. Marinades. Taco seasonings. Relishes.  Fats and Oils Butter, stick margarine, lard, shortening and bacon fat. Coconut, palm kernel, or palm oils. Regular salad dressings.  Pickles and olives. Salted popcorn and pretzels.  The items listed above may not be a complete list of foods and beverages to avoid.   Preventive  Care for Adults  A healthy lifestyle and preventive care can promote health and wellness. Preventive health guidelines for women include the following key practices.  A routine yearly physical is a good way to check with your health care provider about your health and preventive screening. It is a chance to share any concerns and updates on your health and to receive a thorough exam.  Visit your dentist for a routine exam and preventive care every 6 months. Brush your teeth twice a day and floss once a day. Good oral hygiene prevents tooth decay and gum disease.  The frequency of eye exams is based on your age, health, family medical history, use of contact lenses, and other factors. Follow your health care provider's recommendations for frequency of eye exams.  Eat a healthy diet. Foods like vegetables, fruits, whole grains, low-fat dairy products, and lean protein foods contain the nutrients you need without too many calories. Decrease your intake of foods high in solid fats, added sugars, and salt. Eat the right amount of calories for you.Get information about a proper diet from your health care provider, if necessary.  Regular physical exercise is one of the most important things you can do for your health. Most adults should get at least 150 minutes of moderate-intensity exercise (any activity that increases your heart rate and causes you to sweat) each week. In addition, most adults need muscle-strengthening exercises on 2 or more days a week.  Maintain a healthy weight. The body mass index (BMI) is a screening tool to identify possible weight problems. It provides an estimate of body fat based on height and weight. Your health care provider can find your BMI and can help you achieve or maintain a healthy weight.For adults 20 years and older:  A BMI below 18.5 is considered underweight.  A BMI of 18.5 to 24.9 is normal.  A BMI of 25 to 29.9 is considered overweight.  A BMI of 30 and  above is considered obese.  Maintain normal blood lipids and cholesterol levels by exercising and minimizing your intake of saturated fat. Eat a balanced diet with plenty of fruit and vegetables. Blood tests for lipids and cholesterol should begin at age 21 and be repeated every 5 years. If your lipid or cholesterol levels are high, you are over 50, or you are at high risk for heart disease, you may need your cholesterol levels checked more frequently.Ongoing high lipid and cholesterol levels should be treated with medicines if diet and exercise are not working.  If you smoke, find out from your health care provider how to quit. If you do not use tobacco, do not start.  Lung cancer screening is recommended for adults aged 31-80 years who are at high risk for developing lung cancer because of a history of smoking. A yearly low-dose CT scan of the lungs is recommended  for people who have at least a 30-pack-year history of smoking and are a current smoker or have quit within the past 15 years. A pack year of smoking is smoking an average of 1 pack of cigarettes a day for 1 year (for example: 1 pack a day for 30 years or 2 packs a day for 15 years). Yearly screening should continue until the smoker has stopped smoking for at least 15 years. Yearly screening should be stopped for people who develop a health problem that would prevent them from having lung cancer treatment.  High blood pressure causes heart disease and increases the risk of stroke. Your blood pressure should be checked at least every 1 to 2 years. Ongoing high blood pressure should be treated with medicines if weight loss and exercise do not work.  If you are 30-80 years old, ask your health care provider if you should take aspirin to prevent strokes.  Diabetes screening involves taking a blood sample to check your fasting blood sugar level. This should be done once every 3 years, after age 61, if you are within normal weight and without risk  factors for diabetes. Testing should be considered at a younger age or be carried out more frequently if you are overweight and have at least 1 risk factor for diabetes.  Breast cancer screening is essential preventive care for women. You should practice "breast self-awareness." This means understanding the normal appearance and feel of your breasts and may include breast self-examination. Any changes detected, no matter how small, should be reported to a health care provider. Women in their 77s and 30s should have a clinical breast exam (CBE) by a health care provider as part of a regular health exam every 1 to 3 years. After age 81, women should have a CBE every year. Starting at age 20, women should consider having a mammogram (breast X-ray test) every year. Women who have a family history of breast cancer should talk to their health care provider about genetic screening. Women at a high risk of breast cancer should talk to their health care providers about having an MRI and a mammogram every year.  Breast cancer gene (BRCA)-related cancer risk assessment is recommended for women who have family members with BRCA-related cancers. BRCA-related cancers include breast, ovarian, tubal, and peritoneal cancers. Having family members with these cancers may be associated with an increased risk for harmful changes (mutations) in the breast cancer genes BRCA1 and BRCA2. Results of the assessment will determine the need for genetic counseling and BRCA1 and BRCA2 testing.  Routine pelvic exams to screen for cancer are no longer recommended for nonpregnant women who are considered low risk for cancer of the pelvic organs (ovaries, uterus, and vagina) and who do not have symptoms. Ask your health care provider if a screening pelvic exam is right for you.  If you have had past treatment for cervical cancer or a condition that could lead to cancer, you need Pap tests and screening for cancer for at least 20 years after  your treatment. If Pap tests have been discontinued, your risk factors (such as having a new sexual partner) need to be reassessed to determine if screening should be resumed. Some women have medical problems that increase the chance of getting cervical cancer. In these cases, your health care provider may recommend more frequent screening and Pap tests.  Colorectal cancer can be detected and often prevented. Most routine colorectal cancer screening begins at the age of 22 years and  continues through age 74 years. However, your health care provider may recommend screening at an earlier age if you have risk factors for colon cancer. On a yearly basis, your health care provider may provide home test kits to check for hidden blood in the stool. Use of a small camera at the end of a tube, to directly examine the colon (sigmoidoscopy or colonoscopy), can detect the earliest forms of colorectal cancer. Talk to your health care provider about this at age 22, when routine screening begins. Direct exam of the colon should be repeated every 5-10 years through age 76 years, unless early forms of pre-cancerous polyps or small growths are found.  Hepatitis C blood testing is recommended for all people born from [birth year] through 1963 and any individual with known risks for hepatitis C.  Pra  Osteoporosis is a disease in which the bones lose minerals and strength with aging. This can result in serious bone fractures or breaks. The risk of osteoporosis can be identified using a bone density scan. Women ages 88 years and over and women at risk for fractures or osteoporosis should discuss screening with their health care providers. Ask your health care provider whether you should take a calcium supplement or vitamin D to reduce the rate of osteoporosis.  Menopause can be associated with physical symptoms and risks. Hormone replacement therapy is available to decrease symptoms and risks. You should talk to your health care  provider about whether hormone replacement therapy is right for you.  Use sunscreen. Apply sunscreen liberally and repeatedly throughout the day. You should seek shade when your shadow is shorter than you. Protect yourself by wearing long sleeves, pants, a wide-brimmed hat, and sunglasses year round, whenever you are outdoors.  Once a month, do a whole body skin exam, using a mirror to look at the skin on your back. Tell your health care provider of new moles, moles that have irregular borders, moles that are larger than a pencil eraser, or moles that have changed in shape or color.  Stay current with required vaccines (immunizations).  Influenza vaccine. All adults should be immunized every year.  Tetanus, diphtheria, and acellular pertussis (Td, Tdap) vaccine. Pregnant women should receive 1 dose of Tdap vaccine during each pregnancy. The dose should be obtained regardless of the length of time since the last dose. Immunization is preferred during the 27th-36th week of gestation. An adult who has not previously received Tdap or who does not know her vaccine status should receive 1 dose of Tdap. This initial dose should be followed by tetanus and diphtheria toxoids (Td) booster doses every 10 years. Adults with an unknown or incomplete history of completing a 3-dose immunization series with Td-containing vaccines should begin or complete a primary immunization series including a Tdap dose. Adults should receive a Td booster every 10 years.  Varicella vaccine. An adult without evidence of immunity to varicella should receive 2 doses or a second dose if she has previously received 1 dose. Pregnant females who do not have evidence of immunity should receive the first dose after pregnancy. This first dose should be obtained before leaving the health care facility. The second dose should be obtained 4-8 weeks after the first dose.  Human papillomavirus (HPV) vaccine. Females aged 13-26 years who have not  received the vaccine previously should obtain the 3-dose series. The vaccine is not recommended for use in pregnant females. However, pregnancy testing is not needed before receiving a dose. If a female is found to  be pregnant after receiving a dose, no treatment is needed. In that case, the remaining doses should be delayed until after the pregnancy. Immunization is recommended for any person with an immunocompromised condition through the age of 14 years if she did not get any or all doses earlier. During the 3-dose series, the second dose should be obtained 4-8 weeks after the first dose. The third dose should be obtained 24 weeks after the first dose and 16 weeks after the second dose.  Zoster vaccine. One dose is recommended for adults aged 87 years or older unless certain conditions are present.  Measles, mumps, and rubella (MMR) vaccine. Adults born before [birth year] generally are considered immune to measles and mumps. Adults born in [birth year] or later should have 1 or more doses of MMR vaccine unless there is a contraindication to the vaccine or there is laboratory evidence of immunity to each of the three diseases. A routine second dose of MMR vaccine should be obtained at least 28 days after the first dose for students attending postsecondary schools, health care workers, or international travelers. People who received inactivated measles vaccine or an unknown type of measles vaccine during 1963-1967 should receive 2 doses of MMR vaccine. People who received inactivated mumps vaccine or an unknown type of mumps vaccine before 1979 and are at high risk for mumps infection should consider immunization with 2 doses of MMR vaccine. For females of childbearing age, rubella immunity should be determined. If there is no evidence of immunity, females who are not pregnant should be vaccinated. If there is no evidence of immunity, females who are pregnant should delay immunization until after pregnancy. Unvaccinated  health care workers born before [birth year] who lack laboratory evidence of measles, mumps, or rubella immunity or laboratory confirmation of disease should consider measles and mumps immunization with 2 doses of MMR vaccine or rubella immunization with 1 dose of MMR vaccine.  Pneumococcal 13-valent conjugate (PCV13) vaccine. When indicated, a person who is uncertain of her immunization history and has no record of immunization should receive the PCV13 vaccine. An adult aged 48 years or older who has certain medical conditions and has not been previously immunized should receive 1 dose of PCV13 vaccine. This PCV13 should be followed with a dose of pneumococcal polysaccharide (PPSV23) vaccine. The PPSV23 vaccine dose should be obtained at least 8 weeks after the dose of PCV13 vaccine. An adult aged 40 years or older who has certain medical conditions and previously received 1 or more doses of PPSV23 vaccine should receive 1 dose of PCV13. The PCV13 vaccine dose should be obtained 1 or more years after the last PPSV23 vaccine dose.    Pneumococcal polysaccharide (PPSV23) vaccine. When PCV13 is also indicated, PCV13 should be obtained first. All adults aged 66 years and older should be immunized. An adult younger than age 28 years who has certain medical conditions should be immunized. Any person who resides in a nursing home or long-term care facility should be immunized. An adult smoker should be immunized. People with an immunocompromised condition and certain other conditions should receive both PCV13 and PPSV23 vaccines. People with human immunodeficiency virus (HIV) infection should be immunized as soon as possible after diagnosis. Immunization during chemotherapy or radiation therapy should be avoided. Routine use of PPSV23 vaccine is not recommended for American Indians, [place] Natives, or people younger than 65 years unless there are medical conditions that require PPSV23 vaccine. When indicated, people who  have unknown immunization and have no record of  immunization should receive PPSV23 vaccine. One-time revaccination 5 years after the first dose of PPSV23 is recommended for people aged 19-64 years who have chronic kidney failure, nephrotic syndrome, asplenia, or immunocompromised conditions. People who received 1-2 doses of PPSV23 before age 43 years should receive another dose of PPSV23 vaccine at age 75 years or later if at least 5 years have passed since the previous dose. Doses of PPSV23 are not needed for people immunized with PPSV23 at or after age 75 years.  Preventive Services / Frequency   Ages 38 to 43 years  Blood pressure check.  Lipid and cholesterol check.  Lung cancer screening. / Every year if you are aged 57-80 years and have a 30-pack-year history of smoking and currently smoke or have quit within the past 15 years. Yearly screening is stopped once you have quit smoking for at least 15 years or develop a health problem that would prevent you from having lung cancer treatment.  Clinical breast exam.** / Every year after age 95 years.  BRCA-related cancer risk assessment.** / For women who have family members with a BRCA-related cancer (breast, ovarian, tubal, or peritoneal cancers).  Mammogram.** / Every year beginning at age 63 years and continuing for as long as you are in good health. Consult with your health care provider.  Pap test.** / Every 3 years starting at age 87 years through age 20 or 53 years with a history of 3 consecutive normal Pap tests.  HPV screening.** / Every 3 years from ages 84 years through ages 19 to 62 years with a history of 3 consecutive normal Pap tests.  Fecal occult blood test (FOBT) of stool. / Every year beginning at age 50 years and continuing until age 59 years. You may not need to do this test if you get a colonoscopy every 10 years.  Flexible sigmoidoscopy or colonoscopy.** / Every 5 years for a flexible sigmoidoscopy or every 10 years  for a colonoscopy beginning at age 73 years and continuing until age 77 years.  Hepatitis C blood test.** / For all people born from [birth year] through 1963 and any individual with known risks for hepatitis C.  Skin self-exam. / Monthly.  Influenza vaccine. / Every year.  Tetanus, diphtheria, and acellular pertussis (Tdap/Td) vaccine.** / Consult your health care provider. Pregnant women should receive 1 dose of Tdap vaccine during each pregnancy. 1 dose of Td every 10 years.  Varicella vaccine.** / Consult your health care provider. Pregnant females who do not have evidence of immunity should receive the first dose after pregnancy.  Zoster vaccine.** / 1 dose for adults aged 32 years or older.  Pneumococcal 13-valent conjugate (PCV13) vaccine.** / Consult your health care provider.  Pneumococcal polysaccharide (PPSV23) vaccine.** / 1 to 2 doses if you smoke cigarettes or if you have certain conditions.  Meningococcal vaccine.** / Consult your health care provider.  Hepatitis A vaccine.** / Consult your health care provider.  Hepatitis B vaccine.** / Consult your health care provider. Screening for abdominal aortic aneurysm (AAA)  by ultrasound is recommended for people over 50 who have history of high blood pressure or who are current or former smokers.

## 2015-05-21 NOTE — Progress Notes (Signed)
Patient ID: Beth [name], female   DOB: September 04, 1950, 65 y.o.   MRN: [id number]  Annual Screening/Preventative Visit And Comprehensive Evaluation &  Examination  This very nice 65 y.o. DWF presents for a Wellness/Preventative Visit & comprehensive evaluation and management of multiple medical co-morbidities.  Patient has been followed for HTN, T2_NIDDM, Hyperlipidemia and Vitamin D Deficiency.    HTN predates since 2007. Patient's BP has been controlled at home and patient denies any cardiac symptoms as chest pain, palpitations, shortness of breath, dizziness or ankle swelling. Today's BP is 130 /94 and rechecked at 132/86.     Patient's hyperlipidemia is controlled with diet and medications. Patient denies myalgias or other medication SE's. Last lipids were at goal with Cholesterol 153; HDL 63; LDL 75; Triglycerides 75 on 02/21/2015.   Patient has hx/o Morbid Obesity (BMI 42+) and consequent T2_NIDDM predating since 2008 and patient denies reactive hypoglycemic symptoms, visual blurring, diabetic polys, or paresthesias. Last A1c was 5.6% on 02/21/2015.    Finally, patient has history of Vitamin D Deficiency  Of "13" in 2008 and last Vitamin D was  >150 in Dec 2016 and dose was d/c'd.    Medication Sig  . albuterol  HFA  inhaler Inhale 2 puffs into the lungs every 6 (six) hours as needed for wheezing.  Marland Kitchen ALPRAZolam (XANAX) 0.25 MG tablet TAKE 1 TABLET BY MOUTH TWICE DAILY  . aspirin 81 MG tablet Take 81 mg by mouth daily.  Marland Kitchen BENICAR HCT 40-12.5 MG tablet TAKE 1 TABLET BY MOUTH ONCE DAILY  . Cyanocobalamin (VITAMIN B-12 PO) Take 100 mg by mouth daily.   . cyclobenzaprine  10 MG tablet Take 1/2 to 1 tablet by  mouth 3 times daily for  muscle spasms  . fexofenadine  180 MG tablet Take 180 mg by mouth daily.  Marland Kitchen FLONASE nasal spray Place 2 sprays into the nose daily.  . hyoscyamine  0.375 MG 12 hr TAKE 1 TABLET BY MOUTH TWICE DAILY (Patient taking differently: daily)  . MELATONIN PO Take 5 mg by  mouth at bedtime.   . meloxicam  15 MG tablet TAKE 1 TABLET BY MOUTH ONCE DAILY, AFTER A MEAL AS NEEDED FOR PAIN AND INFLAMMATION  . metFORMIN-XR 500 MG 24 hr  TAKE 1 TABLET BY MOUTH AT BREAKFAST AND LUNCH AND 2 BY MOUTH AT SUPPER  . metoCLOPramide  10 MG  TAKE 1 TAB FOUR TIMES DAILY BEFORE A MEAL AND EVERY NIGHT AT BEDTIME FOR ACID REFLUX  . montelukast  10 MG tablet TAKE 1 TABLET BY MOUTH EVERY DAY  . nystatin cream  Apply 1 application topically 2 (two) times daily   Vitamin C with cranberry  1 daily.  . pantoprazole  40 MG tablet Take 1 tablet by mouth  daily for acid indigestion  . phentermine  37.5 MG tablet TK 1/2 TO  1 T PO QAM FOR DIET  . PROBIOTIC Take by mouth daily.  . simvastatin  40 MG tablet Take 1 tablet by mouth  every evening  . venlafaxine-XR 150 MG 24 hr  TK ONE C PO BID  . zaleplon  10 MG capsule Take 10 mg by mouth at bedtime as needed.   Allergies  Allergen Reactions  . Penicillins Rash and Anaphylaxis  . Sulfa Antibiotics Shortness Of Breath    Also light headed, rash  . Celebrex [Celecoxib]     Elevates Blood Pressure   . Ciprofloxacin Nausea And Vomiting  . Codeine Nausea And Vomiting  And hydrocodone  . Levaquin [Levofloxacin] Nausea And Vomiting  . Motrin Ib [Ibuprofen] Hives  . Prempro [Conj Estrog-Medroxyprogest Ace] Hives   Past Medical History  Diagnosis Date  . IBS (irritable bowel syndrome) 12/89  . Bell's palsy 01/1985  . Obesity, Class III, BMI 40-49.9 (morbid obesity) ([place])   . Anxiety   . Vitamin D deficiency   . OSA (obstructive sleep apnea)    Health Maintenance  Topic Date Due  . FOOT EXAM  11/06/1962  . INFLUENZA VACCINE  10/08/2014  . TETANUS/TDAP  03/10/2015  . HEMOGLOBIN A1C  08/22/2015  . OPHTHALMOLOGY EXAM  11/21/2015  . MAMMOGRAM  12/02/2016  . PAP SMEAR  12/05/2016  . COLONOSCOPY  10/13/2022  . PNEUMOCOCCAL POLYSACCHARIDE VACCINE  Completed  . ZOSTAVAX  Completed  . Hepatitis C Screening  Completed  . HIV Screening   Completed   Immunization History  Administered Date(s) Administered  . PPD Test 03/21/2013  . Pneumococcal Conjugate-13 01/04/2014  . Pneumococcal Polysaccharide-23 03/09/2004  . Td 03/09/2004  . Tdap 03/09/2005  . Typhoid Inactivated 05/10/2014  . Zoster 12/22/2012   Past Surgical History  Procedure Laterality Date  . Colonoscopy  05/2005    Normal, recheck 10 yrs  . 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 Bladder suspension  03/2006, 07/2006, 01/2007    for stress incontinence  . Total knee revision  2014  . Tonsillectomy and adenoidectomy     Family History  Problem Relation Age of Onset  . Hypertension Father   . Diabetes Father   . Parkinson's disease Father   . CAD Paternal Grandfather 7    Died MI  . Dementia Mother    Social History  Substance Use Topics  . Smoking status: Current Every Day Smoker -- 0.50 packs/day    Types: Cigarettes  . Smokeless tobacco: Never Used  . Alcohol Use: No    ROS Constitutional: Denies fever, chills, weight loss/gain, headaches, insomnia,  night sweats, and change in appetite. Does c/o fatigue. Eyes: Denies redness, blurred vision, diplopia, discharge, itchy, watery eyes.  ENT: Denies discharge, congestion, post nasal drip, epistaxis, sore throat, earache, hearing loss, dental pain, Tinnitus, Vertigo, Sinus pain, snoring.  Cardio: Denies chest pain, palpitations, irregular heartbeat, syncope, dyspnea, diaphoresis, orthopnea, PND, claudication, edema Respiratory: denies cough, dyspnea, DOE, pleurisy, hoarseness, laryngitis, wheezing.  Gastrointestinal: Denies dysphagia, heartburn, reflux, water brash, pain, cramps, nausea, vomiting, bloating, diarrhea, constipation, hematemesis, melena, hematochezia, jaundice, hemorrhoids Genitourinary: Denies dysuria, frequency, urgency, nocturia, hesitancy, discharge, hematuria, flank pain Breast: Breast lumps, nipple discharge, bleeding.  Musculoskeletal: Denies arthralgia, myalgia,  stiffness, Jt. Swelling, pain, limp, and strain/sprain. Denies falls. Skin: Denies puritis, rash, hives, warts, acne, eczema, changing in skin lesion Neuro: No weakness, tremor, incoordination, spasms, paresthesia, pain Psychiatric: Denies confusion, memory loss, sensory loss. Denies Depression. Endocrine: Denies change in weight, skin, hair change, nocturia, and paresthesia, diabetic polys, visual blurring, hyper / hypo glycemic episodes.  Heme/Lymph: No excessive bleeding, bruising, enlarged lymph nodes.  Physical Exam  BP 130/94 re-ck'd at 132/86  Pulse 84  Temp97.8 F   Resp 16  Ht 5\' 5"    Wt 256 lb 9.6 oz   42.70   LMP 04/09/2009  General Appearance: Over nourished and morbidly obese and in no apparent distress. Eyes: PERRLA, EOMs, conjunctiva no swelling or erythema, normal fundi and vessels. Sinuses: No frontal/maxillary tenderness ENT/Mouth: EACs patent / TMs  nl. Nares clear without erythema, swelling, mucoid exudates. Oral hygiene is good.  No erythema, swelling, or exudate. Tongue normal, non-obstructing. Tonsils not swollen or erythematous. Hearing normal.  Neck: Supple, thyroid normal. No bruits, nodes or JVD. Respiratory: Respiratory effort normal.  BS equal and clear bilateral without rales, rhonci, wheezing or stridor. Cardio: Heart sounds are normal with regular rate and rhythm and no murmurs, rubs or gallops. Peripheral pulses are normal and equal bilaterally without edema. No aortic or femoral bruits. Chest: symmetric with normal excursions and percussion. Breasts: Symmetric, without lumps, nipple discharge, retractions, or fibrocystic changes.  Abdomen: Flat, soft, with bowel sounds. Nontender, no guarding, rebound, hernias, masses, or organomegaly.  Lymphatics: Non tender without lymphadenopathy.  Musculoskeletal: Full ROM all peripheral extremities, joint stability, 5/5 strength, and normal gait. Skin: Warm and dry without rashes, lesions, cyanosis, clubbing or   ecchymosis.  Neuro: Cranial nerves intact, reflexes equal bilaterally. Normal muscle tone, no cerebellar symptoms. Sensation intact to touch and monofilament.  Pysch: Alert and oriented X 3, normal affect, Insight and Judgment appropriate.   Assessment and Plan  1. Annual Preventative Screening Examination  - Microalbumin / creatinine urine ratio - EKG 12-Lead - [place], RETROPERITNL ABD,  LTD - POC Hemoccult Bld/Stl  - Urinalysis, Routine w reflex microscopic  - Vitamin B12 - Iron and TIBC - HM DIABETES FOOT EXAM - LOW EXTREMITY NEUR EXAM DOCUM - CBC with Differential/Platelet - BASIC METABOLIC PANEL WITH GFR - Hepatic function panel - Magnesium - Lipid panel - TSH - Hemoglobin A1c - Insulin, random - VITAMIN D 25 Hydroxy   2. Hypertension  - EKG 12-Lead - [place], RETROPERITNL ABD,  LTD - TSH  3. Hyperlipidemia  - Lipid panel - TSH  4. Controlled type 2 diabetes mellitus with chronic kidney disease, without long-term current use of insulin, unspecified CKD stage (HCC)  - Microalbumin / creatinine urine ratio - HM DIABETES FOOT EXAM - LOW EXTREMITY NEUR EXAM DOCUM - Hemoglobin A1c - Insulin, random  5. Vitamin D deficiency  - VITAMIN D 25 Hydroxy   6. Obesity, Class III, BMI 40-49.9 (morbid obesity) ([place])   7. Gastroesophageal reflux disease   8. IBS (irritable bowel syndrome)   9. Screening for rectal cancer  - POC Hemoccult Bld/Stl   10. Other fatigue  - Vitamin B12 - Iron and TIBC - CBC with Differential/Platelet - TSH  11. Medication management  - Urinalysis, Routine w reflex microscopic  - CBC with Differential/Platelet - BASIC METABOLIC PANEL WITH GFR - Hepatic function panel - Magnesium   Continue prudent diet as discussed, weight control, BP monitoring, regular exercise, and medications. Discussed med's effects and SE's. Screening labs and tests as requested with regular follow-up as recommended. Over 40 minutes of exam, counseling, chart  review and high complex critical decision making was performed.

## 2015-05-22 ENCOUNTER — Other Ambulatory Visit: Payer: Self-pay | Admitting: Internal Medicine

## 2015-05-22 ENCOUNTER — Encounter: Payer: Self-pay | Admitting: Internal Medicine

## 2015-05-22 LAB — URINALYSIS, ROUTINE W REFLEX MICROSCOPIC
Bilirubin Urine: NEGATIVE
GLUCOSE, UA: NEGATIVE
KETONES UR: NEGATIVE
NITRITE: NEGATIVE
PH: 5 (ref 5.0–8.0)
PROTEIN: NEGATIVE
Specific Gravity, Urine: 1.017 (ref 1.001–1.035)

## 2015-05-22 LAB — MICROALBUMIN / CREATININE URINE RATIO
CREATININE, URINE: 88 mg/dL (ref 20–320)
MICROALB UR: 0.5 mg/dL
MICROALB/CREAT RATIO: 6 ug/mg{creat} (ref <30)

## 2015-05-22 LAB — URINALYSIS, MICROSCOPIC ONLY
Casts: NONE SEEN [LPF]
Crystals: NONE SEEN [HPF]
Yeast: NONE SEEN [HPF]

## 2015-05-22 LAB — HEMOGLOBIN A1C
HEMOGLOBIN A1C: 6.3 % — AB (ref <5.7)
Mean Plasma Glucose: 134 mg/dL — ABNORMAL HIGH (ref <117)

## 2015-05-22 LAB — INSULIN, RANDOM: Insulin: 21.7 u[IU]/mL — ABNORMAL HIGH (ref 2.0–19.6)

## 2015-05-22 LAB — VITAMIN D 25 HYDROXY (VIT D DEFICIENCY, FRACTURES): VIT D 25 HYDROXY: 125 ng/mL — AB (ref 30–100)

## 2015-05-25 LAB — URINE CULTURE

## 2015-05-26 ENCOUNTER — Other Ambulatory Visit: Payer: Self-pay | Admitting: Internal Medicine

## 2015-05-26 DIAGNOSIS — N390 Urinary tract infection, site not specified: Secondary | ICD-10-CM

## 2015-05-26 MED ORDER — CEFUROXIME AXETIL 250 MG PO TABS: ORAL_TABLET | ORAL | Status: AC

## 2015-05-27 LAB — TB SKIN TEST
INDURATION: 0 mm
TB Skin Test: NEGATIVE

## 2015-06-18 ENCOUNTER — Encounter: Payer: Self-pay | Admitting: Internal Medicine

## 2015-06-19 ENCOUNTER — Other Ambulatory Visit: Payer: Self-pay | Admitting: Physician Assistant

## 2015-06-25 ENCOUNTER — Ambulatory Visit (INDEPENDENT_AMBULATORY_CARE_PROVIDER_SITE_OTHER): Payer: 59 | Admitting: *Deleted

## 2015-06-25 DIAGNOSIS — N390 Urinary tract infection, site not specified: Secondary | ICD-10-CM

## 2015-06-25 NOTE — Progress Notes (Signed)
Patient ID: Beth [name], female   DOB: 1950/12/11, 65 y.o.   MRN: [id number] Patient presents for 1 month recheck UA, C&S.

## 2015-06-26 LAB — URINALYSIS, MICROSCOPIC ONLY
BACTERIA UA: NONE SEEN [HPF]
CASTS: NONE SEEN [LPF]
CRYSTALS: NONE SEEN [HPF]
RBC / HPF: NONE SEEN RBC/HPF (ref <=2)
Yeast: NONE SEEN [HPF]

## 2015-06-26 LAB — URINE CULTURE
Colony Count: NO GROWTH
Organism ID, Bacteria: NO GROWTH

## 2015-06-26 LAB — URINALYSIS, ROUTINE W REFLEX MICROSCOPIC
BILIRUBIN URINE: NEGATIVE
GLUCOSE, UA: NEGATIVE
HGB URINE DIPSTICK: NEGATIVE
KETONES UR: NEGATIVE
Nitrite: NEGATIVE
PH: 5.5 (ref 5.0–8.0)
PROTEIN: NEGATIVE
Specific Gravity, Urine: 1.017 (ref 1.001–1.035)

## 2015-07-09 ENCOUNTER — Other Ambulatory Visit: Payer: Self-pay | Admitting: Physician Assistant

## 2015-07-09 ENCOUNTER — Other Ambulatory Visit: Payer: Self-pay | Admitting: *Deleted

## 2015-07-09 DIAGNOSIS — Z0001 Encounter for general adult medical examination with abnormal findings: Secondary | ICD-10-CM

## 2015-07-09 DIAGNOSIS — Z1212 Encounter for screening for malignant neoplasm of rectum: Secondary | ICD-10-CM

## 2015-07-09 LAB — POC HEMOCCULT BLD/STL (HOME/3-CARD/SCREEN)
FECAL OCCULT BLD: NEGATIVE
FECAL OCCULT BLD: NEGATIVE
FECAL OCCULT BLD: NEGATIVE

## 2015-07-16 ENCOUNTER — Other Ambulatory Visit: Payer: Self-pay | Admitting: Internal Medicine

## 2015-08-15 ENCOUNTER — Other Ambulatory Visit: Payer: Self-pay | Admitting: Internal Medicine

## 2015-08-17 ENCOUNTER — Encounter: Payer: Self-pay | Admitting: Internal Medicine

## 2015-08-18 ENCOUNTER — Other Ambulatory Visit: Payer: Self-pay | Admitting: Internal Medicine

## 2015-08-18 DIAGNOSIS — I10 Essential (primary) hypertension: Secondary | ICD-10-CM

## 2015-08-18 MED ORDER — OLMESARTAN MEDOXOMIL-HCTZ 40-12.5 MG PO TABS: 1.0000 | ORAL_TABLET | Freq: Every day | ORAL | Status: DC

## 2015-08-22 ENCOUNTER — Other Ambulatory Visit: Payer: Self-pay | Admitting: Internal Medicine

## 2015-09-05 ENCOUNTER — Ambulatory Visit (INDEPENDENT_AMBULATORY_CARE_PROVIDER_SITE_OTHER): Payer: 59 | Admitting: Internal Medicine

## 2015-09-05 VITALS — BP 110/62 | HR 90 | Temp 98.0°F | Resp 18 | Ht 65.0 in | Wt 258.0 lb

## 2015-09-05 DIAGNOSIS — R109 Unspecified abdominal pain: Secondary | ICD-10-CM | POA: Diagnosis not present

## 2015-09-05 DIAGNOSIS — Z79899 Other long term (current) drug therapy: Secondary | ICD-10-CM

## 2015-09-05 DIAGNOSIS — E1122 Type 2 diabetes mellitus with diabetic chronic kidney disease: Secondary | ICD-10-CM | POA: Diagnosis not present

## 2015-09-05 DIAGNOSIS — I10 Essential (primary) hypertension: Secondary | ICD-10-CM | POA: Diagnosis not present

## 2015-09-05 DIAGNOSIS — E782 Mixed hyperlipidemia: Secondary | ICD-10-CM

## 2015-09-05 DIAGNOSIS — E559 Vitamin D deficiency, unspecified: Secondary | ICD-10-CM | POA: Diagnosis not present

## 2015-09-05 DIAGNOSIS — R252 Cramp and spasm: Secondary | ICD-10-CM | POA: Diagnosis not present

## 2015-09-05 MED ORDER — BACLOFEN 10 MG PO TABS: 10.0000 mg | ORAL_TABLET | Freq: Two times a day (BID) | ORAL | Status: DC

## 2015-09-05 NOTE — Progress Notes (Signed)
Assessment and Plan:  Hypertension:  -Continue medication -monitor blood pressure at home. -Continue DASH diet -Reminder to go to the ER if any CP, SOB, nausea, dizziness, severe HA, changes vision/speech, left arm numbness and tingling and jaw pain.  Cholesterol - Continue diet and exercise -Check cholesterol.   Diabetes with diabetic chronic kidney disease -Continue diet and exercise.  -Check A1C  Vitamin D Def -check level  Muscle spasms -stop flexeril -try baclofen -encouraged increase hydration -increase magnesium to twice daily -stretch  Abdominal pain -try hyoscamine with cramping -food journal  Continue diet and meds as discussed. Further disposition pending results of labs. Discussed med's effects and SE's.    HPI 65 y.o. female  presents for 3 month follow up with hypertension, hyperlipidemia, diabetes and vitamin D deficiency.   Her blood pressure has been controlled at home, today their BP is BP: 110/62 mmHg.She does not workout. She denies chest pain, shortness of breath, dizziness.   She is on cholesterol medication and denies myalgias. Her cholesterol is at goal. The cholesterol was:  05/21/2015: Cholesterol 150; HDL 61; LDL Cholesterol 72; Triglycerides 83   She has been working on diet and exercise for diabetes with diabetic chronic kidney disease, she is on bASA, she is on ACE/ARB, and denies  foot ulcerations, hyperglycemia, hypoglycemia , increased appetite, nausea, paresthesia of the feet, polydipsia, polyuria, visual disturbances, vomiting and weight loss. Last A1C was: 05/21/2015: Hgb A1c MFr Bld 6.3*   Patient is on Vitamin D supplement. 05/21/2015: Vit D, 25-Hydroxy 125*  Patient reports that she is having leg and foot cramps nearly nightly.  She reports that she has to get up and walk around and drink water.  These have gotten worse recently.  She drinks a lot of iced tea and diet pepsi.  This happens despite taking xanax and flexeril together.     She reports that she does get the occasional muscle cramp in the periumbilical area.  She reports that this happens once weekly.  This only occurs with laying on her back in bed.  She reports that it does improvement with movements.  She reports that we have never figured out why she has them.  She has never taken the hyoscyamine when this happens.     Current Medications:  Current Outpatient Prescriptions on File Prior to Visit  Medication Sig Dispense Refill  . ACCU-CHEK AVIVA PLUS test strip USE AS DIRECTED 300 each 6  . albuterol (PROVENTIL HFA;VENTOLIN HFA) 108 (90 BASE) MCG/ACT inhaler Inhale 2 puffs into the lungs every 6 (six) hours as needed for wheezing.    Marland Kitchen ALPRAZolam (XANAX) 0.25 MG tablet TAKE 1 TABLET BY MOUTH TWICE DAILY 60 tablet 0  . aspirin 81 MG tablet Take 81 mg by mouth daily.    . ciclesonide (OMNARIS) 50 MCG/ACT nasal spray Place 2 sprays into both nostrils daily. prn    . Cyanocobalamin (VITAMIN B-12 PO) Take 100 mg by mouth daily.     . cyclobenzaprine (FLEXERIL) 10 MG tablet Take 1/2 to 1 tablet by  mouth 3 times daily for  muscle spasms 270 tablet 0  . fexofenadine (ALLEGRA) 180 MG tablet Take 180 mg by mouth daily.    . fluticasone (FLONASE) 50 MCG/ACT nasal spray Place 2 sprays into the nose daily.    . hyoscyamine (LEVBID) 0.375 MG 12 hr tablet TAKE 1 TABLET BY MOUTH TWICE DAILY (Patient taking differently: daily) 180 tablet 98  . Magnesium 250 MG TABS Take 1 tablet by mouth  daily.    Marland Kitchen MELATONIN PO Take 5 mg by mouth at bedtime.     . meloxicam (MOBIC) 15 MG tablet TAKE 1 TABLET BY MOUTH EVERY DAY AFTER A MEAL AS NEEDED FOR PAIN AND INFLAMMATION 90 tablet 1  . metFORMIN (GLUCOPHAGE-XR) 500 MG 24 hr tablet TAKE 1 TABLET BY MOUTH AT BREAKFAST AND LUNCH AND 2 BY MOUTH AT SUPPER 360 tablet 1  . metoCLOPramide (REGLAN) 10 MG tablet TAKE 1 TABLET BY MOUTH FOUR TIMES DAILY BEFORE A MEAL AND EVERY NIGHT AT BEDTIME FOR ACID REFLUX 360 tablet 99  . montelukast  (SINGULAIR) 10 MG tablet TAKE 1 TABLET BY MOUTH EVERY DAY 90 tablet 3  . montelukast (SINGULAIR) 10 MG tablet TAKE 1 TABLET BY MOUTH EVERY DAY 90 tablet 0  . montelukast (SINGULAIR) 10 MG tablet TAKE 1 TABLET BY MOUTH EVERY DAY 90 tablet 0  . nystatin cream (MYCOSTATIN) Apply 1 application topically 2 (two) times daily. Apply to affected area BID for up to 7 days. 30 g 0  . olmesartan-hydrochlorothiazide (BENICAR HCT) 40-12.5 MG tablet Take 1 tablet by mouth daily. 90 tablet 1  . OVER THE COUNTER MEDICATION Iron 325 mg daily    . OVER THE COUNTER MEDICATION Vitamin C with cranberry 1 daily.    . pantoprazole (PROTONIX) 40 MG tablet Take 1 tablet by mouth  daily for acid indigestion 90 tablet 4  . Probiotic Product (PROBIOTIC PO) Take by mouth daily.    . simvastatin (ZOCOR) 40 MG tablet Take 1 tablet by mouth  every evening 90 tablet 4  . venlafaxine XR (EFFEXOR-XR) 150 MG 24 hr capsule TK ONE C PO BID  4  . Vitamin D, Ergocalciferol, (DRISDOL) 50000 UNITS CAPS capsule Take 1 capsule daily or as directed 40 capsule 99  . zaleplon (SONATA) 10 MG capsule Take 10 mg by mouth at bedtime as needed.     No current facility-administered medications on file prior to visit.   Medical History:  Past Medical History  Diagnosis Date  . IBS (irritable bowel syndrome) 12/89  . Bell's palsy 01/1985  . Obesity, Class III, BMI 40-49.9 (morbid obesity) ([place])   . Anxiety   . Vitamin D deficiency   . OSA (obstructive sleep apnea)    Allergies:  Allergies  Allergen Reactions  . Penicillins Rash and Anaphylaxis  . Sulfa Antibiotics Shortness Of Breath    Also light headed, rash  . Celebrex [Celecoxib]     Elevates Blood Pressure   . Ciprofloxacin Nausea And Vomiting  . Codeine Nausea And Vomiting    And hydrocodone  . Levaquin [Levofloxacin] Nausea And Vomiting  . Motrin Ib [Ibuprofen] Hives  . Prempro [Conj Estrog-Medroxyprogest Ace] Hives     Review of Systems:  Review of Systems   Constitutional: Negative for fever, chills and malaise/fatigue.  HENT: Negative for congestion, ear pain and sore throat.   Eyes: Negative.   Respiratory: Negative for cough, shortness of breath and wheezing.   Cardiovascular: Negative for chest pain, palpitations and leg swelling.  Gastrointestinal: Negative for heartburn, abdominal pain, diarrhea, constipation, blood in stool and melena.  Genitourinary: Negative.   Musculoskeletal: Positive for myalgias.  Skin: Negative.   Neurological: Negative for dizziness, sensory change, loss of consciousness and headaches.  Psychiatric/Behavioral: Negative for depression. The patient is not nervous/anxious and does not have insomnia.     Family history- Review and unchanged  Social history- Review and unchanged  Physical Exam: BP 110/62 mmHg  Pulse 90  Temp(Src)  98 F (36.7 C) (Temporal)  Resp 18  Ht 5\' 5"  (1.651 m)  Wt 258 lb (117.028 kg)  BMI 42.93 kg/m2  LMP 04/09/2009 Wt Readings from Last 3 Encounters:  09/05/15 258 lb (117.028 kg)  05/21/15 256 lb 9.6 oz (116.393 kg)  02/21/15 248 lb (112.492 kg)   General Appearance: Well nourished well developed, non-toxic appearing, in no apparent distress. Eyes: PERRLA, EOMs, conjunctiva no swelling or erythema ENT/Mouth: Ear canals clear with no erythema, swelling, or discharge.  TMs normal bilaterally, oropharynx clear, moist, with no exudate.   Neck: Supple, thyroid normal, no JVD, no cervical adenopathy.  Respiratory: Respiratory effort normal, breath sounds clear A&P, no wheeze, rhonchi or rales noted.  No retractions, no accessory muscle usage Cardio: RRR with no MRGs. No noted edema.  Abdomen: Soft, + BS.  Non tender, no guarding, rebound, hernias, masses. Musculoskeletal: Full ROM, 5/5 strength, Normal gait Skin: Warm, dry without rashes, lesions, ecchymosis.  Neuro: Awake and oriented X 3, Cranial nerves intact. No cerebellar symptoms.  Psych: normal affect, Insight and Judgment  appropriate.    Starlyn Skeans, PA-C 3:00 PM [facility]

## 2015-09-06 LAB — HEPATIC FUNCTION PANEL
ALBUMIN: 3.7 g/dL (ref 3.6–5.1)
ALT: 25 U/L (ref 6–29)
AST: 24 U/L (ref 10–35)
Alkaline Phosphatase: 162 U/L — ABNORMAL HIGH (ref 33–130)
BILIRUBIN DIRECT: 0.1 mg/dL (ref <=0.2)
BILIRUBIN TOTAL: 0.2 mg/dL (ref 0.2–1.2)
Indirect Bilirubin: 0.1 mg/dL — ABNORMAL LOW (ref 0.2–1.2)
Total Protein: 6.6 g/dL (ref 6.1–8.1)

## 2015-09-06 LAB — CBC WITH DIFFERENTIAL/PLATELET
BASOS ABS: 81 {cells}/uL (ref 0–200)
Basophils Relative: 1 %
EOS PCT: 4 %
Eosinophils Absolute: 324 cells/uL (ref 15–500)
HCT: 37.5 % (ref 35.0–45.0)
Hemoglobin: 11.9 g/dL (ref 11.7–15.5)
Lymphocytes Relative: 28 %
Lymphs Abs: 2268 cells/uL (ref 850–3900)
MCH: 26.6 pg — AB (ref 27.0–33.0)
MCHC: 31.7 g/dL — AB (ref 32.0–36.0)
MCV: 83.9 fL (ref 80.0–100.0)
MONOS PCT: 9 %
MPV: 9.9 fL (ref 7.5–12.5)
Monocytes Absolute: 729 cells/uL (ref 200–950)
NEUTROS ABS: 4698 {cells}/uL (ref 1500–7800)
NEUTROS PCT: 58 %
PLATELETS: 240 10*3/uL (ref 140–400)
RBC: 4.47 MIL/uL (ref 3.80–5.10)
RDW: 16.1 % — ABNORMAL HIGH (ref 11.0–15.0)
WBC: 8.1 10*3/uL (ref 3.8–10.8)

## 2015-09-06 LAB — BASIC METABOLIC PANEL WITH GFR
BUN: 15 mg/dL (ref 7–25)
CALCIUM: 9.5 mg/dL (ref 8.6–10.4)
CO2: 28 mmol/L (ref 20–31)
CREATININE: 0.78 mg/dL (ref 0.50–0.99)
Chloride: 100 mmol/L (ref 98–110)
GFR, EST NON AFRICAN AMERICAN: 82 mL/min (ref >=60)
Glucose, Bld: 107 mg/dL — ABNORMAL HIGH (ref 65–99)
Potassium: 4.4 mmol/L (ref 3.5–5.3)
SODIUM: 138 mmol/L (ref 135–146)

## 2015-09-06 LAB — TSH: TSH: 2.56 mIU/L

## 2015-09-06 LAB — LIPID PANEL
CHOL/HDL RATIO: 2.6 ratio (ref <=5.0)
CHOLESTEROL: 151 mg/dL (ref 125–200)
HDL: 59 mg/dL (ref >=46)
LDL Cholesterol: 69 mg/dL (ref <130)
TRIGLYCERIDES: 115 mg/dL (ref <150)
VLDL: 23 mg/dL (ref <30)

## 2015-09-06 LAB — HEMOGLOBIN A1C
Hgb A1c MFr Bld: 6.1 % — ABNORMAL HIGH (ref <5.7)
Mean Plasma Glucose: 128 mg/dL

## 2015-09-06 LAB — VITAMIN D 25 HYDROXY (VIT D DEFICIENCY, FRACTURES): Vit D, 25-Hydroxy: 65 ng/mL (ref 30–100)

## 2015-09-08 ENCOUNTER — Other Ambulatory Visit: Payer: Self-pay | Admitting: Internal Medicine

## 2015-09-10 ENCOUNTER — Encounter: Payer: Self-pay | Admitting: Internal Medicine

## 2015-09-13 ENCOUNTER — Other Ambulatory Visit: Payer: Self-pay | Admitting: Internal Medicine

## 2015-09-18 ENCOUNTER — Other Ambulatory Visit: Payer: Self-pay | Admitting: Internal Medicine

## 2015-09-18 ENCOUNTER — Other Ambulatory Visit: Payer: Self-pay | Admitting: Physician Assistant

## 2015-09-19 NOTE — Progress Notes (Signed)
RX CALLED INTO PHARMACY

## 2015-09-20 ENCOUNTER — Encounter: Payer: Self-pay | Admitting: Internal Medicine

## 2015-10-11 ENCOUNTER — Other Ambulatory Visit: Payer: Self-pay | Admitting: Internal Medicine

## 2015-10-23 ENCOUNTER — Other Ambulatory Visit: Payer: Self-pay | Admitting: Internal Medicine

## 2015-11-19 ENCOUNTER — Other Ambulatory Visit: Payer: Self-pay | Admitting: Internal Medicine

## 2015-12-09 ENCOUNTER — Ambulatory Visit (INDEPENDENT_AMBULATORY_CARE_PROVIDER_SITE_OTHER): Payer: 59 | Admitting: Internal Medicine

## 2015-12-09 ENCOUNTER — Encounter: Payer: Self-pay | Admitting: Internal Medicine

## 2015-12-09 VITALS — BP 110/82 | HR 76 | Temp 97.4°F | Resp 16 | Ht 65.0 in | Wt 257.6 lb

## 2015-12-09 DIAGNOSIS — E559 Vitamin D deficiency, unspecified: Secondary | ICD-10-CM

## 2015-12-09 DIAGNOSIS — E782 Mixed hyperlipidemia: Secondary | ICD-10-CM

## 2015-12-09 DIAGNOSIS — K219 Gastro-esophageal reflux disease without esophagitis: Secondary | ICD-10-CM | POA: Diagnosis not present

## 2015-12-09 DIAGNOSIS — Z23 Encounter for immunization: Secondary | ICD-10-CM | POA: Diagnosis not present

## 2015-12-09 DIAGNOSIS — Z79899 Other long term (current) drug therapy: Secondary | ICD-10-CM

## 2015-12-09 DIAGNOSIS — I10 Essential (primary) hypertension: Secondary | ICD-10-CM

## 2015-12-09 DIAGNOSIS — E1122 Type 2 diabetes mellitus with diabetic chronic kidney disease: Secondary | ICD-10-CM | POA: Diagnosis not present

## 2015-12-09 LAB — BASIC METABOLIC PANEL WITH GFR
BUN: 17 mg/dL (ref 7–25)
CHLORIDE: 102 mmol/L (ref 98–110)
CO2: 28 mmol/L (ref 20–31)
Calcium: 9.5 mg/dL (ref 8.6–10.4)
Creat: 0.81 mg/dL (ref 0.50–0.99)
GFR, EST NON AFRICAN AMERICAN: 78 mL/min (ref >=60)
GFR, Est African American: 89 mL/min (ref >=60)
Glucose, Bld: 109 mg/dL — ABNORMAL HIGH (ref 65–99)
POTASSIUM: 4.9 mmol/L (ref 3.5–5.3)
SODIUM: 139 mmol/L (ref 135–146)

## 2015-12-09 LAB — HEPATIC FUNCTION PANEL
ALK PHOS: 149 U/L — AB (ref 33–130)
ALT: 18 U/L (ref 6–29)
AST: 22 U/L (ref 10–35)
Albumin: 3.9 g/dL (ref 3.6–5.1)
BILIRUBIN DIRECT: 0.1 mg/dL (ref <=0.2)
BILIRUBIN INDIRECT: 0.2 mg/dL (ref 0.2–1.2)
BILIRUBIN TOTAL: 0.3 mg/dL (ref 0.2–1.2)
Total Protein: 6.8 g/dL (ref 6.1–8.1)

## 2015-12-09 LAB — CBC WITH DIFFERENTIAL/PLATELET
BASOS ABS: 0 {cells}/uL (ref 0–200)
Basophils Relative: 0 %
Eosinophils Absolute: 222 cells/uL (ref 15–500)
Eosinophils Relative: 3 %
HEMATOCRIT: 38.5 % (ref 35.0–45.0)
HEMOGLOBIN: 12.5 g/dL (ref 11.7–15.5)
LYMPHS ABS: 1998 {cells}/uL (ref 850–3900)
Lymphocytes Relative: 27 %
MCH: 27.4 pg (ref 27.0–33.0)
MCHC: 32.5 g/dL (ref 32.0–36.0)
MCV: 84.4 fL (ref 80.0–100.0)
MONO ABS: 518 {cells}/uL (ref 200–950)
MPV: 9.9 fL (ref 7.5–12.5)
Monocytes Relative: 7 %
NEUTROS PCT: 63 %
Neutro Abs: 4662 cells/uL (ref 1500–7800)
Platelets: 267 10*3/uL (ref 140–400)
RBC: 4.56 MIL/uL (ref 3.80–5.10)
RDW: 15.8 % — ABNORMAL HIGH (ref 11.0–15.0)
WBC: 7.4 10*3/uL (ref 3.8–10.8)

## 2015-12-09 LAB — LIPID PANEL
CHOL/HDL RATIO: 2.7 ratio (ref <=5.0)
CHOLESTEROL: 161 mg/dL (ref 125–200)
HDL: 59 mg/dL (ref >=46)
LDL Cholesterol: 76 mg/dL (ref <130)
Triglycerides: 128 mg/dL (ref <150)
VLDL: 26 mg/dL (ref <30)

## 2015-12-09 LAB — TSH: TSH: 3.75 m[IU]/L

## 2015-12-09 LAB — MAGNESIUM: Magnesium: 1.9 mg/dL (ref 1.5–2.5)

## 2015-12-09 NOTE — Progress Notes (Signed)
[facility] [name], M.D.        Uvaldo Bristle. Silverio Lay, P.A.-C       Starlyn Skeans, P.A.-C  [facility]                [address] [place], [place] [id number] Telephone [phone] Telefax [phone] ______________________________________________________________________     This very nice 65 y.o. DWF presents for 6 month follow up with Hypertension, Hyperlipidemia, T2_NIDDM and Vitamin D Deficiency.      Patient is treated for HTN circa 2007  & BP has been controlled at home. Today's BP is  110/82. Patient has had no complaints of any cardiac type chest pain, palpitations, dyspnea/orthopnea/PND, dizziness, claudication, or dependent edema.     Hyperlipidemia is controlled with diet & meds. Patient denies myalgias or other med SE's. Last Lipids were at goal: Lab Results  Component Value Date   CHOL 151 09/05/2015   HDL 59 09/05/2015   LDLCALC 69 09/05/2015   TRIG 115 09/05/2015   CHOLHDL 2.6 09/05/2015      Also, the patient has history of Morbid Obesity (BMI 42+) and consequent  T2_NIDDM in 2008 and has had no symptoms of reactive hypoglycemia, diabetic polys, paresthesias or visual blurring. Patient also relates she is not monitoring CBG's as recommended.  Last A1c was not at goal: Lab Results  Component Value Date   HGBA1C 6.1 (H) 09/05/2015      Further, the patient also has history of Vitamin D Deficiency in 2008 with Vit D "13"  and supplements vitamin D without any suspected side-effects. Last vitamin D was at goal:    Lab Results  Component Value Date   VD25OH 95 09/05/2015   Current Outpatient Prescriptions on File Prior to Visit  Medication Sig  . ACCU-CHEK AVIVA PLUS test strip USE AS DIRECTED  . albuterol  HFA inhaler Inhale 2 puffs into the lungs every 6 (six) hours as needed for wheezing.  Marland Kitchen ALPRAZolam  0.25 MG tablet Take 1/2 to 1 tablet 2 x / day if needed for anxiety.   Marland Kitchen aspirin 81 MG tablet Take 81 mg by mouth daily.  . baclofen (LIORESAL) 10 MG tablet TAKE 1 TABLET(10 MG) BY MOUTH TWICE DAILY  . Cyanocobalamin (VITAMIN B-12 PO) Take 100 mg by mouth daily.   . cyclobenzaprine (FLEXERIL) 10 MG tablet Take 1/2 to 1 tablet by  mouth 3 times daily for  muscle spasms  . fexofenadine (ALLEGRA) 180 MG tablet Take 180 mg by mouth daily.  . fluticasone (FLONASE) 50 MCG/ACT nasal spray Place 2 sprays into the nose daily.  Marland Kitchen guaiFENesin (MUCINEX) 600 MG 12 hr tablet Take 600 mg by mouth 2 (two) times daily.  . hyoscyamine (LEVBID) 0.375 MG 12 hr tablet TAKE 1 TABLET BY MOUTH TWICE DAILY (Patient taking differently: daily)  . Magnesium 250 MG TABS Take 1 tablet by mouth daily.  Marland Kitchen MELATONIN PO Take 5 mg by mouth at bedtime.   . meloxicam (MOBIC) 15 MG tablet TAKE 1 TAB EVERY DAY   . metFORMIN-XR 500 MG  TAKE 1 TABLET BY MOUTH AT BREAKFAST AND LUNCH AND 2 BY MOUTH AT SUPPER  . metoCLOPramide 10 MG tablet TAKE 1 TAB FOUR TIMES DAILY B  . montelukast (SINGULAIR) 10 MG tablet TAKE 1 TAB EVERY DAY  . nystatin cream  Apply 1 application topically 2 (two) times daily.   Marland Kitchen  olmesartan-hctz 40-12.5  Take 1 tablet by mouth daily.  . pantoprazole  40 MG tablet TAKE 1 TABLET BY MOUTH  DAILY   . PROBIOTIC Take by mouth daily.  . simvastatin  40 MG tablet Take 1 tablet by mouth  every evening  . venlafaxine-XR 150 MG 24 hr capsule TK ONE C PO BID  . zaleplon (SONATA) 10 MG capsule Take 10 mg by mouth at bedtime as needed.  . Iron 325 mg  daily  . Vitamin C with cranberry  1 daily.    Allergies  Allergen Reactions  . Penicillins Rash and Anaphylaxis  . Sulfa Antibiotics Shortness Of Breath    Also light headed, rash  . Celebrex [Celecoxib]     Elevates Blood Pressure   . Ciprofloxacin Nausea And Vomiting  . Codeine Nausea And Vomiting    And hydrocodone  . Levaquin [Levofloxacin] Nausea And Vomiting  . Motrin Ib [Ibuprofen] Hives  . Prempro [Conj Estrog-Medroxyprogest  Ace] Hives   PMHx:   Past Medical History:  Diagnosis Date  . Anxiety   . Bell's palsy 01/1985  . IBS (irritable bowel syndrome) 12/89  . Obesity, Class III, BMI 40-49.9 (morbid obesity) ([place])   . OSA (obstructive sleep apnea)   . Vitamin D deficiency    Immunization History  Administered Date(s) Administered  . DT 05/21/2015  . PPD Test 03/21/2013, 05/21/2015  . Pneumococcal Conjugate-13 01/04/2014  . Pneumococcal Polysaccharide-23 03/09/2004  . Td 03/09/2004  . Tdap 03/09/2005  . Typhoid Inactivated 05/10/2014  . Zoster 12/22/2012   Past Surgical History:  Procedure Laterality Date  . BLADDER SUSPENSION  03/2006, 07/2006, 01/2007   for stress incontinence  . COLONOSCOPY  05/2005   Normal, recheck 10 yrs  . TONSILLECTOMY AND ADENOIDECTOMY    . TOTAL KNEE ARTHROPLASTY Left 2012   x 2 left knee  . TOTAL KNEE REVISION  2014   FHx:    Reviewed / unchanged  SHx:    Reviewed / unchanged  Systems Review:  Constitutional: Denies fever, chills, wt changes, headaches, insomnia, fatigue, night sweats, change in appetite. Eyes: Denies redness, blurred vision, diplopia, discharge, itchy, watery eyes.  ENT: Denies discharge, congestion, post nasal drip, epistaxis, sore throat, earache, hearing loss, dental pain, tinnitus, vertigo, sinus pain, snoring.  CV: Denies chest pain, palpitations, irregular heartbeat, syncope, dyspnea, diaphoresis, orthopnea, PND, claudication or edema. Respiratory: denies cough, dyspnea, DOE, pleurisy, hoarseness, laryngitis, wheezing.  Gastrointestinal: Denies dysphagia, odynophagia, heartburn, reflux, water brash, abdominal pain or cramps, nausea, vomiting, bloating, diarrhea, constipation, hematemesis, melena, hematochezia  or hemorrhoids. Genitourinary: Denies dysuria, frequency, urgency, nocturia, hesitancy, discharge, hematuria or flank pain. Musculoskeletal: Denies arthralgias, myalgias, stiffness, jt. swelling, pain, limping or strain/sprain.  Skin:  Denies pruritus, rash, hives, warts, acne, eczema or change in skin lesion(s). Neuro: No weakness, tremor, incoordination, spasms, paresthesia or pain. Psychiatric: Denies confusion, memory loss or sensory loss. Endo: Denies change in weight, skin or hair change.  Heme/Lymph: No excessive bleeding, bruising or enlarged lymph nodes.  Physical Exam BP 110/82   Pulse 76   Temp 97.4 F (36.3 C)   Resp 16   Ht 5\' 5"  (1.651 m)   Wt 257 lb 9.6 oz (116.8 kg)   LMP 04/09/2009   BMI 42.87 kg/m   Appears over nourished, Morbidly Obese and in no distress.  Eyes: PERRLA, EOMs, conjunctiva no swelling or erythema. Sinuses: No frontal/maxillary tenderness ENT/Mouth: EAC's clear, TM's nl w/o erythema, bulging. Nares clear w/o erythema, swelling, exudates. Oropharynx clear without erythema  or exudates. Oral hygiene is good. Tongue normal, non obstructing. Hearing intact.  Neck: Supple. Thyroid nl. Car 2+/2+ without bruits, nodes or JVD. Chest: Respirations nl with BS clear & equal w/o rales, rhonchi, wheezing or stridor.  Cor: Heart sounds normal w/ regular rate and rhythm without sig. murmurs, gallops, clicks, or rubs. Peripheral pulses normal and equal  without edema.  Abdomen: Soft & bowel sounds normal. Non-tender w/o guarding, rebound, hernias, masses, or organomegaly.  Lymphatics: Unremarkable.  Musculoskeletal: Full ROM all peripheral extremities, joint stability, 5/5 strength, and normal gait.  Skin: Warm, dry without exposed rashes, lesions or ecchymosis apparent.  Neuro: Cranial nerves intact, reflexes equal bilaterally. Sensory-motor testing grossly intact. Tendon reflexes grossly intact.  Pysch: Alert & oriented x 3.  Insight and judgement nl & appropriate. No ideations.  Assessment and Plan:   1. Hypertension  - Continue medication, monitor blood pressure at home. Continue DASH diet. Reminder to go to the ER if any CP, SOB, nausea, dizziness, severe HA, changes vision/speech, left  arm numbness and tingling and jaw pain. - TSH  2. Hyperlipidemia  - Continue diet/meds, exercise,& lifestyle modifications. Continue monitor periodic cholesterol/liver & renal functions  - Lipid panel - TSH  3. 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 Monitor appropriate labs. - Hemoglobin A1c - Insulin, random  4. Vitamin D deficiency  - Continue supplementation. - VITAMIN D 25 Hydroxy   5. Gastroesophageal reflux disease   6. Medication management  - BASIC METABOLIC PANEL WITH GFR - Hepatic function panel - CBC with Differential/Platelet - Magnesium  7. Need for prophylactic vaccination and inoculation against influenza  - Flu Vaccine QUAD with presevative       Recommended regular exercise, BP monitoring, weight control, and discussed med and SE's. Recommended labs to assess and monitor clinical status. Further disposition pending results of labs. Over 30 minutes of exam, counseling, chart review was performed

## 2015-12-09 NOTE — Patient Instructions (Signed)

## 2015-12-10 LAB — HEMOGLOBIN A1C
HEMOGLOBIN A1C: 5.9 % — AB (ref <5.7)
MEAN PLASMA GLUCOSE: 123 mg/dL

## 2015-12-10 LAB — INSULIN, RANDOM: Insulin: 15.2 u[IU]/mL (ref 2.0–19.6)

## 2015-12-10 LAB — VITAMIN D 25 HYDROXY (VIT D DEFICIENCY, FRACTURES): VIT D 25 HYDROXY: 61 ng/mL (ref 30–100)

## 2015-12-12 ENCOUNTER — Ambulatory Visit (INDEPENDENT_AMBULATORY_CARE_PROVIDER_SITE_OTHER): Payer: 59 | Admitting: Certified Nurse Midwife

## 2015-12-12 ENCOUNTER — Encounter: Payer: Self-pay | Admitting: Certified Nurse Midwife

## 2015-12-12 VITALS — BP 124/72 | HR 72 | Resp 16 | Ht 64.75 in | Wt 257.0 lb

## 2015-12-12 DIAGNOSIS — Z124 Encounter for screening for malignant neoplasm of cervix: Secondary | ICD-10-CM | POA: Diagnosis not present

## 2015-12-12 DIAGNOSIS — Z01419 Encounter for gynecological examination (general) (routine) without abnormal findings: Secondary | ICD-10-CM

## 2015-12-12 DIAGNOSIS — Z0000 Encounter for general adult medical examination without abnormal findings: Secondary | ICD-10-CM | POA: Diagnosis not present

## 2015-12-12 LAB — POCT URINALYSIS DIPSTICK
Bilirubin, UA: NEGATIVE
GLUCOSE UA: NEGATIVE
Ketones, UA: NEGATIVE
NITRITE UA: NEGATIVE
PH UA: 5
PROTEIN UA: NEGATIVE
RBC UA: NEGATIVE
UROBILINOGEN UA: NEGATIVE

## 2015-12-12 NOTE — Patient Instructions (Signed)

## 2015-12-12 NOTE — Progress Notes (Signed)
65 y.o. PO:[id number] Divorced  Caucasian Fe here for annual exam. Menopausal no HRT, denies vaginal bleeding or vaginal dryness. Sees PCP for hypertension, cholesterol/diabetic management every 3 months, sees Psychiatry every 6 months for medication management of anxiety, medication for sleep also. Has limited mobility due balance issues and knee surgery. Walking with cane now. Has seen PT with some change. Frustrated and will not use walker! No other health issues today. Plans some time for self.  Patient's last menstrual period was 04/09/2009.          Sexually active: No.  The current method of family planning is post menopausal status.    Exercising: No.  exercise Smoker:  no  Health Maintenance: Pap:  12-05-13 neg HPV HR neg MMG:  11-28-15 neg per patient Colonoscopy:  2014 polyps f/u 32yrs BMD:   2016 TDaP:  2017 Shingles: 2014 Pneumonia: 2015 Hep C and HIV: unsure Labs: poct urine-wbc 2+ Self breast exam: done monthly   reports that she has been smoking Cigarettes.  She has been smoking about 0.50 packs per day. She has never used smokeless tobacco. She reports that she does not drink alcohol or use drugs.  Past Medical History:  Diagnosis Date  . Anxiety   . Bell's palsy 01/1985  . IBS (irritable bowel syndrome) 12/89  . Obesity, Class III, BMI 40-49.9 (morbid obesity) ([place])   . OSA (obstructive sleep apnea)   . Vitamin D deficiency     Past Surgical History:  Procedure Laterality Date  . BLADDER SUSPENSION  03/2006, 07/2006, 01/2007   for stress incontinence  . COLONOSCOPY  05/2005   Normal, recheck 10 yrs  . TONSILLECTOMY AND ADENOIDECTOMY    . TOTAL KNEE ARTHROPLASTY Left 2012   x 2 left knee  . TOTAL KNEE REVISION  2014    Current Outpatient Prescriptions  Medication Sig Dispense Refill  . ACCU-CHEK AVIVA PLUS test strip USE AS DIRECTED 300 each 6  . albuterol (PROVENTIL HFA;VENTOLIN HFA) 108 (90 BASE) MCG/ACT inhaler Inhale 2 puffs into the lungs every 6 (six) hours  as needed for wheezing.    Marland Kitchen ALPRAZolam (XANAX) 0.25 MG tablet Take 1/2 to 1 tablet 2 x / day if needed for anxiety. 180 tablet 1  . aspirin 81 MG tablet Take 81 mg by mouth daily.    . baclofen (LIORESAL) 10 MG tablet TAKE 1 TABLET(10 MG) BY MOUTH TWICE DAILY 180 tablet 1  . Cyanocobalamin (VITAMIN B-12 PO) Take 100 mg by mouth daily.     . cyclobenzaprine (FLEXERIL) 10 MG tablet Take 1/2 to 1 tablet by  mouth 3 times daily for  muscle spasms 270 tablet 0  . fexofenadine (ALLEGRA) 180 MG tablet Take 180 mg by mouth daily.    . fluticasone (FLONASE) 50 MCG/ACT nasal spray Place 2 sprays into the nose daily.    Marland Kitchen guaiFENesin (MUCINEX) 600 MG 12 hr tablet Take 600 mg by mouth 2 (two) times daily.    . hyoscyamine (LEVBID) 0.375 MG 12 hr tablet TAKE 1 TABLET BY MOUTH TWICE DAILY (Patient taking differently: daily) 180 tablet 98  . Magnesium 250 MG TABS Take 1 tablet by mouth daily.    Marland Kitchen MELATONIN PO Take 5 mg by mouth at bedtime.     . meloxicam (MOBIC) 15 MG tablet TAKE 1 TABLET BY MOUTH EVERY DAY AFTER A MEAL AS NEEDED FOR PAIN AND INFLAMMATION 90 tablet 0  . metFORMIN (GLUCOPHAGE-XR) 500 MG 24 hr tablet TAKE 1 TABLET BY MOUTH AT  BREAKFAST AND LUNCH AND 2 BY MOUTH AT SUPPER 360 tablet 1  . metoCLOPramide (REGLAN) 10 MG tablet TAKE 1 TABLET BY MOUTH FOUR TIMES DAILY BEFORE A MEAL AND EVERY NIGHT AT BEDTIME FOR ACID REFLUX 360 tablet 99  . montelukast (SINGULAIR) 10 MG tablet TAKE 1 TABLET BY MOUTH EVERY DAY 90 tablet 3  . nystatin cream (MYCOSTATIN) Apply 1 application topically 2 (two) times daily. Apply to affected area BID for up to 7 days. 30 g 0  . olmesartan-hydrochlorothiazide (BENICAR HCT) 40-12.5 MG tablet Take 1 tablet by mouth daily. 90 tablet 1  . OVER THE COUNTER MEDICATION Iron 325 mg daily    . OVER THE COUNTER MEDICATION Vitamin C with cranberry 1 daily.    . pantoprazole (PROTONIX) 40 MG tablet TAKE 1 TABLET BY MOUTH  DAILY FOR ACID INDIGESTION 90 tablet 2  . Probiotic Product  (PROBIOTIC PO) Take by mouth daily.    . simvastatin (ZOCOR) 40 MG tablet Take 1 tablet by mouth  every evening 90 tablet 1  . venlafaxine XR (EFFEXOR-XR) 150 MG 24 hr capsule TK ONE C PO BID  4  . zaleplon (SONATA) 10 MG capsule Take 10 mg by mouth at bedtime as needed.     No current facility-administered medications for this visit.     Family History  Problem Relation Age of Onset  . Hypertension Father   . Diabetes Father   . Parkinson's disease Father   . CAD Paternal Grandfather 66    Died MI  . Dementia Mother     ROS:  Pertinent items are noted in HPI.  Otherwise, a comprehensive ROS was negative.  Exam:   LMP 04/09/2009    Ht Readings from Last 3 Encounters:  12/09/15 5\' 5"  (1.651 m)  09/05/15 5\' 5"  (1.651 m)  05/21/15 5\' 5"  (1.651 m)    General appearance: alert, cooperative and appears stated age Head: Normocephalic, without obvious abnormality, atraumatic Neck: no adenopathy, supple, symmetrical, trachea midline and thyroid normal to inspection and palpation Lungs: clear to auscultation bilaterally Breasts: normal appearance, no masses or tenderness, No nipple retraction or dimpling, No nipple discharge or bleeding, No axillary or supraclavicular adenopathy Heart: regular rate and rhythm Abdomen: soft, non-tender; no masses,  no organomegaly Extremities: extremities normal, atraumatic, no cyanosis or edema Skin: Skin color, texture, turgor normal. No rashes or lesions Lymph nodes: Cervical, supraclavicular, and axillary nodes normal. No abnormal inguinal nodes palpated Neurologic: Grossly normal   Pelvic: External genitalia:  no lesions              Urethra:  normal appearing urethra with no masses, tenderness or lesions              Bartholin's and Skene's: normal                 Vagina: normal appearing vagina with normal color and discharge, no lesions              Cervix: no cervical motion tenderness, no lesions and normal appearance              Pap  taken: Yes.   Bimanual Exam:  Uterus:  normal size, contour, position, consistency, mobility, non-tender              Adnexa: normal adnexa and no mass, fullness, tenderness               Rectovaginal: Confirms  Anus:  normal sphincter tone, no lesions  Chaperone present: yes  A:  Well Woman with normal exam  Menopausal no HRT  Hypertension/cholesterol/diabetic management  Psychiatrist management of anxiety  Limited  Mobility with balance, under PT    P:   Reviewed health and wellness pertinent to exam  Aware of need to evaluate if vaginal bleeding  Continue with MD as indicated  Pap smear as above with HPV reflex    counseled on breast self exam, mammography screening, adequate intake of calcium and vitamin D, diet and exercise, Kegel's exercises  return annually or prn  An After Visit Summary was printed and given to the patient.

## 2015-12-14 LAB — IPS PAP TEST WITH REFLEX TO HPV

## 2015-12-15 NOTE — Progress Notes (Signed)
Encounter reviewed Tyeler Goedken, MD   

## 2016-01-19 ENCOUNTER — Other Ambulatory Visit: Payer: Self-pay | Admitting: Internal Medicine

## 2016-02-13 ENCOUNTER — Other Ambulatory Visit: Payer: Self-pay | Admitting: Physician Assistant

## 2016-02-16 ENCOUNTER — Other Ambulatory Visit: Payer: Self-pay | Admitting: Internal Medicine

## 2016-02-21 ENCOUNTER — Other Ambulatory Visit: Payer: Self-pay | Admitting: Internal Medicine

## 2016-03-13 ENCOUNTER — Encounter: Payer: Self-pay | Admitting: *Deleted

## 2016-03-13 ENCOUNTER — Other Ambulatory Visit: Payer: Self-pay | Admitting: Physician Assistant

## 2016-03-17 ENCOUNTER — Ambulatory Visit (INDEPENDENT_AMBULATORY_CARE_PROVIDER_SITE_OTHER): Payer: 59 | Admitting: Physician Assistant

## 2016-03-17 ENCOUNTER — Encounter: Payer: Self-pay | Admitting: Physician Assistant

## 2016-03-17 VITALS — BP 118/82 | HR 100 | Temp 97.3°F | Resp 14 | Ht 64.75 in | Wt 258.0 lb

## 2016-03-17 DIAGNOSIS — E1122 Type 2 diabetes mellitus with diabetic chronic kidney disease: Secondary | ICD-10-CM | POA: Diagnosis not present

## 2016-03-17 DIAGNOSIS — E559 Vitamin D deficiency, unspecified: Secondary | ICD-10-CM | POA: Diagnosis not present

## 2016-03-17 DIAGNOSIS — J449 Chronic obstructive pulmonary disease, unspecified: Secondary | ICD-10-CM | POA: Diagnosis not present

## 2016-03-17 DIAGNOSIS — E66813 Obesity, class 3: Secondary | ICD-10-CM

## 2016-03-17 DIAGNOSIS — F325 Major depressive disorder, single episode, in full remission: Secondary | ICD-10-CM | POA: Diagnosis not present

## 2016-03-17 DIAGNOSIS — I10 Essential (primary) hypertension: Secondary | ICD-10-CM

## 2016-03-17 DIAGNOSIS — Z79899 Other long term (current) drug therapy: Secondary | ICD-10-CM | POA: Diagnosis not present

## 2016-03-17 DIAGNOSIS — F17210 Nicotine dependence, cigarettes, uncomplicated: Secondary | ICD-10-CM

## 2016-03-17 DIAGNOSIS — M5441 Lumbago with sciatica, right side: Secondary | ICD-10-CM | POA: Diagnosis not present

## 2016-03-17 DIAGNOSIS — E782 Mixed hyperlipidemia: Secondary | ICD-10-CM | POA: Diagnosis not present

## 2016-03-17 DIAGNOSIS — G8929 Other chronic pain: Secondary | ICD-10-CM | POA: Diagnosis not present

## 2016-03-17 LAB — CBC WITH DIFFERENTIAL/PLATELET
BASOS ABS: 0 {cells}/uL (ref 0–200)
Basophils Relative: 0 %
EOS ABS: 280 {cells}/uL (ref 15–500)
EOS PCT: 4 %
HEMATOCRIT: 38.3 % (ref 35.0–45.0)
HEMOGLOBIN: 12.3 g/dL (ref 11.7–15.5)
LYMPHS ABS: 1960 {cells}/uL (ref 850–3900)
Lymphocytes Relative: 28 %
MCH: 27.2 pg (ref 27.0–33.0)
MCHC: 32.1 g/dL (ref 32.0–36.0)
MCV: 84.7 fL (ref 80.0–100.0)
MPV: 9.8 fL (ref 7.5–12.5)
Monocytes Absolute: 630 cells/uL (ref 200–950)
Monocytes Relative: 9 %
NEUTROS PCT: 59 %
Neutro Abs: 4130 cells/uL (ref 1500–7800)
Platelets: 263 10*3/uL (ref 140–400)
RBC: 4.52 MIL/uL (ref 3.80–5.10)
RDW: 16 % — ABNORMAL HIGH (ref 11.0–15.0)
WBC: 7 10*3/uL (ref 3.8–10.8)

## 2016-03-17 NOTE — Patient Instructions (Addendum)
Simple math prevails.    1st - exercise does not produce significant weight loss - at best one converts fat into muscle , "bulks up", loses inches, but usually stays "weight neutral"     2nd - think of your body weightas a check book: If you eat more calories than you burn up - you save money or gain weight .... Or if you spend more money than you put in the check book, ie burn up more calories than you eat, then you lose weight     3rd - if you walk or run 1 mile, you burn up 100 calories - you have to burn up 3,500 calories to lose 1 pound, ie you have to walk/run 35 miles to lose 1 measly pound. So if you want to lose 10 #, then you have to walk/run 350 miles, so.... clearly exercise is not the solution.     4. So if you consume 1,500 calories, then you have to burn up the equivalent of 15 miles to stay weight neutral - It also stands to reason that if you consume 1,500 cal/day and don't lose weight, then you must be burning up about 1,500 cals/day to stay weight neutral.     5. If you really want to lose weight, you must cut your calorie intake 300 calories /day and at that rate you should lose about 1 # every 3 days.   6. Please purchase Dr Joel Fuhrman's book(s) "The End of Dieting" & "Eat to Live" . It has some great concepts and recipes.     If you have a smart phone, please look up Smoke Free app, this will help you stay on track and give you information about money you have saved, life that you have gained back and a ton of more information.   We are giving you chantix for smoking cessation. You can do it! And we are here to help! You may have heard some scary side effects about chantix, the three most common I hear about are nausea, crazy dreams and depression.  However, I like for my patients to try to stay on 1/2 a tablet twice a day rather than one tablet twice a day as normally prescribed. This helps decrease the chances of side effects and helps save money by making a one month  prescription last two months  Please start the prescription this way:  Start 1/2 tablet by mouth once daily after food with a full glass of water for 3 days Then do 1/2 tablet by mouth twice daily for 4 days. During this first week you can smoke, but try to stop after this week.  At this point we have several options: 1) continue on 1/2 tablet twice a day- which I encourage you to do. You can stay on this dose the rest of the time on the medication or if you still feel the need to smoke you can do one of the two options below. 2) do one tablet in the morning and 1/2 in the evening which helps decrease dreams. 3) do one tablet twice a day.   What if I miss a dose? If you miss a dose, take it as soon as you can. If it is almost time for your next dose, take only that dose. Do not take double or extra doses.  What should I watch for while using this medicine? Visit your doctor or health care professional for regular check ups. Ask for ongoing advice and encouragement from   your doctor or healthcare professional, friends, and family to help you quit. If you smoke while on this medication, quit again  Your mouth may get dry. Chewing sugarless gum or hard candy, and drinking plenty of water may help. Contact your doctor if the problem does not go away or is severe.  You may get drowsy or dizzy. Do not drive, use machinery, or do anything that needs mental alertness until you know how this medicine affects you. Do not stand or sit up quickly, especially if you are an older patient.   The use of this medicine may increase the chance of suicidal thoughts or actions. Pay special attention to how you are responding while on this medicine. Any worsening of mood, or thoughts of suicide or dying should be reported to your health care professional right away.  ADVANTAGES OF QUITTING SMOKING  Within 20 minutes, blood pressure decreases. Your pulse is at normal level.  After 8 hours, carbon monoxide levels  in the blood return to normal. Your oxygen level increases.  After 24 hours, the chance of having a heart attack starts to decrease. Your breath, hair, and body stop smelling like smoke.  After 48 hours, damaged nerve endings begin to recover. Your sense of taste and smell improve.  After 72 hours, the body is virtually free of nicotine. Your bronchial tubes relax and breathing becomes easier.  After 2 to 12 weeks, lungs can hold more air. Exercise becomes easier and circulation improves.  After 1 year, the risk of coronary heart disease is cut in half.  After 5 years, the risk of stroke falls to the same as a nonsmoker.  After 10 years, the risk of lung cancer is cut in half and the risk of other cancers decreases significantly.  After 15 years, the risk of coronary heart disease drops, usually to the level of a nonsmoker.  You will have extra money to spend on things other than cigarettes.  

## 2016-03-17 NOTE — Progress Notes (Signed)
Assessment and Plan:   Hypertension - continue medications, DASH diet, exercise and monitor at home. Call if greater than 130/80.  -     CBC with Differential/Platelet -     BASIC METABOLIC PANEL WITH GFR -     Hepatic function panel -     TSH  Chronic obstructive pulmonary disease, unspecified COPD type ([place]) Advised to stop smoking, will get CXR, continue meds.   Controlled type 2 diabetes mellitus with chronic kidney disease, without long-term current use of insulin, unspecified CKD stage ([place]) Discussed general issues about diabetes pathophysiology and management., Educational material distributed., Suggested low cholesterol diet., Encouraged aerobic exercise., Discussed foot care., Reminded to get yearly retinal exam. -     Hemoglobin A1c  Depression, major, in remission ([place]) Patient has been stable on meds for years, her psych is leaving, will follow up here and will refill meds PRN Take 0.25mg  of xanax BID, and effexor  Hyperlipidemia -continue medications, check lipids, decrease fatty foods, increase activity.  -     Lipid panel  Obesity, Class III, BMI 40-49.9 (morbid obesity) ([place]) - long discussion about weight loss, diet, and exercise  Medication management -     Magnesium  Vitamin D deficiency -     VITAMIN D 25 Hydroxy (Vit-D Deficiency, Fractures)  Chronic right-sided low back pain with right-sided sciatica -     Ambulatory referral to Orthopedic Surgery - refer to [facility] or [facility] for back/leg weakness/imbalance - negative straight leg raise, no red flag symptoms    Continue diet and meds as discussed. Further disposition pending results of labs. Discussed med's effects and SE's.    HPI 66 y.o. female  presents for 3 month follow up with hypertension, hyperlipidemia, diabetes and vitamin D. Her blood pressure has been controlled at home, today their BP is BP: 118/82 She does not workout. She denies chest pain, shortness of breath, dizziness.   She is on cholesterol medication and denies myalgias. Her cholesterol is at goal. The cholesterol last visit was:   Lab Results  Component Value Date   CHOL 161 12/09/2015   HDL 59 12/09/2015   LDLCALC 76 12/09/2015   TRIG 128 12/09/2015   CHOLHDL 2.7 12/09/2015   She has been working on diet and exercise for Diabetes, she is on bASA, ARB,  metformin and has done a good job with weight loss and bringing down her sugars, and denies polydipsia and polyuria. Last A1C in the office was:  Lab Results  Component Value Date   HGBA1C 5.9 (H) 12/09/2015   Patient is on Vitamin D supplement. Lab Results  Component Value Date   VD25OH 83 12/09/2015   Follows with psych for xanax and effexor, however her psych is moving and she is requesting that we take over the prescription.  She is smoking still but she has been cutting back.  BMI is Body mass index is 43.27 kg/m., she is working on diet and exercise. She has been going to chiropractor x Dec for right hip, has had lower back pain and right buttocks/lateral hip pain, with radiation into her front thigh to her knee x 2 months, has had bilateral leg weakness x months/years, states since her knee surgery in 2014 but progressively worse over past year, walks with a cane. Has had 3 falls in past year due to imbalance, worse with walking for a long time. Denies numbness/tingling in her legs. No urinary incontinence. Wt Readings from Last 3 Encounters:  03/17/16 258 lb (  117 kg)  12/12/15 257 lb (116.6 kg)  12/09/15 257 lb 9.6 oz (116.8 kg)    Current Medications:  Current Outpatient Prescriptions on File Prior to Visit  Medication Sig Dispense Refill  . ACCU-CHEK AVIVA PLUS test strip USE AS DIRECTED 300 each 6  . albuterol (PROVENTIL HFA;VENTOLIN HFA) 108 (90 BASE) MCG/ACT inhaler Inhale 2 puffs into the lungs every 6 (six) hours as needed for wheezing.    Marland Kitchen ALPRAZolam (XANAX) 0.25 MG tablet Take 1/2 to 1 tablet 2 x / day if needed for anxiety.  180 tablet 1  . aspirin 81 MG tablet Take 81 mg by mouth daily.    . baclofen (LIORESAL) 10 MG tablet TAKE 1 TABLET(10 MG) BY MOUTH TWICE DAILY 180 tablet 1  . Cyanocobalamin (VITAMIN B-12 PO) Take 100 mg by mouth daily.     . cyclobenzaprine (FLEXERIL) 10 MG tablet Take 1/2 to 1 tablet by  mouth 3 times daily for  muscle spasms 270 tablet 0  . fexofenadine (ALLEGRA) 180 MG tablet Take 180 mg by mouth daily.    . fluticasone (FLONASE) 50 MCG/ACT nasal spray Place 2 sprays into the nose daily.    Marland Kitchen guaiFENesin (MUCINEX) 600 MG 12 hr tablet Take 600 mg by mouth 2 (two) times daily.    . hyoscyamine (LEVBID) 0.375 MG 12 hr tablet TAKE 1 TABLET BY MOUTH TWICE DAILY (Patient taking differently: daily) 180 tablet 98  . Magnesium 250 MG TABS Take 1 tablet by mouth daily.    Marland Kitchen MELATONIN PO Take 5 mg by mouth at bedtime.     . meloxicam (MOBIC) 15 MG tablet TAKE 1 TABLET BY MOUTH EVERY DAY AFTER A MEAL AS NEEDED FOR PAIN AND INFLAMMATION 90 tablet 1  . metFORMIN (GLUCOPHAGE-XR) 500 MG 24 hr tablet TAKE 1 TABLET BY MOUTH AT BREAKFAST AND LUNCH AND 2 BY MOUTH AT SUPPER 360 tablet 1  . metoCLOPramide (REGLAN) 10 MG tablet TAKE 1 TABLET BY MOUTH FOUR TIMES DAILY BEFORE A MEAL AND EVERY NIGHT AT BEDTIME FOR ACID REFLUX 360 tablet 99  . montelukast (SINGULAIR) 10 MG tablet TAKE 1 TABLET BY MOUTH EVERY DAY 90 tablet 1  . nystatin cream (MYCOSTATIN) Apply 1 application topically 2 (two) times daily. Apply to affected area BID for up to 7 days. 30 g 0  . olmesartan-hydrochlorothiazide (BENICAR HCT) 40-12.5 MG tablet TAKE 1 TABLET BY MOUTH ONCE DAILY 90 tablet 1  . OVER THE COUNTER MEDICATION Iron 325 mg daily    . OVER THE COUNTER MEDICATION Vitamin C with cranberry 1 daily.    . pantoprazole (PROTONIX) 40 MG tablet TAKE 1 TABLET BY MOUTH  DAILY FOR ACID INDIGESTION 90 tablet 2  . Probiotic Product (PROBIOTIC PO) Take by mouth daily.    . simvastatin (ZOCOR) 40 MG tablet TAKE 1 TABLET BY MOUTH  EVERY EVENING 90  tablet 1  . venlafaxine XR (EFFEXOR-XR) 150 MG 24 hr capsule TK ONE C PO BID  4  . zaleplon (SONATA) 10 MG capsule Take 10 mg by mouth at bedtime as needed.     No current facility-administered medications on file prior to visit.    Medical History:  Past Medical History:  Diagnosis Date  . Anxiety   . Bell's palsy 01/1985  . IBS (irritable bowel syndrome) 12/89  . Obesity, Class III, BMI 40-49.9 (morbid obesity) ([place])   . OSA (obstructive sleep apnea)   . Vitamin D deficiency    Allergies:  Allergies  Allergen Reactions  .  Penicillins Rash and Anaphylaxis  . Sulfa Antibiotics Shortness Of Breath    Also light headed, rash  . Celebrex [Celecoxib]     Elevates Blood Pressure   . Ciprofloxacin Nausea And Vomiting  . Codeine Nausea And Vomiting    And hydrocodone  . Levaquin [Levofloxacin] Nausea And Vomiting  . Motrin Ib [Ibuprofen] Hives  . Prempro [Conj Estrog-Medroxyprogest Ace] Hives    Review of Systems  Constitutional: Negative.  Negative for chills, diaphoresis and fever.  HENT: Negative.   Respiratory: Negative.  Negative for cough and shortness of breath.   Cardiovascular: Negative.   Gastrointestinal: Negative.  Negative for abdominal pain, blood in stool, constipation, diarrhea, nausea and vomiting.  Genitourinary: Negative.  Negative for dysuria, frequency, hematuria and urgency.  Musculoskeletal: Positive for back pain and myalgias.  Neurological: Negative for dizziness.  Psychiatric/Behavioral: Negative.      Family history- Review and unchanged Social history- Review and unchanged Physical Exam: BP 118/82   Pulse 100   Temp 97.3 F (36.3 C)   Resp 14   Ht 5' 4.75" (1.645 m)   Wt 258 lb (117 kg)   LMP 04/09/2009   SpO2 97%   BMI 43.27 kg/m  Wt Readings from Last 3 Encounters:  03/17/16 258 lb (117 kg)  12/12/15 257 lb (116.6 kg)  12/09/15 257 lb 9.6 oz (116.8 kg)   General Appearance: Well nourished, in no apparent distress. Eyes: PERRLA,  EOMs, conjunctiva no swelling or erythema Sinuses: + Frontal/maxillary tenderness ENT/Mouth: Ext aud canals clear, TMs without erythema, bulging. No erythema, swelling, or exudate on post pharynx.  Tonsils not swollen or erythematous. Hearing normal.  Neck: Supple, thyroid normal.  Respiratory: Respiratory effort normal, diffuse decreased breath sounds without rales, wheezing, rhonchi,  or stridor.  Cardio: RRR with no MRGs. Brisk peripheral pulses with mild bilateral edema.  Abdomen: Soft, + BS, obese  Non tender, no guarding, rebound, hernias, masses. Lymphatics: Non tender without lymphadenopathy.  Musculoskeletal: Full ROM, 4/5 strength, slight decrease in strength with flexion of right leg otherwise normal, no foot drop, normal reflexes, normal 2 point discrimination bilateral legs, good distal pulses/cap refill, negative straight leg raise, + tenderness right SI and right greater trochanter, antalgic gait walking with cane Skin: Warm, dry without rashes, lesions, ecchymosis.  Neuro: Cranial nerves intact. No cerebellar symptoms. Sensation intact.  Psych: Awake and oriented X 3, normal affect, Insight and Judgment appropriate.    Vicie Mutters, PA-C 2:40 PM [facility]

## 2016-03-18 ENCOUNTER — Encounter: Payer: Self-pay | Admitting: Physician Assistant

## 2016-03-18 LAB — VITAMIN D 25 HYDROXY (VIT D DEFICIENCY, FRACTURES): Vit D, 25-Hydroxy: 61 ng/mL (ref 30–100)

## 2016-03-18 LAB — BASIC METABOLIC PANEL WITH GFR
BUN: 14 mg/dL (ref 7–25)
CALCIUM: 9.1 mg/dL (ref 8.6–10.4)
CHLORIDE: 103 mmol/L (ref 98–110)
CO2: 24 mmol/L (ref 20–31)
CREATININE: 0.89 mg/dL (ref 0.50–0.99)
GFR, Est African American: 80 mL/min (ref >=60)
GFR, Est Non African American: 69 mL/min (ref >=60)
GLUCOSE: 94 mg/dL (ref 65–99)
Potassium: 4.5 mmol/L (ref 3.5–5.3)
SODIUM: 140 mmol/L (ref 135–146)

## 2016-03-18 LAB — HEPATIC FUNCTION PANEL
ALT: 12 U/L (ref 6–29)
AST: 18 U/L (ref 10–35)
Albumin: 3.8 g/dL (ref 3.6–5.1)
Alkaline Phosphatase: 115 U/L (ref 33–130)
Bilirubin, Direct: 0.1 mg/dL (ref <=0.2)
Indirect Bilirubin: 0.2 mg/dL (ref 0.2–1.2)
TOTAL PROTEIN: 6.6 g/dL (ref 6.1–8.1)
Total Bilirubin: 0.3 mg/dL (ref 0.2–1.2)

## 2016-03-18 LAB — LIPID PANEL
CHOL/HDL RATIO: 2.4 ratio (ref <5.0)
CHOLESTEROL: 153 mg/dL (ref <200)
HDL: 64 mg/dL (ref >50)
LDL Cholesterol: 68 mg/dL (ref <100)
Triglycerides: 106 mg/dL (ref <150)
VLDL: 21 mg/dL (ref <30)

## 2016-03-18 LAB — MAGNESIUM: MAGNESIUM: 1.9 mg/dL (ref 1.5–2.5)

## 2016-03-18 LAB — HEMOGLOBIN A1C
Hgb A1c MFr Bld: 5.9 % — ABNORMAL HIGH (ref <5.7)
MEAN PLASMA GLUCOSE: 123 mg/dL

## 2016-03-18 LAB — TSH: TSH: 1.66 m[IU]/L

## 2016-03-24 ENCOUNTER — Encounter: Payer: Self-pay | Admitting: Physician Assistant

## 2016-04-03 ENCOUNTER — Other Ambulatory Visit: Payer: Self-pay | Admitting: Internal Medicine

## 2016-04-03 MED ORDER — CYCLOBENZAPRINE HCL 10 MG PO TABS: ORAL_TABLET | ORAL | 1 refills | Status: DC

## 2016-04-08 ENCOUNTER — Other Ambulatory Visit: Payer: Self-pay | Admitting: Orthopedic Surgery

## 2016-04-08 DIAGNOSIS — M5416 Radiculopathy, lumbar region: Secondary | ICD-10-CM

## 2016-04-16 ENCOUNTER — Ambulatory Visit
Admission: RE | Admit: 2016-04-16 | Discharge: 2016-04-16 | Disposition: A | Discharge status: Home / Self Care | Payer: 59 | Source: Ambulatory Visit | Attending: Orthopedic Surgery | Admitting: Orthopedic Surgery

## 2016-04-16 DIAGNOSIS — M5416 Radiculopathy, lumbar region: Secondary | ICD-10-CM

## 2016-05-01 ENCOUNTER — Other Ambulatory Visit: Payer: Self-pay | Admitting: Physician Assistant

## 2016-05-17 ENCOUNTER — Other Ambulatory Visit: Payer: Self-pay | Admitting: Internal Medicine

## 2016-05-21 ENCOUNTER — Encounter: Payer: Self-pay | Admitting: Internal Medicine

## 2016-05-21 ENCOUNTER — Other Ambulatory Visit: Payer: Self-pay | Admitting: Internal Medicine

## 2016-06-28 ENCOUNTER — Other Ambulatory Visit: Payer: Self-pay | Admitting: Internal Medicine

## 2016-07-07 ENCOUNTER — Encounter: Payer: Self-pay | Admitting: Internal Medicine

## 2016-07-12 NOTE — Progress Notes (Signed)
[facility] Beth [name], M.D.      Uvaldo Bristle. Silverio Lay, P.A.-C [facility]                [address] [place], [place] [address] Telephone [phone] Telefax [phone]  Annual Screening/Preventative Visit & Comprehensive Evaluation &  Examination     This very nice 66 y.o. DWF presents for a Screening/Preventative Visit & comprehensive evaluation and management of multiple medical co-morbidities.  Patient has been followed for HTN, T2_NIDDM, Hyperlipidemia and Vitamin D Deficiency.     Patient has Chronic Pain Syndrome consequent  Of Lumbar DDD and had been followed 20+/- years by Dr Elta Guadeloupe Phillips/Pain Mgmt and more recently has been seen by Dr Micheline Rough patient has received  EDSI's x 3  by Dr Mina Marble for minimal relief.       HTN predates since 2007. Patient's BP has been controlled at home and patient denies any cardiac symptoms as chest pain, palpitations, shortness of breath, dizziness or ankle swelling. Today's BP is at goal - 126/78.      Patient's hyperlipidemia is controlled with diet and medications. Patient denies myalgias or other medication SE's. Last lipids were at goal: Lab Results  Component Value Date   CHOL 153 03/17/2016   HDL 64 03/17/2016   LDLCALC 68 03/17/2016   TRIG 106 03/17/2016   CHOLHDL 2.4 03/17/2016      Patient has of Morbid Obesity (BMI 42+) and T2_NIDDM predating since 2008 and patient denies reactive hypoglycemic symptoms, visual blurring, diabetic polys, or paresthesias. Cbg's are not monitored. Last A1c was near goal: Lab Results  Component Value Date   HGBA1C 5.9 (H) 03/17/2016      Finally, patient has history  Vitamin D Deficiency ("13" in 2008)  and last Vitamin D was at goal: Lab Results  Component Value Date   VD25OH 61 03/17/2016   Current Outpatient Prescriptions on File Prior to Visit  Medication Sig  . ALPRAZolam  0.25 MG tablet Take 1/2 to 1  tablet 2 x / day if needed for anxiety.  Marland Kitchen aspirin 81 MG tablet Take 81 mg by mouth daily.  . baclofen10 MG tablet TAKE 1 TABLET BY MOUTH TWICE DAILY  . VITAMIN B-12 tab Take 100 mg by mouth daily.   . cyclobenzaprine  10 MG tablet Take 1/2 to 1 tablet 3 x / day only if needed for muscle spasm  . fexofenadine 180 MG tablet Take 180 mg by mouth daily.  Marland Kitchen FLONASE nasal spray Place 2 sprays into the nose daily.  Marland Kitchen guaiFENesin 600 MG 12 hr tablet Take 600 mg by mouth 2 (two) times daily.  Marland Kitchen Hyoscyamine 0.375 MG 12 hr TAKE 1 TABLET BY MOUTH TWICE DAILY  . Magnesium 250 MG TABS Take 1 tablet by mouth daily.  Marland Kitchen MELATONIN PO Take 5 mg by mouth at bedtime.   . Meloxicam 15 MG tablet TAKE 1 TAB\ EVERY DAY AFTER A MEAL AS NEEDED \  . metFORMIN-XR 500 MG TAKE 1 TAB @ BREAKFAST & LUNCH & 2 @SUPPER   . metoCLOPramide (REGLAN) 10 MG tablet TAKE 1 TAB FOUR TIMES DAILY   . montelukast  10 MG tablet TAKE 1 TABLET BY MOUTH EVERY DAY  . nystatin cream  Apply 1 application topically 2 (two) times daily.  Marland Kitchen olmesartan-hctz 40-12.5  TAKE 1 TABLET BY MOUTH ONCE DAILY  . Iron  325 mg  daily  . Vitamin C with cranberry  1 daily.  . pantoprazole  40 MG tablet TAKE 1 TABLET BY MOUTH  DAILY FOR ACID INDIGESTION  . Probiotic  Take by mouth daily.  . simvastatin  40 MG tablet TAKE 1 TABLET BY MOUTH  EVERY EVENING  . venlafaxine-XR 150 MG  TK ONE C PO BID  . zaleplon (SONATA) 10 MG  Take 10 mg by mouth at bedtime as needed.   Allergies  Allergen Reactions  . Penicillins Rash and Anaphylaxis  . Sulfa Antibiotics Shortness Of Breath    Also light headed, rash  . Celebrex [Celecoxib]     Elevates Blood Pressure   . Ciprofloxacin Nausea And Vomiting  . Codeine Nausea And Vomiting    And hydrocodone  . Levaquin [Levofloxacin] Nausea And Vomiting  . Motrin Ib [Ibuprofen] Hives  . Prempro [Conj Estrog-Medroxyprogest Ace] Hives   Past Medical History:  Diagnosis Date  . Anxiety   . Bell's palsy 01/1985  . IBS  (irritable bowel syndrome) 12/89  . Obesity, Class III, BMI 40-49.9 (morbid obesity) ([place])   . OSA (obstructive sleep apnea)   . Vitamin D deficiency    Health Maintenance  Topic Date Due  . FOOT EXAM  05/20/2016  . HEMOGLOBIN A1C  09/14/2016  . INFLUENZA VACCINE  10/07/2016  . OPHTHALMOLOGY EXAM  12/15/2016  . MAMMOGRAM  11/27/2017  . PAP SMEAR  12/12/2018  . COLONOSCOPY  10/13/2022  . TETANUS/TDAP  05/20/2025  . PNEUMOCOCCAL POLYSACCHARIDE VACCINE  Completed  . Hepatitis C Screening  Completed  . HIV Screening  Completed   Immunization History  Administered Date(s) Administered  . DT 05/21/2015  . Influenza,inj,quad, With Preservative 12/09/2015  . PPD Test 03/21/2013, 05/21/2015  . Pneumococcal Conjugate-13 01/04/2014  . Pneumococcal Polysaccharide-23 03/09/2004  . Td 03/09/2004  . Tdap 03/09/2005  . Typhoid Inactivated 05/10/2014  . Zoster 12/22/2012   Past Surgical History:  Procedure Laterality Date  . BLADDER SUSPENSION  03/2006, 07/2006, 01/2007   for stress incontinence  . COLONOSCOPY  05/2005   Normal, recheck 10 yrs  . TONSILLECTOMY AND ADENOIDECTOMY    . TOTAL KNEE ARTHROPLASTY Left 2012   x 2 left knee  . TOTAL KNEE REVISION  2014   Family History  Problem Relation Age of Onset  . Hypertension Father   . Diabetes Father   . Parkinson's disease Father   . CAD Paternal Grandfather 25    Died MI  . Dementia Mother    Social History  Substance Use Topics  . Smoking status: Current Every Day Smoker    Packs/day: 0.50    Types: Cigarettes  . Smokeless tobacco: Never Used  . Alcohol use No    ROS Constitutional: Denies fever, chills, weight loss/gain, headaches, insomnia,  night sweats, and change in appetite. Does c/o fatigue. Eyes: Denies redness, blurred vision, diplopia, discharge, itchy, watery eyes.  ENT: Denies discharge, congestion, post nasal drip, epistaxis, sore throat, earache, hearing loss, dental pain, Tinnitus, Vertigo, Sinus pain,  snoring.  Cardio: Denies chest pain, palpitations, irregular heartbeat, syncope, dyspnea, diaphoresis, orthopnea, PND, claudication, edema Respiratory: denies cough, dyspnea, DOE, pleurisy, hoarseness, laryngitis, wheezing.  Gastrointestinal: Denies dysphagia, heartburn, reflux, water brash, pain, cramps, nausea, vomiting, bloating, diarrhea, constipation, hematemesis, melena, hematochezia, jaundice, hemorrhoids Genitourinary: Denies dysuria, frequency, urgency, nocturia, hesitancy, discharge, hematuria, flank pain Breast: Breast lumps, nipple discharge, bleeding.  Musculoskeletal: Denies arthralgia, myalgia, stiffness, Jt. Swelling, pain, limp, and strain/sprain. Denies falls. Skin: Denies puritis, rash,  hives, warts, acne, eczema, changing in skin lesion Neuro: No weakness, tremor, incoordination, spasms, paresthesia, pain Psychiatric: Denies confusion, memory loss, sensory loss. Denies Depression. Endocrine: Denies change in weight, skin, hair change, nocturia, and paresthesia, diabetic polys, visual blurring, hyper / hypo glycemic episodes.  Heme/Lymph: No excessive bleeding, bruising, enlarged lymph nodes.  Physical Exam  BP 126/78   Pulse 92   Temp 97.9 F (36.6 C)   Resp 16   Ht 5' 4.75" (1.645 m)   Wt 247 lb 3.2 oz (112.1 kg)   LMP 04/09/2009   BMI 41.45 kg/m   General Appearance: Over nourished, well groomed and in no apparent distress.  Eyes: PERRLA, EOMs, conjunctiva no swelling or erythema, normal fundi and vessels. Sinuses: No frontal/maxillary tenderness ENT/Mouth: EACs patent / TMs  nl. Nares clear without erythema, swelling, mucoid exudates. Oral hygiene is good. No erythema, swelling, or exudate. Tongue normal, non-obstructing. Tonsils not swollen or erythematous. Hearing normal.  Neck: Supple, thyroid normal. No bruits, nodes or JVD. Respiratory: Respiratory effort normal.  BS equal and clear bilateral without rales, rhonci, wheezing or stridor. Cardio: Heart sounds  are normal with regular rate and rhythm and no murmurs, rubs or gallops. Peripheral pulses are normal and equal bilaterally without edema. No aortic or femoral bruits. Chest: symmetric with normal excursions and percussion. Breasts: Symmetric, without lumps, nipple discharge, retractions, or fibrocystic changes.  Abdomen:  Soft, obese with bowel sounds active. Nontender, no guarding, rebound, hernias, masses, or organomegaly.  Lymphatics: Non tender without lymphadenopathy.  Genitourinary:  Musculoskeletal: Full ROM all peripheral extremities, joint stability, 5/5 strength, and normal gait. Skin: Warm and dry without rashes, lesions, cyanosis, clubbing or  ecchymosis.  Neuro: Cranial nerves intact, reflexes equal bilaterally in UE and absent in LEW's. . Normal muscle tone, no cerebellar symptoms. Sensation intact to touch, vibratory and Monofilament to the toes bilaterally.  Pysch: Alert and oriented X 3, normal affect, Insight and Judgment appropriate.   Assessment and Plan  1. Annual Preventative Screening Examination  2. Hypertension  - EKG 12-Lead - [place], RETROPERITNL ABD,  LTD - Urinalysis, Routine w reflex microscopic - Microalbumin / creatinine urine ratio - CBC with Differential/Platelet - BASIC METABOLIC PANEL WITH GFR - Magnesium - TSH  3. Hyperlipidemia, mixed  - EKG 12-Lead - [place], RETROPERITNL ABD,  LTD - Hepatic function panel - Lipid panel - TSH  4. Controlled type 2 diabetes mellitus with stage 2 chronic kidney disease, without long-term current use of insulin ([facility])  - EKG 12-Lead - [place], RETROPERITNL ABD,  LTD - HM DIABETES FOOT EXAM - LOW EXTREMITY NEUR EXAM DOCUM - Hemoglobin A1c - Insulin, random  5. Vitamin D deficiency  - VITAMIN D 25 Hydroxy   6. Obesity, Class III, BMI 40-49.9 (morbid obesity) ([place])   7. Screening for rectal cancer  - POC Hemoccult Bld/Stl   8. Screening for ischemic heart disease  - EKG 12-Lead - Lipid panel  9. Screening  for AAA (aortic abdominal aneurysm)  - [place], RETROPERITNL ABD,  LTD  10. OSA (obstructive sleep apnea)   11. Fatigue, unspecified type  - Vitamin B12 - Iron and TIBC - TSH  12. Medication management  - Urinalysis, Routine w reflex microscopic - Microalbumin / creatinine urine ratio - CBC with Differential/Platelet - BASIC METABOLIC PANEL WITH GFR - Hepatic function panel - Magnesium - Lipid panel - TSH - Hemoglobin A1c - Insulin, random - VITAMIN D 25 Hydroxy       Patient was counseled in prudent  diet to achieve/maintain BMI less than 25 for weight control, BP monitoring, regular exercise and medications. Discussed med's effects and SE's. Screening labs and tests as requested with regular follow-up as recommended. Over 40 minutes of exam, counseling, chart review and high complex critical decision making was performed.

## 2016-07-13 ENCOUNTER — Encounter: Payer: Self-pay | Admitting: Internal Medicine

## 2016-07-13 ENCOUNTER — Other Ambulatory Visit: Payer: Self-pay | Admitting: *Deleted

## 2016-07-13 ENCOUNTER — Ambulatory Visit (INDEPENDENT_AMBULATORY_CARE_PROVIDER_SITE_OTHER): Payer: 59 | Admitting: Internal Medicine

## 2016-07-13 VITALS — BP 126/78 | HR 92 | Temp 97.9°F | Resp 16 | Ht 64.75 in | Wt 247.2 lb

## 2016-07-13 DIAGNOSIS — Z136 Encounter for screening for cardiovascular disorders: Secondary | ICD-10-CM | POA: Diagnosis not present

## 2016-07-13 DIAGNOSIS — Z0000 Encounter for general adult medical examination without abnormal findings: Secondary | ICD-10-CM | POA: Diagnosis not present

## 2016-07-13 DIAGNOSIS — Z1212 Encounter for screening for malignant neoplasm of rectum: Secondary | ICD-10-CM

## 2016-07-13 DIAGNOSIS — Z79899 Other long term (current) drug therapy: Secondary | ICD-10-CM

## 2016-07-13 DIAGNOSIS — R5383 Other fatigue: Secondary | ICD-10-CM

## 2016-07-13 DIAGNOSIS — N182 Chronic kidney disease, stage 2 (mild): Secondary | ICD-10-CM

## 2016-07-13 DIAGNOSIS — E559 Vitamin D deficiency, unspecified: Secondary | ICD-10-CM

## 2016-07-13 DIAGNOSIS — E1122 Type 2 diabetes mellitus with diabetic chronic kidney disease: Secondary | ICD-10-CM

## 2016-07-13 DIAGNOSIS — I10 Essential (primary) hypertension: Secondary | ICD-10-CM | POA: Diagnosis not present

## 2016-07-13 DIAGNOSIS — E782 Mixed hyperlipidemia: Secondary | ICD-10-CM

## 2016-07-13 DIAGNOSIS — Z0001 Encounter for general adult medical examination with abnormal findings: Secondary | ICD-10-CM

## 2016-07-13 DIAGNOSIS — G4733 Obstructive sleep apnea (adult) (pediatric): Secondary | ICD-10-CM

## 2016-07-13 LAB — CBC WITH DIFFERENTIAL/PLATELET
BASOS PCT: 0 %
Basophils Absolute: 0 cells/uL (ref 0–200)
EOS ABS: 202 {cells}/uL (ref 15–500)
EOS PCT: 2 %
HEMATOCRIT: 37.5 % (ref 35.0–45.0)
HEMOGLOBIN: 12 g/dL (ref 11.7–15.5)
LYMPHS PCT: 16 %
Lymphs Abs: 1616 cells/uL (ref 850–3900)
MCH: 27.4 pg (ref 27.0–33.0)
MCHC: 32 g/dL (ref 32.0–36.0)
MCV: 85.6 fL (ref 80.0–100.0)
MONO ABS: 808 {cells}/uL (ref 200–950)
MPV: 10 fL (ref 7.5–12.5)
Monocytes Relative: 8 %
Neutro Abs: 7474 cells/uL (ref 1500–7800)
Neutrophils Relative %: 74 %
Platelets: 262 10*3/uL (ref 140–400)
RBC: 4.38 MIL/uL (ref 3.80–5.10)
RDW: 15.8 % — AB (ref 11.0–15.0)
WBC: 10.1 10*3/uL (ref 3.8–10.8)

## 2016-07-13 LAB — BASIC METABOLIC PANEL WITH GFR
BUN: 17 mg/dL (ref 7–25)
CALCIUM: 9.5 mg/dL (ref 8.6–10.4)
CO2: 28 mmol/L (ref 20–31)
Chloride: 104 mmol/L (ref 98–110)
Creat: 0.87 mg/dL (ref 0.50–0.99)
GFR, EST AFRICAN AMERICAN: 82 mL/min (ref >=60)
GFR, EST NON AFRICAN AMERICAN: 71 mL/min (ref >=60)
Glucose, Bld: 86 mg/dL (ref 65–99)
Potassium: 4.8 mmol/L (ref 3.5–5.3)
SODIUM: 141 mmol/L (ref 135–146)

## 2016-07-13 LAB — LIPID PANEL
CHOL/HDL RATIO: 2.8 ratio (ref <5.0)
CHOLESTEROL: 149 mg/dL (ref <200)
HDL: 54 mg/dL (ref >50)
LDL CALC: 75 mg/dL (ref <100)
TRIGLYCERIDES: 99 mg/dL (ref <150)
VLDL: 20 mg/dL (ref <30)

## 2016-07-13 LAB — HEPATIC FUNCTION PANEL
ALK PHOS: 164 U/L — AB (ref 33–130)
ALT: 16 U/L (ref 6–29)
AST: 25 U/L (ref 10–35)
Albumin: 3.8 g/dL (ref 3.6–5.1)
BILIRUBIN DIRECT: 0.1 mg/dL (ref <=0.2)
BILIRUBIN INDIRECT: 0.2 mg/dL (ref 0.2–1.2)
BILIRUBIN TOTAL: 0.3 mg/dL (ref 0.2–1.2)
Total Protein: 6.5 g/dL (ref 6.1–8.1)

## 2016-07-13 LAB — IRON AND TIBC
%SAT: 11 % (ref 11–50)
IRON: 39 ug/dL — AB (ref 45–160)
TIBC: 341 ug/dL (ref 250–450)
UIBC: 302 ug/dL (ref 125–400)

## 2016-07-13 LAB — TSH: TSH: 2.06 mIU/L

## 2016-07-13 LAB — VITAMIN B12: Vitamin B-12: 925 pg/mL (ref 200–1100)

## 2016-07-13 MED ORDER — PREDNISONE 20 MG PO TABS: ORAL_TABLET | ORAL | 0 refills | Status: DC

## 2016-07-13 MED ORDER — ALBUTEROL SULFATE HFA 108 (90 BASE) MCG/ACT IN AERS: 2.0000 | INHALATION_SPRAY | Freq: Four times a day (QID) | RESPIRATORY_TRACT | 1 refills | Status: DC | PRN

## 2016-07-13 MED ORDER — GABAPENTIN 600 MG PO TABS: ORAL_TABLET | ORAL | 1 refills | Status: DC

## 2016-07-13 NOTE — Patient Instructions (Signed)

## 2016-07-14 LAB — URINALYSIS, ROUTINE W REFLEX MICROSCOPIC
Bilirubin Urine: NEGATIVE
Glucose, UA: NEGATIVE
Ketones, ur: NEGATIVE
NITRITE: NEGATIVE
PH: 5 (ref 5.0–8.0)
Protein, ur: NEGATIVE
SPECIFIC GRAVITY, URINE: 1.011 (ref 1.001–1.035)

## 2016-07-14 LAB — URINALYSIS, MICROSCOPIC ONLY
BACTERIA UA: NONE SEEN [HPF]
CRYSTALS: NONE SEEN [HPF]
Casts: NONE SEEN [LPF]
RBC / HPF: NONE SEEN RBC/HPF (ref <=2)
Yeast: NONE SEEN [HPF]

## 2016-07-14 LAB — MICROALBUMIN / CREATININE URINE RATIO
Creatinine, Urine: 43 mg/dL (ref 20–320)
MICROALB UR: 0.3 mg/dL
MICROALB/CREAT RATIO: 7 ug/mg{creat} (ref <30)

## 2016-07-14 LAB — MAGNESIUM: Magnesium: 2 mg/dL (ref 1.5–2.5)

## 2016-07-14 LAB — INSULIN, RANDOM: Insulin: 13 u[IU]/mL (ref 2.0–19.6)

## 2016-07-14 LAB — VITAMIN D 25 HYDROXY (VIT D DEFICIENCY, FRACTURES): Vit D, 25-Hydroxy: 59 ng/mL (ref 30–100)

## 2016-07-14 LAB — HEMOGLOBIN A1C
Hgb A1c MFr Bld: 5.8 % — ABNORMAL HIGH (ref <5.7)
MEAN PLASMA GLUCOSE: 120 mg/dL

## 2016-07-20 ENCOUNTER — Encounter: Payer: Self-pay | Admitting: Internal Medicine

## 2016-07-20 ENCOUNTER — Other Ambulatory Visit: Payer: Self-pay | Admitting: Internal Medicine

## 2016-07-20 MED ORDER — METOCLOPRAMIDE HCL 10 MG PO TABS: ORAL_TABLET | ORAL | 1 refills | Status: DC

## 2016-07-30 ENCOUNTER — Other Ambulatory Visit: Payer: Self-pay

## 2016-07-30 DIAGNOSIS — Z1212 Encounter for screening for malignant neoplasm of rectum: Secondary | ICD-10-CM

## 2016-07-30 LAB — POC HEMOCCULT BLD/STL (HOME/3-CARD/SCREEN)
Card #2 Fecal Occult Blod, POC: NEGATIVE
FECAL OCCULT BLD: NEGATIVE
FECAL OCCULT BLD: NEGATIVE

## 2016-07-31 ENCOUNTER — Other Ambulatory Visit: Payer: Self-pay | Admitting: Internal Medicine

## 2016-08-04 ENCOUNTER — Other Ambulatory Visit: Payer: Self-pay | Admitting: Internal Medicine

## 2016-08-26 ENCOUNTER — Other Ambulatory Visit: Payer: Self-pay | Admitting: Internal Medicine

## 2016-08-28 ENCOUNTER — Other Ambulatory Visit: Payer: Self-pay | Admitting: Internal Medicine

## 2016-09-03 ENCOUNTER — Other Ambulatory Visit: Payer: Self-pay | Admitting: Neurological Surgery

## 2016-09-06 HISTORY — PX: LUMBAR FUSION: SHX111

## 2016-09-07 ENCOUNTER — Other Ambulatory Visit: Payer: Self-pay | Admitting: Internal Medicine

## 2016-09-21 ENCOUNTER — Encounter (HOSPITAL_COMMUNITY)
Admission: RE | Admit: 2016-09-21 | Discharge: 2016-09-21 | Disposition: A | Discharge status: Home / Self Care | Payer: 59 | Source: Ambulatory Visit | Attending: Neurological Surgery | Admitting: Neurological Surgery

## 2016-09-21 ENCOUNTER — Encounter (HOSPITAL_COMMUNITY): Payer: Self-pay

## 2016-09-21 DIAGNOSIS — M4310 Spondylolisthesis, site unspecified: Secondary | ICD-10-CM | POA: Insufficient documentation

## 2016-09-21 HISTORY — DX: Chronic obstructive pulmonary disease, unspecified: J44.9

## 2016-09-21 HISTORY — DX: Type 2 diabetes mellitus without complications: E11.9

## 2016-09-21 HISTORY — DX: Gastro-esophageal reflux disease without esophagitis: K21.9

## 2016-09-21 HISTORY — DX: Anemia, unspecified: D64.9

## 2016-09-21 LAB — BASIC METABOLIC PANEL
Anion gap: 10 (ref 5–15)
BUN: 15 mg/dL (ref 6–20)
CHLORIDE: 102 mmol/L (ref 101–111)
CO2: 26 mmol/L (ref 22–32)
Calcium: 9.3 mg/dL (ref 8.9–10.3)
Creatinine, Ser: 0.85 mg/dL (ref 0.44–1.00)
GFR calc Af Amer: >60 mL/min (ref >60)
GFR calc non Af Amer: >60 mL/min (ref >60)
GLUCOSE: 78 mg/dL (ref 65–99)
POTASSIUM: 4.1 mmol/L (ref 3.5–5.1)
SODIUM: 138 mmol/L (ref 135–145)

## 2016-09-21 LAB — CBC WITH DIFFERENTIAL/PLATELET
Basophils Absolute: 0 10*3/uL (ref 0.0–0.1)
Basophils Relative: 0 %
EOS PCT: 3 %
Eosinophils Absolute: 0.2 10*3/uL (ref 0.0–0.7)
HCT: 37.2 % (ref 36.0–46.0)
Hemoglobin: 12.1 g/dL (ref 12.0–15.0)
LYMPHS ABS: 2.2 10*3/uL (ref 0.7–4.0)
LYMPHS PCT: 31 %
MCH: 27.5 pg (ref 26.0–34.0)
MCHC: 32.5 g/dL (ref 30.0–36.0)
MCV: 84.5 fL (ref 78.0–100.0)
Monocytes Absolute: 0.6 10*3/uL (ref 0.1–1.0)
Monocytes Relative: 8 %
Neutro Abs: 4.2 10*3/uL (ref 1.7–7.7)
Neutrophils Relative %: 58 %
PLATELETS: 264 10*3/uL (ref 150–400)
RBC: 4.4 MIL/uL (ref 3.87–5.11)
RDW: 15.4 % (ref 11.5–15.5)
WBC: 7.2 10*3/uL (ref 4.0–10.5)

## 2016-09-21 LAB — PROTIME-INR
INR: 0.96
Prothrombin Time: 12.8 seconds (ref 11.4–15.2)

## 2016-09-21 LAB — TYPE AND SCREEN
ABO/RH(D): O POS
ANTIBODY SCREEN: NEGATIVE

## 2016-09-21 LAB — GLUCOSE, CAPILLARY: Glucose-Capillary: 92 mg/dL (ref 65–99)

## 2016-09-21 LAB — SURGICAL PCR SCREEN
MRSA, PCR: NEGATIVE
Staphylococcus aureus: NEGATIVE

## 2016-09-21 MED ORDER — CHLORHEXIDINE GLUCONATE CLOTH 2 % EX PADS: 6.0000 | MEDICATED_PAD | Freq: Once | CUTANEOUS | Status: DC

## 2016-09-21 NOTE — Pre-Procedure Instructions (Signed)
NELSON JULSON  09/21/2016      [facility] [place], [place] AT [address] [place] [address] Phone: [phone] Fax: [phone]  [place], [place] [address] [place] [address] Phone: [phone] Fax: [phone]    Your procedure is scheduled on Fri. July 20  Report to [place] at 9:15 A.M.  Call this number if you have problems the morning of surgery:  [phone]   Remember:  Do not eat food or drink liquids after midnight.  Take these medicines the morning of surgery with A SIP OF WATER : albuterol if needed-bring to hospital, alprazolam (xanax), baclofen (lioresal),cyclobenzaprine (flexeril) if needed, flonase nasal spray if needed, gabapentin(neurontin),hyoscyamine (lebid), singular, tramadol if needed,venlafaxine (effexor) pantoprazole (protonix)              Stop aspirin, advil, motrin, ibuprofen, aleve, BC Powders, Goody's, meloxicam (mobic), vitamins and herbal medicines.     How to Manage Your Diabetes Before and After Surgery  Why is it important to control my blood sugar before and after surgery? . Improving blood sugar levels before and after surgery helps healing and can limit problems. . A way of improving blood sugar control is eating a healthy diet by: o  Eating less sugar and carbohydrates o  Increasing activity/exercise o  Talking with your doctor about reaching your blood sugar goals . High blood sugars (greater than 180 mg/dL) can raise your risk of infections and slow your recovery, so you will need to focus on controlling your diabetes during the weeks before surgery. . Make sure that the doctor who takes care of your diabetes knows about your planned surgery including the date and location.  How do I manage my blood sugar before surgery? . Check your blood sugar at least 4  times a day, starting 2 days before surgery, to make sure that the level is not too high or low. o Check your blood sugar the morning of your surgery when you wake up and every 2 hours until you get to the Short Stay unit. . If your blood sugar is less than 70 mg/dL, you will need to treat for low blood sugar: o Do not take insulin. o Treat a low blood sugar (less than 70 mg/dL) with  cup of clear juice (cranberry or apple), 4 glucose tablets, OR glucose gel. o Recheck blood sugar in 15 minutes after treatment (to make sure it is greater than 70 mg/dL). If your blood sugar is not greater than 70 mg/dL on recheck, call [phone] for further instructions. . Report your blood sugar to the short stay nurse when you get to Short Stay.  . If you are admitted to the hospital after surgery: o Your blood sugar will be checked by the staff and you will probably be given insulin after surgery (instead of oral diabetes medicines) to make sure you have good blood sugar levels. o The goal for blood sugar control after surgery is 80-180 mg/dL.      WHAT DO I DO ABOUT MY DIABETES MEDICATION?   Marland Kitchen Do not take oral diabetes medicines (pills) the morning of surgery.    Other Instructions:          Patient Signature:  Date:   Nurse Signature:  Date:   Reviewed and Endorsed by [place] Patient Education  Committee, August 2015   Do not wear jewelry, make-up or nail polish.  Do not wear lotions, powders, or perfumes, or deoderant.  Do not shave 48 hours prior to surgery.  Men may shave face and neck.  Do not bring valuables to the hospital.  [facility] is not responsible for any belongings or valuables.  Contacts, dentures or bridgework may not be worn into surgery.  Leave your suitcase in the car.  After surgery it may be brought to your room.  For patients admitted to the hospital, discharge time will be determined by your treatment team.  Patients discharged the day of surgery will  not be allowed to drive home.    Special instructions:  [facility]- Preparing For Surgery  Before surgery, you can play an important role. Because skin is not sterile, your skin needs to be as free of germs as possible. You can reduce the number of germs on your skin by washing with CHG (chlorahexidine gluconate) Soap before surgery.  CHG is an antiseptic cleaner which kills germs and bonds with the skin to continue killing germs even after washing.  Please do not use if you have an allergy to CHG or antibacterial soaps. If your skin becomes reddened/irritated stop using the CHG.  Do not shave (including legs and underarms) for at least 48 hours prior to first CHG shower. It is OK to shave your face.  Please follow these instructions carefully.   1. Shower the NIGHT BEFORE SURGERY and the MORNING OF SURGERY with CHG.   2. If you chose to wash your hair, wash your hair first as usual with your normal shampoo.  3. After you shampoo, rinse your hair and body thoroughly to remove the shampoo.  4. Use CHG as you would any other liquid soap. You can apply CHG directly to the skin and wash gently with a scrungie or a clean washcloth.   5. Apply the CHG Soap to your body ONLY FROM THE NECK DOWN.  Do not use on open wounds or open sores. Avoid contact with your eyes, ears, mouth and genitals (private parts). Wash genitals (private parts) with your normal soap.  6. Wash thoroughly, paying special attention to the area where your surgery will be performed.  7. Thoroughly rinse your body with warm water from the neck down.  8. DO NOT shower/wash with your normal soap after using and rinsing off the CHG Soap.  9. Pat yourself dry with a CLEAN TOWEL.   10. Wear CLEAN PAJAMAS   11. Place CLEAN SHEETS on your bed the night of your first shower and DO NOT SLEEP WITH PETS.    Day of Surgery: Do not apply any deodorants/lotions. Please wear clean clothes to the hospital/surgery center.       Please read over the following fact sheets that you were given. Coughing and Deep Breathing, MRSA Information and Surgical Site Infection Prevention

## 2016-09-24 MED ORDER — DEXAMETHASONE SODIUM PHOSPHATE 10 MG/ML IJ SOLN
10.0000 mg | INTRAMUSCULAR | Status: AC
  Administered 2016-09-25: 10 mg via INTRAVENOUS
  Filled 2016-09-24: qty 1

## 2016-09-24 MED ORDER — VANCOMYCIN HCL IN DEXTROSE 1-5 GM/200ML-% IV SOLN
1000.0000 mg | INTRAVENOUS | Status: AC
  Administered 2016-09-25: 1000 mg via INTRAVENOUS
  Filled 2016-09-24: qty 200

## 2016-09-25 ENCOUNTER — Inpatient Hospital Stay (HOSPITAL_COMMUNITY)
Admission: RE | Admit: 2016-09-25 | Discharge: 2016-09-26 | DRG: 454 | Disposition: A | Discharge status: Home Health | Payer: 59 | Source: Ambulatory Visit | Attending: Neurological Surgery | Admitting: Neurological Surgery

## 2016-09-25 ENCOUNTER — Encounter (HOSPITAL_COMMUNITY)
Admission: RE | Disposition: A | Discharge status: Home Health | Payer: Self-pay | Source: Ambulatory Visit | Attending: Neurological Surgery

## 2016-09-25 ENCOUNTER — Inpatient Hospital Stay (HOSPITAL_COMMUNITY): Payer: 59 | Admitting: Anesthesiology

## 2016-09-25 ENCOUNTER — Encounter (HOSPITAL_COMMUNITY): Payer: Self-pay | Admitting: Anesthesiology

## 2016-09-25 ENCOUNTER — Inpatient Hospital Stay (HOSPITAL_COMMUNITY): Payer: 59

## 2016-09-25 DIAGNOSIS — G4733 Obstructive sleep apnea (adult) (pediatric): Secondary | ICD-10-CM | POA: Diagnosis present

## 2016-09-25 DIAGNOSIS — Z7984 Long term (current) use of oral hypoglycemic drugs: Secondary | ICD-10-CM | POA: Diagnosis not present

## 2016-09-25 DIAGNOSIS — Z880 Allergy status to penicillin: Secondary | ICD-10-CM

## 2016-09-25 DIAGNOSIS — J449 Chronic obstructive pulmonary disease, unspecified: Secondary | ICD-10-CM | POA: Diagnosis present

## 2016-09-25 DIAGNOSIS — M48061 Spinal stenosis, lumbar region without neurogenic claudication: Secondary | ICD-10-CM | POA: Diagnosis present

## 2016-09-25 DIAGNOSIS — Z888 Allergy status to other drugs, medicaments and biological substances status: Secondary | ICD-10-CM

## 2016-09-25 DIAGNOSIS — Z7982 Long term (current) use of aspirin: Secondary | ICD-10-CM

## 2016-09-25 DIAGNOSIS — E119 Type 2 diabetes mellitus without complications: Secondary | ICD-10-CM | POA: Diagnosis present

## 2016-09-25 DIAGNOSIS — Z6841 Body Mass Index (BMI) 40.0 and over, adult: Secondary | ICD-10-CM | POA: Diagnosis not present

## 2016-09-25 DIAGNOSIS — F419 Anxiety disorder, unspecified: Secondary | ICD-10-CM | POA: Diagnosis present

## 2016-09-25 DIAGNOSIS — Z881 Allergy status to other antibiotic agents status: Secondary | ICD-10-CM | POA: Diagnosis not present

## 2016-09-25 DIAGNOSIS — I10 Essential (primary) hypertension: Secondary | ICD-10-CM | POA: Diagnosis present

## 2016-09-25 DIAGNOSIS — K219 Gastro-esophageal reflux disease without esophagitis: Secondary | ICD-10-CM | POA: Diagnosis present

## 2016-09-25 DIAGNOSIS — M4316 Spondylolisthesis, lumbar region: Secondary | ICD-10-CM | POA: Diagnosis present

## 2016-09-25 DIAGNOSIS — Z882 Allergy status to sulfonamides status: Secondary | ICD-10-CM | POA: Diagnosis not present

## 2016-09-25 DIAGNOSIS — Z886 Allergy status to analgesic agent status: Secondary | ICD-10-CM

## 2016-09-25 DIAGNOSIS — F17210 Nicotine dependence, cigarettes, uncomplicated: Secondary | ICD-10-CM | POA: Diagnosis present

## 2016-09-25 DIAGNOSIS — Z419 Encounter for procedure for purposes other than remedying health state, unspecified: Secondary | ICD-10-CM

## 2016-09-25 DIAGNOSIS — M545 Low back pain: Secondary | ICD-10-CM | POA: Diagnosis present

## 2016-09-25 DIAGNOSIS — Z981 Arthrodesis status: Secondary | ICD-10-CM

## 2016-09-25 DIAGNOSIS — Z96652 Presence of left artificial knee joint: Secondary | ICD-10-CM | POA: Diagnosis present

## 2016-09-25 LAB — GLUCOSE, CAPILLARY
GLUCOSE-CAPILLARY: 89 mg/dL (ref 65–99)
GLUCOSE-CAPILLARY: 181 mg/dL — AB (ref 65–99)
Glucose-Capillary: 152 mg/dL — ABNORMAL HIGH (ref 65–99)
Glucose-Capillary: 136 mg/dL — ABNORMAL HIGH (ref 65–99)

## 2016-09-25 SURGERY — POSTERIOR LUMBAR FUSION 1 LEVEL
Anesthesia: General | Site: Back

## 2016-09-25 MED ORDER — METOCLOPRAMIDE HCL 10 MG PO TABS
10.0000 mg | ORAL_TABLET | Freq: Every day | ORAL | Status: DC
  Administered 2016-09-25: 10 mg via ORAL
  Filled 2016-09-25: qty 1

## 2016-09-25 MED ORDER — ALBUTEROL SULFATE HFA 108 (90 BASE) MCG/ACT IN AERS: 2.0000 | INHALATION_SPRAY | Freq: Four times a day (QID) | RESPIRATORY_TRACT | Status: DC | PRN

## 2016-09-25 MED ORDER — ASPIRIN EC 81 MG PO TBEC
81.0000 mg | DELAYED_RELEASE_TABLET | Freq: Every day | ORAL | Status: DC
  Administered 2016-09-25: 81 mg via ORAL
  Filled 2016-09-25: qty 1

## 2016-09-25 MED ORDER — 0.9 % SODIUM CHLORIDE (POUR BTL) OPTIME
TOPICAL | Status: DC | PRN
  Administered 2016-09-25: 1000 mL

## 2016-09-25 MED ORDER — SUGAMMADEX SODIUM 200 MG/2ML IV SOLN
INTRAVENOUS | Status: AC
  Filled 2016-09-25: qty 2

## 2016-09-25 MED ORDER — ROCURONIUM BROMIDE 100 MG/10ML IV SOLN
INTRAVENOUS | Status: DC | PRN
  Administered 2016-09-25: 50 mg via INTRAVENOUS

## 2016-09-25 MED ORDER — MENTHOL 3 MG MT LOZG: 1.0000 | LOZENGE | OROMUCOSAL | Status: DC | PRN

## 2016-09-25 MED ORDER — MELATONIN 3 MG PO TABS
3.0000 mg | ORAL_TABLET | Freq: Every day | ORAL | Status: DC
  Administered 2016-09-25: 3 mg via ORAL
  Filled 2016-09-25: qty 1

## 2016-09-25 MED ORDER — SODIUM CHLORIDE 0.9% FLUSH: 3.0000 mL | Freq: Two times a day (BID) | INTRAVENOUS | Status: DC

## 2016-09-25 MED ORDER — PROPOFOL 10 MG/ML IV BOLUS
INTRAVENOUS | Status: AC
  Filled 2016-09-25: qty 20

## 2016-09-25 MED ORDER — VENLAFAXINE HCL ER 75 MG PO CP24
150.0000 mg | ORAL_CAPSULE | Freq: Two times a day (BID) | ORAL | Status: DC
  Administered 2016-09-25 – 2016-09-26 (×2): 150 mg via ORAL
  Filled 2016-09-25 (×2): qty 2

## 2016-09-25 MED ORDER — ONDANSETRON HCL 4 MG/2ML IJ SOLN
INTRAMUSCULAR | Status: AC
  Filled 2016-09-25: qty 2

## 2016-09-25 MED ORDER — BACLOFEN 10 MG PO TABS
10.0000 mg | ORAL_TABLET | Freq: Two times a day (BID) | ORAL | Status: DC
  Administered 2016-09-25 – 2016-09-26 (×2): 10 mg via ORAL
  Filled 2016-09-25 (×2): qty 1

## 2016-09-25 MED ORDER — LIDOCAINE HCL (CARDIAC) 20 MG/ML IV SOLN
INTRAVENOUS | Status: DC | PRN
  Administered 2016-09-25: 100 mg via INTRAVENOUS

## 2016-09-25 MED ORDER — HYDROCHLOROTHIAZIDE 12.5 MG PO CAPS
12.5000 mg | ORAL_CAPSULE | Freq: Every day | ORAL | Status: DC
  Filled 2016-09-25: qty 1

## 2016-09-25 MED ORDER — BUPIVACAINE HCL (PF) 0.25 % IJ SOLN
INTRAMUSCULAR | Status: AC
  Filled 2016-09-25: qty 30

## 2016-09-25 MED ORDER — SENNA 8.6 MG PO TABS
1.0000 | ORAL_TABLET | Freq: Two times a day (BID) | ORAL | Status: DC
  Administered 2016-09-25 – 2016-09-26 (×2): 8.6 mg via ORAL
  Filled 2016-09-25 (×2): qty 1

## 2016-09-25 MED ORDER — PHENYLEPHRINE HCL 10 MG/ML IJ SOLN
INTRAVENOUS | Status: DC | PRN
  Administered 2016-09-25: 50 ug/min via INTRAVENOUS

## 2016-09-25 MED ORDER — INSULIN ASPART 100 UNIT/ML ~~LOC~~ SOLN: 0.0000 [IU] | Freq: Three times a day (TID) | SUBCUTANEOUS | Status: DC

## 2016-09-25 MED ORDER — MIDAZOLAM HCL 5 MG/5ML IJ SOLN
INTRAMUSCULAR | Status: DC | PRN
  Administered 2016-09-25: 2 mg via INTRAVENOUS

## 2016-09-25 MED ORDER — POTASSIUM CHLORIDE IN NACL 20-0.9 MEQ/L-% IV SOLN: INTRAVENOUS | Status: DC

## 2016-09-25 MED ORDER — LACTATED RINGERS IV SOLN
INTRAVENOUS | Status: DC
  Administered 2016-09-25 (×2): via INTRAVENOUS

## 2016-09-25 MED ORDER — BUPIVACAINE HCL (PF) 0.25 % IJ SOLN
INTRAMUSCULAR | Status: DC | PRN
Start: 2016-09-25 — End: 2016-09-25
  Administered 2016-09-25: 5 mL

## 2016-09-25 MED ORDER — EPHEDRINE SULFATE-NACL 50-0.9 MG/10ML-% IV SOSY
PREFILLED_SYRINGE | INTRAVENOUS | Status: DC | PRN
  Administered 2016-09-25 (×3): 5 mg via INTRAVENOUS
  Administered 2016-09-25: 10 mg via INTRAVENOUS

## 2016-09-25 MED ORDER — ONDANSETRON HCL 4 MG PO TABS: 4.0000 mg | ORAL_TABLET | Freq: Four times a day (QID) | ORAL | Status: DC | PRN

## 2016-09-25 MED ORDER — ACETAMINOPHEN 325 MG PO TABS
650.0000 mg | ORAL_TABLET | ORAL | Status: DC | PRN
  Administered 2016-09-25: 650 mg via ORAL
  Administered 2016-09-26: 325 mg via ORAL

## 2016-09-25 MED ORDER — PROPOFOL 10 MG/ML IV BOLUS
INTRAVENOUS | Status: DC | PRN
  Administered 2016-09-25: 200 mg via INTRAVENOUS

## 2016-09-25 MED ORDER — HYOSCYAMINE SULFATE ER 0.375 MG PO TB12
0.3750 mg | ORAL_TABLET | Freq: Every day | ORAL | Status: DC
  Administered 2016-09-26: 0.375 mg via ORAL
  Filled 2016-09-25: qty 1

## 2016-09-25 MED ORDER — THROMBIN 5000 UNITS EX SOLR
OROMUCOSAL | Status: DC | PRN
  Administered 2016-09-25: 5 mL via TOPICAL

## 2016-09-25 MED ORDER — PHENYLEPHRINE 40 MCG/ML (10ML) SYRINGE FOR IV PUSH (FOR BLOOD PRESSURE SUPPORT)
PREFILLED_SYRINGE | INTRAVENOUS | Status: DC | PRN
  Administered 2016-09-25: 120 ug via INTRAVENOUS
  Administered 2016-09-25: 200 ug via INTRAVENOUS
  Administered 2016-09-25 (×2): 80 ug via INTRAVENOUS
  Administered 2016-09-25: 120 ug via INTRAVENOUS

## 2016-09-25 MED ORDER — OXYCODONE HCL 5 MG PO TABS
5.0000 mg | ORAL_TABLET | ORAL | Status: DC | PRN
  Administered 2016-09-25: 5 mg via ORAL
  Administered 2016-09-25 – 2016-09-26 (×3): 10 mg via ORAL
  Administered 2016-09-26: 5 mg via ORAL
  Filled 2016-09-25: qty 1
  Filled 2016-09-25 (×2): qty 2
  Filled 2016-09-25: qty 1

## 2016-09-25 MED ORDER — FENTANYL CITRATE (PF) 100 MCG/2ML IJ SOLN
25.0000 ug | INTRAMUSCULAR | Status: DC | PRN
  Administered 2016-09-25 (×2): 50 ug via INTRAVENOUS

## 2016-09-25 MED ORDER — ALPRAZOLAM 0.25 MG PO TABS
0.2500 mg | ORAL_TABLET | Freq: Two times a day (BID) | ORAL | Status: DC | PRN
Start: 2016-09-25 — End: 2016-09-26
  Administered 2016-09-25: 0.25 mg via ORAL
  Filled 2016-09-25: qty 1

## 2016-09-25 MED ORDER — GABAPENTIN 600 MG PO TABS
600.0000 mg | ORAL_TABLET | Freq: Two times a day (BID) | ORAL | Status: DC
  Administered 2016-09-25 – 2016-09-26 (×2): 600 mg via ORAL
  Filled 2016-09-25 (×2): qty 1

## 2016-09-25 MED ORDER — ACETAMINOPHEN 650 MG RE SUPP: 650.0000 mg | RECTAL | Status: DC | PRN

## 2016-09-25 MED ORDER — PANTOPRAZOLE SODIUM 40 MG PO TBEC
40.0000 mg | DELAYED_RELEASE_TABLET | Freq: Every day | ORAL | Status: DC
  Administered 2016-09-26: 40 mg via ORAL
  Filled 2016-09-25: qty 1

## 2016-09-25 MED ORDER — ACETAMINOPHEN 325 MG PO TABS
ORAL_TABLET | ORAL | Status: AC
  Administered 2016-09-25: 650 mg via ORAL
  Filled 2016-09-25: qty 2

## 2016-09-25 MED ORDER — IRBESARTAN 300 MG PO TABS
300.0000 mg | ORAL_TABLET | Freq: Every day | ORAL | Status: DC
  Administered 2016-09-26: 300 mg via ORAL
  Filled 2016-09-25: qty 1

## 2016-09-25 MED ORDER — FLUTICASONE PROPIONATE 50 MCG/ACT NA SUSP: 2.0000 | Freq: Every day | NASAL | Status: DC

## 2016-09-25 MED ORDER — SODIUM CHLORIDE 0.9% FLUSH: 3.0000 mL | INTRAVENOUS | Status: DC | PRN

## 2016-09-25 MED ORDER — ONDANSETRON HCL 4 MG/2ML IJ SOLN
INTRAMUSCULAR | Status: DC | PRN
  Administered 2016-09-25: 4 mg via INTRAVENOUS

## 2016-09-25 MED ORDER — FERROUS SULFATE 325 (65 FE) MG PO TABS
325.0000 mg | ORAL_TABLET | Freq: Every day | ORAL | Status: DC
  Administered 2016-09-26: 325 mg via ORAL
  Filled 2016-09-25: qty 1

## 2016-09-25 MED ORDER — SUGAMMADEX SODIUM 200 MG/2ML IV SOLN
INTRAVENOUS | Status: DC | PRN
  Administered 2016-09-25: 200 mg via INTRAVENOUS

## 2016-09-25 MED ORDER — MORPHINE SULFATE (PF) 2 MG/ML IV SOLN: 2.0000 mg | INTRAVENOUS | Status: DC | PRN

## 2016-09-25 MED ORDER — OXYCODONE HCL 5 MG PO TABS
ORAL_TABLET | ORAL | Status: AC
  Filled 2016-09-25: qty 2

## 2016-09-25 MED ORDER — FENTANYL CITRATE (PF) 100 MCG/2ML IJ SOLN
INTRAMUSCULAR | Status: AC
  Administered 2016-09-25: 50 ug via INTRAVENOUS
  Filled 2016-09-25: qty 2

## 2016-09-25 MED ORDER — THROMBIN 20000 UNITS EX SOLR
CUTANEOUS | Status: DC | PRN
  Administered 2016-09-25: 20 mL via TOPICAL

## 2016-09-25 MED ORDER — ALBUTEROL SULFATE (2.5 MG/3ML) 0.083% IN NEBU: 2.5000 mg | INHALATION_SOLUTION | Freq: Four times a day (QID) | RESPIRATORY_TRACT | Status: DC | PRN

## 2016-09-25 MED ORDER — THROMBIN 20000 UNITS EX SOLR
CUTANEOUS | Status: AC
  Filled 2016-09-25: qty 20000

## 2016-09-25 MED ORDER — METFORMIN HCL ER 500 MG PO TB24
1000.0000 mg | ORAL_TABLET | Freq: Two times a day (BID) | ORAL | Status: DC
Start: 2016-09-25 — End: 2016-09-26
  Administered 2016-09-25 – 2016-09-26 (×2): 1000 mg via ORAL
  Filled 2016-09-25 (×2): qty 2

## 2016-09-25 MED ORDER — METHOCARBAMOL 500 MG PO TABS: 500.0000 mg | ORAL_TABLET | Freq: Four times a day (QID) | ORAL | Status: DC | PRN

## 2016-09-25 MED ORDER — MORPHINE SULFATE (PF) 4 MG/ML IV SOLN
2.0000 mg | INTRAVENOUS | Status: DC | PRN
Start: 2016-09-25 — End: 2016-09-26

## 2016-09-25 MED ORDER — BACITRACIN 50000 UNITS IM SOLR
INTRAMUSCULAR | Status: DC | PRN
  Administered 2016-09-25: 500 mL

## 2016-09-25 MED ORDER — VANCOMYCIN HCL 1000 MG IV SOLR
INTRAVENOUS | Status: AC
  Filled 2016-09-25: qty 1000

## 2016-09-25 MED ORDER — VANCOMYCIN HCL 1000 MG IV SOLR
INTRAVENOUS | Status: DC | PRN
  Administered 2016-09-25: 1000 mg

## 2016-09-25 MED ORDER — ONDANSETRON HCL 4 MG/2ML IJ SOLN: 4.0000 mg | Freq: Four times a day (QID) | INTRAMUSCULAR | Status: DC | PRN

## 2016-09-25 MED ORDER — MIDAZOLAM HCL 2 MG/2ML IJ SOLN
INTRAMUSCULAR | Status: AC
  Filled 2016-09-25: qty 2

## 2016-09-25 MED ORDER — VENLAFAXINE HCL ER 75 MG PO CP24: 150.0000 mg | ORAL_CAPSULE | Freq: Every day | ORAL | Status: DC

## 2016-09-25 MED ORDER — PHENOL 1.4 % MT LIQD: 1.0000 | OROMUCOSAL | Status: DC | PRN

## 2016-09-25 MED ORDER — ROCURONIUM BROMIDE 50 MG/5ML IV SOLN
INTRAVENOUS | Status: AC
  Filled 2016-09-25: qty 1

## 2016-09-25 MED ORDER — FENTANYL CITRATE (PF) 100 MCG/2ML IJ SOLN
INTRAMUSCULAR | Status: DC | PRN
  Administered 2016-09-25: 100 ug via INTRAVENOUS
  Administered 2016-09-25 (×2): 50 ug via INTRAVENOUS

## 2016-09-25 MED ORDER — OLMESARTAN MEDOXOMIL-HCTZ 40-12.5 MG PO TABS: 1.0000 | ORAL_TABLET | Freq: Every day | ORAL | Status: DC

## 2016-09-25 MED ORDER — MONTELUKAST SODIUM 10 MG PO TABS
10.0000 mg | ORAL_TABLET | Freq: Every day | ORAL | Status: DC
Start: 2016-09-25 — End: 2016-09-26
  Administered 2016-09-25: 10 mg via ORAL
  Filled 2016-09-25: qty 1

## 2016-09-25 MED ORDER — FENTANYL CITRATE (PF) 250 MCG/5ML IJ SOLN
INTRAMUSCULAR | Status: AC
  Filled 2016-09-25: qty 5

## 2016-09-25 MED ORDER — VANCOMYCIN HCL IN DEXTROSE 1-5 GM/200ML-% IV SOLN
1000.0000 mg | Freq: Once | INTRAVENOUS | Status: AC
  Administered 2016-09-25: 1000 mg via INTRAVENOUS
  Filled 2016-09-25: qty 200

## 2016-09-25 MED ORDER — THROMBIN 5000 UNITS EX SOLR
CUTANEOUS | Status: AC
  Filled 2016-09-25: qty 5000

## 2016-09-25 SURGICAL SUPPLY — 68 items
ADH SKN CLS APL DERMABOND .7 (GAUZE/BANDAGES/DRESSINGS) ×1
APL SKNCLS STERI-STRIP NONHPOA (GAUZE/BANDAGES/DRESSINGS) ×2
ATEC PORO TI PS 10D 9W 25X8X10 (Bone Implant) ×4 IMPLANT
BAG DECANTER FOR FLEXI CONT (MISCELLANEOUS) ×2 IMPLANT
BASKET BONE COLLECTION (BASKET) ×2 IMPLANT
BENZOIN TINCTURE PRP APPL 2/3 (GAUZE/BANDAGES/DRESSINGS) ×3 IMPLANT
BLADE CLIPPER SURG (BLADE) IMPLANT
BONE CANC CHIPS 20CC PCAN1/4 (Bone Implant) ×2 IMPLANT
BUR MATCHSTICK NEURO 3.0 LAGG (BURR) ×2 IMPLANT
CANISTER SUCT 3000ML PPV (MISCELLANEOUS) ×2 IMPLANT
CARTRIDGE OIL MAESTRO DRILL (MISCELLANEOUS) ×1 IMPLANT
CHIPS CANC BONE 20CC PCAN1/4 (Bone Implant) ×1 IMPLANT
CLSR STERI-STRIP ANTIMIC 1/2X4 (GAUZE/BANDAGES/DRESSINGS) ×1 IMPLANT
CONT SPEC 4OZ CLIKSEAL STRL BL (MISCELLANEOUS) ×2 IMPLANT
COVER BACK TABLE 60X90IN (DRAPES) ×2 IMPLANT
DERMABOND ADVANCED (GAUZE/BANDAGES/DRESSINGS) ×1
DERMABOND ADVANCED .7 DNX12 (GAUZE/BANDAGES/DRESSINGS) IMPLANT
DIFFUSER DRILL AIR PNEUMATIC (MISCELLANEOUS) ×2 IMPLANT
DRAPE C-ARM 42X72 X-RAY (DRAPES) ×3 IMPLANT
DRAPE LAPAROTOMY 100X72X124 (DRAPES) ×2 IMPLANT
DRAPE POUCH INSTRU U-SHP 10X18 (DRAPES) ×2 IMPLANT
DRAPE SURG 17X23 STRL (DRAPES) ×2 IMPLANT
DRSG OPSITE POSTOP 4X6 (GAUZE/BANDAGES/DRESSINGS) ×1 IMPLANT
DURAPREP 26ML APPLICATOR (WOUND CARE) ×2 IMPLANT
ELECT BLADE 4.0 EZ CLEAN MEGAD (MISCELLANEOUS) ×2
ELECT REM PT RETURN 9FT ADLT (ELECTROSURGICAL) ×2
ELECTRODE BLDE 4.0 EZ CLN MEGD (MISCELLANEOUS) IMPLANT
ELECTRODE REM PT RTRN 9FT ADLT (ELECTROSURGICAL) ×1 IMPLANT
EVACUATOR 1/8 PVC DRAIN (DRAIN) ×1 IMPLANT
GAUZE SPONGE 4X4 16PLY XRAY LF (GAUZE/BANDAGES/DRESSINGS) IMPLANT
GLOVE BIO SURGEON STRL SZ7 (GLOVE) ×2 IMPLANT
GLOVE BIO SURGEON STRL SZ8 (GLOVE) ×4 IMPLANT
GLOVE BIOGEL PI IND STRL 7.0 (GLOVE) IMPLANT
GLOVE BIOGEL PI IND STRL 7.5 (GLOVE) IMPLANT
GLOVE BIOGEL PI INDICATOR 7.0 (GLOVE) ×4
GLOVE BIOGEL PI INDICATOR 7.5 (GLOVE) ×1
GLOVE ECLIPSE 7.5 STRL STRAW (GLOVE) ×1 IMPLANT
GLOVE SURG SS PI 7.5 STRL IVOR (GLOVE) ×3 IMPLANT
GOWN STRL REUS W/ TWL LRG LVL3 (GOWN DISPOSABLE) IMPLANT
GOWN STRL REUS W/ TWL XL LVL3 (GOWN DISPOSABLE) ×2 IMPLANT
GOWN STRL REUS W/TWL 2XL LVL3 (GOWN DISPOSABLE) IMPLANT
GOWN STRL REUS W/TWL LRG LVL3 (GOWN DISPOSABLE) ×6
GOWN STRL REUS W/TWL XL LVL3 (GOWN DISPOSABLE) ×6
GRAFT BNE CANC CHIPS 1-8 20CC (Bone Implant) IMPLANT
HEMOSTAT POWDER KIT SURGIFOAM (HEMOSTASIS) ×1 IMPLANT
KIT BASIN OR (CUSTOM PROCEDURE TRAY) ×2 IMPLANT
KIT ROOM TURNOVER OR (KITS) ×2 IMPLANT
NDL HYPO 25X1 1.5 SAFETY (NEEDLE) ×1 IMPLANT
NEEDLE HYPO 25X1 1.5 SAFETY (NEEDLE) ×2 IMPLANT
NS IRRIG 1000ML POUR BTL (IV SOLUTION) ×2 IMPLANT
OIL CARTRIDGE MAESTRO DRILL (MISCELLANEOUS) ×2
PACK LAMINECTOMY NEURO (CUSTOM PROCEDURE TRAY) ×2 IMPLANT
PAD ARMBOARD 7.5X6 YLW CONV (MISCELLANEOUS) ×9 IMPLANT
ROD PC 5.5X35 TI ARSENAL (Rod) ×2 IMPLANT
SCREW CORT CANC 5.5X40 (Screw) ×4 IMPLANT
SCREW SET SPINAL ARSENAL 47127 (Screw) ×4 IMPLANT
SPACER PORUS ATEC 10D9W25X8X10 (Bone Implant) IMPLANT
SPONGE LAP 4X18 X RAY DECT (DISPOSABLE) IMPLANT
SPONGE SURGIFOAM ABS GEL 100 (HEMOSTASIS) ×2 IMPLANT
STRIP CLOSURE SKIN 1/2X4 (GAUZE/BANDAGES/DRESSINGS) ×4 IMPLANT
SUT VIC AB 0 CT1 18XCR BRD8 (SUTURE) ×1 IMPLANT
SUT VIC AB 0 CT1 8-18 (SUTURE) ×2
SUT VIC AB 2-0 CP2 18 (SUTURE) ×2 IMPLANT
SUT VIC AB 3-0 SH 8-18 (SUTURE) ×4 IMPLANT
TOWEL GREEN STERILE (TOWEL DISPOSABLE) ×2 IMPLANT
TOWEL GREEN STERILE FF (TOWEL DISPOSABLE) ×2 IMPLANT
TRAY FOLEY W/METER SILVER 16FR (SET/KITS/TRAYS/PACK) ×2 IMPLANT
WATER STERILE IRR 1000ML POUR (IV SOLUTION) ×2 IMPLANT

## 2016-09-25 NOTE — Op Note (Signed)
09/25/2016  2:14 PM  PATIENT:  Beth [name]  66 y.o. female  PRE-OPERATIVE DIAGNOSIS:    POST-OPERATIVE DIAGNOSIS:  same  PROCEDURE:   1. Decompressive lumbar laminectomy L4-5 requiring more work than would be required for a simple exposure of the disk for PLIF in order to adequately decompress the neural elements and address the spinal stenosis 2. Posterior lumbar interbody fusion L4-5 using PTi interbody cages packed with morcellized allograft and autograft 3. Posterior fixation L4-5 using Atec cortical pedicle screws.  4. Intertransverse arthrodesis L4-5 using morcellized autograft and allograft.  SURGEON:  Sherley Bounds, MD  ASSISTANTSShelba Flake FNP  ANESTHESIA:  General  EBL: 200 ml  Total I/O In: 1300 [I.V.:1300] Out: 600 [Urine:400; Blood:200]  BLOOD ADMINISTERED:none  DRAINS: none   INDICATION FOR PROCEDURE: This patient presented with severe back and leg pain. 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 The patient tried a reasonable attempt at conservative medical measures without relief. I recommended decompression and instrumented fusion to address the stenosis as well as the segmental stability.  Patient understood the risks, benefits, and alternatives and potential outcomes and wished to proceed.  PROCEDURE DETAILS:  The patient was brought to the operating room. After induction of generalized endotracheal anesthesia the patient was rolled into the prone position on chest rolls and all pressure points were padded. The patient's lumbar region was cleaned and then prepped with DuraPrep and draped in the usual sterile fashion. Anesthesia was injected and then a dorsal midline incision was made and carried down to the lumbosacral fascia. The fascia was opened and the paraspinous musculature was taken down in a subperiosteal fashion to expose L4-5. A self-retaining retractor was placed. Intraoperative fluoroscopy confirmed my level, and I started with placement of  the L4 cortical pedicle screws. The pedicle screw entry zones were identified utilizing surface landmarks and  AP and lateral fluoroscopy. I scored the cortex with the high-speed drill and then used the hand drill to drill an upward and outward direction into the pedicle. I then tapped line to line, and the tap was also monitored. I then placed a 5.5 x40 mm cortical pedicle screw into the pedicles of L4 bilaterally. I then turned my attention to the decompression and complete lumbar laminectomies, hemi- facetectomies, and foraminotomies were performed at L4-5. The patient had significant spinal stenosis and this required more work than would be required for a simple exposure of the disc for posterior lumbar interbody fusion. Much more generous decompression was undertaken in order to adequately decompress the neural elements and address the patient's leg pain. The yellow ligament was removed to expose the underlying dura and nerve roots, and generous foraminotomies were performed to adequately decompress the neural elements. Both the exiting and traversing nerve roots were decompressed on both sides until a coronary dilator passed easily along the nerve roots. Once the decompression was complete, I turned my attention to the posterior lower lumbar interbody fusion. The epidural venous vasculature was coagulated and cut sharply. Disc space was incised and the initial discectomy was performed with pituitary rongeurs. The disc space was distracted with sequential distractors to a height of 3mm. We then used a series of scrapers and shavers to prepare the endplates for fusion. The midline was prepared with Epstein curettes. Once the complete discectomy was finished, we packed an appropriate sized PTi interbody cage with local autograft and morcellized allograft, gently retracted the nerve root, and tapped the cage into position at L4-5.  The midline between the cages was packed  with morselized autograft and allograft.  We then turned our attention to the placement of the lower pedicle screws. The pedicle screw entry zones were identified utilizing surface landmarks and fluoroscopy. I drilled into each pedicle utilizing the hand drill, and tapped each pedicle with the appropriate tap. We palpated with a ball probe to assure no break in the cortex. We then placed 5.5 x 40 mm into the pedicles bilaterally at L5. We then decorticated the transverse processes and laid a mixture of morcellized autograft and allograft out over these to perform intertransverse arthrodesis at L4-5. We then placed lordotic rods into the multiaxial screw heads of the pedicle screws and locked these in position with the locking caps and anti-torque device. We then checked our construct with AP and lateral fluoroscopy. Irrigated with copious amounts of bacitracin-containing saline solution. Inspected the nerve roots once again to assure adequate decompression, lined to the dura with Gelfoam, and closed the muscle and the fascia with 0 Vicryl. Closed the subcutaneous tissues with 2-0 Vicryl and subcuticular tissues with 3-0 Vicryl. The skin was closed with benzoin and Steri-Strips. Dressing was then applied, the patient was awakened from general anesthesia and transported to the recovery room in stable condition. At the end of the procedure all sponge, needle and instrument counts were correct.   PLAN OF CARE: admit to inpatient  PATIENT DISPOSITION:  PACU - hemodynamically stable.   Delay start of Pharmacological VTE agent (>24hrs) due to surgical blood loss or risk of bleeding:  yes

## 2016-09-25 NOTE — Anesthesia Postprocedure Evaluation (Signed)
Anesthesia Post Note  Patient: Beth [name]  Procedure(s) Performed: Procedure(s) (LRB): PLIF - LUMBAR FOUR-LUMBAR FIVE (N/A)     Patient location during evaluation: PACU Anesthesia Type: General Level of consciousness: awake and alert Pain management: pain level controlled Vital Signs Assessment: post-procedure vital signs reviewed and stable Respiratory status: spontaneous breathing, nonlabored ventilation, respiratory function stable and patient connected to nasal cannula oxygen Cardiovascular status: blood pressure returned to baseline and stable Postop Assessment: no signs of nausea or vomiting Anesthetic complications: no    Last Vitals:  Vitals:   09/25/16 1600 09/25/16 1615  BP: 105/69   Pulse: 93 92  Resp: 14 12  Temp:      Last Pain:  Vitals:   09/25/16 0918  TempSrc: Oral  PainSc: 1                  Shemika Robbs,W. EDMOND

## 2016-09-25 NOTE — Anesthesia Preprocedure Evaluation (Addendum)
Anesthesia Evaluation  Patient identified by MRN, date of birth, ID band Patient awake    Reviewed: Allergy & Precautions, H&P , Patient's Chart, lab work & pertinent test results, reviewed documented beta blocker date and time   Airway Mallampati: II  TM Distance: >3 FB Neck ROM: full    Dental no notable dental hx. (+) Teeth Intact, Dental Advisory Given   Pulmonary sleep apnea , COPD, Current Smoker,    Pulmonary exam normal breath sounds clear to auscultation       Cardiovascular hypertension, Pt. on medications  Rhythm:regular Rate:Normal     Neuro/Psych    GI/Hepatic   Endo/Other  diabetes, Type 2Morbid obesity  Renal/GU      Musculoskeletal   Abdominal   Peds  Hematology   Anesthesia Other Findings   Reproductive/Obstetrics                            Anesthesia Physical Anesthesia Plan  ASA: III  Anesthesia Plan: General   Post-op Pain Management:    Induction: Intravenous  PONV Risk Score and Plan: 2 and Ondansetron, Dexamethasone, Midazolam, Propofol and Treatment may vary due to age or medical condition  Airway Management Planned: Oral ETT  Additional Equipment:   Intra-op Plan:   Post-operative Plan: Extubation in OR  Informed Consent: I have reviewed the patients History and Physical, chart, labs and discussed the procedure including the risks, benefits and alternatives for the proposed anesthesia with the patient or authorized representative who has indicated his/her understanding and acceptance.   Dental Advisory Given  Plan Discussed with: CRNA and Surgeon  Anesthesia Plan Comments: (  )        Anesthesia Quick Evaluation

## 2016-09-25 NOTE — Progress Notes (Signed)
Orthopedic Tech Progress Note Patient Details:  Beth [name] 1950-05-21 [id number] Patient has brace. Patient ID: Beth [name], female   DOB: 1950/06/04, 66 y.o.   MRN: [id number]   Braulio Bosch 09/25/2016, 5:58 PM

## 2016-09-25 NOTE — Transfer of Care (Signed)
Immediate Anesthesia Transfer of Care Note  Patient: Beth [name]  Procedure(s) Performed: Procedure(s): PLIF - LUMBAR FOUR-LUMBAR FIVE (N/A)  Patient Location: PACU  Anesthesia Type:General  Level of Consciousness: awake, oriented and patient cooperative  Airway & Oxygen Therapy: Patient Spontanous Breathing and Patient connected to face mask oxygen  Post-op Assessment: Report given to RN and Post -op Vital signs reviewed and stable  Post vital signs: Reviewed  Last Vitals:  Vitals:   09/25/16 0918 09/25/16 1434  BP: 109/71   Pulse: 87   Resp: 18   Temp: 36.8 C 36.7 C    Last Pain:  Vitals:   09/25/16 0918  TempSrc: Oral  PainSc: 1          Complications: No apparent anesthesia complications

## 2016-09-25 NOTE — H&P (Signed)
Subjective: Patient is a 66 y.o. female admitted for PLIF. Onset of symptoms was several months ago, gradually worsening since that time.  The pain is rated severe, unremitting, and is located at the across the lower back and radiates to legs. The pain is described as aching and occurs all day. The symptoms have been progressive. Symptoms are exacerbated by exercise. MRI or CT showed spondylolisthesis L4-5 with stenosis   Past Medical History:  Diagnosis Date  . Anemia   . Anxiety   . Bell's palsy 01/1985  . COPD (chronic obstructive pulmonary disease) ([place])   . Depression   . Diabetes mellitus without complication ([place])   . GERD (gastroesophageal reflux disease)   . Hypertension   . IBS (irritable bowel syndrome) 12/89  . Obesity, Class III, BMI 40-49.9 (morbid obesity) ([place])   . OSA (obstructive sleep apnea)    no cpap  . Vitamin D deficiency     Past Surgical History:  Procedure Laterality Date  . BLADDER SUSPENSION  03/2006, 07/2006, 01/2007   for stress incontinence  . COLONOSCOPY  05/2005   Normal, recheck 10 yrs  . JOINT REPLACEMENT     x3 lt knee  . TONSILLECTOMY AND ADENOIDECTOMY    . TOTAL KNEE ARTHROPLASTY Left 2012   x 2 left knee  . TOTAL KNEE REVISION  2014    Prior to Admission medications   Medication Sig Start Date End Date Taking? Authorizing Provider  ACCU-CHEK AVIVA PLUS test strip USE AS DIRECTED 06/29/13  Yes Kelby Aline, PA-C  albuterol (PROVENTIL HFA;VENTOLIN HFA) 108 (90 Base) MCG/ACT inhaler Inhale 2 puffs into the lungs every 6 (six) hours as needed for wheezing. 07/13/16  Yes [name], MD  ALPRAZolam Duanne Moron) 0.25 MG tablet Take 1/2 to 1 tablet 2 x / day if needed for anxiety. Patient taking differently: Take 0.25 mg by mouth 2 (two) times daily.  09/18/15  Yes [name], MD  aspirin 81 MG tablet Take 81 mg by mouth at bedtime.    Yes [provider]  baclofen (LIORESAL) 10 MG tablet TAKE 1 TABLET BY MOUTH TWICE DAILY 08/28/16   Yes Vicie Mutters, PA-C  cyclobenzaprine (FLEXERIL) 10 MG tablet TAKE 1/2 TO 1 TABLET BY  MOUTH 3 TIMES A DAY ONLY IF NEEDED FOR MUSCLE SPASM Patient taking differently: Take 10mg s by mouth daily at night, may take an additional 10 mg daily as needed for muscle spasms 09/07/16  Yes [name], MD  diclofenac (CATAFLAM) 50 MG tablet Take 50 mg by mouth 2 (two) times daily. 09/07/16  Yes [provider]  docusate sodium (COLACE) 100 MG capsule Take 100 mg by mouth daily as needed for mild constipation.   Yes [provider]  ferrous sulfate 325 (65 FE) MG tablet Take 325 mg by mouth daily.   Yes [provider]  fexofenadine (ALLEGRA) 180 MG tablet Take 180 mg by mouth at bedtime.    Yes [provider]  gabapentin (NEURONTIN) 600 MG tablet Take 1/2 to 1 tablet 3 x /day for chronic pain Patient taking differently: Take 600 mg by mouth 2 (two) times daily.  07/13/16 01/13/17 Yes [name], MD  hyoscyamine (LEVBID) 0.375 MG 12 hr tablet TAKE 1 TABLET BY MOUTH TWICE DAILY Patient taking differently: TAKE 1 TABLET BY MOUTH ONCE DAILY 05/17/16  Yes [name], MD  Magnesium 250 MG TABS Take 250 mg by mouth daily.    Yes [provider]  Melatonin 10 MG TABS Take 10 mg  by mouth at bedtime.   Yes [provider]  metFORMIN (GLUCOPHAGE-XR) 500 MG 24 hr tablet TAKE 1 TABLET BY MOUTH AT BREAKFAST AND LUNCH AND 2 BY MOUTH AT SUPPER Patient taking differently: Take 1000mg s by mouth twice daily 07/31/16  Yes [name], MD  metoCLOPramide (REGLAN) 10 MG tablet TAKE 1 TABLET BY MOUTH FOUR TIMES DAILY BEFORE A MEAL AND EVERY NIGHT AT BEDTIME FOR ACID REFLUX Patient taking differently: Take 10 mg by mouth at bedtime.  07/20/16  Yes [name], MD  montelukast (SINGULAIR) 10 MG tablet TAKE 1 TABLET BY MOUTH EVERY DAY 08/26/16  Yes [name], MD  olmesartan-hydrochlorothiazide (BENICAR HCT) 40-12.5 MG tablet TAKE 1 TABLET BY MOUTH ONCE  DAILY Patient taking differently: TAKE 0.5 TABLET BY MOUTH ONCE DAILY 02/16/16  Yes [name], MD  pantoprazole (PROTONIX) 40 MG tablet TAKE 1 TABLET BY MOUTH  DAILY FOR ACID INDIGESTION 05/21/16  Yes [name], MD  Phenylephrine-Guaifenesin (MUCUS RELIEF PE PO) Take 1 tablet by mouth 2 (two) times daily.   Yes [provider]  simvastatin (ZOCOR) 40 MG tablet TAKE 1 TABLET BY MOUTH  EVERY EVENING 08/04/16  Yes [name], MD  traMADol (ULTRAM) 50 MG tablet Take 50 mg by mouth daily as needed for pain. 09/03/16  Yes [provider]  venlafaxine XR (EFFEXOR-XR) 150 MG 24 hr capsule Take 150 mg by mouth twice daily 12/06/14  Yes [provider]  zaleplon (SONATA) 10 MG capsule Take 10 mg by mouth at bedtime as needed for sleep.    Yes [provider]  fluticasone (FLONASE) 50 MCG/ACT nasal spray Place 2 sprays into the nose daily as needed for allergies.     [provider]  meloxicam (MOBIC) 15 MG tablet TAKE 1 TABLET BY MOUTH EVERY DAY AFTER A MEAL AS NEEDED FOR PAIN AND INFLAMMATION Patient not taking: Reported on 09/16/2016 06/28/16   [name], MD   Allergies  Allergen Reactions  . Penicillins Anaphylaxis and Rash    Childhood allergy Has patient had a PCN reaction causing immediate rash, facial/tongue/throat swelling, SOB or lightheadedness with hypotension: Yes Has patient had a PCN reaction causing severe rash involving mucus membranes or skin necrosis: No Has patient had a PCN reaction that required hospitalization: No Has patient had a PCN reaction occurring within the last 10 years: No If all of the above answers are "NO", then may proceed with Cephalosporin use.   . Sulfa Antibiotics Shortness Of Breath    Also light headed, rash  . Celebrex [Celecoxib]     Elevates Blood Pressure   . Ciprofloxacin Nausea And Vomiting  . Codeine Nausea And Vomiting    And hydrocodone  . Levaquin [Levofloxacin] Nausea And  Vomiting  . Motrin Ib [Ibuprofen] Hives  . Prempro [Conj Estrog-Medroxyprogest Ace] Hives    Social History  Substance Use Topics  . Smoking status: Current Every Day Smoker    Packs/day: 0.50    Years: 40.00    Types: Cigarettes  . Smokeless tobacco: Never Used  . Alcohol use Yes     Comment: yearly    Family History  Problem Relation Age of Onset  . Hypertension Father   . Diabetes Father   . Parkinson's disease Father   . Dementia Mother   . CAD Paternal Grandfather 29       Died MI     Review of Systems  Positive ROS: neg  All other systems have been reviewed and were otherwise negative with  the exception of those mentioned in the HPI and as above.  Objective: Vital signs in last 24 hours: Temp:  [98.2 F (36.8 C)] 98.2 F (36.8 C) (07/20 0918) Pulse Rate:  [87] 87 (07/20 0918) Resp:  [18] 18 (07/20 0918) BP: (109)/(71) 109/71 (07/20 0918) SpO2:  [100 %] 100 % (07/20 0918) Weight:  [112.3 kg (247 lb 9.6 oz)] 112.3 kg (247 lb 9.6 oz) (07/20 0918)  General Appearance: Alert, cooperative, no distress, appears stated age Head: Normocephalic, without obvious abnormality, atraumatic Eyes: PERRL, conjunctiva/corneas clear, EOM's intact    Neck: Supple, symmetrical, trachea midline Back: Symmetric, no curvature, ROM normal, no CVA tenderness Lungs:  respirations unlabored Heart: Regular rate and rhythm Abdomen: Soft, non-tender Extremities: Extremities normal, atraumatic, no cyanosis or edema Pulses: 2+ and symmetric all extremities Skin: Skin color, texture, turgor normal, no rashes or lesions  NEUROLOGIC:   Mental status: Alert and oriented x4,  no aphasia, good attention span, fund of knowledge, and memory Motor Exam - grossly normal Sensory Exam - grossly normal Reflexes: trace Coordination - grossly normal Gait - grossly normal Balance - grossly normal Cranial Nerves: I: smell Not tested  II: visual acuity  OS: nl    OD: nl  II: visual fields Full to  confrontation  II: pupils Equal, round, reactive to light  III,VII: ptosis None  III,IV,VI: extraocular muscles  Full ROM  V: mastication Normal  V: facial light touch sensation  Normal  V,VII: corneal reflex  Present  VII: facial muscle function - upper  Normal  VII: facial muscle function - lower Normal  VIII: hearing Not tested  IX: soft palate elevation  Normal  IX,X: gag reflex Present  XI: trapezius strength  5/5  XI: sternocleidomastoid strength 5/5  XI: neck flexion strength  5/5  XII: tongue strength  Normal    Data Review Lab Results  Component Value Date   WBC 7.2 09/21/2016   HGB 12.1 09/21/2016   HCT 37.2 09/21/2016   MCV 84.5 09/21/2016   PLT 264 09/21/2016   Lab Results  Component Value Date   NA 138 09/21/2016   K 4.1 09/21/2016   CL 102 09/21/2016   CO2 26 09/21/2016   BUN 15 09/21/2016   CREATININE 0.85 09/21/2016   GLUCOSE 78 09/21/2016   Lab Results  Component Value Date   INR 0.96 09/21/2016    Assessment/Plan: Patient admitted for PLIF L4-5. Patient has failed a reasonable attempt at conservative therapy.  I explained the condition and procedure to the patient and answered any questions.  Patient wishes to proceed with procedure as planned. Understands risks/ benefits and typical outcomes of procedure.   Demarqus Jocson S 09/25/2016 10:25 AM

## 2016-09-25 NOTE — Anesthesia Procedure Notes (Signed)
Procedure Name: Intubation Date/Time: 09/25/2016 11:16 AM Performed by: Jenne Campus Pre-anesthesia Checklist: Patient identified, Emergency Drugs available, Suction available, Patient being monitored and Timeout performed Patient Re-evaluated:Patient Re-evaluated prior to induction Oxygen Delivery Method: Circle system utilized Preoxygenation: Pre-oxygenation with 100% oxygen Induction Type: IV induction Ventilation: Mask ventilation without difficulty Laryngoscope Size: Mac and 3 Grade View: Grade II Tube type: Oral Tube size: 7.0 mm Number of attempts: 1 Airway Equipment and Method: Stylet Placement Confirmation: ETT inserted through vocal cords under direct vision,  positive ETCO2,  CO2 detector and breath sounds checked- equal and bilateral Secured at: 21 cm Tube secured with: Tape Dental Injury: Teeth and Oropharynx as per pre-operative assessment

## 2016-09-26 LAB — GLUCOSE, CAPILLARY: GLUCOSE-CAPILLARY: 89 mg/dL (ref 65–99)

## 2016-09-26 MED ORDER — OXYCODONE HCL 5 MG PO TABS: 5.0000 mg | ORAL_TABLET | ORAL | 0 refills | Status: DC | PRN

## 2016-09-26 MED ORDER — METHOCARBAMOL 500 MG PO TABS: 500.0000 mg | ORAL_TABLET | Freq: Four times a day (QID) | ORAL | 1 refills | Status: DC | PRN

## 2016-09-26 NOTE — Evaluation (Signed)
Physical Therapy Evaluation Patient Details Name: Beth [name] MRN: [id number] DOB: 1951/01/26 Today's Date: 09/26/2016   History of Present Illness  66 yo admitted for L4-5 PLIF. PMHx: obesity, anxiety, COPD, DM, HTN, IBS  Clinical Impression  Pt very pleasant and eager to mobilize. Pt educated for precautions and mobility with handout provided. Pt with decreased strength, transfers and gait who will benefit from acute therapy to maximize mobility, function and independence.     Follow Up Recommendations Home health PT;Supervision - Intermittent    Equipment Recommendations  None recommended by PT    Recommendations for Other Services       Precautions / Restrictions Precautions Precautions: Fall;Back Precaution Booklet Issued: Yes (comment) Required Braces or Orthoses: Spinal Brace Spinal Brace: Lumbar corset;Applied in sitting position Restrictions Weight Bearing Restrictions: No      Mobility  Bed Mobility Overal bed mobility: Needs Assistance Bed Mobility: Rolling;Sidelying to Sit Rolling: Supervision Sidelying to sit: Supervision       General bed mobility comments: cues for sequence  Transfers Overall transfer level: Needs assistance   Transfers: Sit to/from Stand Sit to Stand: Supervision         General transfer comment: cues for sequence  Ambulation/Gait Ambulation/Gait assistance: Min guard Ambulation Distance (Feet): 400 Feet Assistive device: Rolling walker (2 wheeled) Gait Pattern/deviations: Step-through pattern;Decreased stride length;Trunk flexed   Gait velocity interpretation: Below normal speed for age/gender General Gait Details: pt with mod cues for posture and position in RW  Stairs Stairs: Yes Stairs assistance: Min assist Stair Management: Forwards;With cane Number of Stairs: 1 General stair comments: HHA with cane with RLE to ascend, LLE to descend  Wheelchair Mobility    Modified Rankin (Stroke Patients Only)        Balance Overall balance assessment: Needs assistance   Sitting balance-Leahy Scale: Good       Standing balance-Leahy Scale: Fair                               Pertinent Vitals/Pain Pain Assessment: 0-10 Pain Score: 5  Pain Descriptors / Indicators: Aching Pain Intervention(s): Limited activity within patient's tolerance;Repositioned;Monitored during session    Home Living Family/patient expects to be discharged to:: Private residence Living Arrangements: Alone Available Help at Discharge: Family;Available 24 hours/day (for 4 days) Type of Home: House Home Access: Stairs to enter Entrance Stairs-Rails: None Entrance Stairs-Number of Steps: 1 Home Layout: One level Home Equipment: Walker - 2 wheels;Cane - single point;Toilet riser;Shower seat;Other (comment);Adaptive equipment (bed rail)      Prior Function Level of Independence: Independent               Hand Dominance        Extremity/Trunk Assessment   Upper Extremity Assessment Upper Extremity Assessment: Generalized weakness    Lower Extremity Assessment Lower Extremity Assessment: Generalized weakness    Cervical / Trunk Assessment Cervical / Trunk Assessment: Kyphotic  Communication   Communication: No difficulties  Cognition Arousal/Alertness: Awake/alert Behavior During Therapy: WFL for tasks assessed/performed Overall Cognitive Status: Within Functional Limits for tasks assessed                                        General Comments      Exercises     Assessment/Plan    PT Assessment Patient needs continued PT services  PT Problem  List Decreased strength;Decreased mobility;Decreased activity tolerance;Decreased knowledge of use of DME;Decreased balance;Pain       PT Treatment Interventions Gait training;Patient/family education;Therapeutic exercise;Functional mobility training;DME instruction;Therapeutic activities    PT Goals (Current goals can be  found in the Care Plan section)  Acute Rehab PT Goals Patient Stated Goal: play with grandkids and shop PT Goal Formulation: With patient Time For Goal Achievement: 10/03/16 Potential to Achieve Goals: Good    Frequency Min 5X/week   Barriers to discharge Decreased caregiver support assist for only a few days    Co-evaluation               AM-PAC PT "6 Clicks" Daily Activity  Outcome Measure Difficulty turning over in bed (including adjusting bedclothes, sheets and blankets)?: A Little Difficulty moving from lying on back to sitting on the side of the bed? : Total Difficulty sitting down on and standing up from a chair with arms (e.g., wheelchair, bedside commode, etc,.)?: A Little Help needed moving to and from a bed to chair (including a wheelchair)?: None Help needed walking in hospital room?: A Little Help needed climbing 3-5 steps with a railing? : A Little 6 Click Score: 17    End of Session Equipment Utilized During Treatment: Back brace Activity Tolerance: Patient tolerated treatment well Patient left: Other (comment) (in bathroom for OT) Nurse Communication: Mobility status;Precautions PT Visit Diagnosis: Difficulty in walking, not elsewhere classified (R26.2);Other abnormalities of gait and mobility (R26.89);History of falling (Z91.81)    Time: 7035-0093 PT Time Calculation (min) (ACUTE ONLY): 28 min   Charges:   PT Evaluation $PT Eval Moderate Complexity: 1 Procedure PT Treatments $Gait Training: 8-22 mins   PT G Codes:        Elwyn Reach, PT [phone]   [place] 09/26/2016, 9:07 AM

## 2016-09-26 NOTE — Discharge Summary (Signed)
Physician Discharge Summary  Patient ID: Beth [name] MRN: [id number] DOB/AGE: 06-22-1950 66 y.o.  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  She was mobilized with PT and discharged home on POD 1.  Consults: None  Significant Diagnostic Studies: None  Treatments: surgery: decompression and fusion L 45 level  Discharge Exam: Blood pressure 101/66, pulse 92, temperature 98.1 F (36.7 C), temperature source Oral, resp. rate 18, height 5\' 4"  (1.626 m), weight 247 lb 9.6 oz (112.3 kg), last menstrual period 04/09/2009, SpO2 94 %. Neurologic: Alert and oriented X 3, normal strength and tone. Normal symmetric reflexes. Normal coordination and gait Wound:CDI  Disposition: Home  Discharge Instructions    Diet - low sodium heart healthy    Complete by:  As directed    Increase activity slowly    Complete by:  As directed      Allergies as of 09/26/2016      Reactions   Penicillins Anaphylaxis, Rash   Childhood allergy Has patient had a PCN reaction causing immediate rash, facial/tongue/throat swelling, SOB or lightheadedness with hypotension: Yes Has patient had a PCN reaction causing severe rash involving mucus membranes or skin necrosis: No Has patient had a PCN reaction that required hospitalization: No Has patient had a PCN reaction occurring within the last 10 years: No If all of the above answers are "NO", then may proceed with Cephalosporin use.   Sulfa Antibiotics Shortness Of Breath   Also light headed, rash   Celebrex [celecoxib]    Elevates Blood Pressure   Ciprofloxacin Nausea And Vomiting   Codeine Nausea And Vomiting   And hydrocodone   Levaquin [levofloxacin] Nausea  And Vomiting   Motrin Ib [ibuprofen] Hives   Prempro [conj Estrog-medroxyprogest Ace] Hives      Medication List    STOP taking these medications   diclofenac 50 MG tablet Commonly known as:  CATAFLAM   meloxicam 15 MG tablet Commonly known as:  MOBIC     TAKE these medications   ACCU-CHEK AVIVA PLUS test strip Generic drug:  glucose blood USE AS DIRECTED   albuterol 108 (90 Base) MCG/ACT inhaler Commonly known as:  PROVENTIL HFA;VENTOLIN HFA Inhale 2 puffs into the lungs every 6 (six) hours as needed for wheezing.   ALPRAZolam 0.25 MG tablet Commonly known as:  XANAX Take 1/2 to 1 tablet 2 x / day if needed for anxiety. What changed:  how much to take  how to take this  when to take this  additional instructions   aspirin 81 MG tablet Take 81 mg by mouth at bedtime.   baclofen 10 MG tablet Commonly known as:  LIORESAL TAKE 1 TABLET BY MOUTH TWICE DAILY   cyclobenzaprine 10 MG tablet Commonly known as:  FLEXERIL TAKE 1/2 TO 1 TABLET BY  MOUTH 3 TIMES A DAY ONLY IF NEEDED FOR MUSCLE SPASM What changed:  See the new instructions.   docusate sodium 100 MG capsule Commonly known as:  COLACE Take 100 mg by mouth daily as needed for mild constipation.   ferrous sulfate 325 (65 FE) MG tablet Take 325 mg by mouth daily.   fexofenadine 180 MG tablet Commonly known as:  ALLEGRA Take 180 mg by mouth at bedtime.   fluticasone  50 MCG/ACT nasal spray Commonly known as:  FLONASE Place 2 sprays into the nose daily as needed for allergies.   gabapentin 600 MG tablet Commonly known as:  NEURONTIN Take 1/2 to 1 tablet 3 x /day for chronic pain What changed:  how much to take  how to take this  when to take this  additional instructions   hyoscyamine 0.375 MG 12 hr tablet Commonly known as:  LEVBID TAKE 1 TABLET BY MOUTH TWICE DAILY What changed:  See the new instructions.   Magnesium 250 MG Tabs Take 250 mg by mouth daily.   Melatonin 10 MG Tabs Take  10 mg by mouth at bedtime.   metFORMIN 500 MG 24 hr tablet Commonly known as:  GLUCOPHAGE-XR TAKE 1 TABLET BY MOUTH AT BREAKFAST AND LUNCH AND 2 BY MOUTH AT SUPPER What changed:  See the new instructions.   methocarbamol 500 MG tablet Commonly known as:  ROBAXIN Take 1 tablet (500 mg total) by mouth every 6 (six) hours as needed for muscle spasms.   metoCLOPramide 10 MG tablet Commonly known as:  REGLAN TAKE 1 TABLET BY MOUTH FOUR TIMES DAILY BEFORE A MEAL AND EVERY NIGHT AT BEDTIME FOR ACID REFLUX What changed:  how much to take  how to take this  when to take this  additional instructions   montelukast 10 MG tablet Commonly known as:  SINGULAIR TAKE 1 TABLET BY MOUTH EVERY DAY   MUCUS RELIEF PE PO Take 1 tablet by mouth 2 (two) times daily.   olmesartan-hydrochlorothiazide 40-12.5 MG tablet Commonly known as:  BENICAR HCT TAKE 1 TABLET BY MOUTH ONCE DAILY What changed:  See the new instructions.   oxyCODONE 5 MG immediate release tablet Commonly known as:  Oxy IR/ROXICODONE Take 1-2 tablets (5-10 mg total) by mouth every 4 (four) hours as needed for breakthrough pain.   pantoprazole 40 MG tablet Commonly known as:  PROTONIX TAKE 1 TABLET BY MOUTH  DAILY FOR ACID INDIGESTION   simvastatin 40 MG tablet Commonly known as:  ZOCOR TAKE 1 TABLET BY MOUTH  EVERY EVENING   traMADol 50 MG tablet Commonly known as:  ULTRAM Take 50 mg by mouth daily as needed for pain.   venlafaxine XR 150 MG 24 hr capsule Commonly known as:  EFFEXOR-XR Take 150 mg by mouth twice daily   zaleplon 10 MG capsule Commonly known as:  SONATA Take 10 mg by mouth at bedtime as needed for sleep.            Durable Medical Equipment        Start     Ordered   09/25/16 1737  DME Walker rolling  Once    Question:  Patient needs a walker to treat with the following condition  Answer:  S/P lumbar fusion   09/25/16 1736   09/25/16 1737  DME 3 n 1  Once     09/25/16 1736        Signed: Peggyann Shoals, MD 09/26/2016, 7:09 AM

## 2016-09-26 NOTE — Progress Notes (Signed)
Patient is discharged from room 3C10 at this time. Alert and in stable condition. IV site d/c'd and instructions read to patient with understanding verbalized. Left unit via wheelchair with sister and all belongings at side.

## 2016-09-26 NOTE — Discharge Instructions (Signed)
Wound Care Keep incision covered and dry for 3 days    You may remove outer bandage after 3 days. Do not put any creams, lotions, or ointments on incision. Leave steri-strips on back.  They will fall off by themselves. Activity Walk each and every day, increasing distance each day. No lifting greater than 8 lbs.  Avoid excessive neck motion. No driving for 2 weeks; may ride as a passenger locally.  Diet Resume your normal soft diet.  Return to Work Will be discussed at you follow up appointment. Call Your Doctor If Any of These Occur Redness, drainage, or swelling at the wound.  Temperature greater than 101 degrees. Severe pain not relieved by pain medication. Increased difficulty swallowing.  Incision starts to come apart. Follow Up Appt Call today for appointment in 1-2 weeks ([phone]) or for problems.  If you have any hardware placed in your spine, you will need an x-ray before your appointment.

## 2016-09-26 NOTE — Care Management Note (Signed)
Case Management Note  Patient Details  Name: Beth [name] MRN: [id number] Date of Birth: [birth year]  Subjective/Objective:   Pt to be discharged today with HHPT, has used [facility] in the past and has checked with her Ins co, and found she has $10.00 co-pay.  Jermaine aware. No DME needs.                 Action/Plan: CM will sign off for now but will be available should additional discharge needs arise or disposition change.    Expected Discharge Date:  09/26/16               Expected Discharge Plan:     In-House Referral:     Discharge planning Services     Post Acute Care Choice:    Choice offered to:     DME Arranged:    DME Agency:     HH Arranged:    HH Agency:     Status of Service:     If discussed at [employer], dates discussed:    Additional Comments:  Delrae Sawyers, RN 09/26/2016, 10:00 AM

## 2016-09-26 NOTE — Progress Notes (Signed)
Subjective: Patient reports feeling OK  Objective: Vital signs in last 24 hours: Temp:  [98.1 F (36.7 C)-98.6 F (37 C)] 98.1 F (36.7 C) (07/21 0334) Pulse Rate:  [87-103] 92 (07/21 0334) Resp:  [11-18] 18 (07/21 0334) BP: (90-109)/(59-71) 101/66 (07/21 0334) SpO2:  [93 %-100 %] 94 % (07/21 0334) Weight:  [247 lb 9.6 oz (112.3 kg)] 247 lb 9.6 oz (112.3 kg) (07/20 0918)  Intake/Output from previous day: 07/20 0730 - 07/21 0729 In: 1300 [I.V.:1300] Out: 1000 [Urine:800; Blood:200] Intake/Output this shift: Total I/O In: 1300 [I.V.:1300] Out: 1000 [Urine:800; Blood:200]  Physical Exam: Strength full.  Dressing CDI.  Lab Results: No results for input(s): WBC, HGB, HCT, PLT in the last 72 hours. BMET No results for input(s): NA, K, CL, CO2, GLUCOSE, BUN, CREATININE, CALCIUM in the last 72 hours.  Studies/Results: Dg Lumbar Spine 2-3 Views  Result Date: 09/25/2016 CLINICAL DATA:  Post L4-L5 PLIF EXAM: DG C-ARM 61-120 MIN; LUMBAR SPINE - 2-3 VIEW FLUOROSCOPY TIME:  1 minutes, 8 seconds COMPARISON:  Lumbar spine MRI - 04/16/2016 FINDINGS: 2 spot intraoperative fluoroscopic images of lower lumbar spine are provided for review Spinal labeling is in keeping with preprocedural lumbar spine MRI. Provided images demonstrate the sequela of L4-L5 paraspinal fusion and intervertebral disc space replacement. Interval restoration of the L4-L5 intervertebral disc space with improved alignment of L4-L5 without definitive residual anterolisthesis. Regional soft tissues appear normal. No radiopaque foreign body. IMPRESSION: Post L4-L5 paraspinal fusion and intervertebral disc space replacement without evidence of hardware failure or loosening. Electronically Signed   By: Sandi Mariscal M.D.   On: 09/25/2016 15:05   Dg C-arm 1-60 Min  Result Date: 09/25/2016 CLINICAL DATA:  Post L4-L5 PLIF EXAM: DG C-ARM 61-120 MIN; LUMBAR SPINE - 2-3 VIEW FLUOROSCOPY TIME:  1 minutes, 8 seconds COMPARISON:  Lumbar  spine MRI - 04/16/2016 FINDINGS: 2 spot intraoperative fluoroscopic images of lower lumbar spine are provided for review Spinal labeling is in keeping with preprocedural lumbar spine MRI. Provided images demonstrate the sequela of L4-L5 paraspinal fusion and intervertebral disc space replacement. Interval restoration of the L4-L5 intervertebral disc space with improved alignment of L4-L5 without definitive residual anterolisthesis. Regional soft tissues appear normal. No radiopaque foreign body. IMPRESSION: Post L4-L5 paraspinal fusion and intervertebral disc space replacement without evidence of hardware failure or loosening. Electronically Signed   By: Sandi Mariscal M.D.   On: 09/25/2016 15:05    Assessment/Plan: Patient is doing well  Discharge home.    LOS: 1 day    Peggyann Shoals, MD 09/26/2016, 7:08 AM

## 2016-09-26 NOTE — Evaluation (Signed)
Occupational Therapy Evaluation Patient Details Name: Beth [name] MRN: [id number] DOB: 02/09/1951 Today's Date: 09/26/2016    History of Present Illness 66 yo admitted for L4-5 PLIF. PMHx: obesity, anxiety, COPD, DM, HTN, IBS   Clinical Impression   PTA, pt was independent with ADL and functional mobility. She currently requires min assist for toileting hygiene, and LB ADL and supervision for toilet transfers. She additionally requires min assist to don/doff brace at this time and family demonstrates ability to provide the necessary assistance. Educated pt concerning safe use of DME for both shower and tub transfers and recommend use of tub bench with tub shower for initial bathing tasks post-acute D/C once cleared by MD. [name] pt and family concerning back precautions related to ADL including strategies for dressing, bathing, and toileting tasks to maximize independence and adherence to precautions. Additionally educated pt and family concerning brace wear schedule and need to wear clothing between brace and skin for skin protection. Pt and sister indicate understanding of all topics. She will have 24 hour assistance from family initially. Pt would benefit from continued OT services while admitted in order to maximize safety and independence with ADL tasks prior to D/C home.    Follow Up Recommendations  No OT follow up;Supervision/Assistance - 24 hour    Equipment Recommendations  3 in 1 bedside commode;Other (comment) Advertising account planner)    Recommendations for Other Services       Precautions / Restrictions Precautions Precautions: Fall;Back Precaution Booklet Issued: Yes (comment) Precaution Comments: Reviewed handout provided by PT Required Braces or Orthoses: Spinal Brace Spinal Brace: Lumbar corset;Applied in sitting position Restrictions Weight Bearing Restrictions: No      Mobility Bed Mobility Overal bed mobility: Needs Assistance Bed Mobility: Rolling;Sidelying to  Sit Rolling: Supervision Sidelying to sit: Supervision       General bed mobility comments: VC's for log roll technique.   Transfers Overall transfer level: Needs assistance   Transfers: Sit to/from Stand Sit to Stand: Supervision         General transfer comment: cues for sequence    Balance Overall balance assessment: Needs assistance Sitting-balance support: No upper extremity supported;Feet supported Sitting balance-Leahy Scale: Good     Standing balance support: Bilateral upper extremity supported;During functional activity;No upper extremity supported Standing balance-Leahy Scale: Fair Standing balance comment: Able to statically stand without UE support but reliant on B UE support for dynamic standing tasks.                            ADL either performed or assessed with clinical judgement   ADL Overall ADL's : Needs assistance/impaired Eating/Feeding: Set up;Sitting   Grooming: Supervision/safety;Standing   Upper Body Bathing: Supervision/ safety;Sitting   Lower Body Bathing: Minimal assistance;Sit to/from stand   Upper Body Dressing : Minimal assistance;Sitting Upper Body Dressing Details (indicate cue type and reason): to don brace Lower Body Dressing: Minimal assistance;Sit to/from stand   Toilet Transfer: Supervision/safety;Ambulation;RW;BSC   Toileting- Clothing Manipulation and Hygiene: Minimal assistance;Sit to/from stand   Tub/ Shower Transfer: Minimal assistance;Walk-in shower;Ambulation;Shower Dealer Details (indicate cue type and reason): Educated pt on both shower transfer with shower seat and tub transfer with tub bench. Feel pt is safest with tub transfer with bench.  Functional mobility during ADLs: Supervision/safety;Rolling walker General ADL Comments: Educated pt at length concerning use of DME for showering and use of AE for LB ADL and toilet hygiene. Educated pt concerning compensatory  strategies to maximize independence and adherence to back precautions during ADL tasks.      Vision Patient Visual Report: No change from baseline Vision Assessment?: No apparent visual deficits     Perception     Praxis      Pertinent Vitals/Pain Pain Assessment: 0-10 Pain Score: 5  Pain Location: incision site Pain Descriptors / Indicators: Aching Pain Intervention(s): Limited activity within patient's tolerance;Monitored during session;Repositioned     Hand Dominance     Extremity/Trunk Assessment Upper Extremity Assessment Upper Extremity Assessment: Generalized weakness   Lower Extremity Assessment Lower Extremity Assessment: Generalized weakness   Cervical / Trunk Assessment Cervical / Trunk Assessment: Other exceptions Cervical / Trunk Exceptions: s/p spine surgery   Communication Communication Communication: No difficulties   Cognition Arousal/Alertness: Awake/alert Behavior During Therapy: WFL for tasks assessed/performed Overall Cognitive Status: Within Functional Limits for tasks assessed                                     General Comments       Exercises     Shoulder Instructions      Home Living Family/patient expects to be discharged to:: Private residence Living Arrangements: Alone Available Help at Discharge: Family;Available 24 hours/day (for 4 days) Type of Home: House Home Access: Stairs to enter [employer] of Steps: 1 Entrance Stairs-Rails: None Home Layout: One level     Bathroom Shower/Tub: Tub/shower unit;Walk-in shower   Bathroom Toilet: Standard     Home Equipment: Environmental consultant - 2 wheels;Cane - single point;Toilet riser;Shower seat;Other (comment);Adaptive equipment;Tub bench (bed rail) Adaptive Equipment: Reacher        Prior Functioning/Environment Level of Independence: Independent                 OT Problem List: Decreased strength;Decreased range of motion;Decreased activity  tolerance;Impaired balance (sitting and/or standing);Decreased safety awareness;Decreased knowledge of use of DME or AE;Decreased knowledge of precautions;Pain      OT Treatment/Interventions: Self-care/ADL training;Therapeutic exercise;Energy conservation;DME and/or AE instruction;Therapeutic activities;Patient/family education;Balance training    OT Goals(Current goals can be found in the care plan section) Acute Rehab OT Goals Patient Stated Goal: play with grandkids and shop OT Goal Formulation: With patient/family Time For Goal Achievement: 10/10/16 Potential to Achieve Goals: Good ADL Goals Pt Will Perform Upper Body Dressing: with modified independence;sitting Pt Will Perform Lower Body Dressing: with modified independence;with adaptive equipment;sit to/from stand Pt Will Transfer to Toilet: with modified independence;ambulating;bedside commode (BSC over toilet) Pt Will Perform Toileting - Clothing Manipulation and hygiene: with modified independence;sit to/from stand;with adaptive equipment Pt Will Perform Tub/Shower Transfer: with modified independence;Tub transfer;tub bench;ambulating;rolling walker  OT Frequency: Min 2X/week   Barriers to D/C:            Co-evaluation              AM-PAC PT "6 Clicks" Daily Activity     Outcome Measure Help from another person eating meals?: None Help from another person taking care of personal grooming?: A Little Help from another person toileting, which includes using toliet, bedpan, or urinal?: A Little Help from another person bathing (including washing, rinsing, drying)?: A Little Help from another person to put on and taking off regular upper body clothing?: None Help from another person to put on and taking off regular lower body clothing?: A Little 6 Click Score: 20   End of Session Equipment Utilized During Treatment: Rolling walker;Back  brace Nurse Communication: Mobility status  Activity Tolerance: Patient tolerated  treatment well Patient left:  (ambulating in hallway with sister)  OT Visit Diagnosis: Unsteadiness on feet (R26.81);Other abnormalities of gait and mobility (R26.89);Pain Pain - part of body:  (back)                Time: [phone] (returned for additional time to educate family) OT Time Calculation (min): 53 min Charges:  OT General Charges $OT Visit: 1 Procedure OT Evaluation $OT Eval Moderate Complexity: 1 Procedure OT Treatments $Self Care/Home Management : 38-52 mins G-Codes:     Doristine Section, MS OTR/L  Pager: [phone]   Odelle Kosier A Celestia Duva 09/26/2016, 10:17 AM

## 2016-10-08 ENCOUNTER — Other Ambulatory Visit: Payer: Self-pay | Admitting: Internal Medicine

## 2016-10-24 NOTE — Progress Notes (Signed)
Assessment and Plan:   Hypertension - continue medications, DASH diet, exercise and monitor at home. Call if greater than 130/80.  -     CBC with Differential/Platelet -     BASIC METABOLIC PANEL WITH GFR -     Hepatic function panel -     TSH  Chronic obstructive pulmonary disease, unspecified COPD type ([employer]) Advised to stop smoking, will get CXR, continue meds.   Controlled type 2 diabetes mellitus with chronic kidney disease, without long-term current use of insulin, unspecified CKD stage ([place]) Discussed general issues about diabetes pathophysiology and management., Educational material distributed., Suggested low cholesterol diet., Encouraged aerobic exercise., Discussed foot care., Reminded to get yearly retinal exam. -     Hemoglobin A1c  Depression, major, in remission ([place]) Patient has been stable on meds for years, her psych is leaving, will follow up here and will refill meds PRN Take 0.25mg  of xanax BID, and effexor  Hyperlipidemia -continue medications, check lipids, decrease fatty foods, increase activity.  -     Lipid panel  Obesity, Class III, BMI 40-49.9 (morbid obesity) ([place]) - long discussion about weight loss, diet, and exercise  Medication management -     Magnesium  OSA and COPD overlap syndrome ([facility]) Continue CPAP, weight loss advised  Morbid Obesity with co morbidities - long discussion about weight loss, diet, and exercise  Chronic bilateral low back pain without sciatica Patient 1 month s/p lumbar fusion, will walking with walker, difficult for patient to leave the house, will do another 4-8 weeks of PT for strength  Diarrhea, unspecified type -     ondansetron (ZOFRAN) 8 MG tablet; 1/2-1 tablet as needed every 6 hours for vomiting/nausea - continue levsin, bland diet, stop metformin for now, if not better will get stool to rule out Cdiff - benign AB - call the office if any changes or go to ER if worse   Continue diet and meds as discussed.  Further disposition pending results of labs. Discussed med's effects and SE's.  Future Appointments Date Time Provider [place]  12/15/2016 2:45 PM Regina Eck, CNM [place] None  02/02/2017 2:30 PM [name], MD [facility] None  08/04/2017 10:00 AM [name], MD [facility] None     HPI 66 y.o. female  presents for 3 month follow up with hypertension, hyperlipidemia, diabetes and vitamin D. Her blood pressure has been controlled at home, today their BP is BP: 136/80  Had lumbar 4-5 fusion with Dr. Ronnald Ramp on 07/20, still walking with walker, having weakness in bilateral legs.  She has been having nausea, diarrhea, cramping x Thursday. 5-6 x a day diarrhea. Was vomiting but not anymore but has not been able to hold anything down. No one else sick around her. Diffuse AB pain. She has been taking levsin, protonix, promethazine that has helped some. No one else sick. No dark black stool, blood stool. No recent ABX use.  She does not workout. She denies chest pain, shortness of breath, dizziness.  She is on cholesterol medication and denies myalgias. Her cholesterol is at goal. The cholesterol last visit was:   Lab Results  Component Value Date   CHOL 149 07/13/2016   HDL 54 07/13/2016   LDLCALC 75 07/13/2016   TRIG 99 07/13/2016   CHOLHDL 2.8 07/13/2016   She has been working on diet and exercise for Diabetes, she is on bASA, ARB,  metformin and has done a good job with weight loss and bringing down her sugars, and denies  polydipsia and polyuria. Last A1C in the office was:  Lab Results  Component Value Date   HGBA1C 5.8 (H) 07/13/2016   Patient is on Vitamin D supplement. Lab Results  Component Value Date   VD25OH 59 07/13/2016   She is on xanax and effexor for anxiety/depression, controlled She is smoking still but she has been cutting back.  BMI is Body mass index is 38.74 kg/m., she is working on diet and exercise. Down 27 lbs from Jan.  Wt Readings from  Last 3 Encounters:  10/26/16 231 lb (104.8 kg)  09/26/16 247 lb (112 kg)  09/21/16 247 lb 9.6 oz (112.3 kg)    Current Medications:  Current Outpatient Prescriptions on File Prior to Visit  Medication Sig Dispense Refill  . ACCU-CHEK AVIVA PLUS test strip USE AS DIRECTED 300 each 6  . albuterol (PROVENTIL HFA;VENTOLIN HFA) 108 (90 Base) MCG/ACT inhaler Inhale 2 puffs into the lungs every 6 (six) hours as needed for wheezing. 18 g 1  . ALPRAZolam (XANAX) 0.25 MG tablet Take 1/2 to 1 tablet 2 x / day if needed for anxiety. (Patient taking differently: Take 0.25 mg by mouth 2 (two) times daily. ) 180 tablet 1  . aspirin 81 MG tablet Take 81 mg by mouth at bedtime.     . baclofen (LIORESAL) 10 MG tablet TAKE 1 TABLET BY MOUTH TWICE DAILY 180 tablet 0  . cyclobenzaprine (FLEXERIL) 10 MG tablet TAKE 1/2 TO 1 TABLET BY  MOUTH 3 TIMES A DAY ONLY IF NEEDED FOR MUSCLE SPASM (Patient taking differently: Take 10mg s by mouth daily at night, may take an additional 10 mg daily as needed for muscle spasms) 270 tablet 3  . docusate sodium (COLACE) 100 MG capsule Take 100 mg by mouth daily as needed for mild constipation.    . ferrous sulfate 325 (65 FE) MG tablet Take 325 mg by mouth daily.    . fexofenadine (ALLEGRA) 180 MG tablet Take 180 mg by mouth at bedtime.     . fluticasone (FLONASE) 50 MCG/ACT nasal spray Place 2 sprays into the nose daily as needed for allergies.     Marland Kitchen gabapentin (NEURONTIN) 600 MG tablet Take 1/2 to 1 tablet 3 x /day for chronic pain (Patient taking differently: Take 600 mg by mouth 2 (two) times daily. ) 270 tablet 1  . hyoscyamine (LEVBID) 0.375 MG 12 hr tablet TAKE 1 TABLET BY MOUTH TWICE DAILY 180 tablet 1  . Magnesium 250 MG TABS Take 250 mg by mouth daily.     . Melatonin 10 MG TABS Take 10 mg by mouth at bedtime.    . metFORMIN (GLUCOPHAGE-XR) 500 MG 24 hr tablet TAKE 1 TABLET BY MOUTH AT BREAKFAST AND LUNCH AND 2 BY MOUTH AT SUPPER (Patient taking differently: Take 1000mg s  by mouth twice daily) 360 tablet 1  . methocarbamol (ROBAXIN) 500 MG tablet Take 1 tablet (500 mg total) by mouth every 6 (six) hours as needed for muscle spasms. 60 tablet 1  . metoCLOPramide (REGLAN) 10 MG tablet TAKE 1 TABLET BY MOUTH FOUR TIMES DAILY BEFORE A MEAL AND EVERY NIGHT AT BEDTIME FOR ACID REFLUX (Patient taking differently: Take 10 mg by mouth at bedtime. ) 360 tablet 1  . montelukast (SINGULAIR) 10 MG tablet TAKE 1 TABLET BY MOUTH EVERY DAY 90 tablet 1  . olmesartan-hydrochlorothiazide (BENICAR HCT) 40-12.5 MG tablet TAKE 1 TABLET BY MOUTH ONCE DAILY (Patient taking differently: TAKE 0.5 TABLET BY MOUTH ONCE DAILY) 90 tablet 1  .  oxyCODONE (OXY IR/ROXICODONE) 5 MG immediate release tablet Take 1-2 tablets (5-10 mg total) by mouth every 4 (four) hours as needed for breakthrough pain. 60 tablet 0  . pantoprazole (PROTONIX) 40 MG tablet TAKE 1 TABLET BY MOUTH  DAILY FOR ACID INDIGESTION 90 tablet 3  . Phenylephrine-Guaifenesin (MUCUS RELIEF PE PO) Take 1 tablet by mouth 2 (two) times daily.    . simvastatin (ZOCOR) 40 MG tablet TAKE 1 TABLET BY MOUTH  EVERY EVENING 90 tablet 3  . traMADol (ULTRAM) 50 MG tablet Take 50 mg by mouth daily as needed for pain.  1  . venlafaxine XR (EFFEXOR-XR) 150 MG 24 hr capsule Take 150 mg by mouth twice daily  4  . zaleplon (SONATA) 10 MG capsule Take 10 mg by mouth at bedtime as needed for sleep.      No current facility-administered medications on file prior to visit.    Medical History:  Past Medical History:  Diagnosis Date  . Anemia   . Anxiety   . Bell's palsy 01/1985  . COPD (chronic obstructive pulmonary disease) ([place])   . Depression   . Diabetes mellitus without complication ([place])   . GERD (gastroesophageal reflux disease)   . Hypertension   . IBS (irritable bowel syndrome) 12/89  . Obesity, Class III, BMI 40-49.9 (morbid obesity) ([place])   . OSA (obstructive sleep apnea)    no cpap  . Vitamin D deficiency    Allergies:  Allergies   Allergen Reactions  . Penicillins Anaphylaxis and Rash    Childhood allergy Has patient had a PCN reaction causing immediate rash, facial/tongue/throat swelling, SOB or lightheadedness with hypotension: Yes Has patient had a PCN reaction causing severe rash involving mucus membranes or skin necrosis: No Has patient had a PCN reaction that required hospitalization: No Has patient had a PCN reaction occurring within the last 10 years: No If all of the above answers are "NO", then may proceed with Cephalosporin use.   . Sulfa Antibiotics Shortness Of Breath    Also light headed, rash  . Celebrex [Celecoxib]     Elevates Blood Pressure   . Ciprofloxacin Nausea And Vomiting  . Codeine Nausea And Vomiting    And hydrocodone  . Levaquin [Levofloxacin] Nausea And Vomiting  . Motrin Ib [Ibuprofen] Hives  . Prempro [Conj Estrog-Medroxyprogest Ace] Hives    Review of Systems  Constitutional: Negative.  Negative for chills, diaphoresis and fever.  HENT: Negative.   Respiratory: Negative.  Negative for cough and shortness of breath.   Cardiovascular: Negative.   Gastrointestinal: Positive for abdominal pain, diarrhea, heartburn, nausea and vomiting. Negative for blood in stool, constipation and melena.  Genitourinary: Negative.  Negative for dysuria, frequency, hematuria and urgency.  Musculoskeletal: Negative for back pain, falls, joint pain, myalgias and neck pain.  Neurological: Negative.  Negative for dizziness.  Psychiatric/Behavioral: Negative.      Family history- Review and unchanged Social history- Review and unchanged Physical Exam: BP 136/80   Pulse 94   Temp 98.6 F (37 C)   Resp 16   Ht 5' 4.75" (1.645 m)   Wt 231 lb (104.8 kg)   LMP 04/09/2009   SpO2 97%   BMI 38.74 kg/m  Wt Readings from Last 3 Encounters:  10/26/16 231 lb (104.8 kg)  09/26/16 247 lb (112 kg)  09/21/16 247 lb 9.6 oz (112.3 kg)   General Appearance: Well nourished, in no apparent  distress. Eyes: PERRLA, EOMs, conjunctiva no swelling or erythema Sinuses: no Frontal/maxillary tenderness  ENT/Mouth: Ext aud canals clear, TMs without erythema, bulging. No erythema, swelling, or exudate on post pharynx.  Tonsils not swollen or erythematous. Hearing normal.  Neck: Supple, thyroid normal.  Respiratory: Respiratory effort normal, diffuse decreased breath sounds without rales, wheezing, rhonchi,  or stridor.  Cardio: RRR with no MRGs. Brisk peripheral pulses with mild bilateral edema.  Abdomen: Soft, + BS, obese  diffuse tenderness, no guarding, rebound, hernias, masses. Lymphatics: Non tender without lymphadenopathy.  Musculoskeletal: Full ROM, 4/5 strength, walking with walker Skin: well healing vertical scar on lower back, still with on staple at the top, no discharge, warmth, swelling. Warm, dry without rashes, lesions, ecchymosis.  Neuro: Cranial nerves intact. No cerebellar symptoms.  Psych: Awake and oriented X 3, normal affect, Insight and Judgment appropriate.    Vicie Mutters, PA-C 2:51 PM [facility]

## 2016-10-26 ENCOUNTER — Ambulatory Visit (INDEPENDENT_AMBULATORY_CARE_PROVIDER_SITE_OTHER): Payer: 59 | Admitting: Physician Assistant

## 2016-10-26 ENCOUNTER — Encounter: Payer: Self-pay | Admitting: Physician Assistant

## 2016-10-26 VITALS — BP 136/80 | HR 94 | Temp 98.6°F | Resp 16 | Ht 64.75 in | Wt 231.0 lb

## 2016-10-26 DIAGNOSIS — J449 Chronic obstructive pulmonary disease, unspecified: Secondary | ICD-10-CM

## 2016-10-26 DIAGNOSIS — F325 Major depressive disorder, single episode, in full remission: Secondary | ICD-10-CM

## 2016-10-26 DIAGNOSIS — E1122 Type 2 diabetes mellitus with diabetic chronic kidney disease: Secondary | ICD-10-CM | POA: Diagnosis not present

## 2016-10-26 DIAGNOSIS — M545 Low back pain, unspecified: Secondary | ICD-10-CM

## 2016-10-26 DIAGNOSIS — G4733 Obstructive sleep apnea (adult) (pediatric): Secondary | ICD-10-CM | POA: Diagnosis not present

## 2016-10-26 DIAGNOSIS — R197 Diarrhea, unspecified: Secondary | ICD-10-CM | POA: Diagnosis not present

## 2016-10-26 DIAGNOSIS — I10 Essential (primary) hypertension: Secondary | ICD-10-CM

## 2016-10-26 DIAGNOSIS — Z79899 Other long term (current) drug therapy: Secondary | ICD-10-CM

## 2016-10-26 DIAGNOSIS — G8929 Other chronic pain: Secondary | ICD-10-CM

## 2016-10-26 MED ORDER — ONDANSETRON HCL 8 MG PO TABS: ORAL_TABLET | ORAL | 0 refills | Status: DC

## 2016-10-26 NOTE — Patient Instructions (Signed)
If not better we need to get stool samples If any worsening AB pain, can't hold things down, dizziness, shortness of breath, chest pain go to the ER  Diarrhea, Adult Diarrhea is frequent loose and watery bowel movements. Diarrhea can make you feel weak and cause you to become dehydrated. Dehydration can make you tired and thirsty, cause you to have a dry mouth, and decrease how often you urinate. Diarrhea typically lasts 2-3 days. However, it can last longer if it is a sign of something more serious. It is important to treat your diarrhea as told by your health care provider. Follow these instructions at home: Eating and drinking  Follow these recommendations as told by your health care provider:  Take an oral rehydration solution (ORS). This is a drink that is sold at pharmacies and retail stores.  Drink clear fluids, such as water, ice chips, diluted fruit juice, and low-calorie sports drinks.  Eat bland, easy-to-digest foods in small amounts as you are able. These foods include bananas, applesauce, rice, lean meats, toast, and crackers.  Avoid drinking fluids that contain a lot of sugar or caffeine, such as energy drinks, sports drinks, and soda.  Avoid alcohol.  Avoid spicy or fatty foods.  General instructions  Drink enough fluid to keep your urine clear or pale yellow.  Wash your hands often. If soap and water are not available, use hand sanitizer.  Make sure that all people in your household wash their hands well and often.  Take over-the-counter and prescription medicines only as told by your health care provider.  Rest at home while you recover.  Watch your condition for any changes.  Take a warm bath to relieve any burning or pain from frequent diarrhea episodes.  Keep all follow-up visits as told by your health care provider. This is important. Contact a health care provider if:  You have a fever.  Your diarrhea gets worse.  You have new symptoms.  You cannot  keep fluids down.  You feel light-headed or dizzy.  You have a headache  You have muscle cramps. Get help right away if:  You have chest pain.  You feel extremely weak or you faint.  You have bloody or black stools or stools that look like tar.  You have severe pain, cramping, or bloating in your abdomen.  You have trouble breathing or you are breathing very quickly.  Your heart is beating very quickly.  Your skin feels cold and clammy.  You feel confused.  You have signs of dehydration, such as: ? Dark urine, very little urine, or no urine. ? Cracked lips. ? Dry mouth. ? Sunken eyes. ? Sleepiness. ? Weakness. This information is not intended to replace advice given to you by your health care provider. Make sure you discuss any questions you have with your health care provider. Document Released: 02/13/2002 Document Revised: 07/04/2015 Document Reviewed: 10/30/2014 Elsevier Interactive Patient Education  2017 [employer].

## 2016-10-27 ENCOUNTER — Telehealth: Payer: Self-pay | Admitting: Internal Medicine

## 2016-10-27 LAB — HEMOGLOBIN A1C
EAG (MMOL/L): 6.2 (calc)
HEMOGLOBIN A1C: 5.5 %{Hb} (ref <5.7)
MEAN PLASMA GLUCOSE: 111 (calc)

## 2016-10-27 LAB — CBC WITH DIFFERENTIAL/PLATELET
BASOS ABS: 41 {cells}/uL (ref 0–200)
BASOS PCT: 0.7 %
EOS ABS: 112 {cells}/uL (ref 15–500)
Eosinophils Relative: 1.9 %
HCT: 35.6 % (ref 35.0–45.0)
Hemoglobin: 11.4 g/dL — ABNORMAL LOW (ref 11.7–15.5)
Lymphs Abs: 1263 cells/uL (ref 850–3900)
MCH: 26.7 pg — AB (ref 27.0–33.0)
MCHC: 32 g/dL (ref 32.0–36.0)
MCV: 83.4 fL (ref 80.0–100.0)
MONOS PCT: 7.7 %
MPV: 11 fL (ref 7.5–12.5)
Neutro Abs: 4030 cells/uL (ref 1500–7800)
Neutrophils Relative %: 68.3 %
PLATELETS: 228 10*3/uL (ref 140–400)
RBC: 4.27 10*6/uL (ref 3.80–5.10)
RDW: 14.4 % (ref 11.0–15.0)
TOTAL LYMPHOCYTE: 21.4 %
WBC mixed population: 454 cells/uL (ref 200–950)
WBC: 5.9 10*3/uL (ref 3.8–10.8)

## 2016-10-27 LAB — BASIC METABOLIC PANEL WITH GFR
BUN: 13 mg/dL (ref 7–25)
CHLORIDE: 104 mmol/L (ref 98–110)
CO2: 26 mmol/L (ref 20–32)
Calcium: 9.2 mg/dL (ref 8.6–10.4)
Creat: 0.88 mg/dL (ref 0.50–0.99)
GFR, Est African American: 81 mL/min/{1.73_m2} (ref >=60)
GFR, Est Non African American: 70 mL/min/{1.73_m2} (ref >=60)
GLUCOSE: 90 mg/dL (ref 65–99)
POTASSIUM: 3.9 mmol/L (ref 3.5–5.3)
SODIUM: 140 mmol/L (ref 135–146)

## 2016-10-27 LAB — HEPATIC FUNCTION PANEL
AG Ratio: 1.5 (calc) (ref 1.0–2.5)
ALKALINE PHOSPHATASE (APISO): 139 U/L — AB (ref 33–130)
ALT: 9 U/L (ref 6–29)
AST: 17 U/L (ref 10–35)
Albumin: 3.7 g/dL (ref 3.6–5.1)
BILIRUBIN DIRECT: 0.1 mg/dL (ref 0.0–0.2)
BILIRUBIN INDIRECT: 0.3 mg/dL (ref 0.2–1.2)
Globulin: 2.5 g/dL (calc) (ref 1.9–3.7)
TOTAL PROTEIN: 6.2 g/dL (ref 6.1–8.1)
Total Bilirubin: 0.4 mg/dL (ref 0.2–1.2)

## 2016-10-27 LAB — LIPID PANEL
Cholesterol: 167 mg/dL (ref <200)
HDL: 47 mg/dL — ABNORMAL LOW (ref >50)
LDL CHOLESTEROL (CALC): 100 mg/dL — AB
Non-HDL Cholesterol (Calc): 120 mg/dL (calc) (ref <130)
Total CHOL/HDL Ratio: 3.6 (calc) (ref <5.0)
Triglycerides: 100 mg/dL (ref <150)

## 2016-10-27 LAB — TSH: TSH: 2.3 m[IU]/L (ref 0.40–4.50)

## 2016-10-27 LAB — MAGNESIUM: Magnesium: 1.9 mg/dL (ref 1.5–2.5)

## 2016-10-27 NOTE — Telephone Encounter (Signed)
Clair Gulling, PT w/ [facility], called to advise he was discharging patient to outpatient physical therapy. Called back [place], [place], and advised per Vicie Mutters, extending PT for 4-8 wks for strengthening. Unable to ambulate safely w/o assistance. Left voicemail to call back.

## 2016-10-29 ENCOUNTER — Encounter: Payer: Self-pay | Admitting: Physician Assistant

## 2016-10-30 ENCOUNTER — Emergency Department (HOSPITAL_BASED_OUTPATIENT_CLINIC_OR_DEPARTMENT_OTHER)
Admission: EM | Admit: 2016-10-30 | Discharge: 2016-10-30 | Disposition: A | Discharge status: Home / Self Care | Payer: 59 | Attending: Emergency Medicine | Admitting: Emergency Medicine

## 2016-10-30 ENCOUNTER — Encounter (HOSPITAL_BASED_OUTPATIENT_CLINIC_OR_DEPARTMENT_OTHER): Payer: Self-pay | Admitting: Emergency Medicine

## 2016-10-30 DIAGNOSIS — E119 Type 2 diabetes mellitus without complications: Secondary | ICD-10-CM | POA: Diagnosis not present

## 2016-10-30 DIAGNOSIS — Z7984 Long term (current) use of oral hypoglycemic drugs: Secondary | ICD-10-CM | POA: Diagnosis not present

## 2016-10-30 DIAGNOSIS — Z7982 Long term (current) use of aspirin: Secondary | ICD-10-CM | POA: Insufficient documentation

## 2016-10-30 DIAGNOSIS — M6281 Muscle weakness (generalized): Secondary | ICD-10-CM | POA: Diagnosis not present

## 2016-10-30 DIAGNOSIS — I10 Essential (primary) hypertension: Secondary | ICD-10-CM | POA: Diagnosis not present

## 2016-10-30 DIAGNOSIS — Z96652 Presence of left artificial knee joint: Secondary | ICD-10-CM | POA: Diagnosis not present

## 2016-10-30 DIAGNOSIS — F17210 Nicotine dependence, cigarettes, uncomplicated: Secondary | ICD-10-CM | POA: Diagnosis not present

## 2016-10-30 DIAGNOSIS — N390 Urinary tract infection, site not specified: Secondary | ICD-10-CM | POA: Insufficient documentation

## 2016-10-30 DIAGNOSIS — R197 Diarrhea, unspecified: Secondary | ICD-10-CM

## 2016-10-30 DIAGNOSIS — Z79899 Other long term (current) drug therapy: Secondary | ICD-10-CM | POA: Insufficient documentation

## 2016-10-30 DIAGNOSIS — R110 Nausea: Secondary | ICD-10-CM

## 2016-10-30 DIAGNOSIS — J449 Chronic obstructive pulmonary disease, unspecified: Secondary | ICD-10-CM | POA: Insufficient documentation

## 2016-10-30 LAB — COMPREHENSIVE METABOLIC PANEL
ALBUMIN: 3.8 g/dL (ref 3.5–5.0)
ALK PHOS: 136 U/L — AB (ref 38–126)
ALT: 17 U/L (ref 14–54)
AST: 25 U/L (ref 15–41)
Anion gap: 12 (ref 5–15)
BILIRUBIN TOTAL: 0.4 mg/dL (ref 0.3–1.2)
BUN: 10 mg/dL (ref 6–20)
CALCIUM: 9.2 mg/dL (ref 8.9–10.3)
CO2: 25 mmol/L (ref 22–32)
CREATININE: 0.82 mg/dL (ref 0.44–1.00)
Chloride: 103 mmol/L (ref 101–111)
GFR calc non Af Amer: >60 mL/min (ref >60)
GLUCOSE: 80 mg/dL (ref 65–99)
Potassium: 3.7 mmol/L (ref 3.5–5.1)
SODIUM: 140 mmol/L (ref 135–145)
Total Protein: 6.9 g/dL (ref 6.5–8.1)

## 2016-10-30 LAB — CBC WITH DIFFERENTIAL/PLATELET
BASOS ABS: 0 10*3/uL (ref 0.0–0.1)
BASOS PCT: 0 %
EOS PCT: 4 %
Eosinophils Absolute: 0.2 10*3/uL (ref 0.0–0.7)
HEMATOCRIT: 34.8 % — AB (ref 36.0–46.0)
Hemoglobin: 11.3 g/dL — ABNORMAL LOW (ref 12.0–15.0)
Lymphocytes Relative: 24 %
Lymphs Abs: 1.2 10*3/uL (ref 0.7–4.0)
MCH: 26.8 pg (ref 26.0–34.0)
MCHC: 32.5 g/dL (ref 30.0–36.0)
MCV: 82.7 fL (ref 78.0–100.0)
MONO ABS: 0.6 10*3/uL (ref 0.1–1.0)
MONOS PCT: 11 %
NEUTROS ABS: 3.2 10*3/uL (ref 1.7–7.7)
Neutrophils Relative %: 61 %
PLATELETS: 206 10*3/uL (ref 150–400)
RBC: 4.21 MIL/uL (ref 3.87–5.11)
RDW: 15.3 % (ref 11.5–15.5)
WBC: 5.2 10*3/uL (ref 4.0–10.5)

## 2016-10-30 LAB — URINALYSIS, MICROSCOPIC (REFLEX)

## 2016-10-30 LAB — URINALYSIS, ROUTINE W REFLEX MICROSCOPIC
GLUCOSE, UA: NEGATIVE mg/dL
NITRITE: NEGATIVE
PH: 5.5 (ref 5.0–8.0)
PROTEIN: NEGATIVE mg/dL
Specific Gravity, Urine: 1.01 (ref 1.005–1.030)

## 2016-10-30 MED ORDER — SODIUM CHLORIDE 0.9 % IV BOLUS (SEPSIS)
1000.0000 mL | Freq: Once | INTRAVENOUS | Status: AC
  Administered 2016-10-30: 1000 mL via INTRAVENOUS

## 2016-10-30 MED ORDER — CEPHALEXIN 250 MG PO CAPS
500.0000 mg | ORAL_CAPSULE | Freq: Once | ORAL | Status: AC
  Administered 2016-10-30: 500 mg via ORAL
  Filled 2016-10-30: qty 2

## 2016-10-30 MED ORDER — ONDANSETRON HCL 8 MG PO TABS: ORAL_TABLET | ORAL | 0 refills | Status: DC

## 2016-10-30 MED ORDER — ONDANSETRON HCL 4 MG/2ML IJ SOLN
4.0000 mg | Freq: Once | INTRAMUSCULAR | Status: AC
  Administered 2016-10-30: 4 mg via INTRAVENOUS
  Filled 2016-10-30: qty 2

## 2016-10-30 MED ORDER — CEPHALEXIN 500 MG PO CAPS: 500.0000 mg | ORAL_CAPSULE | Freq: Two times a day (BID) | ORAL | 0 refills | Status: DC

## 2016-10-30 NOTE — ED Notes (Signed)
Given ginger ale 

## 2016-10-30 NOTE — ED Triage Notes (Signed)
Pt having N/V/D since last Thursday.  Pt states she was seen on Monday by PCP and was told if she doesn't improve, to go to ED for fluids.  Pt states she has NOT had any emesis or diarrhea for three days but nausea continues.  Pt states she has been unable to eat since last Thursday but has been drinking some.  NAD in triage.

## 2016-10-30 NOTE — ED Notes (Signed)
Pt unable to void at this time. 

## 2016-10-30 NOTE — ED Provider Notes (Signed)
[facility] DE[facility] Provider Note   CSN: [id number] Arrival date & time: 10/30/16  6073     History   Chief Complaint Chief Complaint  Patient presents with  . Nausea  . Follow-up    HPI Beth [name] is a 66 y.o. female.  The history is provided by the patient. No language interpreter was used.    Beth [name] is a 67 y.o. female who presents to the Emergency Department complaining of nausea.  A week ago she had severe nausea, vomiting, diarrhea. Over the last 4 days her vomiting and diarrhea have resolved but she has persistent nausea with poor appetite. Anytime she tries to eat solid food she develops severe nausea with dry heaves. She is able to drink fluids and is taking in about 2 cups of Pedialyte or Gatorade daily. She presents today because she continues to feel poorly and now has generalized weakness and fatigue. She denies any fevers, chest pain, abdominal pain. She does have urinary frequency. No dysuria.  She saw her PCP on Monday who prescribed Zofran. She does have partial improvement in her symptoms with this medication.  Past Medical History:  Diagnosis Date  . Anemia   . Anxiety   . Bell's palsy 01/1985  . COPD (chronic obstructive pulmonary disease) ([place])   . Depression   . Diabetes mellitus without complication ([place])   . GERD (gastroesophageal reflux disease)   . Hypertension   . IBS (irritable bowel syndrome) 12/89  . Obesity, Class III, BMI 40-49.9 (morbid obesity) ([place])   . OSA (obstructive sleep apnea)    no cpap  . Vitamin D deficiency     Patient Active Problem List   Diagnosis Date Noted  . S/P lumbar spinal fusion 09/25/2016  . Chronic obstructive pulmonary disease ([place]) 12/20/2014  . Fibroid uterus 12/20/2014  . Obesity, Class III, BMI 40-49.9 (morbid obesity) ([place]) 11/19/2014  . OSA and COPD overlap syndrome ([place]) 11/19/2014  . GERD  11/19/2014  . Hyperlipidemia 09/27/2013  . Medication management 09/27/2013  . 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 Vitamin D deficiency   . Compulsive tobacco user syndrome 06/16/2012  . Hypertension 06/02/2012  . Menopausal state 06/02/2012  . Cigarette smoker 06/02/2012  . Depression, major, in remission ([place]) 04/09/1988  . IBS (irritable bowel syndrome) 02/07/1988  . Bell's palsy 01/07/1985    Past Surgical History:  Procedure Laterality Date  . BACK SURGERY    . BLADDER SUSPENSION  03/2006, 07/2006, 01/2007   for stress incontinence  . COLONOSCOPY  05/2005   Normal, recheck 10 yrs  . JOINT REPLACEMENT     x3 lt knee  . TONSILLECTOMY AND ADENOIDECTOMY    . TOTAL KNEE ARTHROPLASTY Left 2012   x 2 left knee  . TOTAL KNEE REVISION  2014    OB History    Gravida Para Term Preterm AB Living   3 1 1   2 1    SAB TAB Ectopic Multiple Live Births     2             Home Medications    Prior to Admission medications   Medication Sig Start Date End Date Taking? Authorizing Provider  ACCU-CHEK AVIVA PLUS test strip USE AS DIRECTED 06/29/13   Kelby Aline, PA-C  albuterol (PROVENTIL HFA;VENTOLIN HFA) 108 (90 Base) MCG/ACT inhaler Inhale 2 puffs into the lungs every 6 (six) hours as needed for wheezing. 07/13/16   [name], MD  ALPRAZolam (  XANAX) 0.25 MG tablet Take 1/2 to 1 tablet 2 x / day if needed for anxiety. Patient taking differently: Take 0.25 mg by mouth 2 (two) times daily.  09/18/15   [name], MD  aspirin 81 MG tablet Take 81 mg by mouth at bedtime.     [provider]  baclofen (LIORESAL) 10 MG tablet TAKE 1 TABLET BY MOUTH TWICE DAILY 08/28/16   Vicie Mutters, PA-C  cephALEXin (KEFLEX) 500 MG capsule Take 1 capsule (500 mg total) by mouth 2 (two) times daily. 10/30/16   Quintella Reichert, MD  cyclobenzaprine (FLEXERIL) 10 MG tablet TAKE 1/2 TO 1 TABLET BY  MOUTH 3 TIMES A DAY ONLY IF NEEDED FOR MUSCLE SPASM Patient taking differently: Take 10mg s by mouth daily at night, may take an additional 10 mg daily as needed for muscle spasms  09/07/16   [name], MD  docusate sodium (COLACE) 100 MG capsule Take 100 mg by mouth daily as needed for mild constipation.    [provider]  ferrous sulfate 325 (65 FE) MG tablet Take 325 mg by mouth daily.    [provider]  fexofenadine (ALLEGRA) 180 MG tablet Take 180 mg by mouth at bedtime.     [provider]  fluticasone (FLONASE) 50 MCG/ACT nasal spray Place 2 sprays into the nose daily as needed for allergies.     [provider]  gabapentin (NEURONTIN) 600 MG tablet Take 1/2 to 1 tablet 3 x /day for chronic pain Patient taking differently: Take 600 mg by mouth 2 (two) times daily.  07/13/16 01/13/17  [name], MD  hyoscyamine (LEVBID) 0.375 MG 12 hr tablet TAKE 1 TABLET BY MOUTH TWICE DAILY 10/08/16   Vicie Mutters, PA-C  Magnesium 250 MG TABS Take 250 mg by mouth daily.     [provider]  Melatonin 10 MG TABS Take 10 mg by mouth at bedtime.    [provider]  metFORMIN (GLUCOPHAGE-XR) 500 MG 24 hr tablet TAKE 1 TABLET BY MOUTH AT BREAKFAST AND LUNCH AND 2 BY MOUTH AT SUPPER Patient taking differently: Take 1000mg s by mouth twice daily 07/31/16   [name], MD  methocarbamol (ROBAXIN) 500 MG tablet Take 1 tablet (500 mg total) by mouth every 6 (six) hours as needed for muscle spasms. 09/26/16   Erline Levine, MD  metoCLOPramide (REGLAN) 10 MG tablet TAKE 1 TABLET BY MOUTH FOUR TIMES DAILY BEFORE A MEAL AND EVERY NIGHT AT BEDTIME FOR ACID REFLUX Patient taking differently: Take 10 mg by mouth at bedtime.  07/20/16   [name], MD  montelukast (SINGULAIR) 10 MG tablet TAKE 1 TABLET BY MOUTH EVERY DAY 08/26/16   [name], MD  olmesartan-hydrochlorothiazide (BENICAR HCT) 40-12.5 MG tablet TAKE 1 TABLET BY MOUTH ONCE DAILY Patient taking differently: TAKE 0.5 TABLET BY MOUTH ONCE DAILY 02/16/16   [name], MD  ondansetron [facility]) 8 MG tablet 1/2-1 tablet as needed every 8 hours for  vomiting/nausea 10/30/16   Quintella Reichert, MD  oxyCODONE (OXY IR/ROXICODONE) 5 MG immediate release tablet Take 1-2 tablets (5-10 mg total) by mouth every 4 (four) hours as needed for breakthrough pain. 09/26/16   Erline Levine, MD  pantoprazole (PROTONIX) 40 MG tablet TAKE 1 TABLET BY MOUTH  DAILY FOR ACID INDIGESTION 05/21/16   [name], MD  Phenylephrine-Guaifenesin (MUCUS RELIEF PE PO) Take 1 tablet by mouth 2 (two) times daily.    [provider]  simvastatin (ZOCOR) 40 MG tablet TAKE 1 TABLET BY MOUTH  EVERY EVENING 08/04/16   [name], MD  traMADol (ULTRAM) 50 MG tablet Take 50 mg by mouth daily as needed for pain. 09/03/16   [provider]  venlafaxine XR (EFFEXOR-XR) 150 MG 24 hr capsule Take 150 mg by mouth twice daily 12/06/14   [provider]  zaleplon (SONATA) 10 MG capsule Take 10 mg by mouth at bedtime as needed for sleep.     [provider]    Family History Family History  Problem Relation Age of Onset  . Hypertension Father   . Diabetes Father   . Parkinson's disease Father   . Dementia Mother   . CAD Paternal Grandfather 29       Died MI    Social History Social History  Substance Use Topics  . Smoking status: Current Every Day Smoker    Packs/day: 0.50    Years: 40.00    Types: Cigarettes  . Smokeless tobacco: Never Used  . Alcohol use Yes     Comment: yearly     Allergies   Penicillins; Sulfa antibiotics; Celebrex [celecoxib]; Ciprofloxacin; Codeine; Levaquin [levofloxacin]; Motrin ib [ibuprofen]; and Prempro [conj estrog-medroxyprogest ace]   Review of Systems Review of Systems  All other systems reviewed and are negative.    Physical Exam Updated Vital Signs BP 123/75 (BP Location: Right Arm)   Pulse 90   Temp 98.5 F (36.9 C) (Oral)   Resp 18   Ht 5\' 5"  (1.651 m)   Wt 104.8 kg (231 lb)   LMP 04/09/2009   SpO2 100%   BMI 38.44 kg/m   Physical Exam  Constitutional: She is oriented to  person, place, and time. She appears well-developed and well-nourished.  HENT:  Head: Normocephalic and atraumatic.  Cardiovascular: Normal rate and regular rhythm.   No murmur heard. Pulmonary/Chest: Effort normal and breath sounds normal. No respiratory distress.  Abdominal: Soft. There is no rebound and no guarding.  Mild lower abdominal tenderness.  Musculoskeletal: She exhibits no edema or tenderness.  Neurological: She is alert and oriented to person, place, and time.  Skin: Skin is warm and dry.  Psychiatric: She has a normal mood and affect. Her behavior is normal.  Nursing note and vitals reviewed.    ED Treatments / Results  Labs (all labs ordered are listed, but only abnormal results are displayed) Labs Reviewed  COMPREHENSIVE METABOLIC PANEL - Abnormal; Notable for the following:       Result Value   Alkaline Phosphatase 136 (*)    All other components within normal limits  CBC WITH DIFFERENTIAL/PLATELET - Abnormal; Notable for the following:    Hemoglobin 11.3 (*)    HCT 34.8 (*)    All other components within normal limits  URINALYSIS, ROUTINE W REFLEX MICROSCOPIC - Abnormal; Notable for the following:    APPearance CLOUDY (*)    Hgb urine dipstick LARGE (*)    Bilirubin Urine SMALL (*)    Ketones, ur >80 (*)    Leukocytes, UA LARGE (*)    All other components within normal limits  URINALYSIS, MICROSCOPIC (REFLEX) - Abnormal; Notable for the following:    Bacteria, UA MANY (*)    Squamous Epithelial / LPF 6-30 (*)    All other components within normal limits  URINE CULTURE    EKG  EKG Interpretation None       Radiology No results found.  Procedures Procedures (including critical care time)  Medications Ordered in ED Medications  sodium chloride 0.9 % bolus 1,000  mL (0 mLs Intravenous Stopped 10/30/16 1151)  ondansetron (ZOFRAN) injection 4 mg (4 mg Intravenous Given 10/30/16 1035)  cephALEXin (KEFLEX) capsule 500 mg (500 mg Oral Given 10/30/16  1326)     Initial Impression / Assessment and Plan / ED Course  I have reviewed the triage vital signs and the nursing notes.  Pertinent labs & imaging results that were available during my care of the patient were reviewed by me and considered in my medical decision making (see chart for details).     Patient here for evaluation of nausea and malaise. She recently had vomiting and diarrhea but this has since resolved. She is nontoxic appearing on examination.  BMP demonstrates normal renal function and electrolytes. UA is concerning for UTI. Patient does have some urinary frequency currently, we will treat in setting of abnormal urinalysis and her symptoms. Discussed with patient home care for UTI, nausea. Discussed outpatient follow-up and return precautions.    Final Clinical Impressions(s) / ED Diagnoses   Final diagnoses:  Acute UTI  Nausea    New Prescriptions Discharge Medication List as of 10/30/2016  1:05 PM    START taking these medications   Details  cephALEXin (KEFLEX) 500 MG capsule Take 1 capsule (500 mg total) by mouth 2 (two) times daily., Starting Fri 10/30/2016, Print         Quintella Reichert, MD 10/30/16 1536

## 2016-11-01 LAB — URINE CULTURE

## 2016-11-02 ENCOUNTER — Telehealth: Payer: Self-pay | Admitting: Internal Medicine

## 2016-11-02 NOTE — Telephone Encounter (Signed)
Herbert Deaner, Physical Therapist with [facility] called to advise patient received fluids at an Urgent Care on 10-30-16 with mild improvement. She was dx UTI. Rx: Keflex 500 bid. Keflex has interaction with Metformin, Level 3. Patient states she has not taken Metformin, since August 17th,  due to lack of appetite. Clair Gulling states patient eating a minimal amount, and no significant  BM for 10 days. Patient has no distress.

## 2016-11-03 NOTE — Telephone Encounter (Signed)
Pt was informed DU:[facility] patient and let her know needs to do bland food diet and advancing food diet. White bread, broth, etc. Continue to push fluids. Okay to take keflex and can leave off metformin for now. Can try miralax for BM but without pain and with minimal PO intact will likely be okay. If any worsening AB pain, vomiting, weakness go to ER  Pt agreed & hung up.

## 2016-11-03 NOTE — Telephone Encounter (Signed)
Call patient and let her know needs to do bland food diet and advancing food diet. White bread, broth, etc. Continue to push fluids. Okay to take keflex and can leave off metformin for now. Can try miralax for BM but without pain and with minimal PO intact will likely be okay. If any worsening AB pain, vomiting, weakness go to ER

## 2016-11-11 ENCOUNTER — Other Ambulatory Visit: Payer: Self-pay | Admitting: Internal Medicine

## 2016-11-12 ENCOUNTER — Encounter: Payer: Self-pay | Admitting: Internal Medicine

## 2016-11-13 ENCOUNTER — Other Ambulatory Visit: Payer: Self-pay | Admitting: *Deleted

## 2016-11-13 ENCOUNTER — Encounter: Payer: Self-pay | Admitting: Internal Medicine

## 2016-11-13 MED ORDER — ONETOUCH DELICA LANCETS 33G MISC: 4 refills | Status: DC

## 2016-11-13 MED ORDER — GLUCOSE BLOOD VI STRP: ORAL_STRIP | 4 refills | Status: DC

## 2016-12-11 ENCOUNTER — Encounter: Payer: Self-pay | Admitting: Internal Medicine

## 2016-12-15 ENCOUNTER — Encounter: Payer: Self-pay | Admitting: Certified Nurse Midwife

## 2016-12-15 ENCOUNTER — Encounter: Payer: Self-pay | Admitting: Internal Medicine

## 2016-12-15 ENCOUNTER — Other Ambulatory Visit (HOSPITAL_COMMUNITY)
Admission: RE | Admit: 2016-12-15 | Discharge: 2016-12-15 | Disposition: A | Discharge status: Home / Self Care | Payer: 59 | Source: Ambulatory Visit | Attending: Obstetrics & Gynecology | Admitting: Obstetrics & Gynecology

## 2016-12-15 ENCOUNTER — Ambulatory Visit (INDEPENDENT_AMBULATORY_CARE_PROVIDER_SITE_OTHER): Payer: 59 | Admitting: Certified Nurse Midwife

## 2016-12-15 VITALS — BP 112/70 | HR 70 | Resp 16 | Ht 64.5 in | Wt 226.0 lb

## 2016-12-15 DIAGNOSIS — Z0000 Encounter for general adult medical examination without abnormal findings: Secondary | ICD-10-CM | POA: Diagnosis not present

## 2016-12-15 DIAGNOSIS — Z124 Encounter for screening for malignant neoplasm of cervix: Secondary | ICD-10-CM

## 2016-12-15 DIAGNOSIS — R350 Frequency of micturition: Secondary | ICD-10-CM | POA: Diagnosis not present

## 2016-12-15 DIAGNOSIS — J302 Other seasonal allergic rhinitis: Secondary | ICD-10-CM | POA: Insufficient documentation

## 2016-12-15 DIAGNOSIS — Z01419 Encounter for gynecological examination (general) (routine) without abnormal findings: Secondary | ICD-10-CM | POA: Diagnosis not present

## 2016-12-15 LAB — POCT URINALYSIS DIPSTICK
BILIRUBIN UA: NEGATIVE
Blood, UA: NEGATIVE
Glucose, UA: NEGATIVE
KETONES UA: NEGATIVE
Nitrite, UA: NEGATIVE
PH UA: 5 (ref 5.0–8.0)
PROTEIN UA: NEGATIVE
UROBILINOGEN UA: NEGATIVE U/dL — AB

## 2016-12-15 NOTE — Progress Notes (Signed)
66 y.o. [id number] Divorced  Caucasian Fe here for annual exam. Menopausal no HRT. Denies vaginal bleeding or vaginal dryness. Back surgery  July 20,18 and has felt better, but contracted flu like virus with diarrhea/vomiting, lost 30 pounds. Recovering from this now, but feeling better. Sees Dr. Melford Aase for aex and every 3 months and medication management for  Diabetes, hypertension, labs. Sees Psychiatrist for anxiety treatment. Some urinary frequency, no urgency. Denies pain with urination or odor. Patient was treated earlier in summer for UTI. Please check today. Continues with Pt for back recovering and uses walker for balance and muscle weakness. No other health issues today.  Patient's last menstrual period was 04/09/2009.          Sexually active: No.  The current method of family planning is post menopausal status.    Exercising: Yes.    exercise bike Smoker:  no  Health Maintenance: Pap:  12-05-13 neg HPV HR neg History of Abnormal Pap: no MMG:  11-28-15 category a density birads 2:neg Self Breast exams: yes Colonoscopy:  2014 polyps f/u 49yrs BMD:   2016 TDaP:  2017 Shingles: 2014 Pneumonia: 2015 Hep C and HIV: not done Labs: poct urine- wbc 1+   reports that she has been smoking Cigarettes.  She has a 20.00 pack-year smoking history. She has never used smokeless tobacco. She reports that she drinks alcohol. She reports that she does not use drugs.  Past Medical History:  Diagnosis Date  . Anemia   . Anxiety   . Bell's palsy 01/1985  . COPD (chronic obstructive pulmonary disease) ([place])   . Depression   . Diabetes mellitus without complication ([place])   . GERD (gastroesophageal reflux disease)   . Hypertension   . IBS (irritable bowel syndrome) 12/89  . Obesity, Class III, BMI 40-49.9 (morbid obesity) ([place])   . OSA (obstructive sleep apnea)    no cpap  . Vitamin D deficiency     Past Surgical History:  Procedure Laterality Date  . BACK SURGERY    . BLADDER SUSPENSION   03/2006, 07/2006, 01/2007   for stress incontinence  . COLONOSCOPY  05/2005   Normal, recheck 10 yrs  . JOINT REPLACEMENT     x3 lt knee  . TONSILLECTOMY AND ADENOIDECTOMY    . TOTAL KNEE ARTHROPLASTY Left 2012   x 2 left knee  . TOTAL KNEE REVISION  2014    Current Outpatient Prescriptions  Medication Sig Dispense Refill  . albuterol (PROVENTIL HFA;VENTOLIN HFA) 108 (90 Base) MCG/ACT inhaler Inhale 2 puffs into the lungs every 6 (six) hours as needed for wheezing. 18 g 1  . ALPRAZolam (XANAX) 0.25 MG tablet Take 1/2 to 1 tablet 2 x / day if needed for anxiety. (Patient taking differently: Take 0.25 mg by mouth 2 (two) times daily. ) 180 tablet 1  . aspirin 81 MG tablet Take 81 mg by mouth at bedtime.     . baclofen (LIORESAL) 10 MG tablet TAKE 1 TABLET BY MOUTH TWICE DAILY 180 tablet 0  . cephALEXin (KEFLEX) 500 MG capsule Take 1 capsule (500 mg total) by mouth 2 (two) times daily. 20 capsule 0  . cyclobenzaprine (FLEXERIL) 10 MG tablet TAKE 1/2 TO 1 TABLET BY  MOUTH 3 TIMES A DAY ONLY IF NEEDED FOR MUSCLE SPASM (Patient taking differently: Take 10mg s by mouth daily at night, may take an additional 10 mg daily as needed for muscle spasms) 270 tablet 3  . docusate sodium (COLACE) 100 MG capsule Take 100  mg by mouth daily as needed for mild constipation.    . ferrous sulfate 325 (65 FE) MG tablet Take 325 mg by mouth daily.    . fexofenadine (ALLEGRA) 180 MG tablet Take 180 mg by mouth at bedtime.     . fluticasone (FLONASE) 50 MCG/ACT nasal spray Place 2 sprays into the nose daily as needed for allergies.     Marland Kitchen gabapentin (NEURONTIN) 600 MG tablet Take 1/2 to 1 tablet 3 x /day for chronic pain (Patient taking differently: Take 600 mg by mouth 2 (two) times daily. ) 270 tablet 1  . glucose blood (ONETOUCH VERIO) test strip Check blood sugar 1 time daily-DX-E11.22. 100 each 4  . hyoscyamine (LEVBID) 0.375 MG 12 hr tablet TAKE 1 TABLET BY MOUTH TWICE DAILY 180 tablet 0  . Magnesium 250 MG TABS  Take 250 mg by mouth daily.     . Melatonin 10 MG TABS Take 10 mg by mouth at bedtime.    . metFORMIN (GLUCOPHAGE-XR) 500 MG 24 hr tablet TAKE 1 TABLET BY MOUTH AT BREAKFAST AND LUNCH AND 2 BY MOUTH AT SUPPER (Patient taking differently: Take 1000mg s by mouth twice daily) 360 tablet 1  . methocarbamol (ROBAXIN) 500 MG tablet Take 1 tablet (500 mg total) by mouth every 6 (six) hours as needed for muscle spasms. 60 tablet 1  . metoCLOPramide (REGLAN) 10 MG tablet TAKE 1 TABLET BY MOUTH FOUR TIMES DAILY BEFORE A MEAL AND EVERY NIGHT AT BEDTIME FOR ACID REFLUX (Patient taking differently: Take 10 mg by mouth at bedtime. ) 360 tablet 1  . montelukast (SINGULAIR) 10 MG tablet TAKE 1 TABLET BY MOUTH EVERY DAY 90 tablet 1  . olmesartan-hydrochlorothiazide (BENICAR HCT) 40-12.5 MG tablet TAKE 1 TABLET BY MOUTH ONCE DAILY (Patient taking differently: TAKE 0.5 TABLET BY MOUTH ONCE DAILY) 90 tablet 1  . ondansetron (ZOFRAN) 8 MG tablet 1/2-1 tablet as needed every 8 hours for vomiting/nausea 10 tablet 0  . ONETOUCH DELICA LANCETS 14E MISC Check blood sugar 1 time daily-DX-E11.22 100 each 4  . oxyCODONE (OXY IR/ROXICODONE) 5 MG immediate release tablet Take 1-2 tablets (5-10 mg total) by mouth every 4 (four) hours as needed for breakthrough pain. 60 tablet 0  . pantoprazole (PROTONIX) 40 MG tablet TAKE 1 TABLET BY MOUTH  DAILY FOR ACID INDIGESTION 90 tablet 3  . Phenylephrine-Guaifenesin (MUCUS RELIEF PE PO) Take 1 tablet by mouth 2 (two) times daily.    . simvastatin (ZOCOR) 40 MG tablet TAKE 1 TABLET BY MOUTH  EVERY EVENING 90 tablet 3  . traMADol (ULTRAM) 50 MG tablet Take 50 mg by mouth daily as needed for pain.  1  . venlafaxine XR (EFFEXOR-XR) 150 MG 24 hr capsule Take 150 mg by mouth twice daily  4  . zaleplon (SONATA) 10 MG capsule Take 10 mg by mouth at bedtime as needed for sleep.      No current facility-administered medications for this visit.     Family History  Problem Relation Age of Onset  .  Hypertension Father   . Diabetes Father   . Parkinson's disease Father   . Dementia Mother   . CAD Paternal Grandfather 32       Died MI    ROS:  Pertinent items are noted in HPI.  Otherwise, a comprehensive ROS was negative.  Exam:   LMP 04/09/2009    Ht Readings from Last 3 Encounters:  10/30/16 5\' 5"  (1.651 m)  10/26/16 5' 4.75" (1.645 m)  09/26/16  5\' 4"  (1.626 m)    General appearance: alert, cooperative and appears stated age Head: Normocephalic, without obvious abnormality, atraumatic Neck: no adenopathy, supple, symmetrical, trachea midline and thyroid normal to inspection and palpation Lungs: clear to auscultation bilaterally Breasts: normal appearance, no masses or tenderness, No nipple retraction or dimpling, No nipple discharge or bleeding, No axillary or supraclavicular adenopathy Heart: regular rate and rhythm Abdomen: soft, non-tender; no masses,  no organomegaly, negative suprapubic tenderness Extremities: extremities normal, atraumatic, no cyanosis or edema Skin: Skin color, texture, turgor normal. No rashes or lesions Lymph nodes: Cervical, supraclavicular, and axillary nodes normal. No abnormal inguinal nodes palpated Neurologic: Grossly normal   Pelvic: External genitalia:  no lesions              Urethra:  normal appearing urethra with no masses, tenderness or lesions  Bladder, urethral meatus non tender              Bartholin's and Skene's: normal                 Vagina: normal appearing vagina with normal color and discharge, no lesions              Cervix: no bleeding following Pap, no cervical motion tenderness, no lesions and retroverted              Pap taken: Yes.   Bimanual Exam:  Uterus:  normal size, contour, position, consistency, mobility, non-tender              Adnexa: normal adnexa and no mass, fullness, tenderness               Rectovaginal: Confirms               Anus:  normal sphincter tone, no lesions  Chaperone present: yes  A:   Well Woman with normal exam  Menopausal no HRT  R/O UTI  Back surgery still in PT  Hypertension/Diabetes with PCP management.  Psychiatrist management of anxiety medications  Smoker, declines cessation  P:   Reviewed health and wellness pertinent to exam  Aware of need to evaluate if vaginal bleeding  Reviewed UTI warning symptoms.  Lab Urine culture  Continue MD follow up as indicated  Pap smear: yes   counseled on breast self exam, mammography screening, adequate intake of calcium and vitamin D, diet and exercise  return annually or prn  An After Visit Summary was printed and given to the patient.

## 2016-12-15 NOTE — Patient Instructions (Signed)
EXERCISE AND DIET:  We recommended that you start or continue a regular exercise program for good health. Regular exercise means any activity that makes your heart beat faster and makes you sweat.  We recommend exercising at least 30 minutes per day at least 3 days a week, preferably 4 or 5.  We also recommend a diet low in fat and sugar.  Inactivity, poor dietary choices and obesity can cause diabetes, heart attack, stroke, and kidney damage, among others.    ALCOHOL AND SMOKING:  Women should limit their alcohol intake to no more than 7 drinks/beers/glasses of wine (combined, not each!) per week. Moderation of alcohol intake to this level decreases your risk of breast cancer and liver damage. And of course, no recreational drugs are part of a healthy lifestyle.  And absolutely no smoking or even second hand smoke. Most people know smoking can cause heart and lung diseases, but did you know it also contributes to weakening of your bones? Aging of your skin?  Yellowing of your teeth and nails?  CALCIUM AND VITAMIN D:  Adequate intake of calcium and Vitamin D are recommended.  The recommendations for exact amounts of these supplements seem to change often, but generally speaking 600 mg of calcium (either carbonate or citrate) and 800 units of Vitamin D per day seems prudent. Certain women may benefit from higher intake of Vitamin D.  If you are among these women, your doctor will have told you during your visit.    PAP SMEARS:  Pap smears, to check for cervical cancer or precancers,  have traditionally been done yearly, although recent scientific advances have shown that most women can have pap smears less often.  However, every woman still should have a physical exam from her gynecologist every year. It will include a breast check, inspection of the vulva and vagina to check for abnormal growths or skin changes, a visual exam of the cervix, and then an exam to evaluate the size and shape of the uterus and  ovaries.  And after 64 years of age, a rectal exam is indicated to check for rectal cancers. We will also provide age appropriate advice regarding health maintenance, like when you should have certain vaccines, screening for sexually transmitted diseases, bone density testing, colonoscopy, mammograms, etc.   MAMMOGRAMS:  All women over 40 years old should have a yearly mammogram. Many facilities now offer a "3D" mammogram, which may cost around $50 extra out of pocket. If possible,  we recommend you accept the option to have the 3D mammogram performed.  It both reduces the number of women who will be called back for extra views which then turn out to be normal, and it is better than the routine mammogram at detecting truly abnormal areas.    COLONOSCOPY:  Colonoscopy to screen for colon cancer is recommended for all women at age 50.  We know, you hate the idea of the prep.  We agree, BUT, having colon cancer and not knowing it is worse!!  Colon cancer so often starts as a polyp that can be seen and removed at colonscopy, which can quite literally save your life!  And if your first colonoscopy is normal and you have no family history of colon cancer, most women don't have to have it again for 10 years.  Once every ten years, you can do something that may end up saving your life, right?  We will be happy to help you get it scheduled when you are ready.    Be sure to check your insurance coverage so you understand how much it will cost.  It may be covered as a preventative service at no cost, but you should check your particular policy.      Urinary Tract Infection, Adult A urinary tract infection (UTI) is an infection of any part of the urinary tract. The urinary tract includes the:  Kidneys.  Ureters.  Bladder.  Urethra.  These organs make, store, and get rid of pee (urine) in the body. Follow these instructions at home:  Take over-the-counter and prescription medicines only as told by your  doctor.  If you were prescribed an antibiotic medicine, take it as told by your doctor. Do not stop taking the antibiotic even if you start to feel better.  Avoid the following drinks: ? Alcohol. ? Caffeine. ? Tea. ? Carbonated drinks.  Drink enough fluid to keep your pee clear or pale yellow.  Keep all follow-up visits as told by your doctor. This is important.  Make sure to: ? Empty your bladder often and completely. Do not to hold pee for long periods of time. ? Empty your bladder before and after sex. ? Wipe from front to back after a bowel movement if you are female. Use each tissue one time when you wipe. Contact a doctor if:  You have back pain.  You have a fever.  You feel sick to your stomach (nauseous).  You throw up (vomit).  Your symptoms do not get better after 3 days.  Your symptoms go away and then come back. Get help right away if:  You have very bad back pain.  You have very bad lower belly (abdominal) pain.  You are throwing up and cannot keep down any medicines or water. This information is not intended to replace advice given to you by your health care provider. Make sure you discuss any questions you have with your health care provider. Document Released: 08/12/2007 Document Revised: 08/01/2015 Document Reviewed: 01/14/2015 Elsevier Interactive Patient Education  2018 [employer]  

## 2016-12-16 LAB — URINE CULTURE

## 2016-12-22 LAB — HM DIABETES EYE EXAM

## 2016-12-28 ENCOUNTER — Encounter: Payer: Self-pay | Admitting: Internal Medicine

## 2016-12-30 LAB — CYTOLOGY - PAP: DIAGNOSIS: NEGATIVE

## 2017-02-02 ENCOUNTER — Ambulatory Visit: Payer: 59 | Admitting: Internal Medicine

## 2017-02-02 VITALS — BP 128/82 | HR 88 | Temp 97.4°F | Resp 16 | Ht 64.5 in | Wt 234.4 lb

## 2017-02-02 DIAGNOSIS — E1122 Type 2 diabetes mellitus with diabetic chronic kidney disease: Secondary | ICD-10-CM

## 2017-02-02 DIAGNOSIS — N182 Chronic kidney disease, stage 2 (mild): Secondary | ICD-10-CM

## 2017-02-02 DIAGNOSIS — E559 Vitamin D deficiency, unspecified: Secondary | ICD-10-CM | POA: Diagnosis not present

## 2017-02-02 DIAGNOSIS — E782 Mixed hyperlipidemia: Secondary | ICD-10-CM

## 2017-02-02 DIAGNOSIS — Z79899 Other long term (current) drug therapy: Secondary | ICD-10-CM

## 2017-02-02 DIAGNOSIS — I10 Essential (primary) hypertension: Secondary | ICD-10-CM | POA: Diagnosis not present

## 2017-02-02 DIAGNOSIS — R7309 Other abnormal glucose: Secondary | ICD-10-CM

## 2017-02-02 NOTE — Patient Instructions (Signed)

## 2017-02-02 NOTE — Progress Notes (Signed)
This very nice 66 y.o. DWF presents for 6 month follow up with Hypertension, Hyperlipidemia, Pre-Diabetes and Vitamin D Deficiency. Patient is also followed  By Dr Elta Guadeloupe Phillips/Pain Mgmt for Chronic Pain syndrome consequent of Lumbar DDD. She's also followed by Dr Lynann Bologna & Mina Marble for EDSI's.      Patient is treated for HTN (2007) & BP has been controlled at home. Today's BP is at goal - 128/82. Patient has had no complaints of any cardiac type chest pain, palpitations, dyspnea / orthopnea / PND, dizziness, claudication, or dependent edema.     Hyperlipidemia is controlled with diet & meds. Patient denies myalgias or other med SE's. 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  Last A1c was normal at goal: Lab Results  Component Value Date   HGBA1C 5.5 10/26/2016      Further, the patient also has history of Vitamin D Deficiency ("13" /2008) and supplements vitamin D without any suspected side-effects. Last vitamin D was at goal:  Lab Results  Component Value Date   VD25OH 59 07/13/2016   Current Outpatient Medications on File Prior to Visit  Medication Sig  . albuterol (PROVENTIL HFA;VENTOLIN HFA) 108 (90 Base) MCG/ACT inhaler Inhale 2 puffs into the lungs every 6 (six) hours as needed for wheezing.  Marland Kitchen ALPRAZolam (XANAX) 0.25 MG tablet Take 1/2 to 1 tablet 2 x / day if needed for anxiety. (Patient taking differently: Take 0.25 mg by mouth 2 (two) times daily. )  . aspirin 81 MG tablet Take 81 mg by mouth at bedtime.   . cyclobenzaprine (FLEXERIL) 10 MG tablet TAKE 1/2 TO 1 TABLET BY  MOUTH 3 TIMES A DAY ONLY IF NEEDED FOR MUSCLE SPASM (Patient taking differently: Take 10mg s by  mouth daily at night, may take an additional 10 mg daily as needed for muscle spasms)  . docusate sodium (COLACE) 100 MG capsule Take 100 mg by mouth daily as needed for mild constipation.  . fexofenadine (ALLEGRA) 180 MG tablet Take 180 mg by mouth at bedtime.   . fluticasone (FLONASE) 50 MCG/ACT nasal spray Place 2 sprays into the nose daily as needed for allergies.   Marland Kitchen glucose blood (ONETOUCH VERIO) test strip Check blood sugar 1 time daily-[id number].  . hyoscyamine (LEVBID) 0.375 MG 12 hr tablet TAKE 1 TABLET BY MOUTH TWICE DAILY (Patient taking differently: daily)  . Melatonin 10 MG TABS Take 10 mg by mouth at bedtime.  . metoCLOPramide (REGLAN) 10 MG tablet TAKE 1 TABLET BY MOUTH FOUR TIMES DAILY BEFORE A MEAL AND EVERY NIGHT AT BEDTIME FOR ACID REFLUX (Patient taking differently: daily. )  . olmesartan-hydrochlorothiazide (BENICAR HCT) 40-12.5 MG tablet TAKE 1 TABLET BY MOUTH ONCE DAILY (Patient taking differently: TAKE 0.5 TABLET BY MOUTH ONCE DAILY)  . ONETOUCH DELICA LANCETS 32D MISC Check blood sugar 1 time daily-[id number]  . pantoprazole (PROTONIX) 40 MG tablet TAKE 1 TABLET BY MOUTH  DAILY FOR ACID INDIGESTION  . venlafaxine XR (EFFEXOR-XR) 150 MG 24 hr capsule Take 150 mg by mouth 2 (two) times daily. Patient gets RX from a different provider.  . zaleplon (SONATA) 10 MG capsule  Take 10 mg by mouth at bedtime as needed for sleep.   . Magnesium 250 MG TABS Take 250 mg by mouth daily.    No current facility-administered medications on file prior to visit.    Allergies  Allergen Reactions  . Penicillins Anaphylaxis and Rash    Childhood allergy Has patient had a PCN reaction causing immediate rash, facial/tongue/throat swelling, SOB or lightheadedness with hypotension: Yes Has patient had a PCN reaction causing severe rash involving mucus membranes or skin necrosis: No Has patient had a PCN reaction that required hospitalization: No Has patient had a PCN reaction occurring within  the last 10 years: No If all of the above answers are "NO", then may proceed with Cephalosporin use.   . Sulfa Antibiotics Shortness Of Breath    Also light headed, rash  . Celebrex [Celecoxib]     Elevates Blood Pressure   . Ciprofloxacin Nausea And Vomiting  . Codeine Nausea And Vomiting    And hydrocodone  . Levaquin [Levofloxacin] Nausea And Vomiting  . Motrin Ib [Ibuprofen] Hives  . Prempro [Conj Estrog-Medroxyprogest Ace] Hives   PMHx:   Past Medical History:  Diagnosis Date  . Anemia   . Anxiety   . Bell's palsy 01/1985  . COPD (chronic obstructive pulmonary disease) ([place])   . Depression   . Diabetes mellitus without complication ([place])   . GERD (gastroesophageal reflux disease)   . Hypertension   . IBS (irritable bowel syndrome) 12/89  . Obesity, Class III, BMI 40-49.9 (morbid obesity) ([place])   . OSA (obstructive sleep apnea)    no cpap  . Vitamin D deficiency    Immunization History  Administered Date(s) Administered  . DT 05/21/2015  . Influenza,inj,quad, With Preservative 12/09/2015  . Influenza-Unspecified 12/07/2016  . PPD Test 03/21/2013, 05/21/2015  . Pneumococcal Conjugate-13 01/04/2014  . Pneumococcal Polysaccharide-23 03/09/2004  . Td 03/09/2004  . Tdap 03/10/2015  . Typhoid Inactivated 05/10/2014  . Zoster 12/22/2012   Past Surgical History:  Procedure Laterality Date  . BACK SURGERY    . BLADDER SUSPENSION  03/2006, 07/2006, 01/2007   for stress incontinence  . COLONOSCOPY  05/2005   Normal, recheck 10 yrs  . JOINT REPLACEMENT     x3 lt knee  . TONSILLECTOMY AND ADENOIDECTOMY    . TOTAL KNEE ARTHROPLASTY Left 2012   x 2 left knee  . TOTAL KNEE REVISION  2014   FHx:    Reviewed / unchanged  SHx:    Reviewed / unchanged  Systems Review:  Constitutional: Denies fever, chills, wt changes, headaches, insomnia, fatigue, night sweats, change in appetite. Eyes: Denies redness, blurred vision, diplopia, discharge, itchy, watery eyes.  ENT:  Denies discharge, congestion, post nasal drip, epistaxis, sore throat, earache, hearing loss, dental pain, tinnitus, vertigo, sinus pain, snoring.  CV: Denies chest pain, palpitations, irregular heartbeat, syncope, dyspnea, diaphoresis, orthopnea, PND, claudication or edema. Respiratory: denies cough, dyspnea, DOE, pleurisy, hoarseness, laryngitis, wheezing.  Gastrointestinal: Denies dysphagia, odynophagia, heartburn, reflux, water brash, abdominal pain or cramps, nausea, vomiting, bloating, diarrhea, constipation, hematemesis, melena, hematochezia  or hemorrhoids. Genitourinary: Denies dysuria, frequency, urgency, nocturia, hesitancy, discharge, hematuria or flank pain. Musculoskeletal: Denies arthralgias, myalgias, stiffness, jt. swelling, pain, limping or strain/sprain.  Skin: Denies pruritus, rash, hives, warts, acne, eczema or change in skin lesion(s). Neuro: No weakness, tremor, incoordination, spasms, paresthesia or pain. Psychiatric: Denies confusion, memory loss or sensory loss. Endo: Denies change in weight, skin or hair change.  Heme/Lymph: No excessive bleeding, bruising or enlarged lymph  nodes.  Physical Exam  BP 128/82   Pulse 88   Temp (!) 97.4 F (36.3 C)   Resp 16   Ht 5' 4.5" (1.638 m)   Wt 234 lb 6.4 oz (106.3 kg)   LMP 04/09/2009   BMI 39.61 kg/m   Appears well nourished, well groomed  and in no distress.  Eyes: PERRLA, EOMs, conjunctiva no swelling or erythema. Sinuses: No frontal/maxillary tenderness ENT/Mouth: EAC's clear, TM's nl w/o erythema, bulging. Nares clear w/o erythema, swelling, exudates. Oropharynx clear without erythema or exudates. Oral hygiene is good. Tongue normal, non obstructing. Hearing intact.  Neck: Supple. Thyroid nl. Car 2+/2+ without bruits, nodes or JVD. Chest: Respirations nl with BS clear & equal w/o rales, rhonchi, wheezing or stridor.  Cor: Heart sounds normal w/ regular rate and rhythm without sig. murmurs, gallops, clicks or rubs.  Peripheral pulses normal and equal  without edema.  Abdomen: Soft & bowel sounds normal. Non-tender w/o guarding, rebound, hernias, masses or organomegaly.  Lymphatics: Unremarkable.  Musculoskeletal: Full ROM all peripheral extremities, joint stability, 5/5 strength and normal gait.  Skin: Warm, dry without exposed rashes, lesions or ecchymosis apparent.  Neuro: Cranial nerves intact, reflexes equal bilaterally. Sensory-motor testing grossly intact. Tendon reflexes grossly intact.  Pysch: Alert & oriented x 3.  Insight and judgement nl & appropriate. No ideations.  Assessment and Plan:  1. Hypertension  - Continue medication, monitor blood pressure at home.  - Continue DASH diet. Reminder to go to the ER if any CP,  SOB, nausea, dizziness, severe HA, changes vision/speech.  - CBC with Differential/Platelet - BASIC METABOLIC PANEL WITH GFR - Magnesium - TSH  2. Hyperlipidemia, mixed  - Continue diet/meds, exercise,& lifestyle modifications.  - Continue monitor periodic cholesterol/liver & renal functions   - Hepatic function panel - Lipid panel - TSH  3. Controlled type 2 diabetes mellitus with stage 2 chronic kidney disease, without long-term current use of insulin (HCC)  - Hemoglobin A1c - Insulin, random  4. Vitamin D deficiency  - Continue diet, exercise, lifestyle modifications.  - Monitor appropriate labs. - Continue supplementation. - VITAMIN D 25 Hydroxy  5. Abnormal glucose  - Hemoglobin A1c - Insulin, random  6. Medication management  - CBC with Differential/Platelet - BASIC METABOLIC PANEL WITH GFR - Hepatic function panel - Magnesium - Lipid panel - TSH - Hemoglobin A1c - Insulin, random - VITAMIN D 25 Hydroxy        Discussed  regular exercise, BP monitoring, weight control to achieve/maintain BMI less than 25 and discussed med and SE's. Recommended labs to assess and monitor clinical status with further disposition pending results of labs.  Over 30 minutes of exam, counseling, chart review was performed.

## 2017-02-03 LAB — HEPATIC FUNCTION PANEL
AG RATIO: 1.3 (calc) (ref 1.0–2.5)
ALBUMIN MSPROF: 3.8 g/dL (ref 3.6–5.1)
ALT: 15 U/L (ref 6–29)
AST: 22 U/L (ref 10–35)
Alkaline phosphatase (APISO): 201 U/L — ABNORMAL HIGH (ref 33–130)
BILIRUBIN TOTAL: 0.3 mg/dL (ref 0.2–1.2)
Bilirubin, Direct: 0 mg/dL (ref 0.0–0.2)
GLOBULIN: 3 g/dL (ref 1.9–3.7)
Indirect Bilirubin: 0.3 mg/dL (calc) (ref 0.2–1.2)
Total Protein: 6.8 g/dL (ref 6.1–8.1)

## 2017-02-03 LAB — CBC WITH DIFFERENTIAL/PLATELET
BASOS PCT: 0.6 %
Basophils Absolute: 38 cells/uL (ref 0–200)
Eosinophils Absolute: 359 cells/uL (ref 15–500)
Eosinophils Relative: 5.7 %
HEMATOCRIT: 35.2 % (ref 35.0–45.0)
Hemoglobin: 11.4 g/dL — ABNORMAL LOW (ref 11.7–15.5)
LYMPHS ABS: 1947 {cells}/uL (ref 850–3900)
MCH: 26.3 pg — ABNORMAL LOW (ref 27.0–33.0)
MCHC: 32.4 g/dL (ref 32.0–36.0)
MCV: 81.1 fL (ref 80.0–100.0)
MPV: 10.9 fL (ref 7.5–12.5)
Monocytes Relative: 7.4 %
NEUTROS ABS: 3490 {cells}/uL (ref 1500–7800)
Neutrophils Relative %: 55.4 %
PLATELETS: 272 10*3/uL (ref 140–400)
RBC: 4.34 10*6/uL (ref 3.80–5.10)
RDW: 15.7 % — ABNORMAL HIGH (ref 11.0–15.0)
Total Lymphocyte: 30.9 %
WBC: 6.3 10*3/uL (ref 3.8–10.8)
WBCMIX: 466 {cells}/uL (ref 200–950)

## 2017-02-03 LAB — BASIC METABOLIC PANEL WITH GFR
BUN: 15 mg/dL (ref 7–25)
CO2: 29 mmol/L (ref 20–32)
CREATININE: 0.78 mg/dL (ref 0.50–0.99)
Calcium: 9.3 mg/dL (ref 8.6–10.4)
Chloride: 102 mmol/L (ref 98–110)
GFR, Est African American: 93 mL/min/{1.73_m2} (ref >=60)
GFR, Est Non African American: 80 mL/min/{1.73_m2} (ref >=60)
GLUCOSE: 105 mg/dL — AB (ref 65–99)
Potassium: 3.7 mmol/L (ref 3.5–5.3)
SODIUM: 139 mmol/L (ref 135–146)

## 2017-02-03 LAB — LIPID PANEL
Cholesterol: 240 mg/dL — ABNORMAL HIGH (ref <200)
HDL: 61 mg/dL (ref >50)
LDL Cholesterol (Calc): 151 mg/dL (calc) — ABNORMAL HIGH
Non-HDL Cholesterol (Calc): 179 mg/dL (calc) — ABNORMAL HIGH (ref <130)
TRIGLYCERIDES: 149 mg/dL (ref <150)
Total CHOL/HDL Ratio: 3.9 (calc) (ref <5.0)

## 2017-02-03 LAB — TSH: TSH: 1.84 mIU/L (ref 0.40–4.50)

## 2017-02-03 LAB — HEMOGLOBIN A1C
EAG (MMOL/L): 6.8 (calc)
Hgb A1c MFr Bld: 5.9 % of total Hgb — ABNORMAL HIGH (ref <5.7)
MEAN PLASMA GLUCOSE: 123 (calc)

## 2017-02-03 LAB — VITAMIN D 25 HYDROXY (VIT D DEFICIENCY, FRACTURES): Vit D, 25-Hydroxy: 40 ng/mL (ref 30–100)

## 2017-02-03 LAB — HM DEXA SCAN: HM DEXA SCAN: NORMAL

## 2017-02-03 LAB — MAGNESIUM: Magnesium: 2 mg/dL (ref 1.5–2.5)

## 2017-02-03 LAB — INSULIN, RANDOM: INSULIN: 15.9 u[IU]/mL (ref 2.0–19.6)

## 2017-02-03 LAB — HM MAMMOGRAPHY

## 2017-02-04 ENCOUNTER — Encounter (INDEPENDENT_AMBULATORY_CARE_PROVIDER_SITE_OTHER): Payer: Self-pay

## 2017-02-07 ENCOUNTER — Encounter: Payer: Self-pay | Admitting: Internal Medicine

## 2017-02-09 ENCOUNTER — Encounter: Payer: Self-pay | Admitting: *Deleted

## 2017-03-18 ENCOUNTER — Other Ambulatory Visit: Payer: Self-pay | Admitting: Internal Medicine

## 2017-04-14 ENCOUNTER — Other Ambulatory Visit: Payer: Self-pay | Admitting: Internal Medicine

## 2017-04-22 ENCOUNTER — Other Ambulatory Visit: Payer: Self-pay | Admitting: Internal Medicine

## 2017-05-05 NOTE — Progress Notes (Signed)
FOLLOW UP  Assessment and Plan:   Hypertension Well controlled with current medications  Monitor blood pressure at home; patient to call if consistently greater than 130/80 Continue DASH diet.   Reminder to go to the ER if any CP, SOB, nausea, dizziness, severe HA, changes vision/speech, left arm numbness and tingling and jaw pain.  Cholesterol Currently near goal; continue statin Continue low cholesterol diet and exercise.  Check lipid panel.   Diabetes with diabetic chronic kidney disease Currently controlled in prediabetic range by diet/lifestyle only - discussed need for weight loss Continue diet and exercise.  Perform daily foot/skin check, notify office of any concerning changes.  Check A1C  Obesity with co morbidities Long discussion about weight loss, diet, and exercise Recommended diet heavy in fruits and veggies and low in animal meats, cheeses, and dairy products, appropriate calorie intake Discussed ideal weight for height  and initial weight goal (235 lb)  Patient will work on continue with cycling, work on cutting down liquid calories Will follow up in 3 months  Vitamin D Def At goal at last visit; continue supplementation to maintain goal of 70-100 Defer Vit D level  GERD Symptoms well managed without breakthrough Will try to get off PPI given info for taper and zantac sent in  Depression/anxiety Continue medications ; discussed benzo use and advised to avoid daily, use PRN as prescribed Lifestyle discussed: diet/exerise, sleep hygiene, stress management, hydration  Tobacco use Discussed risks associated with tobacco use and advised to reduce or quit Patient is not ready to do so, but advised to consider strongly Will follow up at the next visit  Continue diet and meds as discussed. Further disposition pending results of labs. Discussed med's effects and SE's.   Over 30 minutes of exam, counseling, chart review, and critical decision making was  performed.   Future Appointments  Date Time Provider [place]  08/04/2017 10:00 AM [name], MD [facility] None  12/16/2017  1:00 PM Regina Eck, CNM [place] None    ----------------------------------------------------------------------------------------------------------------------  HPI 67 y.o. female  presents for 3 month follow up on hypertension, cholesterol, diabetes, morbid obesity, GERD, depression and vitamin D deficiency. Pain syndrome consequent of Lumbar DDD and has had surgery by Dr. Sherley Bounds and reports she is doing very well.   she has a diagnosis of depression/anxiety currently in remission on medications (venlafaxine daily, 0.25 mg xanax BID PRN, reports symptoms are well controlled on current regimen. she takes the xanax 0.25 mg BID . She is also prescribed zaleplon 10 mg daily for sleep which she takes rarely. She is managed by a psych NP Pauline Good.   she currently continues to smoke 0.5 pack a day; discussed risks associated with smoking, patient is not ready to quit but is working on tapering down. She has COPD diagnosed by imaging currently asymptomatic without daily inhaler.   she has a diagnosis of GERD which is currently managed by protonix 40 mg daily she reports symptoms is currently well controlled, and denies breakthrough reflux, burning in chest, hoarseness or cough.    BMI is Body mass index is 40.9 kg/m., she has been working on diet and exercise. Cycles indoor daily. She reports carbs are a problem. Wt Readings from Last 3 Encounters:  05/06/17 242 lb (109.8 kg)  02/02/17 234 lb 6.4 oz (106.3 kg)  12/15/16 226 lb (102.5 kg)   Her blood pressure has been controlled at home, today their BP is BP: 110/70  She does workout. She denies  chest pain, shortness of breath, dizziness.   She is on cholesterol medication ( simvastatin 40 mg daily) and denies myalgias. Her cholesterol is not at goal of LDL <70.  The cholesterol last visit  was:   Lab Results  Component Value Date   CHOL 240 (H) 02/02/2017   HDL 61 02/02/2017   LDLCALC 75 07/13/2016   TRIG 149 02/02/2017   CHOLHDL 3.9 02/02/2017    She has been working on diet and exercise for T2DM currently controlled by diet alone, and denies foot ulcerations, increased appetite, nausea, paresthesia of the feet, polydipsia, polyuria, visual disturbances and vomiting. Checks sporadical fasting - running 90-100. Last A1C in the office was:  Lab Results  Component Value Date   HGBA1C 5.9 (H) 02/02/2017   Patient is on Vitamin D supplement despite repeated recommendation and remains below goal of 70:  Lab Results  Component Value Date   VD25OH 40 02/02/2017      Current Medications:  Current Outpatient Medications on File Prior to Visit  Medication Sig  . acetaminophen (TYLENOL) 650 MG CR tablet Take 650 mg by mouth every 8 (eight) hours as needed for pain.  Marland Kitchen albuterol (PROVENTIL HFA;VENTOLIN HFA) 108 (90 Base) MCG/ACT inhaler Inhale 2 puffs into the lungs every 6 (six) hours as needed for wheezing.  Marland Kitchen ALPRAZolam (XANAX) 0.25 MG tablet Take 1/2 to 1 tablet 2 x / day if needed for anxiety. (Patient taking differently: Take 0.25 mg by mouth 2 (two) times daily. )  . aspirin 81 MG tablet Take 81 mg by mouth at bedtime.   . Biotin 5000 MCG CAPS Take by mouth.  . Calcium Carbonate-Vit D-Min (CALCIUM 1200 PO) Take by mouth.  . Cholecalciferol (D3-1000) 1000 units capsule Take 1,000 Units by mouth daily.  Marland Kitchen CINNAMON PO Take 1,500 mg by mouth.  . cyclobenzaprine (FLEXERIL) 10 MG tablet TAKE 1/2 TO 1 TABLET BY  MOUTH 3 TIMES A DAY ONLY IF NEEDED FOR MUSCLE SPASM (Patient taking differently: Take 10mg s by mouth daily at night, may take an additional 10 mg daily as needed for muscle spasms)  . docusate sodium (COLACE) 100 MG capsule Take 100 mg by mouth daily as needed for mild constipation.  . fexofenadine (ALLEGRA) 180 MG tablet Take 180 mg by mouth at bedtime.   . fluticasone  (FLONASE) 50 MCG/ACT nasal spray Place 2 sprays into the nose daily as needed for allergies.   Marland Kitchen glucose blood (ONETOUCH VERIO) test strip Check blood sugar 1 time daily-[id number].  . hyoscyamine (LEVBID) 0.375 MG 12 hr tablet TAKE 1 TABLET BY MOUTH TWICE DAILY (Patient taking differently: daily)  . Magnesium 250 MG TABS Take 250 mg by mouth daily.   . Melatonin 10 MG TABS Take 10 mg by mouth at bedtime.  . metoCLOPramide (REGLAN) 10 MG tablet TAKE 1 TABLET BY MOUTH FOUR TIMES DAILY BEFORE A MEAL AND EVERY NIGHT AT BEDTIME FOR ACID REFLUX (Patient taking differently: daily. )  . montelukast (SINGULAIR) 10 MG tablet TAKE 1 TABLET BY MOUTH EVERY DAY  . olmesartan-hydrochlorothiazide (BENICAR HCT) 40-12.5 MG tablet TAKE 1 TABLET BY MOUTH ONCE DAILY (Patient taking differently: TAKE 1/2 TABLET BY MOUTH ONCE DAILY)  . ONETOUCH DELICA LANCETS 48N MISC Check blood sugar 1 time daily-[id number]  . pantoprazole (PROTONIX) 40 MG tablet TAKE 1 TABLET BY MOUTH  DAILY FOR ACID INDIGESTION  . Probiotic Product (PROBIOTIC DAILY PO) Take by mouth.  . simvastatin (ZOCOR) 40 MG tablet Take 40 mg by mouth daily.  Marland Kitchen  venlafaxine XR (EFFEXOR-XR) 150 MG 24 hr capsule Take 150 mg by mouth 2 (two) times daily. Patient gets RX from a different provider.  . zaleplon (SONATA) 10 MG capsule Take 10 mg by mouth at bedtime as needed for sleep.    No current facility-administered medications on file prior to visit.      Allergies:  Allergies  Allergen Reactions  . Penicillins Anaphylaxis and Rash    Childhood allergy   . Sulfa Antibiotics Shortness Of Breath    Also light headed, rash  . Celebrex [Celecoxib]     Elevates Blood Pressure   . Ciprofloxacin Nausea And Vomiting  . Codeine Nausea And Vomiting    And hydrocodone  . Levaquin [Levofloxacin] Nausea And Vomiting  . Motrin Ib [Ibuprofen] Hives  . Prempro [Conj Estrog-Medroxyprogest Ace] Hives     Medical History:  Past Medical History:  Diagnosis Date   . Anemia   . Anxiety   . Bell's palsy 01/1985  . COPD (chronic obstructive pulmonary disease) ([place])   . Depression   . Diabetes mellitus without complication ([place])   . GERD (gastroesophageal reflux disease)   . Hypertension   . IBS (irritable bowel syndrome) 12/89  . Obesity, Class III, BMI 40-49.9 (morbid obesity) ([place])   . OSA (obstructive sleep apnea)    no cpap  . Vitamin D deficiency    Family history- Reviewed and unchanged Social history- Reviewed and unchanged   Review of Systems:  Review of Systems  Constitutional: Negative for malaise/fatigue and weight loss.  HENT: Negative for hearing loss and tinnitus.   Eyes: Negative for blurred vision and double vision.  Respiratory: Negative for cough, shortness of breath and wheezing.   Cardiovascular: Negative for chest pain, palpitations, orthopnea, claudication and leg swelling.  Gastrointestinal: Negative for abdominal pain, blood in stool, constipation, diarrhea, heartburn, melena, nausea and vomiting.  Genitourinary: Negative.   Musculoskeletal: Negative for joint pain and myalgias.  Skin: Negative for rash.  Neurological: Negative for dizziness, tingling, sensory change, weakness and headaches.  Endo/Heme/Allergies: Negative for polydipsia.  Psychiatric/Behavioral: Negative.   All other systems reviewed and are negative.     Physical Exam: BP 110/70   Pulse 91   Temp 97.7 F (36.5 C)   Ht 5' 4.5" (1.638 m)   Wt 242 lb (109.8 kg)   LMP 04/09/2009   SpO2 97%   BMI 40.90 kg/m  Wt Readings from Last 3 Encounters:  05/06/17 242 lb (109.8 kg)  02/02/17 234 lb 6.4 oz (106.3 kg)  12/15/16 226 lb (102.5 kg)   General Appearance: Well nourished, in no apparent distress. Eyes: PERRLA, EOMs, conjunctiva no swelling or erythema Sinuses: No Frontal/maxillary tenderness ENT/Mouth: Ext aud canals clear, TMs without erythema, bulging. No erythema, swelling, or exudate on post pharynx.  Tonsils not swollen or  erythematous. Hearing normal.  Neck: Supple, thyroid normal.  Respiratory: Respiratory effort normal, BS equal bilaterally without rales, rhonchi, wheezing or stridor.  Cardio: RRR with no MRGs. Brisk peripheral pulses without edema.  Abdomen: Soft, + BS.  Non tender, no guarding, rebound, hernias, masses. Lymphatics: Non tender without lymphadenopathy.  Musculoskeletal: Full ROM, 5/5 strength, Normal gait with cane Skin: Warm, dry without rashes, lesions, ecchymosis.  Neuro: Cranial nerves intact. No cerebellar symptoms.  Psych: Awake and oriented X 3, normal affect, Insight and Judgment appropriate.    Izora Ribas, NP 3:03 PM [facility]

## 2017-05-06 ENCOUNTER — Encounter: Payer: Self-pay | Admitting: Adult Health

## 2017-05-06 ENCOUNTER — Ambulatory Visit: Payer: 59 | Admitting: Adult Health

## 2017-05-06 VITALS — BP 110/70 | HR 91 | Temp 97.7°F | Ht 64.5 in | Wt 242.0 lb

## 2017-05-06 DIAGNOSIS — E559 Vitamin D deficiency, unspecified: Secondary | ICD-10-CM | POA: Diagnosis not present

## 2017-05-06 DIAGNOSIS — F17210 Nicotine dependence, cigarettes, uncomplicated: Secondary | ICD-10-CM | POA: Diagnosis not present

## 2017-05-06 DIAGNOSIS — Z79899 Other long term (current) drug therapy: Secondary | ICD-10-CM | POA: Diagnosis not present

## 2017-05-06 DIAGNOSIS — E782 Mixed hyperlipidemia: Secondary | ICD-10-CM

## 2017-05-06 DIAGNOSIS — F325 Major depressive disorder, single episode, in full remission: Secondary | ICD-10-CM

## 2017-05-06 DIAGNOSIS — K219 Gastro-esophageal reflux disease without esophagitis: Secondary | ICD-10-CM | POA: Diagnosis not present

## 2017-05-06 DIAGNOSIS — I10 Essential (primary) hypertension: Secondary | ICD-10-CM

## 2017-05-06 DIAGNOSIS — E1122 Type 2 diabetes mellitus with diabetic chronic kidney disease: Secondary | ICD-10-CM | POA: Diagnosis not present

## 2017-05-06 MED ORDER — RANITIDINE HCL 150 MG PO TABS: 150.0000 mg | ORAL_TABLET | Freq: Two times a day (BID) | ORAL | 1 refills | Status: DC

## 2017-05-06 NOTE — Patient Instructions (Signed)
GETTING OFF OF PPI's    Nexium/protonix/prilosec/Omeprazole/Dexilant/Aciphex are called PPI's, they are great at healing your stomach but should only be taken for a short period of time.     Recent studies have shown that taken for a long time they  can increase the risk of osteoporosis (weakening of your bones), pneumonia, low magnesium, restless legs, Cdiff (infection that causes diarrhea), DEMENTIA and most recently kidney damage / disease / insufficiency.     Due to this information we want to try to stop the PPI but if you try to stop it abruptly this can cause rebound acid and worsening symptoms.   So this is how we want you to get off the PPI: Generic is always fine!!  - Start taking the nexium/protonix/prilosec/PPI  every other day with  zantac (ranitidine) OR pepcid famotadine 2 x a day for 2-4 weeks - some people stay on this dosage and can not taper off further. Our main goal is to limit the dosage and amount you are taking so if you need to stay on this dose.   - then decrease the PPI to every 3 days while taking the zantac or pepcid 300mg  twice a day the other  days for 2-4  Weeks  - then you can try the zantac or pepcid 300mg  once at night or up to 2 x day as needed.  - you can continue on this once at night or stop all together  - Avoid alcohol, spicy foods, NSAIDS (aleve, ibuprofen) at this time. See foods below.   +++++++++++++++++++++++++++++++++++++++++++  Food Choices for Gastroesophageal Reflux Disease  When you have gastroesophageal reflux disease (GERD), the foods you eat and your eating habits are very important. Choosing the right foods can help ease the discomfort of GERD. WHAT GENERAL GUIDELINES DO I NEED TO FOLLOW?  Choose fruits, vegetables, whole grains, low-fat dairy products, and low-fat meat, fish, and poultry.  Limit fats such as oils, salad dressings, butter, nuts, and avocado.  Keep a food diary to identify foods that cause symptoms.  Avoid foods  that cause reflux. These may be different for different people.  Eat frequent small meals instead of three large meals each day.  Eat your meals slowly, in a relaxed setting.  Limit fried foods.  Cook foods using methods other than frying.  Avoid drinking alcohol.  Avoid drinking large amounts of liquids with your meals.  Avoid bending over or lying down until 2-3 hours after eating.   WHAT FOODS ARE NOT RECOMMENDED? The following are some foods and drinks that may worsen your symptoms:  Vegetables Tomatoes. Tomato juice. Tomato and spaghetti sauce. Chili peppers. Onion and garlic. Horseradish. Fruits Oranges, grapefruit, and lemon (fruit and juice). Meats High-fat meats, fish, and poultry. This includes hot dogs, ribs, ham, sausage, salami, and bacon. Dairy Whole milk and chocolate milk. Sour cream. Cream. Butter. Ice cream. Cream cheese.  Beverages Coffee and tea, with or without caffeine. Carbonated beverages or energy drinks. Condiments Hot sauce. Barbecue sauce.  Sweets/Desserts Chocolate and cocoa. Donuts. Peppermint and spearmint. Fats and Oils High-fat foods, including [place] fries and potato chips. Other Vinegar. Strong spices, such as black pepper, white pepper, red pepper, cayenne, curry powder, cloves, ginger, and chili powder.

## 2017-05-07 ENCOUNTER — Other Ambulatory Visit: Payer: Self-pay | Admitting: Adult Health

## 2017-05-07 DIAGNOSIS — R748 Abnormal levels of other serum enzymes: Secondary | ICD-10-CM

## 2017-05-07 LAB — BASIC METABOLIC PANEL WITH GFR
BUN: 16 mg/dL (ref 7–25)
CHLORIDE: 102 mmol/L (ref 98–110)
CO2: 31 mmol/L (ref 20–32)
Calcium: 9.5 mg/dL (ref 8.6–10.4)
Creat: 0.76 mg/dL (ref 0.50–0.99)
GFR, Est African American: 96 mL/min/{1.73_m2} (ref >=60)
GFR, Est Non African American: 83 mL/min/{1.73_m2} (ref >=60)
GLUCOSE: 85 mg/dL (ref 65–99)
Potassium: 4.5 mmol/L (ref 3.5–5.3)
SODIUM: 139 mmol/L (ref 135–146)

## 2017-05-07 LAB — CBC WITH DIFFERENTIAL/PLATELET
BASOS ABS: 52 {cells}/uL (ref 0–200)
Basophils Relative: 0.6 %
EOS ABS: 331 {cells}/uL (ref 15–500)
Eosinophils Relative: 3.8 %
HEMATOCRIT: 36 % (ref 35.0–45.0)
HEMOGLOBIN: 11.6 g/dL — AB (ref 11.7–15.5)
LYMPHS ABS: 1871 {cells}/uL (ref 850–3900)
MCH: 25.7 pg — AB (ref 27.0–33.0)
MCHC: 32.2 g/dL (ref 32.0–36.0)
MCV: 79.6 fL — AB (ref 80.0–100.0)
MPV: 10.4 fL (ref 7.5–12.5)
Monocytes Relative: 7.1 %
NEUTROS ABS: 5829 {cells}/uL (ref 1500–7800)
NEUTROS PCT: 67 %
Platelets: 258 10*3/uL (ref 140–400)
RBC: 4.52 10*6/uL (ref 3.80–5.10)
RDW: 14.9 % (ref 11.0–15.0)
Total Lymphocyte: 21.5 %
WBC: 8.7 10*3/uL (ref 3.8–10.8)
WBCMIX: 618 {cells}/uL (ref 200–950)

## 2017-05-07 LAB — HEPATIC FUNCTION PANEL
AG RATIO: 1.3 (calc) (ref 1.0–2.5)
ALT: 21 U/L (ref 6–29)
AST: 27 U/L (ref 10–35)
Albumin: 3.9 g/dL (ref 3.6–5.1)
Alkaline phosphatase (APISO): 186 U/L — ABNORMAL HIGH (ref 33–130)
BILIRUBIN DIRECT: 0 mg/dL (ref 0.0–0.2)
BILIRUBIN INDIRECT: 0.3 mg/dL (ref 0.2–1.2)
GLOBULIN: 3 g/dL (ref 1.9–3.7)
Total Bilirubin: 0.3 mg/dL (ref 0.2–1.2)
Total Protein: 6.9 g/dL (ref 6.1–8.1)

## 2017-05-07 LAB — LIPID PANEL
CHOL/HDL RATIO: 2.5 (calc) (ref <5.0)
Cholesterol: 170 mg/dL (ref <200)
HDL: 67 mg/dL (ref >50)
LDL CHOLESTEROL (CALC): 86 mg/dL
NON-HDL CHOLESTEROL (CALC): 103 mg/dL (ref <130)
Triglycerides: 81 mg/dL (ref <150)

## 2017-05-07 LAB — HEMOGLOBIN A1C
HEMOGLOBIN A1C: 6.2 %{Hb} — AB (ref <5.7)
MEAN PLASMA GLUCOSE: 131 (calc)
eAG (mmol/L): 7.3 (calc)

## 2017-05-07 LAB — VITAMIN D 25 HYDROXY (VIT D DEFICIENCY, FRACTURES): Vit D, 25-Hydroxy: 43 ng/mL (ref 30–100)

## 2017-05-07 LAB — TSH: TSH: 2.31 mIU/L (ref 0.40–4.50)

## 2017-05-12 ENCOUNTER — Encounter: Payer: Self-pay | Admitting: Adult Health

## 2017-05-20 ENCOUNTER — Ambulatory Visit
Admission: RE | Admit: 2017-05-20 | Discharge: 2017-05-20 | Disposition: A | Discharge status: Home / Self Care | Payer: 59 | Source: Ambulatory Visit | Attending: Adult Health | Admitting: Adult Health

## 2017-05-20 DIAGNOSIS — R748 Abnormal levels of other serum enzymes: Secondary | ICD-10-CM

## 2017-05-21 ENCOUNTER — Encounter: Payer: Self-pay | Admitting: Adult Health

## 2017-06-01 IMAGING — MR MR LUMBAR SPINE W/O CM
4 of 5 series · 19 of 48 positions shown · non-contrast
Comparison: No prior lumbar MRI examinations are available.

CLINICAL DATA: RIGHT hip and leg pain and weakness for 6 months.

EXAM:
MRI LUMBAR SPINE WITHOUT CONTRAST
TECHNIQUE: Multiplanar, multisequence MR imaging of the lumbar spine was
performed. No intravenous contrast was administered.

[Series 5: T2 · sagittal · 4.0mm · 0.75mm/px · 6 of 15 slices shown (1 of 2)]
[im 1/15]
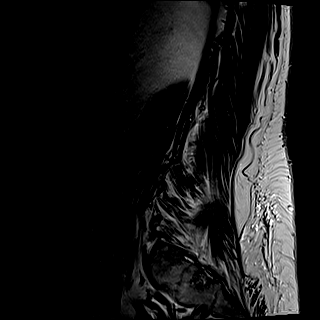
[im 3/15]
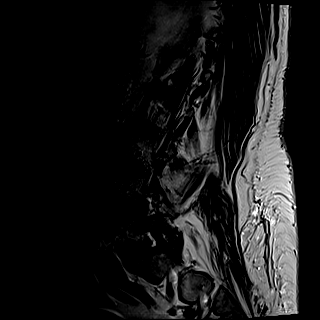
[im 6/15]
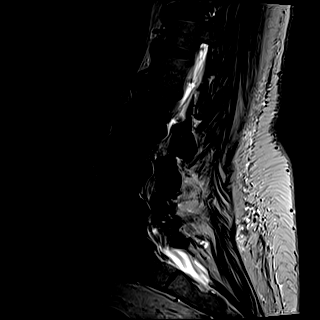
[im 9/15]
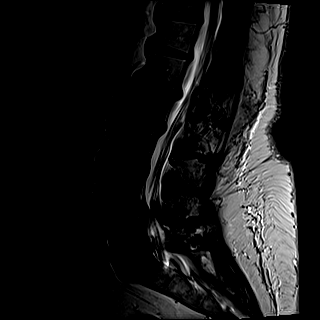
[im 12/15]
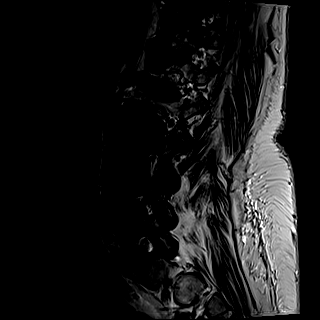
[im 15/15]
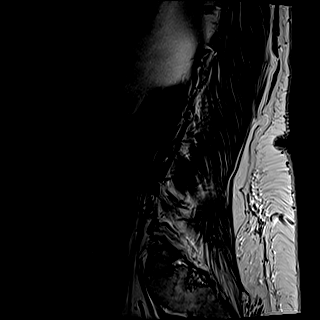

[Series 6: T1 · sagittal · 4.0mm · 0.75mm/px · 3 of 15 slices shown (1 of 2)]
[im 3/15]
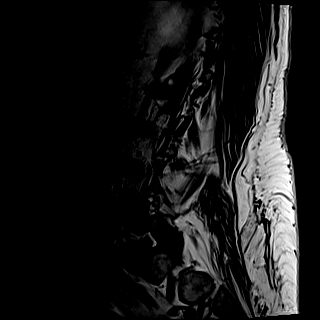
[im 8/15]
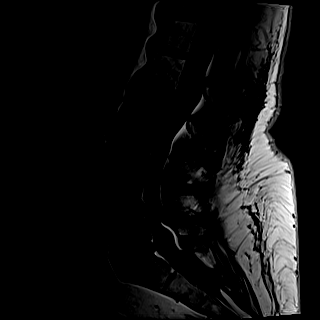
[im 12/15]
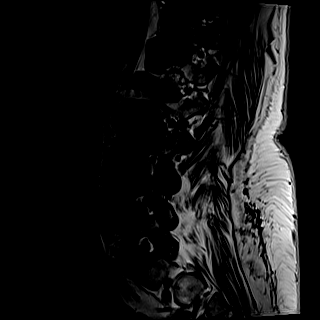

[Series 10: T1 · axial · 4.0mm · 0.28mm/px · z∈[-34,+112]mm · 3 of 32 slices shown (2 of 2)]
[im 5/32]
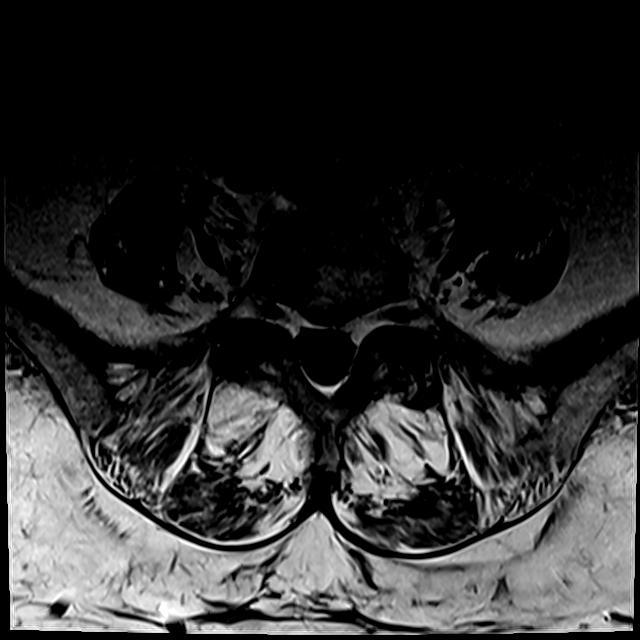
[im 17/32]
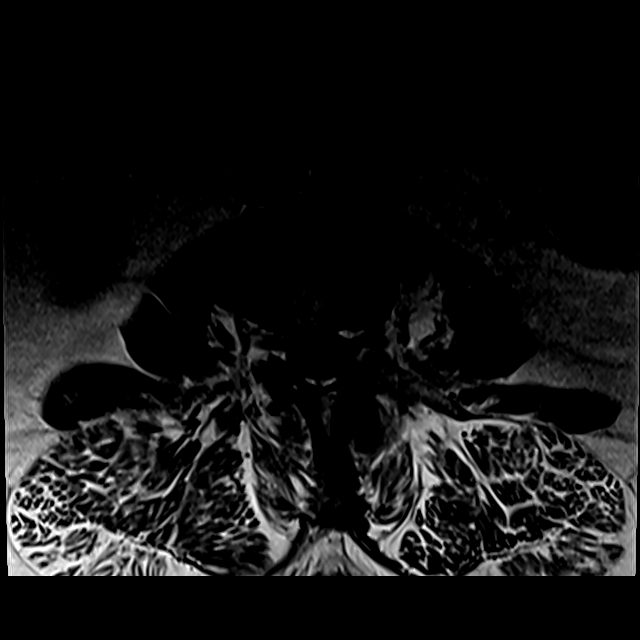
[im 27/32]
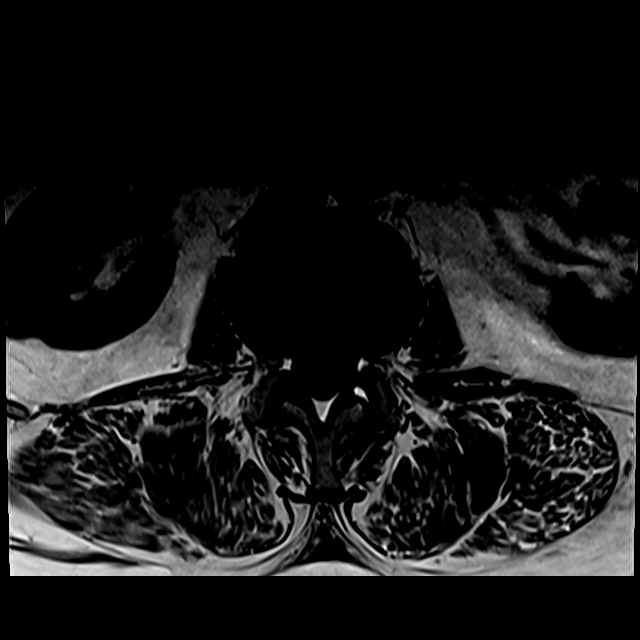

[Series 13: T2 · axial · 4.0mm · 0.28mm/px · z∈[-54,+112]mm · 7 of 32 slices shown (2 of 2)]
[im 1/32]
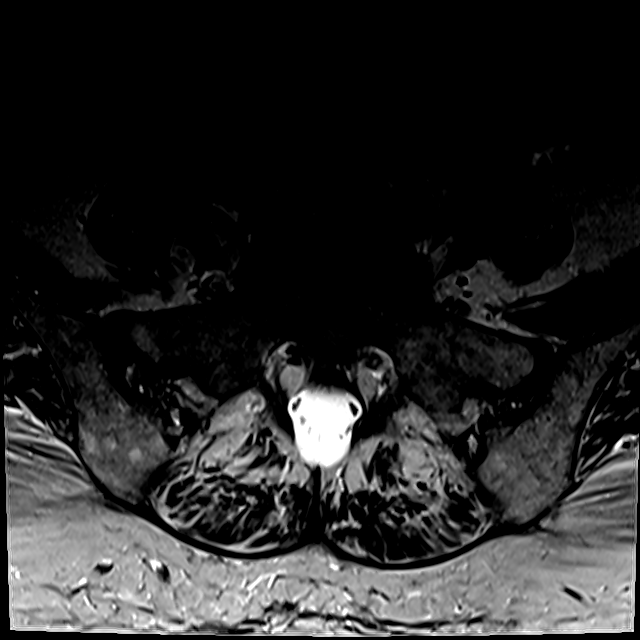
[im 5/32]
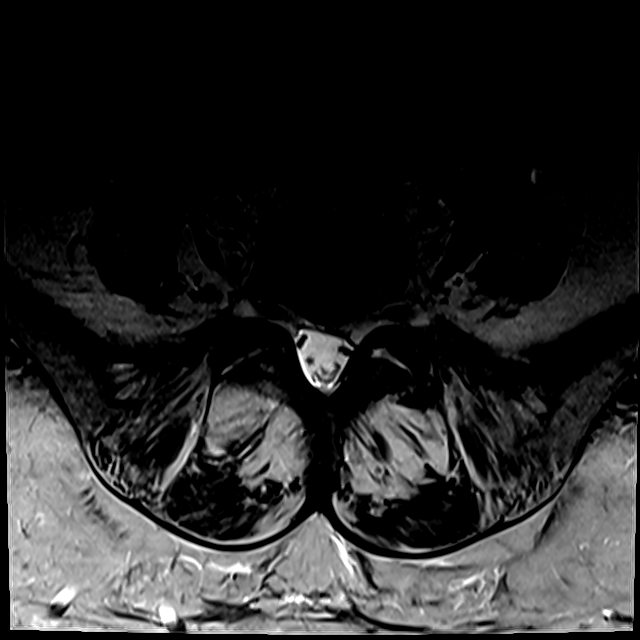
[im 10/32]
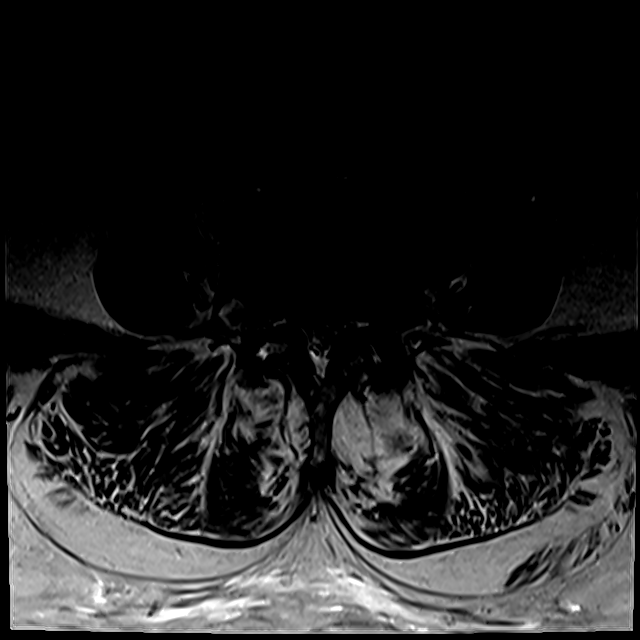
[im 15/32]
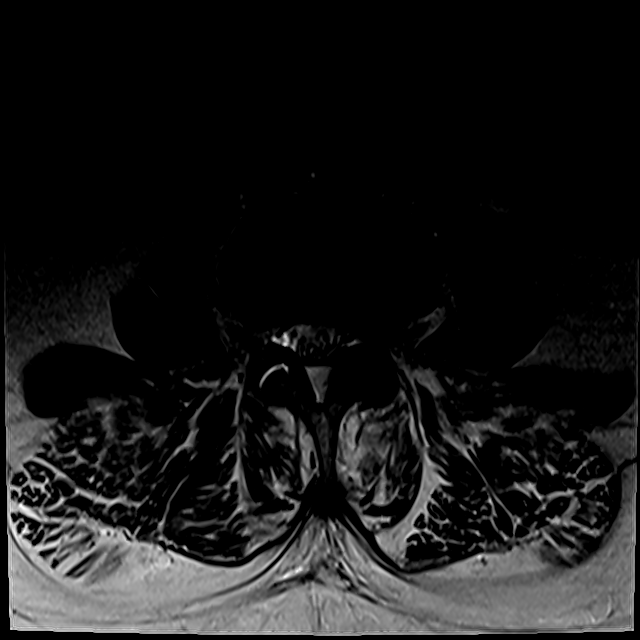
[im 17/32]
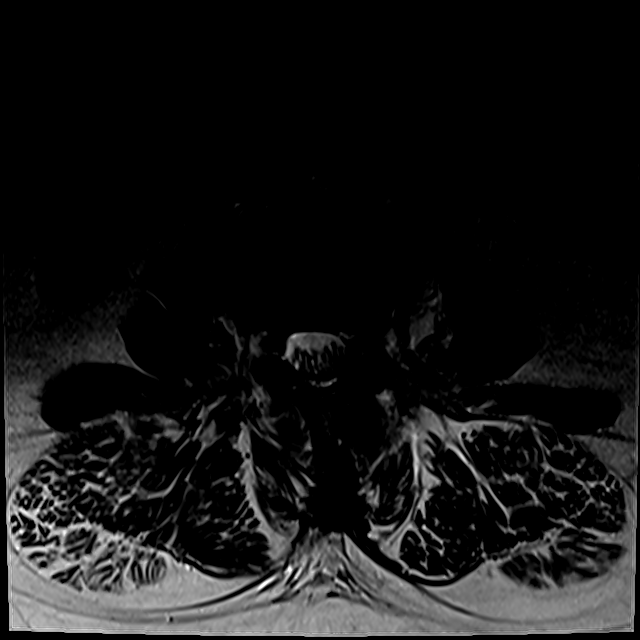
[im 22/32]
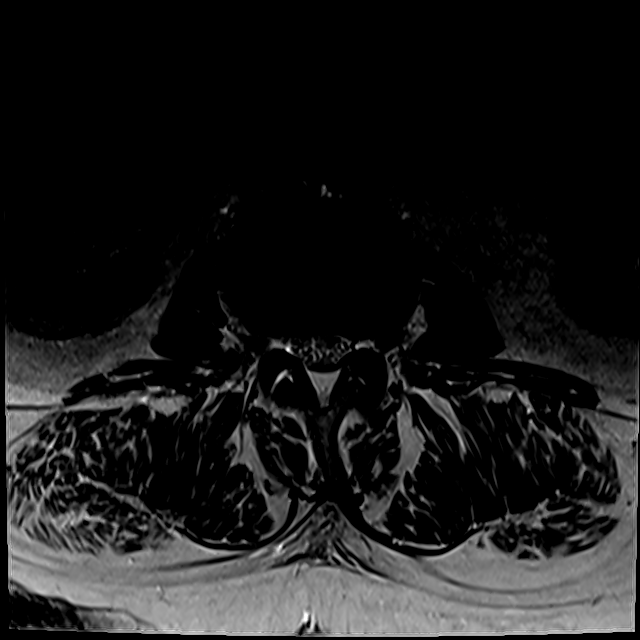
[im 27/32]
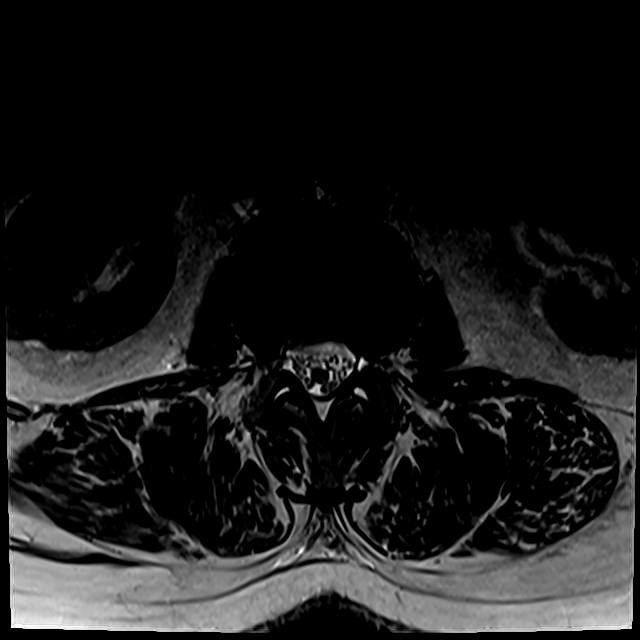

[19 of 48 positions shown; findings below may reference images not displayed]

FINDINGS: Segmentation:  Standard.

Alignment: 5 mm anterolisthesis L4-5 is facet mediated. Trace
retrolisthesis L2-3 and L3-4 is compensatory.

Vertebrae:  No worrisome osseous lesion.

Conus medullaris: Extends to the L2 level and appears normal.

Paraspinal and other soft tissues: Negative.

Disc levels:

L1-L2: Advanced disc space narrowing. Trace retrolisthesis. Central
protrusion. No impingement.

L2-L3: Moderate disc space narrowing. Central protrusion. Posterior
element hypertrophy. Disc material extends into both foramina.
Subarticular zone and foraminal zone narrowing is not established.

L3-L4: Central protrusion. Posterior element hypertrophy. Mild
stenosis. Disc material extends into the LEFT foramen. No definite
subarticular zone or foraminal zone narrowing.

L4-L5: 5 mm anterolisthesis. Uncovering of the disc with
superimposed central protrusion. Posterior element hypertrophy.
Moderate stenosis. BILATERAL subarticular zone narrowing affects the
L5 nerve roots, slightly worse on the RIGHT. BILATERAL foraminal
narrowing, slightly worse on the LEFT, does not clearly affect the
L4 nerve roots.

L5-S1: Central and leftward protrusion. Posterior element
hypertrophy. No definite subarticular zone or foraminal zone
narrowing.
IMPRESSION: The dominant abnormality is L4-5, where 5 mm of facet mediated
anterolisthesis, central protrusion, posterior element hypertrophy
contribute to moderate stenosis BILATERAL subarticular zone
narrowing affecting both L5 nerve roots, slightly worse on the
RIGHT.

Multilevel spondylosis elsewhere, not clearly contributing to
RIGHT-sided symptoms.

## 2017-06-28 ENCOUNTER — Other Ambulatory Visit: Payer: Self-pay | Admitting: Internal Medicine

## 2017-08-04 ENCOUNTER — Other Ambulatory Visit: Payer: Self-pay | Admitting: Internal Medicine

## 2017-08-04 ENCOUNTER — Ambulatory Visit (INDEPENDENT_AMBULATORY_CARE_PROVIDER_SITE_OTHER): Payer: 59 | Admitting: Internal Medicine

## 2017-08-04 ENCOUNTER — Encounter: Payer: Self-pay | Admitting: Internal Medicine

## 2017-08-04 VITALS — BP 122/80 | HR 72 | Temp 97.7°F | Resp 16 | Ht 65.0 in | Wt 259.0 lb

## 2017-08-04 DIAGNOSIS — E782 Mixed hyperlipidemia: Secondary | ICD-10-CM

## 2017-08-04 DIAGNOSIS — Z6841 Body Mass Index (BMI) 40.0 and over, adult: Secondary | ICD-10-CM

## 2017-08-04 DIAGNOSIS — E559 Vitamin D deficiency, unspecified: Secondary | ICD-10-CM

## 2017-08-04 DIAGNOSIS — Z1212 Encounter for screening for malignant neoplasm of rectum: Secondary | ICD-10-CM

## 2017-08-04 DIAGNOSIS — F17200 Nicotine dependence, unspecified, uncomplicated: Secondary | ICD-10-CM

## 2017-08-04 DIAGNOSIS — I10 Essential (primary) hypertension: Secondary | ICD-10-CM | POA: Diagnosis not present

## 2017-08-04 DIAGNOSIS — E1122 Type 2 diabetes mellitus with diabetic chronic kidney disease: Secondary | ICD-10-CM

## 2017-08-04 DIAGNOSIS — Z0000 Encounter for general adult medical examination without abnormal findings: Secondary | ICD-10-CM | POA: Diagnosis not present

## 2017-08-04 DIAGNOSIS — K219 Gastro-esophageal reflux disease without esophagitis: Secondary | ICD-10-CM

## 2017-08-04 DIAGNOSIS — N182 Chronic kidney disease, stage 2 (mild): Secondary | ICD-10-CM

## 2017-08-04 DIAGNOSIS — Z136 Encounter for screening for cardiovascular disorders: Secondary | ICD-10-CM | POA: Diagnosis not present

## 2017-08-04 DIAGNOSIS — R5383 Other fatigue: Secondary | ICD-10-CM

## 2017-08-04 DIAGNOSIS — Z0001 Encounter for general adult medical examination with abnormal findings: Secondary | ICD-10-CM

## 2017-08-04 DIAGNOSIS — Z8249 Family history of ischemic heart disease and other diseases of the circulatory system: Secondary | ICD-10-CM

## 2017-08-04 DIAGNOSIS — Z1211 Encounter for screening for malignant neoplasm of colon: Secondary | ICD-10-CM

## 2017-08-04 DIAGNOSIS — N390 Urinary tract infection, site not specified: Secondary | ICD-10-CM

## 2017-08-04 DIAGNOSIS — Z79899 Other long term (current) drug therapy: Secondary | ICD-10-CM

## 2017-08-04 MED ORDER — PHENTERMINE HCL 37.5 MG PO TABS: ORAL_TABLET | ORAL | 5 refills | Status: DC

## 2017-08-04 NOTE — Patient Instructions (Signed)

## 2017-08-04 NOTE — Progress Notes (Signed)
[facility] [name], M.D.     Uvaldo Bristle. Silverio Lay, P.A.-C Liane Comber, Lindy [address] [place], [place] [address] Telephone [phone] Telefax [phone] Annual Screening/Preventative Visit & Comprehensive Evaluation &  Examination     This very nice 67 y.o. DWF presents for a Screening/Preventative Visit & comprehensive evaluation and management of multiple medical co-morbidities.  Patient has been followed for HTN, HLD, T2_NIDDM  and Vitamin D Deficiency. Patient has GERD controlled on her meds.      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  She's also been seen by Dr Lynann Bologna and patient has received  EDSI's x 3  by Dr Mina Marble. Last Surgery was L4-5 Fusion by Dr Sherley Bounds in July 2018.       HTN predates since 2007. Patient's BP has been controlled at home and patient denies any cardiac symptoms as chest pain, palpitations, shortness of breath, dizziness or ankle swelling. Today's BP is at goal - 122/80.      Patient's hyperlipidemia is controlled with diet and medications. Patient denies myalgias or other medication SE's. Last lipids were at [facility]: Lab Results  Component Value Date   CHOL 170 05/06/2017   HDL 67 05/06/2017   LDLCALC 86 05/06/2017   TRIG 81 05/06/2017   CHOLHDL 2.5 05/06/2017      Patient has  Morbid Obesity (BMI 42+) and T2_NIDDM (2008) and she attempting dietary management.   and patient denies reactive hypoglycemic symptoms, visual blurring, diabetic polys, or paresthesias. Last A1c was not at goal:  Lab Results  Component Value Date   HGBA1C 6.2 (H) 05/06/2017      Finally, patient has history of Vitamin D Deficiency ("13"/2008)  and last Vitamin D was still low: Lab Results  Component Value Date   VD25OH 43 05/06/2017   Current Outpatient Medications on File Prior to Visit   Medication Sig  . acetaminophen (TYLENOL) 500 MG tablet Take 500 mg by mouth 2 (two) times daily.  Marland Kitchen albuterol (PROVENTIL HFA;VENTOLIN HFA) 108 (90 Base) MCG/ACT inhaler Inhale 2 puffs into the lungs every 6 (six) hours as needed for wheezing.  Marland Kitchen ALPRAZolam (XANAX) 0.25 MG tablet Take 1/2 to 1 tablet 2 x / day if needed for anxiety. (Patient taking differently: Take 0.25 mg by mouth 2 (two) times daily. )  . aspirin 81 MG tablet Take 81 mg by mouth at bedtime.   . Biotin 5000 MCG CAPS Take by mouth.  . Calcium Carbonate-Vit D-Min (CALCIUM 1200 PO) Take by mouth.  . cetirizine (ZYRTEC) 10 MG tablet Take 10 mg by mouth daily.  . Cholecalciferol (D3-1000) 1000 units capsule Take 1,000 Units by mouth daily.  Marland Kitchen CINNAMON PO Take 1,500 mg by mouth.  . cyclobenzaprine (FLEXERIL) 10 MG tablet TAKE 1/2 TO 1 TABLET BY  MOUTH 3 TIMES A DAY ONLY IF NEEDED FOR MUSCLE SPASM (Patient taking differently: Take 10mg s by mouth daily at night, may take an additional 10 mg daily as needed for muscle spasms)  . docusate sodium (COLACE) 100 MG capsule Take 100 mg by mouth daily as needed for mild constipation.  . fluticasone (FLONASE) 50 MCG/ACT nasal spray Place 2 sprays into the nose daily as needed for allergies.   Marland Kitchen glucose blood (ONETOUCH VERIO) test strip Check blood sugar 1 time daily-DX-E11.22.  . Magnesium 250 MG TABS Take 250 mg by mouth daily.   Marland Kitchen  Melatonin 10 MG TABS Take 10 mg by mouth at bedtime.  . metoCLOPramide (REGLAN) 10 MG tablet TAKE 1 TABLET BY MOUTH FOUR TIMES DAILY BEFORE A MEAL AND EVERY NIGHT AT BEDTIME FOR ACID REFLUX (Patient taking differently: daily. )  . montelukast (SINGULAIR) 10 MG tablet TAKE 1 TABLET BY MOUTH EVERY DAY  . olmesartan-hydrochlorothiazide (BENICAR HCT) 40-12.5 MG tablet TAKE 1 TABLET BY MOUTH ONCE DAILY (Patient taking differently: TAKE 1/2 TABLET BY MOUTH ONCE DAILY)  . ONETOUCH DELICA LANCETS 45W MISC Check blood sugar 1 time daily-DX-E11.22  .  Phenylephrine-Guaifenesin (MUCUS RELIEF PE PO) Take 1 tablet by mouth daily.  . Probiotic Product (PROBIOTIC DAILY PO) Take by mouth.  . ranitidine (ZANTAC) 150 MG tablet Take 1 tablet (150 mg total) by mouth 2 (two) times daily.  . simvastatin (ZOCOR) 40 MG tablet TAKE 1 TABLET BY MOUTH  EVERY EVENING  . venlafaxine XR (EFFEXOR-XR) 150 MG 24 hr capsule Take 150 mg by mouth 2 (two) times daily. Patient gets RX from a different provider.  . zaleplon (SONATA) 10 MG capsule Take 10 mg by mouth at bedtime as needed for sleep.    No current facility-administered medications on file prior to visit.    Allergies  Allergen Reactions  . Penicillins Anaphylaxis and Rash    Childhood allergy   . Sulfa Antibiotics Shortness Of Breath    Also light headed, rash  . Celebrex [Celecoxib]     Elevates Blood Pressure   . Ciprofloxacin Nausea And Vomiting  . Codeine Nausea And Vomiting    And hydrocodone  . Levaquin [Levofloxacin] Nausea And Vomiting  . Motrin Ib [Ibuprofen] Hives  . Prempro [Conj Estrog-Medroxyprogest Ace] Hives   Past Medical History:  Diagnosis Date  . Anemia   . Anxiety   . Bell's palsy 01/1985  . COPD (chronic obstructive pulmonary disease) ([place])   . Depression   . Diabetes mellitus without complication ([place])   . GERD (gastroesophageal reflux disease)   . Hypertension   . IBS (irritable bowel syndrome) 12/89  . Obesity, Class III, BMI 40-49.9 (morbid obesity) ([place])   . OSA (obstructive sleep apnea)    no cpap  . Vitamin D deficiency    Health Maintenance  Topic Date Due  . INFLUENZA VACCINE  10/07/2017  . HEMOGLOBIN A1C  11/03/2017  . MAMMOGRAM  11/27/2017  . OPHTHALMOLOGY EXAM  12/22/2017  . FOOT EXAM  08/05/2018  . PAP SMEAR  12/16/2019  . COLONOSCOPY  10/13/2022  . TETANUS/TDAP  05/20/2025  . Hepatitis C Screening  Completed  . HIV Screening  Completed   Immunization History  Administered Date(s) Administered  . DT 05/21/2015  . Influenza,inj,quad, With  Preservative 12/09/2015  . Influenza-Unspecified 12/07/2016  . PPD Test 03/21/2013, 05/21/2015  . Pneumococcal Conjugate-13 01/04/2014  . Pneumococcal Polysaccharide-23 03/09/2004  . Td 03/09/2004  . Tdap 03/10/2015  . Typhoid Inactivated 05/10/2014  . Zoster 12/22/2012   Last Colon - 08/06.2014 - Dr Collene Mares - Recc 10 yr f/u - due Aug 2014  Last MGM - 09.21.2017 at [facility] and she thinks she had in 2018 and due again in Sept 2019.  Past Surgical History:  Procedure Laterality Date  . BLADDER SUSPENSION  03/2006, 07/2006, 01/2007   for stress incontinence  . COLONOSCOPY  05/2005   Normal, recheck 10 yrs  . JOINT REPLACEMENT     x3 lt knee  . LUMBAR FUSION Right 09/2016   L4-5 fusion by Dr. Sherley Bounds  . TONSILLECTOMY AND ADENOIDECTOMY    .  TOTAL KNEE ARTHROPLASTY Left 2012   x 2 left knee  . TOTAL KNEE REVISION  2014   Family History  Problem Relation Age of Onset  . Hypertension Father   . Diabetes Father   . Parkinson's disease Father   . Dementia Mother   . CAD Paternal Grandfather 80       Died MI   Social History   Tobacco Use  . Smoking status: Current Every Day Smoker    Packs/day: 0.50    Years: 40.00    Pack years: 20.00    Types: Cigarettes  . Smokeless tobacco: Never Used  Substance Use Topics  . Alcohol use: No  . Drug use: No    ROS Constitutional: Denies fever, chills, weight loss/gain, headaches, insomnia,  night sweats, and change in appetite. Does c/o fatigue. Eyes: Denies redness, blurred vision, diplopia, discharge, itchy, watery eyes.  ENT: Denies discharge, congestion, post nasal drip, epistaxis, sore throat, earache, hearing loss, dental pain, Tinnitus, Vertigo, Sinus pain, snoring.  Cardio: Denies chest pain, palpitations, irregular heartbeat, syncope, dyspnea, diaphoresis, orthopnea, PND, claudication, edema Respiratory: denies cough, dyspnea, DOE, pleurisy, hoarseness, laryngitis, wheezing.  Gastrointestinal: Denies dysphagia, heartburn,  reflux, water brash, pain, cramps, nausea, vomiting, bloating, diarrhea, constipation, hematemesis, melena, hematochezia, jaundice, hemorrhoids Genitourinary: Denies dysuria, frequency, urgency, nocturia, hesitancy, discharge, hematuria, flank pain Breast: Breast lumps, nipple discharge, bleeding.  Musculoskeletal: Denies arthralgia, myalgia, stiffness, Jt. Swelling, pain, limp, and strain/sprain. Denies falls. Skin: Denies puritis, rash, hives, warts, acne, eczema, changing in skin lesion Neuro: No weakness, tremor, incoordination, spasms, paresthesia, pain Psychiatric: Denies confusion, memory loss, sensory loss. Denies Depression. Endocrine: Denies change in weight, skin, hair change, nocturia, and paresthesia, diabetic polys, visual blurring, hyper / hypo glycemic episodes.  Heme/Lymph: No excessive bleeding, bruising, enlarged lymph nodes.  Physical Exam  BP 122/80   Pulse 72   Temp 97.7 F (36.5 C)   Resp 16   Ht 5\' 5"  (1.651 m)   Wt 259 lb (117.5 kg)   LMP 04/09/2009   BMI 43.10 kg/m   General Appearance: Well nourished, well groomed and in no apparent distress.  Eyes: PERRLA, EOMs, conjunctiva no swelling or erythema, normal fundi and vessels. Sinuses: No frontal/maxillary tenderness ENT/Mouth: EACs patent / TMs  nl. Nares clear without erythema, swelling, mucoid exudates. Oral hygiene is good. No erythema, swelling, or exudate. Tongue normal, non-obstructing. Tonsils not swollen or erythematous. Hearing normal.  Neck: Supple, thyroid not palpable. No bruits, nodes or JVD. Respiratory: Respiratory effort normal.  BS equal and clear bilateral without rales, rhonci, wheezing or stridor. Cardio: Heart sounds are normal with regular rate and rhythm and no murmurs, rubs or gallops. Peripheral pulses are normal and equal bilaterally without edema. No aortic or femoral bruits. Chest: symmetric with normal excursions and percussion. Breasts: Symmetric, without lumps, nipple discharge,  retractions, or fibrocystic changes.  Abdomen: Flat, soft with bowel sounds active. Nontender, no guarding, rebound, hernias, masses, or organomegaly.  Lymphatics: Non tender without lymphadenopathy.   Musculoskeletal: Full ROM all peripheral extremities, joint stability, 5/5 strength, and normal gait. Skin: Warm and dry without rashes, lesions, cyanosis, clubbing or  ecchymosis.  Neuro: Cranial nerves intact, reflexes equal bilaterally. Normal muscle tone, no cerebellar symptoms.  Sensation intact to touch, vibratory and Monofilament to the toes bilaterally. Pysch: Alert and oriented X 3, normal affect, Insight and Judgment appropriate.   Assessment and Plan  1. Annual Preventative Screening Examination  2. Essential hypertension  - EKG 12-Lead - [place], RETROPERITNL ABD,  LTD - Urinalysis, Routine w reflex microscopic - Microalbumin / creatinine urine ratio - CBC with Differential/Platelet - COMPLETE METABOLIC PANEL WITH GFR - Magnesium - TSH  3. Hyperlipidemia, mixed  - EKG 12-Lead - [place], RETROPERITNL ABD,  LTD - Lipid panel - TSH  4. Controlled type 2 diabetes mellitus with stage 2 chronic kidney disease, without long-term current use of insulin (HCC)  - EKG 12-Lead - [place], RETROPERITNL ABD,  LTD - Urinalysis, Routine w reflex microscopic - Microalbumin / creatinine urine ratio - HM DIABETES FOOT EXAM - LOW EXTREMITY NEUR EXAM DOCUM - Hemoglobin A1c - Insulin, random  5. Vitamin D deficiency  - VITAMIN D 25 Hydroxyl  6. Morbid obesity with BMI of 40.0-44.9, adult (HCC)  - Hemoglobin A1c - Insulin, random  - phentermine (ADIPEX-P) 37.5 MG tablet; Take 1/2 to 1 tablet every morning for dieting & weight  loss  Dispense: 30 tablet; Refill: 5  7. Gastroesophageal reflux disease  - CBC with Differential/Platelet  8. Screening for colorectal cancer  - POC Hemoccult Bld/Stl  9. Screening for ischemic heart disease  - EKG 12-Lead  10. FHx: heart disease  - EKG  12-Lead - [place], RETROPERITNL ABD,  LTD  11. Smoker  - EKG 12-Lead - [place], RETROPERITNL ABD,  LTD  12. Screening for AAA (aortic abdominal aneurysm)  - [place], RETROPERITNL ABD,  LTD  13. Fatigue, unspecified type  - Iron,Total/Total Iron Binding Cap - Vitamin B12 - CBC with Differential/Platelet - TSH  14. Medication management  - Urinalysis, Routine w reflex microscopic - Microalbumin / creatinine urine ratio - CBC with Differential/Platelet - COMPLETE METABOLIC PANEL WITH GFR - Magnesium - Lipid panel - TSH - Hemoglobin A1c - Insulin, random - VITAMIN D 25 Hydroxyl           Patient was counseled in prudent diet to achieve/maintain BMI less than 25 for weight control, BP monitoring, regular exercise and medications. Discussed med's effects and SE's. Screening labs and tests as requested with regular follow-up as recommended. Over 40 minutes of exam, counseling, chart review and high complex critical decision making was performed.

## 2017-08-05 ENCOUNTER — Encounter: Payer: Self-pay | Admitting: *Deleted

## 2017-08-05 ENCOUNTER — Other Ambulatory Visit: Payer: Self-pay | Admitting: Internal Medicine

## 2017-08-05 DIAGNOSIS — E039 Hypothyroidism, unspecified: Secondary | ICD-10-CM

## 2017-08-05 DIAGNOSIS — N390 Urinary tract infection, site not specified: Secondary | ICD-10-CM

## 2017-08-05 LAB — COMPLETE METABOLIC PANEL WITH GFR
AG Ratio: 1.5 (calc) (ref 1.0–2.5)
ALT: 15 U/L (ref 6–29)
AST: 18 U/L (ref 10–35)
Albumin: 3.8 g/dL (ref 3.6–5.1)
Alkaline phosphatase (APISO): 140 U/L — ABNORMAL HIGH (ref 33–130)
BUN: 12 mg/dL (ref 7–25)
CO2: 31 mmol/L (ref 20–32)
Calcium: 9.3 mg/dL (ref 8.6–10.4)
Chloride: 104 mmol/L (ref 98–110)
Creat: 0.79 mg/dL (ref 0.50–0.99)
GFR, Est African American: 92 mL/min/{1.73_m2} (ref >=60)
GFR, Est Non African American: 79 mL/min/{1.73_m2} (ref >=60)
Globulin: 2.6 g/dL (calc) (ref 1.9–3.7)
Glucose, Bld: 121 mg/dL — ABNORMAL HIGH (ref 65–99)
Potassium: 4.9 mmol/L (ref 3.5–5.3)
Sodium: 141 mmol/L (ref 135–146)
Total Bilirubin: 0.3 mg/dL (ref 0.2–1.2)
Total Protein: 6.4 g/dL (ref 6.1–8.1)

## 2017-08-05 LAB — LIPID PANEL
Cholesterol: 174 mg/dL (ref <200)
HDL: 67 mg/dL (ref >50)
LDL Cholesterol (Calc): 88 mg/dL (calc)
Non-HDL Cholesterol (Calc): 107 mg/dL (calc) (ref <130)
Total CHOL/HDL Ratio: 2.6 (calc) (ref <5.0)
Triglycerides: 96 mg/dL (ref <150)

## 2017-08-05 LAB — MICROALBUMIN / CREATININE URINE RATIO
Creatinine, Urine: 113 mg/dL (ref 20–275)
Microalb Creat Ratio: 5 mcg/mg creat (ref <30)
Microalb, Ur: 0.6 mg/dL

## 2017-08-05 LAB — CBC WITH DIFFERENTIAL/PLATELET
BASOS ABS: 60 {cells}/uL (ref 0–200)
BASOS PCT: 0.9 %
EOS PCT: 4.9 %
Eosinophils Absolute: 328 cells/uL (ref 15–500)
HEMATOCRIT: 35.1 % (ref 35.0–45.0)
Hemoglobin: 11.3 g/dL — ABNORMAL LOW (ref 11.7–15.5)
LYMPHS ABS: 1742 {cells}/uL (ref 850–3900)
MCH: 26.2 pg — ABNORMAL LOW (ref 27.0–33.0)
MCHC: 32.2 g/dL (ref 32.0–36.0)
MCV: 81.4 fL (ref 80.0–100.0)
MONOS PCT: 8.6 %
MPV: 10.3 fL (ref 7.5–12.5)
NEUTROS ABS: 3993 {cells}/uL (ref 1500–7800)
Neutrophils Relative %: 59.6 %
Platelets: 225 10*3/uL (ref 140–400)
RBC: 4.31 10*6/uL (ref 3.80–5.10)
RDW: 15.7 % — ABNORMAL HIGH (ref 11.0–15.0)
Total Lymphocyte: 26 %
WBC mixed population: 576 cells/uL (ref 200–950)
WBC: 6.7 10*3/uL (ref 3.8–10.8)

## 2017-08-05 LAB — HEMOGLOBIN A1C
Hgb A1c MFr Bld: 6.3 % of total Hgb — ABNORMAL HIGH (ref <5.7)
Mean Plasma Glucose: 134 (calc)
eAG (mmol/L): 7.4 (calc)

## 2017-08-05 LAB — IRON, TOTAL/TOTAL IRON BINDING CAP
%SAT: 11 % (calc) (ref 11–50)
IRON: 41 ug/dL — AB (ref 45–160)
TIBC: 363 mcg/dL (calc) (ref 250–450)

## 2017-08-05 LAB — URINALYSIS, ROUTINE W REFLEX MICROSCOPIC
Bilirubin Urine: NEGATIVE
GLUCOSE, UA: NEGATIVE
HGB URINE DIPSTICK: NEGATIVE
Ketones, ur: NEGATIVE
Nitrite: NEGATIVE
PROTEIN: NEGATIVE
Specific Gravity, Urine: 1.023 (ref 1.001–1.03)
pH: <=5 (ref 5.0–8.0)

## 2017-08-05 LAB — INSULIN, RANDOM: Insulin: 29.1 u[IU]/mL — ABNORMAL HIGH (ref 2.0–19.6)

## 2017-08-05 LAB — MAGNESIUM: Magnesium: 2 mg/dL (ref 1.5–2.5)

## 2017-08-05 LAB — VITAMIN B12: VITAMIN B 12: 403 pg/mL (ref 200–1100)

## 2017-08-05 LAB — VITAMIN D 25 HYDROXY (VIT D DEFICIENCY, FRACTURES): VIT D 25 HYDROXY: 52 ng/mL (ref 30–100)

## 2017-08-05 LAB — TSH: TSH: 4.87 mIU/L — ABNORMAL HIGH (ref 0.40–4.50)

## 2017-08-05 MED ORDER — NITROFURANTOIN MONOHYD MACRO 100 MG PO CAPS: 100.0000 mg | ORAL_CAPSULE | Freq: Two times a day (BID) | ORAL | 0 refills | Status: AC

## 2017-08-05 NOTE — Addendum Note (Signed)
Addended by: Dolores Hoose on: 08/05/2017 09:36 AM   Modules accepted: Orders

## 2017-08-06 LAB — URINE CULTURE
MICRO NUMBER: 90654724
SPECIMEN QUALITY:: ADEQUATE

## 2017-08-09 ENCOUNTER — Other Ambulatory Visit: Payer: Self-pay | Admitting: Internal Medicine

## 2017-08-09 ENCOUNTER — Encounter: Payer: Self-pay | Admitting: Internal Medicine

## 2017-08-09 MED ORDER — CEPHALEXIN 500 MG PO CAPS: ORAL_CAPSULE | ORAL | 0 refills | Status: DC

## 2017-08-16 ENCOUNTER — Other Ambulatory Visit: Payer: Self-pay | Admitting: Internal Medicine

## 2017-08-17 ENCOUNTER — Other Ambulatory Visit: Payer: Self-pay

## 2017-08-17 DIAGNOSIS — Z1212 Encounter for screening for malignant neoplasm of rectum: Secondary | ICD-10-CM | POA: Diagnosis not present

## 2017-08-17 DIAGNOSIS — Z1211 Encounter for screening for malignant neoplasm of colon: Secondary | ICD-10-CM

## 2017-08-17 LAB — POC HEMOCCULT BLD/STL (HOME/3-CARD/SCREEN)
FECAL OCCULT BLD: NEGATIVE
FECAL OCCULT BLD: NEGATIVE
Fecal Occult Blood, POC: NEGATIVE

## 2017-10-03 ENCOUNTER — Other Ambulatory Visit: Payer: Self-pay | Admitting: Internal Medicine

## 2017-10-14 ENCOUNTER — Other Ambulatory Visit: Payer: Self-pay | Admitting: Gastroenterology

## 2017-10-14 DIAGNOSIS — K824 Cholesterolosis of gallbladder: Secondary | ICD-10-CM

## 2017-10-27 ENCOUNTER — Ambulatory Visit
Admission: RE | Admit: 2017-10-27 | Discharge: 2017-10-27 | Disposition: A | Discharge status: Home / Self Care | Payer: Medicare Other | Source: Ambulatory Visit | Attending: Gastroenterology | Admitting: Gastroenterology

## 2017-10-27 DIAGNOSIS — K824 Cholesterolosis of gallbladder: Secondary | ICD-10-CM

## 2017-11-09 DIAGNOSIS — M8580 Other specified disorders of bone density and structure, unspecified site: Secondary | ICD-10-CM | POA: Insufficient documentation

## 2017-11-09 NOTE — Progress Notes (Signed)
MEDICARE ANNUAL WELLNESS VISIT AND FOLLOW UP  Assessment:   Diagnoses and all orders for this visit:  Welcome to Medicare preventive visit - EKG - AAA [place] - 23 valent pneumonia vaccine  Essential hypertension Continue medications Monitor blood pressure at home; call if consistently over 130/80 Continue DASH diet.   Reminder to go to the ER if any CP, SOB, nausea, dizziness, severe HA, changes vision/speech, left arm numbness and tingling and jaw pain.  OSA (obstructive sleep apnea) Years ago, couldn't tolerate mask   Chronic obstructive pulmonary disease, unspecified COPD type ([place]) Advised to stop smoking, CXR, continue meds.   Irritable bowel syndrome, unspecified type Avoid triggers, bowel management discussed  Gastroesophageal reflux disease, esophagitis presence not specified Well managed on current medications Discussed diet, avoiding triggers and other lifestyle changes  Controlled type 2 diabetes mellitus with chronic kidney disease, without long-term current use of insulin, unspecified CKD stage [facility]) Education: Reviewed 'ABCs' of diabetes management (respective goals in parentheses):  A1C (<7), blood pressure (<130/80), and cholesterol (LDL <70) Eye Exam yearly and Dental Exam every 6 months. Dietary recommendations Physical Activity recommendations  Bell's palsy Remote, no recent inssues  Intramural leiomyoma of uterus Followed by GYN annually   Vitamin D deficiency Continue supplementation Check vitamin D level  Tobacco abuse Cutting back, declines medication  Seasonal allergies Continue OTC allergy pills  Obesities, morbid ([place]) Long discussion about weight loss, diet, and exercise Recommended diet heavy in fruits and veggies and low in animal meats, cheeses, and dairy products, appropriate calorie intake Discussed appropriate weight for height  Patient on phentermine with  no SE, reminded to take drug breaks; continue close follow up. Suggested  15 min minimum exercise daily such as cycling, track calories/macros via Lose it! Or MyfitnessPAL app Follow up at next visit  Medication management CBC, CMP/GFR, magnesium  Iron deficiency anemia, unspecified iron deficiency anemia type CBC  Mixed hyperlipidemia Continue medications Continue low cholesterol diet and exercise.  Check lipid panel.   Depression, major, in remission ([place]) Continue medications  Lifestyle discussed: diet/exerise, sleep hygiene, stress management, hydration  Compulsive tobacco user syndrome Currently cutting back, declines medication  Osteopenia of multiple sites UTD on dexa, get in 2 years, continue vit D, emphasized weight bearing exercise  Urgency/frequency - UA with reflex culture  Over 40 minutes of exam, counseling, chart review and critical decision making was performed Future Appointments  Date Time Provider [place]  12/16/2017  1:00 PM Regina Eck, CNM [place] None  02/15/2018  2:30 PM [name], MD [facility] None  09/05/2018 10:00 AM [name], MD [facility] None     Plan:   During the course of the visit the patient was educated and counseled about appropriate screening and preventive services including:    Pneumococcal vaccine   Prevnar 13  Influenza vaccine  Td vaccine  Screening electrocardiogram  Bone densitometry screening  Colorectal cancer screening  Diabetes screening  Glaucoma screening  Nutrition counseling   Advanced directives: requested   Subjective:  Beth [name] is a 67 y.o. female who presents for Medicare Annual Wellness Visit and 3 month follow up. 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  She's also been seen by Dr Lynann Bologna and patient has received  EDSI's x 3  by Dr Mina Marble. Last Surgery was L4-5 Fusion by Dr Sherley Bounds in July 2018. Patient has GERD controlled on her meds.  She is  concerned today about possible UTI, has had frequency and urgency x 3 days. Denies nausea, fever/chills, cannot comment on urine character.   she currently continues to smoke 1/2 pack a day; discussed risks associated with smoking, patient is ready to quit, and is cutting back gradually. Has declined medications thus far.    She has hx of major depression in remission on effexor; she is also prescribed xanax 0.25 mg BID PRN breakthrough anxiety,  and takes sonata at night for sleep.   she is prescribed phentermine for weight loss.  While on the medication they have lost 0 lbs since last visit. They deny palpitations, anxiety, trouble sleeping, elevated BP.   BMI is Body mass index is 45.06 kg/m., she has not been working on exercise due to back surgery, she did do extended PT, thinks r/t discrepancy in leg length, has lifts.  Wt Readings from Last 3 Encounters:  11/11/17 270 lb 12.8 oz (122.8 kg)  08/04/17 259 lb (117.5 kg)  05/06/17 242 lb (109.8 kg)    Her blood pressure has been controlled at home, today their BP is BP: 114/74 She does not workout. She denies chest pain, shortness of breath, dizziness.   She is on cholesterol medication (simvastatin 40 mg daily) and denies myalgias. Her cholesterol is at goal. The cholesterol last visit was:   Lab Results  Component Value Date   CHOL 174 08/04/2017   HDL 67 08/04/2017   LDLCALC 88 08/04/2017   TRIG 96 08/04/2017   CHOLHDL 2.6 08/04/2017    She has not been working on diet and exercise for T2DM currently treated by lifestyle modification, and denies foot ulcerations, increased appetite, nausea, polydipsia, polyuria, visual disturbances and vomiting. Last A1C in the office was:  Lab Results  Component Value Date   HGBA1C 6.3 (H) 08/04/2017   Last GFR: Lab Results  Component Value Date   GFRNONAA 79 08/04/2017   Patient is on Vitamin D supplement.   Lab Results  Component Value Date   VD25OH 52 08/04/2017      Medication  Review: Current Outpatient Medications on File Prior to Visit  Medication Sig Dispense Refill  . acetaminophen (TYLENOL) 500 MG tablet Take 500 mg by mouth 2 (two) times daily.    Marland Kitchen albuterol (PROVENTIL HFA;VENTOLIN HFA) 108 (90 Base) MCG/ACT inhaler Inhale 2 puffs into the lungs every 6 (six) hours as needed for wheezing. 18 g 1  . ALPRAZolam (XANAX) 0.25 MG tablet Take 1/2 to 1 tablet 2 x / day if needed for anxiety. (Patient taking differently: Take 0.25 mg by mouth 2 (two) times daily. ) 180 tablet 1  . aspirin 81 MG tablet Take 81 mg by mouth at bedtime.     . Biotin 5000 MCG CAPS Take by mouth.    . Calcium Carbonate-Vit D-Min (CALCIUM 1200 PO) Take by mouth.    . cephALEXin (KEFLEX) 500 MG capsule Take 4 capsules 2 hours prior to Dental Procedure 12 capsule 0  . cetirizine (ZYRTEC) 10 MG tablet Take 10 mg by mouth daily.    . Cholecalciferol (D3-1000) 1000 units capsule Take 1,000 Units by mouth daily.    Marland Kitchen CINNAMON PO Take 1,500 mg by mouth.    . cyclobenzaprine (FLEXERIL) 10 MG tablet TAKE 1/2 TO 1 TABLET BY  MOUTH 3 TIMES A DAY ONLY IF NEEDED FOR MUSCLE SPASM (Patient taking differently: Take 10mg s by mouth daily at night, may take an additional 10 mg daily as needed for muscle spasms) 270 tablet  3  . docusate sodium (COLACE) 100 MG capsule Take 100 mg by mouth daily as needed for mild constipation.    . fluticasone (FLONASE) 50 MCG/ACT nasal spray Place 2 sprays into the nose daily as needed for allergies.     Marland Kitchen glucose blood (ONETOUCH VERIO) test strip Check blood sugar 1 time daily-DX-E11.22. 100 each 4  . hyoscyamine (LEVBID) 0.375 MG 12 hr tablet TAKE 1 TABLET BY MOUTH TWICE DAILY 180 tablet 1  . Magnesium 250 MG TABS Take 250 mg by mouth daily.     . Melatonin 10 MG TABS Take 10 mg by mouth at bedtime.    . metoCLOPramide (REGLAN) 10 MG tablet TAKE 1 TABLET BY MOUTH FOUR TIMES DAILY EVERY NIGHT BEFORE A MEAL AND AT BEDTIME FOR ACID REFLUX 360 tablet 0  . montelukast (SINGULAIR)  10 MG tablet TAKE 1 TABLET BY MOUTH EVERY DAY 90 tablet 0  . olmesartan-hydrochlorothiazide (BENICAR HCT) 40-12.5 MG tablet TAKE 1 TABLET BY MOUTH ONCE DAILY (Patient taking differently: Takes 1/2 tablet daily) 90 tablet 1  . ONETOUCH DELICA LANCETS 03K MISC Check blood sugar 1 time daily-DX-E11.22 100 each 4  . phentermine (ADIPEX-P) 37.5 MG tablet Take 1/2 to 1 tablet every morning for dieting & weight  loss 30 tablet 5  . Phenylephrine-Guaifenesin (MUCUS RELIEF PE PO) Take 1 tablet by mouth daily.    . Probiotic Product (PROBIOTIC DAILY PO) Take by mouth.    . ranitidine (ZANTAC) 150 MG tablet Take 1 tablet (150 mg total) by mouth 2 (two) times daily. 180 tablet 1  . simvastatin (ZOCOR) 40 MG tablet TAKE 1 TABLET BY MOUTH  EVERY EVENING 90 tablet 3  . venlafaxine XR (EFFEXOR-XR) 150 MG 24 hr capsule Take 150 mg by mouth 2 (two) times daily. Patient gets RX from a different provider.    . zaleplon (SONATA) 10 MG capsule Take 10 mg by mouth at bedtime as needed for sleep.      No current facility-administered medications on file prior to visit.     Allergies  Allergen Reactions  . Penicillins Anaphylaxis and Rash    Childhood allergy   . Sulfa Antibiotics Shortness Of Breath    Also light headed, rash  . Celebrex [Celecoxib]     Elevates Blood Pressure   . Ciprofloxacin Nausea And Vomiting  . Codeine Nausea And Vomiting    And hydrocodone  . Levaquin [Levofloxacin] Nausea And Vomiting  . Motrin Ib [Ibuprofen] Hives  . Prempro [Conj Estrog-Medroxyprogest Ace] Hives    Current Problems (verified) Patient Active Problem List   Diagnosis Date Noted  . Osteopenia 11/09/2017  . Seasonal allergies 12/15/2016  . COPD (chronic obstructive pulmonary disease) ([place]) 12/20/2014  . Fibroid uterus 12/20/2014  . Obesities, morbid ([place]) 11/19/2014  . OSA (obstructive sleep apnea) 11/19/2014  . GERD  11/19/2014  . Hyperlipidemia 09/27/2013  . Medication management 09/27/2013  . T2_NIDDM  w/CKD 2 (GFR 86 ml/min)  ([facility])   . Vitamin D deficiency   . Iron deficiency anemia 06/17/2012  . Compulsive tobacco user syndrome 06/16/2012  . Tobacco abuse 06/16/2012  . Essential hypertension 06/02/2012  . Depression, major, in remission ([place]) 04/09/1988  . IBS (irritable bowel syndrome) 02/07/1988  . Bell's palsy 01/07/1985    Screening Tests Immunization History  Administered Date(s) Administered  . DT 05/21/2015  . Influenza,inj,quad, With Preservative 12/09/2015  . Influenza-Unspecified 12/07/2016  . PPD Test 03/21/2013, 05/21/2015  . Pneumococcal Conjugate-13 01/04/2014  . Pneumococcal Polysaccharide-23 03/09/2004  .  Td 03/09/2004  . Tdap 03/10/2015  . Typhoid Inactivated 05/10/2014  . Zoster 12/22/2012    Preventative care: Last colonoscopy: 10/12/2012 - rec 5 year follow up ? Last mammogram: 01/2017 [facility] Last pap smear/pelvic exam: 12/2016 with GYN, goes annually  DEXA: 01/2017 - osteopenia  Prior vaccinations: TD or Tdap: 2017  Influenza: 2018 Pneumococcal: 2006 Prevnar13: 2015 Shingles/Zostavax: 2014  Names of Other Physician/Practitioners you currently use: 1. [facility] here for primary care 2. Dr. Delman Cheadle, eye doctor, last visit 2019 3. Dr. Otila Kluver, dentist, last visit 2019  Patient Care Team: [name], MD as PCP - General (Internal Medicine)  SURGICAL HISTORY She  has a past surgical history that includes Colonoscopy (05/2005); Total knee arthroplasty (Left, 2012); Bladder suspension (03/2006, 07/2006, 01/2007); Total knee revision (2014); Tonsillectomy and adenoidectomy; Joint replacement; and Lumbar fusion (Right, 09/2016). FAMILY HISTORY Her family history includes CAD (age of onset: 92) in her paternal grandfather; Dementia in her mother; Diabetes in her father; Hypertension in her father; Parkinson's disease in her father. SOCIAL HISTORY She  reports that she has been smoking cigarettes. She has a 20.00  pack-year smoking history. She has never used smokeless tobacco. She reports that she does not drink alcohol or use drugs.   MEDICARE WELLNESS OBJECTIVES: Physical activity:   Cardiac risk factors:   Depression/mood screen:   Depression screen [facility] 2/9 08/04/2017  Decreased Interest 0  Down, Depressed, Hopeless 0  PHQ - 2 Score 0    ADLs:  In your present state of health, do you have any difficulty performing the following activities: 08/04/2017 08/04/2017  Hearing? N N  Vision? N N  Difficulty concentrating or making decisions? N N  Walking or climbing stairs? N N  Dressing or bathing? N N  Doing errands, shopping? N N  Some recent data might be hidden     Cognitive Testing  Alert? Yes  Normal Appearance?Yes  Oriented to person? Yes  Place? Yes   Time? Yes  Recall of three objects?  Yes  Can perform simple calculations? Yes  Displays appropriate judgment?Yes  Can read the correct time from a watch face?Yes  EOL planning:    Review of Systems  Constitutional: Negative for malaise/fatigue and weight loss.  HENT: Negative for hearing loss and tinnitus.   Eyes: Negative for blurred vision and double vision.  Respiratory: Negative for cough, sputum production, shortness of breath and wheezing.   Cardiovascular: Negative for chest pain, palpitations, orthopnea, claudication, leg swelling and PND.  Gastrointestinal: Negative for abdominal pain, blood in stool, constipation, diarrhea, heartburn, melena, nausea and vomiting.  Genitourinary: Positive for frequency and urgency. Negative for dysuria, flank pain and hematuria.  Musculoskeletal: Positive for falls (unsteady gait due to unequal leg lenghts, has done PT). Negative for joint pain and myalgias.  Skin: Negative for rash.  Neurological: Negative for dizziness, tingling, sensory change, weakness and headaches.  Endo/Heme/Allergies: Negative for polydipsia.  Psychiatric/Behavioral: Negative.  Negative for depression, memory  loss, substance abuse and suicidal ideas. The patient is not nervous/anxious and does not have insomnia.   All other systems reviewed and are negative.    Objective:     Today's Vitals   11/11/17 1358  BP: 114/74  Pulse: 94  Temp: (!) 97.5 F (36.4 C)  SpO2: 98%  Weight: 270 lb 12.8 oz (122.8 kg)  Height: 5\' 5"  (1.651 m)   Body mass index is 45.06 kg/m.  General appearance: alert, no distress, WD/WN, female HEENT: normocephalic, sclerae  anicteric, TMs pearly, nares patent, no discharge or erythema, pharynx normal Oral cavity: MMM, no lesions Neck: supple, no lymphadenopathy, no thyromegaly, no masses Heart: RRR, normal S1, S2, no murmurs Lungs: CTA bilaterally, no wheezes, rhonchi, or rales Abdomen: +bs, soft, obese abdomen, non tender, non distended, no masses, no hepatomegaly, no splenomegaly Musculoskeletal: nontender, no swelling, no obvious deformity Extremities: no edema, no cyanosis, no clubbing Pulses: 2+ symmetric, upper and lower extremities, normal cap refill Neurological: alert, oriented x 3, CN2-12 intact, strength normal upper extremities and lower extremities, sensation normal throughout, DTRs 2+ throughout, no cerebellar signs, gait slow with cane Psychiatric: normal affect, behavior normal, pleasant   Medicare Attestation I have personally reviewed: The patient's medical and social history Their use of alcohol, tobacco or illicit drugs Their current medications and supplements The patient's functional ability including ADLs,fall risks, home safety risks, cognitive, and hearing and visual impairment Diet and physical activities Evidence for depression or mood disorders  The patient's weight, height, BMI, and visual acuity have been recorded in the chart.  I have made referrals, counseling, and provided education to the patient based on review of the above and I have provided the patient with a written personalized care plan for preventive services.      Izora Ribas, NP   11/11/2017

## 2017-11-11 ENCOUNTER — Encounter: Payer: Self-pay | Admitting: Adult Health

## 2017-11-11 ENCOUNTER — Ambulatory Visit (INDEPENDENT_AMBULATORY_CARE_PROVIDER_SITE_OTHER): Payer: Medicare Other | Admitting: Adult Health

## 2017-11-11 VITALS — BP 114/74 | HR 94 | Temp 97.5°F | Ht 65.0 in | Wt 270.8 lb

## 2017-11-11 DIAGNOSIS — M8589 Other specified disorders of bone density and structure, multiple sites: Secondary | ICD-10-CM

## 2017-11-11 DIAGNOSIS — Z0000 Encounter for general adult medical examination without abnormal findings: Secondary | ICD-10-CM

## 2017-11-11 DIAGNOSIS — Z79899 Other long term (current) drug therapy: Secondary | ICD-10-CM

## 2017-11-11 DIAGNOSIS — D251 Intramural leiomyoma of uterus: Secondary | ICD-10-CM

## 2017-11-11 DIAGNOSIS — D509 Iron deficiency anemia, unspecified: Secondary | ICD-10-CM

## 2017-11-11 DIAGNOSIS — Z720 Tobacco use: Secondary | ICD-10-CM

## 2017-11-11 DIAGNOSIS — I10 Essential (primary) hypertension: Secondary | ICD-10-CM

## 2017-11-11 DIAGNOSIS — G4733 Obstructive sleep apnea (adult) (pediatric): Secondary | ICD-10-CM

## 2017-11-11 DIAGNOSIS — E559 Vitamin D deficiency, unspecified: Secondary | ICD-10-CM

## 2017-11-11 DIAGNOSIS — Z23 Encounter for immunization: Secondary | ICD-10-CM | POA: Diagnosis not present

## 2017-11-11 DIAGNOSIS — F325 Major depressive disorder, single episode, in full remission: Secondary | ICD-10-CM

## 2017-11-11 DIAGNOSIS — Z0001 Encounter for general adult medical examination with abnormal findings: Secondary | ICD-10-CM | POA: Diagnosis not present

## 2017-11-11 DIAGNOSIS — R6889 Other general symptoms and signs: Secondary | ICD-10-CM | POA: Diagnosis not present

## 2017-11-11 DIAGNOSIS — K219 Gastro-esophageal reflux disease without esophagitis: Secondary | ICD-10-CM

## 2017-11-11 DIAGNOSIS — R300 Dysuria: Secondary | ICD-10-CM

## 2017-11-11 DIAGNOSIS — K589 Irritable bowel syndrome without diarrhea: Secondary | ICD-10-CM | POA: Diagnosis not present

## 2017-11-11 DIAGNOSIS — E1122 Type 2 diabetes mellitus with diabetic chronic kidney disease: Secondary | ICD-10-CM

## 2017-11-11 DIAGNOSIS — J449 Chronic obstructive pulmonary disease, unspecified: Secondary | ICD-10-CM

## 2017-11-11 DIAGNOSIS — Z136 Encounter for screening for cardiovascular disorders: Secondary | ICD-10-CM

## 2017-11-11 DIAGNOSIS — G510 Bell's palsy: Secondary | ICD-10-CM

## 2017-11-11 DIAGNOSIS — J302 Other seasonal allergic rhinitis: Secondary | ICD-10-CM

## 2017-11-11 DIAGNOSIS — F17200 Nicotine dependence, unspecified, uncomplicated: Secondary | ICD-10-CM

## 2017-11-11 DIAGNOSIS — E782 Mixed hyperlipidemia: Secondary | ICD-10-CM

## 2017-11-11 NOTE — Patient Instructions (Addendum)
Ms. Mayden , Thank you for taking time to come for your Medicare Wellness Visit. I appreciate your ongoing commitment to your health goals. Please review the following plan we discussed and let me know if I can assist you in the future.   These are the goals we discussed: Goals    . HEMOGLOBIN A1C < 6.0    . LDL CALC < 100    . Quit Smoking    . Weight (lb) < 210 lb (95.3 kg)       This is a list of the screening recommended for you and due dates:  Health Maintenance  Topic Date Due  . Pneumonia vaccines (2 of 2 - PPSV23) 11/05/2017  . Flu Shot  11/18/2017*  . Eye exam for diabetics  12/22/2017  . Hemoglobin A1C  02/04/2018  . Complete foot exam   08/05/2018  . Mammogram  02/04/2019  . Pap Smear  12/16/2019  . Colon Cancer Screening  10/13/2022  . Tetanus Vaccine  05/20/2025  . DEXA scan (bone density measurement)  Completed  .  Hepatitis C: One time screening is recommended by Center for Disease Control  (CDC) for  adults born from [birth year] through 1963.   Completed  . HIV Screening  Completed  *Topic was postponed. The date shown is not the original due date.    [employer] with no obligation # [phone] Do not have to be a member Tues-Sat 10-6  [facility]- free test with no obligation # [phone] MUST BE A MEMBER Call for store hours  Have had patient's get good cheaper hearing aids from [employer] The air version has good reviews.    Schedule follow up colonoscopy  Try tracking food intake with Lose it! App or MyFitnessPAL - also has great website with info  Try stationary cycle for exercise - start slow -10-15 min and work up to 30 min daily     Drink 1/2 your body weight in fluid ounces of water daily; drink a tall glass of water 30 min before meals  Don't eat until you're stuffed- listen to your stomach and eat until you are 80% full   Try eating off of a salad plate; wait 10 min after finishing before going back for  seconds  Start by eating the vegetables on your plate; aim for 50% of your meals to be fruits or vegetables  Then eat your protein - lean meats (grass fed if possible), fish, beans, nuts in moderation  Eat your carbs/starch last ONLY if you still are hungry. If you can, stop before finishing it all  Avoid sugar and flour - the closer it looks to it's original form in nature, typically the better it is for you  Splurge in moderation - "assign" days when you get to splurge and have the "bad stuff" - I like to follow a 80% - 20% plan- "good" choices 80 % of the time, "bad" choices in moderation 20% of the time  Simple equation is: Calories out > calories in = weight loss - even if you eat the bad stuff, if you limit portions, you will still lose weight   Know what a healthy weight is for you (roughly BMI <25) and aim to maintain this  Aim for 5-10+ servings of fruits and vegetables daily  65-80+ fluid ounces of water or unsweet tea for healthy kidneys  Limit to max 1 drink of alcohol per day; avoid smoking/tobacco  Limit animal  fats in diet for cholesterol and heart health - choose grass fed whenever available  Avoid highly processed foods, and foods high in saturated/trans fats  Aim for low stress - take time to unwind and care for your mental health  Aim for 150 min of moderate intensity exercise weekly for heart health, and weights twice weekly for bone health  Aim for 7-9 hours of sleep daily

## 2017-11-13 ENCOUNTER — Other Ambulatory Visit: Payer: Self-pay | Admitting: Internal Medicine

## 2017-11-13 LAB — CBC WITH DIFFERENTIAL/PLATELET
BASOS ABS: 57 {cells}/uL (ref 0–200)
Basophils Relative: 0.8 %
EOS ABS: 291 {cells}/uL (ref 15–500)
EOS PCT: 4.1 %
HEMATOCRIT: 39.2 % (ref 35.0–45.0)
Hemoglobin: 12.9 g/dL (ref 11.7–15.5)
LYMPHS ABS: 1811 {cells}/uL (ref 850–3900)
MCH: 28.5 pg (ref 27.0–33.0)
MCHC: 32.9 g/dL (ref 32.0–36.0)
MCV: 86.7 fL (ref 80.0–100.0)
MPV: 10.2 fL (ref 7.5–12.5)
Monocytes Relative: 8.1 %
NEUTROS PCT: 61.5 %
Neutro Abs: 4367 cells/uL (ref 1500–7800)
Platelets: 213 10*3/uL (ref 140–400)
RBC: 4.52 10*6/uL (ref 3.80–5.10)
RDW: 14.9 % (ref 11.0–15.0)
Total Lymphocyte: 25.5 %
WBC: 7.1 10*3/uL (ref 3.8–10.8)
WBCMIX: 575 {cells}/uL (ref 200–950)

## 2017-11-13 LAB — MAGNESIUM: Magnesium: 2 mg/dL (ref 1.5–2.5)

## 2017-11-13 LAB — COMPLETE METABOLIC PANEL WITH GFR
AG RATIO: 1.6 (calc) (ref 1.0–2.5)
ALT: 12 U/L (ref 6–29)
AST: 22 U/L (ref 10–35)
Albumin: 4.1 g/dL (ref 3.6–5.1)
Alkaline phosphatase (APISO): 129 U/L (ref 33–130)
BUN: 15 mg/dL (ref 7–25)
CALCIUM: 9.6 mg/dL (ref 8.6–10.4)
CO2: 32 mmol/L (ref 20–32)
CREATININE: 0.88 mg/dL (ref 0.50–0.99)
Chloride: 100 mmol/L (ref 98–110)
GFR, EST AFRICAN AMERICAN: 80 mL/min/{1.73_m2} (ref >=60)
GFR, Est Non African American: 69 mL/min/{1.73_m2} (ref >=60)
GLOBULIN: 2.6 g/dL (ref 1.9–3.7)
Glucose, Bld: 91 mg/dL (ref 65–99)
POTASSIUM: 4.7 mmol/L (ref 3.5–5.3)
SODIUM: 140 mmol/L (ref 135–146)
Total Bilirubin: 0.3 mg/dL (ref 0.2–1.2)
Total Protein: 6.7 g/dL (ref 6.1–8.1)

## 2017-11-13 LAB — URINALYSIS W MICROSCOPIC + REFLEX CULTURE
BACTERIA UA: NONE SEEN /HPF
Bilirubin Urine: NEGATIVE
Glucose, UA: NEGATIVE
Hyaline Cast: NONE SEEN /LPF
KETONES UR: NEGATIVE
Nitrites, Initial: NEGATIVE
Protein, ur: NEGATIVE
RBC / HPF: NONE SEEN /HPF (ref 0–2)
Specific Gravity, Urine: 1.016 (ref 1.001–1.03)
pH: 5.5 (ref 5.0–8.0)

## 2017-11-13 LAB — VITAMIN D 25 HYDROXY (VIT D DEFICIENCY, FRACTURES): Vit D, 25-Hydroxy: 49 ng/mL (ref 30–100)

## 2017-11-13 LAB — CULTURE INDICATED

## 2017-11-13 LAB — HEMOGLOBIN A1C
Hgb A1c MFr Bld: 6.4 % of total Hgb — ABNORMAL HIGH (ref <5.7)
Mean Plasma Glucose: 137 (calc)
eAG (mmol/L): 7.6 (calc)

## 2017-11-13 LAB — URINE CULTURE
MICRO NUMBER:: 91067453
SPECIMEN QUALITY:: ADEQUATE

## 2017-11-13 LAB — LIPID PANEL
CHOL/HDL RATIO: 2.9 (calc) (ref <5.0)
CHOLESTEROL: 181 mg/dL (ref <200)
HDL: 63 mg/dL (ref >50)
LDL CHOLESTEROL (CALC): 97 mg/dL
NON-HDL CHOLESTEROL (CALC): 118 mg/dL (ref <130)
Triglycerides: 117 mg/dL (ref <150)

## 2017-11-13 LAB — TSH: TSH: 3.67 m[IU]/L (ref 0.40–4.50)

## 2017-11-23 ENCOUNTER — Encounter: Payer: Self-pay | Admitting: Internal Medicine

## 2017-11-23 ENCOUNTER — Ambulatory Visit (INDEPENDENT_AMBULATORY_CARE_PROVIDER_SITE_OTHER): Payer: Medicare Other | Admitting: Internal Medicine

## 2017-11-23 VITALS — BP 122/84 | HR 80 | Temp 97.9°F | Resp 16 | Ht 65.0 in | Wt 268.6 lb

## 2017-11-23 DIAGNOSIS — L723 Sebaceous cyst: Secondary | ICD-10-CM | POA: Diagnosis not present

## 2017-11-23 DIAGNOSIS — I10 Essential (primary) hypertension: Secondary | ICD-10-CM | POA: Diagnosis not present

## 2017-11-23 NOTE — Progress Notes (Signed)
Subjective:    Patient ID: Beth [name], female    DOB: 1950/06/01, 67 y.o.   MRN: [id number]  HPI    Patient is a very nice 67 yo DWF with HTN presenting for F/U and systems review is negative for HA, postural dizziness, CP, palpitations, dyspnea or  Edema. She also has concerns regarding a dark skin lesion on her back which is tender.   Medication Sig  . acetaminophen (TYLENOL) 500 MG tablet Take 500 mg by mouth 2 (two) times daily.  Marland Kitchen albuterol (PROVENTIL HFA;VENTOLIN HFA) 108 (90 Base) MCG/ACT inhaler Inhale 2 puffs into the lungs every 6 (six) hours as needed for wheezing.  Marland Kitchen ALPRAZolam (XANAX) 0.25 MG tablet Take 1/2 to 1 tablet 2 x / day if needed for anxiety. (Patient taking differently: Take 0.25 mg by mouth 2 (two) times daily. )  . aspirin 81 MG tablet Take 81 mg by mouth at bedtime.   . Biotin 5000 MCG CAPS Take by mouth.  . Calcium Carbonate-Vit D-Min (CALCIUM 1200 PO) Take by mouth.  . cephALEXin (KEFLEX) 500 MG capsule Take 4 capsules 2 hours prior to Dental Procedure  . cetirizine (ZYRTEC) 10 MG tablet Take 10 mg by mouth daily.  . Cholecalciferol (D3-1000) 1000 units capsule Take 1,000 Units by mouth daily.  Marland Kitchen CINNAMON PO Take 1,500 mg by mouth.  . cyclobenzaprine (FLEXERIL) 10 MG tablet TAKE 1/2 TO 1 TABLET BY  MOUTH 3 TIMES A DAY ONLY IF NEEDED FOR MUSCLE SPASM (Patient taking differently: Take 10mg s by mouth daily at night, may take an additional 10 mg daily as needed for muscle spasms)  . docusate sodium (COLACE) 100 MG capsule Take 100 mg by mouth daily as needed for mild constipation.  . fluticasone (FLONASE) 50 MCG/ACT nasal spray Place 2 sprays into the nose daily as needed for allergies.   Marland Kitchen glucose blood (ONETOUCH VERIO) test strip Check blood sugar 1 time daily-DX-E11.22.  . hyoscyamine (LEVBID) 0.375 MG 12 hr tablet TAKE 1 TABLET BY MOUTH TWICE DAILY  . Magnesium 250 MG TABS Take 250 mg by mouth daily.   . Melatonin 10 MG TABS Take 10 mg by mouth at bedtime.   . metoCLOPramide (REGLAN) 10 MG tablet TAKE 1 TABLET BY MOUTH FOUR TIMES DAILY EVERY NIGHT BEFORE A MEAL AND AT BEDTIME FOR ACID REFLUX  . montelukast (SINGULAIR) 10 MG tablet TAKE 1 TABLET BY MOUTH EVERY DAY  . olmesartan-hydrochlorothiazide (BENICAR HCT) 40-12.5 MG tablet TAKE 1 TABLET BY MOUTH ONCE DAILY (Patient taking differently: Takes 1/2 tablet daily)  . ONETOUCH DELICA LANCETS 58N MISC Check blood sugar 1 time daily-DX-E11.22  . phentermine (ADIPEX-P) 37.5 MG tablet Take 1/2 to 1 tablet every morning for dieting & weight  loss  . Phenylephrine-Guaifenesin (MUCUS RELIEF PE PO) Take 1 tablet by mouth daily.  . Probiotic Product (PROBIOTIC DAILY PO) Take by mouth.  . ranitidine (ZANTAC) 150 MG tablet Take 1 tablet (150 mg total) by mouth 2 (two) times daily.  . simvastatin (ZOCOR) 40 MG tablet TAKE 1 TABLET BY MOUTH  EVERY EVENING  . venlafaxine XR (EFFEXOR-XR) 150 MG 24 hr capsule Take 150 mg by mouth 2 (two) times daily. Patient gets RX from a different provider.  . zaleplon (SONATA) 10 MG capsule Take 10 mg by mouth at bedtime as needed for sleep.    Allergies  Allergen Reactions  . Penicillins Anaphylaxis and Rash    Childhood allergy   . Sulfa Antibiotics Shortness Of Breath  Also light headed, rash  . Celebrex [Celecoxib]     Elevates Blood Pressure   . Ciprofloxacin Nausea And Vomiting  . Codeine Nausea And Vomiting    And hydrocodone  . Levaquin [Levofloxacin] Nausea And Vomiting  . Motrin Ib [Ibuprofen] Hives  . Prempro [Conj Estrog-Medroxyprogest Ace] Hives   Past Medical History:  Diagnosis Date  . Anemia   . Anxiety   . Bell's palsy 01/1985  . COPD (chronic obstructive pulmonary disease) ([place])   . Depression   . Diabetes mellitus without complication ([place])   . GERD (gastroesophageal reflux disease)   . Hypertension   . IBS (irritable bowel syndrome) 12/89  . Obesity, Class III, BMI 40-49.9 (morbid obesity) ([place])   . OSA (obstructive sleep apnea)    no  cpap  . Vitamin D deficiency    Past Surgical History:  Procedure Laterality Date  . BLADDER SUSPENSION  03/2006, 07/2006, 01/2007   for stress incontinence  . COLONOSCOPY  05/2005   Normal, recheck 10 yrs  . JOINT REPLACEMENT     x3 lt knee  . LUMBAR FUSION Right 09/2016   L4-5 fusion by Dr. Sherley Bounds  . TONSILLECTOMY AND ADENOIDECTOMY    . TOTAL KNEE ARTHROPLASTY Left 2012   x 2 left knee  . TOTAL KNEE REVISION  2014   Review of Systems    10 point systems review negative except as above.    Objective:   Physical Exam  BP 122/84   Pulse 80   Temp 97.9 F (36.6 C)   Resp 16   Ht 5\' 5"  (1.651 m)   Wt 268 lb 9.6 oz (121.8 kg)   LMP 04/09/2009   BMI 44.70 kg/m   HEENT - WNL. Neck - supple.  Chest - Clear equal BS. Cor - Nl HS. RRR w/o sig MGR. PP 1(+). No edema. MS- FROM w/o deformities.  Gait Nl. Neuro -  Nl w/o focal abnormalities. Skin -  There is a sl raised skin cyst in the mid back area measuring approx 110 mm.   Procedure (CPT- 29562)     After informed consent & aseptic prep with alcohol, the area of the cyst was anesthetized with 1 ml of Marcaine 0.5% and then the cyst was sharply incised and sebaceous contents expurgated and cyst cavity was debrided of contents. Sterile bandage was applied and wound care advised.      Assessment & Plan:   1. Essential hypertension  2. Sebaceous cyst

## 2017-12-09 ENCOUNTER — Other Ambulatory Visit: Payer: Self-pay | Admitting: Adult Health

## 2017-12-09 DIAGNOSIS — K219 Gastro-esophageal reflux disease without esophagitis: Secondary | ICD-10-CM

## 2017-12-16 ENCOUNTER — Encounter: Payer: Self-pay | Admitting: Certified Nurse Midwife

## 2017-12-16 ENCOUNTER — Other Ambulatory Visit: Payer: Self-pay

## 2017-12-16 ENCOUNTER — Ambulatory Visit (INDEPENDENT_AMBULATORY_CARE_PROVIDER_SITE_OTHER): Payer: Medicare Other | Admitting: Certified Nurse Midwife

## 2017-12-16 VITALS — BP 122/84 | HR 70 | Resp 16 | Ht 63.75 in | Wt 265.0 lb

## 2017-12-16 DIAGNOSIS — N952 Postmenopausal atrophic vaginitis: Secondary | ICD-10-CM | POA: Diagnosis not present

## 2017-12-16 DIAGNOSIS — N3941 Urge incontinence: Secondary | ICD-10-CM

## 2017-12-16 DIAGNOSIS — Z01419 Encounter for gynecological examination (general) (routine) without abnormal findings: Secondary | ICD-10-CM | POA: Diagnosis not present

## 2017-12-16 DIAGNOSIS — Z86018 Personal history of other benign neoplasm: Secondary | ICD-10-CM | POA: Diagnosis not present

## 2017-12-16 DIAGNOSIS — N852 Hypertrophy of uterus: Secondary | ICD-10-CM

## 2017-12-16 NOTE — Progress Notes (Signed)
67 y.o. [id number] Divorced  Caucasian Fe here for annual exam. Menopausal denies vaginal bleeding or vaginal dryness changes. Having some trouble with chafing in vaginal and groin area. Using Zeezorb powder with good results. Has noted increasing loss or urine and concerns with holding urine.No bowel incontinence. History of uterine fibroids and has not noted changes.Sees PCP four times yearly for glucose, hypertension,cholesterol,anxiety/depression medication management and allergy/asthma issues, labs. Sees GI for GERD management. Due for colonoscopy with Dr. Collene Mares. Patient having balance issues now with weight gain and has done PT and now walks with cane.  Still smoking but not as much. Some social stress with family, but coping well. No other health issues today.  Patient's last menstrual period was 04/09/2009.          Sexually active: No.  The current method of family planning is post menopausal status.    Exercising: Yes.    bike Smoker:  yes  Review of Systems  Constitutional: Negative.        Weight gain  HENT: Negative.   Eyes: Negative.   Respiratory: Negative.   Cardiovascular: Negative.   Gastrointestinal: Negative.   Genitourinary: Negative.   Musculoskeletal:       Muscle weakness, muscle/joint pain  Skin: Negative.   Neurological: Negative.   Endo/Heme/Allergies: Negative.   Psychiatric/Behavioral: Negative.     Health Maintenance: Pap:  12-05-13 neg HPV HR neg, 12-15-16 neg History of Abnormal Pap: no MMG:  02-03-17 category a density birads 1:neg Self Breast exams: yes Colonoscopy:  2014 polypf f/u 14yrs BMD:   2018 TDaP:  2017 Shingles: 2014 Pneumonia: 2019 Hep C and HIV: both neg per patient Labs: PCP   reports that she has been smoking cigarettes. She has a 20.00 pack-year smoking history. She has never used smokeless tobacco. She reports that she does not drink alcohol or use drugs.  Past Medical History:  Diagnosis Date  . Anemia   . Anxiety   . Bell's  palsy 01/1985  . COPD (chronic obstructive pulmonary disease) ([place])   . Depression   . Diabetes mellitus without complication ([place])   . GERD (gastroesophageal reflux disease)   . Hypertension   . IBS (irritable bowel syndrome) 12/89  . Obesity, Class III, BMI 40-49.9 (morbid obesity) ([place])   . OSA (obstructive sleep apnea)    no cpap  . Vitamin D deficiency     Past Surgical History:  Procedure Laterality Date  . BLADDER SUSPENSION  03/2006, 07/2006, 01/2007   for stress incontinence  . COLONOSCOPY  05/2005   Normal, recheck 10 yrs  . JOINT REPLACEMENT     x3 lt knee  . LUMBAR FUSION Right 09/2016   L4-5 fusion by Dr. Sherley Bounds  . TONSILLECTOMY AND ADENOIDECTOMY    . TOTAL KNEE ARTHROPLASTY Left 2012   x 2 left knee  . TOTAL KNEE REVISION  2014    Current Outpatient Medications  Medication Sig Dispense Refill  . acetaminophen (TYLENOL) 500 MG tablet Take 500 mg by mouth 2 (two) times daily.    Marland Kitchen albuterol (PROVENTIL HFA;VENTOLIN HFA) 108 (90 Base) MCG/ACT inhaler Inhale 2 puffs into the lungs every 6 (six) hours as needed for wheezing. 18 g 1  . ALPRAZolam (XANAX) 0.25 MG tablet Take 1/2 to 1 tablet 2 x / day if needed for anxiety. (Patient taking differently: Take 0.25 mg by mouth 2 (two) times daily. ) 180 tablet 1  . aspirin 81 MG tablet Take 81 mg by mouth at bedtime.     Marland Kitchen  Biotin 5000 MCG CAPS Take by mouth.    . Calcium Carbonate-Vit D-Min (CALCIUM 1200 PO) Take by mouth.    . cephALEXin (KEFLEX) 500 MG capsule Take 4 capsules 2 hours prior to Dental Procedure 12 capsule 0  . cetirizine (ZYRTEC) 10 MG tablet Take 10 mg by mouth daily.    . Cholecalciferol (D3-1000) 1000 units capsule Take 1,000 Units by mouth daily.    Marland Kitchen CINNAMON PO Take 1,500 mg by mouth.    . cyclobenzaprine (FLEXERIL) 10 MG tablet TAKE 1/2 TO 1 TABLET BY  MOUTH 3 TIMES A DAY ONLY IF NEEDED FOR MUSCLE SPASM (Patient taking differently: Take 10mg s by mouth daily at night, may take an additional 10 mg  daily as needed for muscle spasms) 270 tablet 3  . docusate sodium (COLACE) 100 MG capsule Take 100 mg by mouth daily as needed for mild constipation.    . fluticasone (FLONASE) 50 MCG/ACT nasal spray Place 2 sprays into the nose daily as needed for allergies.     Marland Kitchen glucose blood (ONETOUCH VERIO) test strip Check blood sugar 1 time daily-DX-E11.22. 100 each 4  . hyoscyamine (LEVBID) 0.375 MG 12 hr tablet TAKE 1 TABLET BY MOUTH TWICE DAILY 180 tablet 1  . Magnesium 250 MG TABS Take 250 mg by mouth daily.     . Melatonin 10 MG TABS Take 10 mg by mouth at bedtime.    . metoCLOPramide (REGLAN) 10 MG tablet TAKE 1 TABLET BY MOUTH FOUR TIMES DAILY EVERY NIGHT BEFORE A MEAL AND AT BEDTIME FOR ACID REFLUX 360 tablet 0  . montelukast (SINGULAIR) 10 MG tablet TAKE 1 TABLET BY MOUTH EVERY DAY 90 tablet 3  . olmesartan-hydrochlorothiazide (BENICAR HCT) 40-12.5 MG tablet TAKE 1 TABLET BY MOUTH ONCE DAILY (Patient taking differently: Takes 1/2 tablet daily) 90 tablet 1  . ONETOUCH DELICA LANCETS 94W MISC Check blood sugar 1 time daily-DX-E11.22 100 each 4  . phentermine (ADIPEX-P) 37.5 MG tablet Take 1/2 to 1 tablet every morning for dieting & weight  loss 30 tablet 5  . Phenylephrine-Guaifenesin (MUCUS RELIEF PE PO) Take 1 tablet by mouth daily.    . Probiotic Product (PROBIOTIC DAILY PO) Take by mouth.    . ranitidine (ZANTAC) 150 MG tablet TAKE 1 TABLET(150 MG) BY MOUTH TWICE DAILY 180 tablet 0  . simvastatin (ZOCOR) 40 MG tablet TAKE 1 TABLET BY MOUTH  EVERY EVENING 90 tablet 3  . venlafaxine XR (EFFEXOR-XR) 150 MG 24 hr capsule Take 150 mg by mouth 2 (two) times daily. Patient gets RX from a different provider.    . zaleplon (SONATA) 10 MG capsule Take 10 mg by mouth at bedtime as needed for sleep.      No current facility-administered medications for this visit.     Family History  Problem Relation Age of Onset  . Hypertension Father   . Diabetes Father   . Parkinson's disease Father   . Dementia  Mother   . Heart attack Paternal Grandfather 63       Died MI    ROS:  Pertinent items are noted in HPI.  Otherwise, a comprehensive ROS was negative.  Exam:   LMP 04/09/2009    Ht Readings from Last 3 Encounters:  11/23/17 5\' 5"  (1.651 m)  11/11/17 5\' 5"  (1.651 m)  08/04/17 5\' 5"  (1.651 m)    General appearance: alert, cooperative and appears stated age Head: Normocephalic, without obvious abnormality, atraumatic Neck: no adenopathy, supple, symmetrical, trachea midline and thyroid normal  to inspection and palpation Lungs: clear to auscultation bilaterally Breasts: normal appearance, no masses or tenderness, No nipple retraction or dimpling, No nipple discharge or bleeding, No axillary or supraclavicular adenopathy, pendulous bilateral Heart: regular rate and rhythm Abdomen: soft, non-tender; no masses,  no organomegaly Extremities: extremities normal, atraumatic, no cyanosis or edema Skin: Skin color, texture, turgor normal. No rashes or lesions Lymph nodes: Cervical, supraclavicular, and axillary nodes normal. No abnormal inguinal nodes palpated Neurologic: Grossly normal   Pelvic: External genitalia:  no lesions, increase pink skin in groin areas with powder residue noted, no scaling or exudate noted, no ulcerations              Urethra:  normal appearing urethra with no masses, tenderness or lesions              Bartholin's and Skene's: normal                 Vagina: normal appearing vagina with normal color and discharge, no lesions              Cervix: no cervical motion tenderness and no lesions              Pap taken: No. Bimanual Exam:  Uterus:  enlarged, 12 week size,difficult to palpate due body habitus, history of fibroids weeks size              Adnexa: normal adnexa, no mass, fullness, tenderness and difficult to palpate due to body habitus               Rectovaginal: Confirms               Anus:  normal sphincter tone, no lesions  Chaperone present: yes  A:   Well Woman with normal exam  Menopausal no HRT  Known enlarged uterus with fibroid history, no size change?  Groin perspiration irritation using powder with some relief  Urinary incontinence sporadic and during night wearing depends, previous bladder sling with [facility]  Hypertension,diabetes, cholesterol, anxiety/depression with PCP management  Balance issues with vertigo at times   P:   Reviewed health and wellness pertinent to exam  Discussed importance of advising if vaginal bleeding.  Discussed uterus still enlarged, does not feel like a change and she has not noted any change.  Discussed sleeping with out underwear when possible and using Corn starch alternating with her Antifungal powder to see if this will resolve or help with problem. Patient will call if continues.  Discussed do not feel pessary would be option due to vaginal exam. Discussed consult with Dr. Quincy Simmonds regarding options with incontinence. Discussed possible urodynamic testing also. Patient will schedule appointment with her prior to leaving.  Continue follow up with PCP as indicated  Will plan to use procedure room with next exam due to difficulty getting up on table this year..  Pap smear: no   counseled on breast self exam, mammography screening, feminine hygiene, menopause, adequate intake of calcium and vitamin D, diet and exercise, Kegel's exercises  return annually or prn  An After Visit Summary was printed and given to the patient.

## 2017-12-16 NOTE — Patient Instructions (Signed)

## 2017-12-27 ENCOUNTER — Other Ambulatory Visit: Payer: Self-pay

## 2017-12-27 ENCOUNTER — Encounter: Payer: Self-pay | Admitting: Obstetrics and Gynecology

## 2017-12-27 ENCOUNTER — Ambulatory Visit (INDEPENDENT_AMBULATORY_CARE_PROVIDER_SITE_OTHER): Payer: Medicare Other | Admitting: Obstetrics and Gynecology

## 2017-12-27 VITALS — BP 134/82 | HR 92 | Resp 18 | Ht 64.0 in | Wt 262.2 lb

## 2017-12-27 DIAGNOSIS — T83712A Erosion of implanted urethral mesh to surrounding organ or tissue, initial encounter: Secondary | ICD-10-CM | POA: Diagnosis not present

## 2017-12-27 DIAGNOSIS — N3946 Mixed incontinence: Secondary | ICD-10-CM | POA: Diagnosis not present

## 2017-12-27 DIAGNOSIS — R1031 Right lower quadrant pain: Secondary | ICD-10-CM | POA: Diagnosis not present

## 2017-12-27 DIAGNOSIS — G8929 Other chronic pain: Secondary | ICD-10-CM

## 2017-12-27 DIAGNOSIS — D259 Leiomyoma of uterus, unspecified: Secondary | ICD-10-CM

## 2017-12-27 NOTE — Progress Notes (Signed)
GYNECOLOGY  VISIT   HPI: 67 y.o.   Divorced  Caucasian  female   [phone] with Patient's last menstrual period was 04/09/2009.  here for evaluation of urinary incontinence.  Has urinary urgency and frequency.  DF - every 30 - 45 minutes.  NF - none.  No enuresis.   Loss of urine spontaneously.  Difficulty getting to the bathroom on time at night.   Wears a pad some of the time.  Has an ongoing rash.    No dysuria.  No hx UTIs.  Remote hx of pyelonephritis.  No renal stones.   States she has some lower abdominal pain.   Leaks with cough, sneeze, standing up.  Wonders if she is unable to empty her bladder.   Has constipation.  Uses Colace every 2 days.  States she needs to have colonoscopy.  Has some fecal incontinence.  Does not use Metamucil or fiber.   Has known enlarged uterus, 12 week size on recent exam on 12/16/17.  Had a bladder sling attempt with Dr. Matilde Sprang in 2008 and then successful Spark in 2010.  She had mixed incontinence.  She was significantly better after her surgery. She is better today than prior to surgery years ago.   Did PT in the past through [facility].   Has not seen Dr. Matilde Sprang in many years.   GYNECOLOGIC HISTORY: Patient's last menstrual period was 04/09/2009. Contraception:  Postmenopausal Menopausal hormone therapy: none Last mammogram:  02-03-17 category a density birads 1:neg Last pap smear:  12-15-16 neg,   12-05-13 neg HPV HR neg        OB History    Gravida  3   Para  1   Term  1   Preterm      AB  2   Living  1     SAB      TAB  2   Ectopic      Multiple      Live Births                 Patient Active Problem List   Diagnosis Date Noted  . Osteopenia 11/09/2017  . Seasonal allergies 12/15/2016  . COPD (chronic obstructive pulmonary disease) ([place]) 12/20/2014  . Fibroid uterus 12/20/2014  . Obesities, morbid ([place]) 11/19/2014  . OSA (obstructive sleep apnea) 11/19/2014  . GERD  11/19/2014  .  Hyperlipidemia 09/27/2013  . Medication management 09/27/2013  . 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 Vitamin D deficiency   . Iron deficiency anemia 06/17/2012  . Compulsive tobacco user syndrome 06/16/2012  . Tobacco abuse 06/16/2012  . Essential hypertension 06/02/2012  . Depression, major, in remission ([place]) 04/09/1988  . IBS (irritable bowel syndrome) 02/07/1988  . Bell's palsy 01/07/1985    Past Medical History:  Diagnosis Date  . Anemia   . Anxiety   . Bell's palsy 01/1985  . COPD (chronic obstructive pulmonary disease) ([place])   . Depression   . Diabetes mellitus without complication ([employer])   . GERD (gastroesophageal reflux disease)   . Hypertension   . IBS (irritable bowel syndrome) 12/89  . Obesity, Class III, BMI 40-49.9 (morbid obesity) ([place])   . OSA (obstructive sleep apnea)    no cpap  . Vitamin D deficiency     Past Surgical History:  Procedure Laterality Date  . BLADDER SUSPENSION  03/2006, 07/2006, 01/2007   for stress incontinence  . COLONOSCOPY  05/2005   Normal, recheck 10 yrs  .  JOINT REPLACEMENT     x3 lt knee  . LUMBAR FUSION Right 09/2016   L4-5 fusion by Dr. Sherley Bounds  . TONSILLECTOMY AND ADENOIDECTOMY    . TOTAL KNEE ARTHROPLASTY Left 2012   x 2 left knee  . TOTAL KNEE REVISION  2014    Current Outpatient Medications  Medication Sig Dispense Refill  . acetaminophen (TYLENOL) 500 MG tablet Take 500 mg by mouth 2 (two) times daily.    Marland Kitchen albuterol (PROVENTIL HFA;VENTOLIN HFA) 108 (90 Base) MCG/ACT inhaler Inhale 2 puffs into the lungs every 6 (six) hours as needed for wheezing. 18 g 1  . ALPRAZolam (XANAX) 0.25 MG tablet Take 1/2 to 1 tablet 2 x / day if needed for anxiety. (Patient taking differently: Take 0.25 mg by mouth 2 (two) times daily. ) 180 tablet 1  . aspirin 81 MG tablet Take 81 mg by mouth at bedtime.     . Biotin 5000 MCG CAPS Take by mouth.    . Calcium Carbonate-Vit D-Min (CALCIUM 1200 PO) Take by mouth.    . cephALEXin  (KEFLEX) 500 MG capsule Take 4 capsules 2 hours prior to Dental Procedure 12 capsule 0  . cetirizine (ZYRTEC) 10 MG tablet Take 10 mg by mouth daily.    . Cholecalciferol (D3-1000) 1000 units capsule Take 1,000 Units by mouth daily.    Marland Kitchen CINNAMON PO Take 1,500 mg by mouth.    . cyclobenzaprine (FLEXERIL) 10 MG tablet TAKE 1/2 TO 1 TABLET BY  MOUTH 3 TIMES A DAY ONLY IF NEEDED FOR MUSCLE SPASM (Patient taking differently: Take 10mg s by mouth daily at night, may take an additional 10 mg daily as needed for muscle spasms) 270 tablet 3  . docusate sodium (COLACE) 100 MG capsule Take 100 mg by mouth daily as needed for mild constipation.    . fluticasone (FLONASE) 50 MCG/ACT nasal spray Place 2 sprays into the nose daily as needed for allergies.     Marland Kitchen glucose blood (ONETOUCH VERIO) test strip Check blood sugar 1 time daily-DX-E11.22. 100 each 4  . hyoscyamine (LEVBID) 0.375 MG 12 hr tablet TAKE 1 TABLET BY MOUTH TWICE DAILY 180 tablet 1  . Magnesium 250 MG TABS Take 250 mg by mouth daily.     . Melatonin 10 MG TABS Take 10 mg by mouth at bedtime.    . metoCLOPramide (REGLAN) 10 MG tablet TAKE 1 TABLET BY MOUTH FOUR TIMES DAILY EVERY NIGHT BEFORE A MEAL AND AT BEDTIME FOR ACID REFLUX 360 tablet 0  . montelukast (SINGULAIR) 10 MG tablet TAKE 1 TABLET BY MOUTH EVERY DAY 90 tablet 3  . olmesartan-hydrochlorothiazide (BENICAR HCT) 40-12.5 MG tablet TAKE 1 TABLET BY MOUTH ONCE DAILY (Patient taking differently: Takes 1/2 tablet daily) 90 tablet 1  . ONETOUCH DELICA LANCETS 29B MISC Check blood sugar 1 time daily-DX-E11.22 100 each 4  . phentermine (ADIPEX-P) 37.5 MG tablet Take 1/2 to 1 tablet every morning for dieting & weight  loss 30 tablet 5  . Phenylephrine-Guaifenesin (MUCUS RELIEF PE PO) Take 1 tablet by mouth daily.    . Probiotic Product (PROBIOTIC DAILY PO) Take by mouth.    . ranitidine (ZANTAC) 150 MG tablet TAKE 1 TABLET(150 MG) BY MOUTH TWICE DAILY 180 tablet 0  . simvastatin (ZOCOR) 40 MG  tablet TAKE 1 TABLET BY MOUTH  EVERY EVENING 90 tablet 3  . venlafaxine XR (EFFEXOR-XR) 150 MG 24 hr capsule Take 150 mg by mouth 2 (two) times daily. Patient gets RX from  a different provider.    . zaleplon (SONATA) 10 MG capsule Take 10 mg by mouth at bedtime as needed for sleep.      No current facility-administered medications for this visit.      ALLERGIES: Penicillins; Sulfa antibiotics; Celebrex [celecoxib]; Ciprofloxacin; Codeine; Levaquin [levofloxacin]; Motrin ib [ibuprofen]; and Prempro [conj estrog-medroxyprogest ace]  Family History  Problem Relation Age of Onset  . Hypertension Father   . Diabetes Father   . Parkinson's disease Father   . Dementia Mother   . Heart attack Paternal Grandfather 19       Died MI    Social History   Socioeconomic History  . Marital status: Divorced    Spouse name: Not on file  . Number of children: 1  . Years of education: Not on file  . Highest education level: Not on file  Occupational History    Employer: RETIRED  Social Needs  . Financial resource strain: Not on file  . Food insecurity:    Worry: Not on file    Inability: Not on file  . Transportation needs:    Medical: Not on file    Non-medical: Not on file  Tobacco Use  . Smoking status: Current Every Day Smoker    Packs/day: 0.50    Years: 40.00    Pack years: 20.00    Types: Cigarettes  . Smokeless tobacco: Never Used  Substance and Sexual Activity  . Alcohol use: No  . Drug use: No  . Sexual activity: Not Currently    Partners: Male    Birth control/protection: Post-menopausal  Lifestyle  . Physical activity:    Days per week: Not on file    Minutes per session: Not on file  . Stress: Not on file  Relationships  . Social connections:    Talks on phone: Not on file    Gets together: Not on file    Attends religious service: Not on file    Active member of club or organization: Not on file    Attends meetings of clubs or organizations: Not on file     Relationship status: Not on file  . Intimate partner violence:    Fear of current or ex partner: Not on file    Emotionally abused: Not on file    Physically abused: Not on file    Forced sexual activity: Not on file  Other Topics Concern  . Not on file  Social History Narrative   Lives alone.      Review of Systems  Constitutional: Negative.   HENT: Negative.   Eyes: Negative.   Respiratory: Negative.   Cardiovascular: Negative.   Gastrointestinal: Positive for constipation.  Endocrine: Negative.   Genitourinary: Positive for frequency and urgency.       Loss of urine spontaneously  Loss of urine with cough or sneeze   Musculoskeletal:       Muscle weakness   Skin: Negative.   Allergic/Immunologic: Negative.   Neurological: Negative.   Hematological: Negative.   Psychiatric/Behavioral: Positive for dysphoric mood.    PHYSICAL EXAMINATION:    BP 134/82 (BP Location: Right Arm, Patient Position: Sitting, Cuff Size: Large)   Pulse 92   Resp 18   Ht 5\' 4"  (1.626 m)   Wt 262 lb 4 oz (119 kg)   LMP 04/09/2009   BMI 45.02 kg/m     General appearance: alert, cooperative and appears stated age Head: Normocephalic, without obvious abnormality, atraumatic Neck: no adenopathy, supple, symmetrical, trachea midline  and thyroid normal to inspection and palpation Lungs: clear to auscultation bilaterally Heart: regular rate and rhythm Abdomen: soft, non-tender, no masses,  no organomegaly Extremities: extremities normal, atraumatic, no cyanosis or edema Skin: Skin color, texture, turgor normal. No rashes or lesions No abnormal inguinal nodes palpated    Pelvic: External genitalia:  no lesions              Urethra:  normal appearing urethra with no masses, tenderness or lesions              Bartholins and Skenes: normal                 Vagina:  Mesh material visible and palpable under the midurethra.   No masses.              Cervix: no lesions                Bimanual Exam:   Uterus:  normal size, contour, position, consistency, mobility, non-tender              Adnexa: no mass, fullness, tenderness           Chaperone was present for exam.  ASSESSMENT  Hx midurethral sling.  Vaginal mesh exposure noted today.   Mixed incontinence.  Fibroid uterus.  I suspect these are not contributing to her urinary symptoms.  Lower right abdominal and right groin pain.  Hx back and knee surgery. A1C 6.4   PLAN  We discussed her mixed incontinence and her mesh exposure.  We discussed that vaginal mesh exposure may not be responsible for her symptoms.  I am recommending she see Dr. Matilde Sprang for further evaluation.  She may benefit from urodynamics with EMG and a cystoscopy. We talked about the various forms of treatment of overactive bladder.  I encouraged good blood sugar control. She will return for a pelvic ultrasound.   Questions invited and answered.    An After Visit Summary was printed and given to the patient.  __40____ minutes face to face time of which over 50% was spent in counseling.

## 2017-12-29 LAB — HM DIABETES EYE EXAM

## 2018-01-06 ENCOUNTER — Ambulatory Visit (INDEPENDENT_AMBULATORY_CARE_PROVIDER_SITE_OTHER): Payer: Medicare Other | Admitting: Obstetrics and Gynecology

## 2018-01-06 ENCOUNTER — Other Ambulatory Visit: Payer: Self-pay

## 2018-01-06 ENCOUNTER — Encounter: Payer: Self-pay | Admitting: Obstetrics and Gynecology

## 2018-01-06 ENCOUNTER — Ambulatory Visit (INDEPENDENT_AMBULATORY_CARE_PROVIDER_SITE_OTHER): Payer: Medicare Other

## 2018-01-06 VITALS — BP 126/80 | HR 88 | Ht 64.0 in | Wt 265.0 lb

## 2018-01-06 DIAGNOSIS — D219 Benign neoplasm of connective and other soft tissue, unspecified: Secondary | ICD-10-CM | POA: Diagnosis not present

## 2018-01-06 DIAGNOSIS — R1031 Right lower quadrant pain: Secondary | ICD-10-CM | POA: Diagnosis not present

## 2018-01-06 DIAGNOSIS — G8929 Other chronic pain: Secondary | ICD-10-CM

## 2018-01-06 DIAGNOSIS — D259 Leiomyoma of uterus, unspecified: Secondary | ICD-10-CM

## 2018-01-06 NOTE — Progress Notes (Signed)
GYNECOLOGY  VISIT   HPI: 67 y.o.   Divorced  Caucasian  female   [phone] with Patient's last menstrual period was 04/09/2009. here for pelvic ultrasound.   Has enlarged uterus on pelvic exam and right lower abdominal and right groin pain.  Also has lower left pelvic discomfort.  Pelvic tenderness is somewhat chronic.  Denies any vaginal bleeding.   Know fibroids.  Last [place] 12/20/14. Fibroids then were 6.76 cm, 1.37 cm, 1.94 cm, and 3.49 cm.    Colonoscopy scheduled in the next week or so.  GYNECOLOGIC HISTORY: Patient's last menstrual period was 04/09/2009. Contraception:  Postmenopausal Menopausal hormone therapy: none Last mammogram:  02-03-17 category a density birads 1:neg Last pap smear: 12-15-16 neg,   12-05-13 neg HPV HR neg        OB History    Gravida  3   Para  1   Term  1   Preterm      AB  2   Living  1     SAB      TAB  2   Ectopic      Multiple      Live Births                 Patient Active Problem List   Diagnosis Date Noted  . Osteopenia 11/09/2017  . Seasonal allergies 12/15/2016  . COPD (chronic obstructive pulmonary disease) ([place]) 12/20/2014  . Fibroid uterus 12/20/2014  . Obesities, morbid ([place]) 11/19/2014  . OSA (obstructive sleep apnea) 11/19/2014  . GERD  11/19/2014  . Hyperlipidemia 09/27/2013  . Medication management 09/27/2013  . 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 Vitamin D deficiency   . Iron deficiency anemia 06/17/2012  . Compulsive tobacco user syndrome 06/16/2012  . Tobacco abuse 06/16/2012  . Essential hypertension 06/02/2012  . Depression, major, in remission ([place]) 04/09/1988  . IBS (irritable bowel syndrome) 02/07/1988  . Bell's palsy 01/07/1985    Past Medical History:  Diagnosis Date  . Anemia   . Anxiety   . Bell's palsy 01/1985  . COPD (chronic obstructive pulmonary disease) ([place])   . Depression   . Diabetes mellitus without complication ([place])   . GERD (gastroesophageal reflux disease)   .  Hypertension   . IBS (irritable bowel syndrome) 12/89  . Obesity, Class III, BMI 40-49.9 (morbid obesity) ([place])   . OSA (obstructive sleep apnea)    no cpap  . Vitamin D deficiency     Past Surgical History:  Procedure Laterality Date  . BLADDER SUSPENSION  03/2006, 07/2006, 01/2007   for stress incontinence  . COLONOSCOPY  05/2005   Normal, recheck 10 yrs  . JOINT REPLACEMENT     x3 lt knee  . LUMBAR FUSION Right 09/2016   L4-5 fusion by Dr. Sherley Bounds  . TONSILLECTOMY AND ADENOIDECTOMY    . TOTAL KNEE ARTHROPLASTY Left 2012   x 2 left knee  . TOTAL KNEE REVISION  2014    Current Outpatient Medications  Medication Sig Dispense Refill  . acetaminophen (TYLENOL) 500 MG tablet Take 500 mg by mouth 2 (two) times daily.    Marland Kitchen albuterol (PROVENTIL HFA;VENTOLIN HFA) 108 (90 Base) MCG/ACT inhaler Inhale 2 puffs into the lungs every 6 (six) hours as needed for wheezing. 18 g 1  . ALPRAZolam (XANAX) 0.25 MG tablet Take 1/2 to 1 tablet 2 x / day if needed for anxiety. (Patient taking differently: Take 0.25 mg by mouth 2 (two) times  daily. ) 180 tablet 1  . aspirin 81 MG tablet Take 81 mg by mouth at bedtime.     . Biotin 5000 MCG CAPS Take by mouth.    . Calcium Carbonate-Vit D-Min (CALCIUM 1200 PO) Take by mouth.    . cephALEXin (KEFLEX) 500 MG capsule Take 4 capsules 2 hours prior to Dental Procedure 12 capsule 0  . cetirizine (ZYRTEC) 10 MG tablet Take 10 mg by mouth daily.    . Cholecalciferol (D3-1000) 1000 units capsule Take 1,000 Units by mouth daily.    Marland Kitchen CINNAMON PO Take 1,500 mg by mouth.    . cyclobenzaprine (FLEXERIL) 10 MG tablet TAKE 1/2 TO 1 TABLET BY  MOUTH 3 TIMES A DAY ONLY IF NEEDED FOR MUSCLE SPASM (Patient taking differently: Take 10mg s by mouth daily at night, may take an additional 10 mg daily as needed for muscle spasms) 270 tablet 3  . docusate sodium (COLACE) 100 MG capsule Take 100 mg by mouth daily as needed for mild constipation.    . fluticasone (FLONASE) 50  MCG/ACT nasal spray Place 2 sprays into the nose daily as needed for allergies.     Marland Kitchen glucose blood (ONETOUCH VERIO) test strip Check blood sugar 1 time daily-DX-E11.22. 100 each 4  . hyoscyamine (LEVBID) 0.375 MG 12 hr tablet TAKE 1 TABLET BY MOUTH TWICE DAILY 180 tablet 1  . Magnesium 250 MG TABS Take 250 mg by mouth daily.     . Melatonin 10 MG TABS Take 10 mg by mouth at bedtime.    . metoCLOPramide (REGLAN) 10 MG tablet TAKE 1 TABLET BY MOUTH FOUR TIMES DAILY EVERY NIGHT BEFORE A MEAL AND AT BEDTIME FOR ACID REFLUX 360 tablet 0  . montelukast (SINGULAIR) 10 MG tablet TAKE 1 TABLET BY MOUTH EVERY DAY 90 tablet 3  . olmesartan-hydrochlorothiazide (BENICAR HCT) 40-12.5 MG tablet TAKE 1 TABLET BY MOUTH ONCE DAILY (Patient taking differently: Takes 1/2 tablet daily) 90 tablet 1  . ONETOUCH DELICA LANCETS 06C MISC Check blood sugar 1 time daily-DX-E11.22 100 each 4  . phentermine (ADIPEX-P) 37.5 MG tablet Take 1/2 to 1 tablet every morning for dieting & weight  loss 30 tablet 5  . Phenylephrine-Guaifenesin (MUCUS RELIEF PE PO) Take 1 tablet by mouth daily.    . Probiotic Product (PROBIOTIC DAILY PO) Take by mouth.    . ranitidine (ZANTAC) 150 MG tablet TAKE 1 TABLET(150 MG) BY MOUTH TWICE DAILY 180 tablet 0  . simvastatin (ZOCOR) 40 MG tablet TAKE 1 TABLET BY MOUTH  EVERY EVENING 90 tablet 3  . venlafaxine XR (EFFEXOR-XR) 150 MG 24 hr capsule Take 150 mg by mouth 2 (two) times daily. Patient gets RX from a different provider.    . zaleplon (SONATA) 10 MG capsule Take 10 mg by mouth at bedtime as needed for sleep.      No current facility-administered medications for this visit.      ALLERGIES: Penicillins; Sulfa antibiotics; Celebrex [celecoxib]; Ciprofloxacin; Codeine; Levaquin [levofloxacin]; Motrin ib [ibuprofen]; and Prempro [conj estrog-medroxyprogest ace]  Family History  Problem Relation Age of Onset  . Hypertension Father   . Diabetes Father   . Parkinson's disease Father   .  Dementia Mother   . Heart attack Paternal Grandfather 41       Died MI    Social History   Socioeconomic History  . Marital status: Divorced    Spouse name: Not on file  . Number of children: 1  . Years of education: Not on  file  . Highest education level: Not on file  Occupational History    Employer: RETIRED  Social Needs  . Financial resource strain: Not on file  . Food insecurity:    Worry: Not on file    Inability: Not on file  . Transportation needs:    Medical: Not on file    Non-medical: Not on file  Tobacco Use  . Smoking status: Current Every Day Smoker    Packs/day: 0.50    Years: 40.00    Pack years: 20.00    Types: Cigarettes  . Smokeless tobacco: Never Used  Substance and Sexual Activity  . Alcohol use: No  . Drug use: No  . Sexual activity: Not Currently    Partners: Male    Birth control/protection: Post-menopausal  Lifestyle  . Physical activity:    Days per week: Not on file    Minutes per session: Not on file  . Stress: Not on file  Relationships  . Social connections:    Talks on phone: Not on file    Gets together: Not on file    Attends religious service: Not on file    Active member of club or organization: Not on file    Attends meetings of clubs or organizations: Not on file    Relationship status: Not on file  . Intimate partner violence:    Fear of current or ex partner: Not on file    Emotionally abused: Not on file    Physically abused: Not on file    Forced sexual activity: Not on file  Other Topics Concern  . Not on file  Social History Narrative   Lives alone.      Review of Systems  All other systems reviewed and are negative.   PHYSICAL EXAMINATION:    BP 126/80 (BP Location: Right Arm, Patient Position: Sitting, Cuff Size: Large)   Pulse 88   Ht 5\' 4"  (1.626 m)   Wt 265 lb (120.2 kg)   LMP 04/09/2009   BMI 45.49 kg/m     General appearance: alert, cooperative and appears stated age  Pelvic US today Fibroids  - 7.58 cm, a.11 cm. 2.51 cm, and 3.52 cm.  EMS 4.07 mm.  Ovaries normal.  No free fluid.  Chaperone was present for exam.  ASSESSMENT  Fibroids. No significant change from previous in 2016.  Pelvic pain.  Vaginal mesh exposure from midurethral sling.   PLAN  We discussed fibroids.  We also reviewed pelvic pain and the multiple etiologies including reproductive causes, GI, urologic and musculoskeletal causes.  She will do her colonoscopy next.  Will follow her fibroids clinically.  She will see urology, Dr. Matilde Sprang for her urologic evaluation.  Fu prn.    An After Visit Summary was printed and given to the patient.  _15_____ minutes face to face time of which over 50% was spent in counseling.

## 2018-01-06 NOTE — Progress Notes (Signed)
Encounter reviewed by Dr. Omeed Osuna Amundson C. Silva.  

## 2018-01-06 NOTE — Patient Instructions (Signed)
Uterine Fibroids Uterine fibroids are tissue masses (tumors) that can develop in the womb (uterus). They are also called leiomyomas. This type of tumor is not cancerous (benign) and does not spread to other parts of the body outside of the pelvic area, which is between the hip bones. Occasionally, fibroids may develop in the fallopian tubes, in the cervix, or on the support structures (ligaments) that surround the uterus. You can have one or many fibroids. Fibroids can vary in size, weight, and where they grow in the uterus. Some can become quite large. Most fibroids do not require medical treatment. What are the causes? A fibroid can develop when a single uterine cell keeps growing (replicating). Most cells in the human body have a control mechanism that keeps them from replicating without control. What are the signs or symptoms? Symptoms may include:  Heavy bleeding during your period.  Bleeding or spotting between periods.  Pelvic pain and pressure.  Bladder problems, such as needing to urinate more often (urinary frequency) or urgently.  Inability to reproduce offspring (infertility).  Miscarriages.  How is this diagnosed? Uterine fibroids are diagnosed through a physical exam. Your health care provider may feel the lumpy tumors during a pelvic exam. Ultrasonography and an MRI may be done to determine the size, location, and number of fibroids. How is this treated? Treatment may include:  Watchful waiting. This involves getting the fibroid checked by your health care provider to see if it grows or shrinks. Follow your health care provider's recommendations for how often to have this checked.  Hormone medicines. These can be taken by mouth or given through an intrauterine device (IUD).  Surgery. ? Removing the fibroids (myomectomy) or the uterus (hysterectomy). ? Removing blood supply to the fibroids (uterine artery embolization).  If fibroids interfere with your fertility and you  want to become pregnant, your health care provider may recommend having the fibroids removed. Follow these instructions at home:  Keep all follow-up visits as directed by your health care provider. This is important.  Take over-the-counter and prescription medicines only as told by your health care provider. ? If you were prescribed a hormone treatment, take the hormone medicines exactly as directed.  Ask your health care provider about taking iron pills and increasing the amount of dark green, leafy vegetables in your diet. These actions can help to boost your blood iron levels, which may be affected by heavy menstrual bleeding.  Pay close attention to your period and tell your health care provider about any changes, such as: ? Increased blood flow that requires you to use more pads or tampons than usual per month. ? A change in the number of days that your period lasts per month. ? A change in symptoms that are associated with your period, such as abdominal cramping or back pain. Contact a health care provider if:  You have pelvic pain, back pain, or abdominal cramps that cannot be controlled with medicines.  You have an increase in bleeding between and during periods.  You soak tampons or pads in a half hour or less.  You feel lightheaded, extra tired, or weak. Get help right away if:  You faint.  You have a sudden increase in pelvic pain. This information is not intended to replace advice given to you by your health care provider. Make sure you discuss any questions you have with your health care provider. Document Released: 02/21/2000 Document Revised: 10/24/2015 Document Reviewed: 08/22/2013 Elsevier Interactive Patient Education  2018 [employer]  

## 2018-01-17 ENCOUNTER — Other Ambulatory Visit: Payer: Self-pay | Admitting: Internal Medicine

## 2018-02-07 ENCOUNTER — Other Ambulatory Visit: Payer: Self-pay | Admitting: Internal Medicine

## 2018-02-10 LAB — HM MAMMOGRAPHY

## 2018-02-14 ENCOUNTER — Encounter: Payer: Self-pay | Admitting: *Deleted

## 2018-02-14 ENCOUNTER — Encounter: Payer: Self-pay | Admitting: Internal Medicine

## 2018-02-14 DIAGNOSIS — Z8249 Family history of ischemic heart disease and other diseases of the circulatory system: Secondary | ICD-10-CM | POA: Insufficient documentation

## 2018-02-14 NOTE — Progress Notes (Signed)
This very nice 67 y.o DWF presents for 6 month follow up with HTN, HLD, T2_NIDDM,  and Vitamin D Deficiency. Patient has GERD controlled on her meds. Patient has Chronic Pain Syndrome consequent of Lumbar DDD & is followed by Dr Nicholaus Bloom & has had EDSI by Dr Mina Marble.      Patient is treated for HTN (2007) & BP has been controlled at home. Today's BP is at goal -  110/72. Patient has had no complaints of any cardiac type chest pain, palpitations, dyspnea / orthopnea / PND, dizziness, claudication, or dependent edema.     Hyperlipidemia is controlled with diet & meds. Patient denies myalgias or other med SE's. Last Lipids were  Lab Results  Component Value Date   CHOL 181 11/11/2017   HDL 63 11/11/2017   LDLCALC 97 11/11/2017   TRIG 117 11/11/2017   CHOLHDL 2.9 11/11/2017      Also, the patient has history of  Morbid Obesity (BMI 43+) and T2_NIDDM (2008) and she has had no symptoms of reactive hypoglycemia, diabetic polys, paresthesias or visual blurring.  Last A1c was not at goal: Lab Results  Component Value Date   HGBA1C 6.4 (H) 11/11/2017      Further, the patient also has history of Vitamin D Deficiency  ("13" / 2008) and supplements vitamin D without any suspected side-effects. Last vitamin D was still low and not at  Goal (70-100): Lab Results  Component Value Date   VD25OH 49 11/11/2017   Current Outpatient Medications on File Prior to Visit  Medication Sig  . Cholecalciferol (VITAMIN D3 PO) Take 5,000 Units by mouth daily.  Marland Kitchen acetaminophen (TYLENOL) 500 MG tablet Take 500 mg by mouth 2 (two) times daily.  Marland Kitchen albuterol (PROVENTIL HFA;VENTOLIN HFA) 108 (90 Base) MCG/ACT inhaler Inhale 2 puffs into the lungs every 6 (six) hours as needed for wheezing.  Marland Kitchen ALPRAZolam (XANAX) 0.25 MG tablet Take 1/2 to 1 tablet 2 x / day if needed for anxiety. (Patient taking differently: Take 0.25 mg by mouth 2 (two) times daily. )  . aspirin 81 MG tablet Take 81 mg by mouth at bedtime.   .  Biotin 5000 MCG CAPS Take by mouth.  . Calcium Carbonate-Vit D-Min (CALCIUM 1200 PO) Take by mouth.  . cephALEXin (KEFLEX) 500 MG capsule Take 4 capsules 2 hours prior to Dental Procedure  . cetirizine (ZYRTEC) 10 MG tablet Take 10 mg by mouth daily.  Marland Kitchen CINNAMON PO Take 1,500 mg by mouth.  . cyclobenzaprine (FLEXERIL) 10 MG tablet TAKE 1/2 TO 1 TABLET BY  MOUTH 3 TIMES A DAY ONLY IF NEEDED FOR MUSCLE SPASM (Patient taking differently: Take 10mg s by mouth daily at night, may take an additional 10 mg daily as needed for muscle spasms)  . docusate sodium (COLACE) 100 MG capsule Take 100 mg by mouth daily as needed for mild constipation.  . fluticasone (FLONASE) 50 MCG/ACT nasal spray Place 2 sprays into the nose daily as needed for allergies.   . hyoscyamine (LEVBID) 0.375 MG 12 hr tablet TAKE 1 TABLET BY MOUTH TWICE DAILY  . Magnesium 250 MG TABS Take 250 mg by mouth daily.   . Melatonin 10 MG TABS Take 10 mg by mouth at bedtime.  . metoCLOPramide (REGLAN) 10 MG tablet TAKE 1 TABLET BY MOUTH FOUR TIMES DAILY EVERY NIGHT BEFORE A MEAL AND AT BEDTIME FOR ACID REFLUX  . montelukast (SINGULAIR) 10 MG tablet TAKE 1 TABLET BY MOUTH EVERY DAY  .  olmesartan-hydrochlorothiazide (BENICAR HCT) 40-12.5 MG tablet TAKE 1 TABLET BY MOUTH ONCE DAILY (Patient taking differently: Takes 1/2 tablet daily)  . ONETOUCH DELICA LANCETS 10F MISC Check blood sugar 1 time daily-[id number]  . ONETOUCH VERIO test strip USE TO CHECK BLOOD SUGAR ONCE DAILY  . phentermine (ADIPEX-P) 37.5 MG tablet Take 1/2 to 1 tablet every morning for dieting & weight  loss  . Phenylephrine-Guaifenesin (MUCUS RELIEF PE PO) Take 1 tablet by mouth daily.  . Probiotic Product (PROBIOTIC DAILY PO) Take by mouth.  . ranitidine (ZANTAC) 150 MG tablet TAKE 1 TABLET(150 MG) BY MOUTH TWICE DAILY  . simvastatin (ZOCOR) 40 MG tablet TAKE 1 TABLET BY MOUTH  EVERY EVENING  . venlafaxine XR (EFFEXOR-XR) 150 MG 24 hr capsule Take 150 mg by mouth 2 (two) times  daily. Patient gets RX from a different provider.  . zaleplon (SONATA) 10 MG capsule Take 10 mg by mouth at bedtime as needed for sleep.    No current facility-administered medications on file prior to visit.    Allergies  Allergen Reactions  . Penicillins Anaphylaxis and Rash    Childhood allergy   . Sulfa Antibiotics Shortness Of Breath    Also light headed, rash  . Celebrex [Celecoxib]     Elevates Blood Pressure   . Ciprofloxacin Nausea And Vomiting  . Codeine Nausea And Vomiting    And hydrocodone  . Levaquin [Levofloxacin] Nausea And Vomiting  . Motrin Ib [Ibuprofen] Hives  . Prempro [Conj Estrog-Medroxyprogest Ace] Hives   PMHx:   Past Medical History:  Diagnosis Date  . Anemia   . Anxiety   . Bell's palsy 01/1985  . COPD (chronic obstructive pulmonary disease) ([place])   . Depression   . Diabetes mellitus without complication ([place])   . GERD (gastroesophageal reflux disease)   . Hypertension   . IBS (irritable bowel syndrome) 12/89  . Obesity, Class III, BMI 40-49.9 (morbid obesity) ([place])   . OSA (obstructive sleep apnea)    no cpap  . Vitamin D deficiency    Immunization History  Administered Date(s) Administered  . DT 05/21/2015  . Influenza,inj,Quad PF,6+ Mos 12/02/2016  . Influenza,inj,quad, With Preservative 12/09/2015  . Influenza-Unspecified 12/07/2016  . PPD Test 03/21/2013, 05/21/2015  . Pneumococcal Conjugate-13 01/04/2014  . Pneumococcal Polysaccharide-23 03/09/2004, 11/11/2017  . Td 03/09/2004  . Tdap 03/10/2015  . Typhoid Inactivated 05/10/2014  . Zoster 12/22/2012   Past Surgical History:  Procedure Laterality Date  . BLADDER SUSPENSION  03/2006, 07/2006, 01/2007   for stress incontinence  . COLONOSCOPY  05/2005   Normal, recheck 10 yrs  . JOINT REPLACEMENT     x3 lt knee  . LUMBAR FUSION Right 09/2016   L4-5 fusion by Dr. Sherley Bounds  . TONSILLECTOMY AND ADENOIDECTOMY    . TOTAL KNEE ARTHROPLASTY Left 2012   x 2 left knee  . TOTAL KNEE  REVISION  2014   FHx:    Reviewed / unchanged  SHx:    Reviewed / unchanged   Systems Review:  Constitutional: Denies fever, chills, wt changes, headaches, insomnia, fatigue, night sweats, change in appetite. Eyes: Denies redness, blurred vision, diplopia, discharge, itchy, watery eyes.  ENT: Denies discharge, congestion, post nasal drip, epistaxis, sore throat, earache, hearing loss, dental pain, tinnitus, vertigo, sinus pain, snoring.  CV: Denies chest pain, palpitations, irregular heartbeat, syncope, dyspnea, diaphoresis, orthopnea, PND, claudication or edema. Respiratory: denies cough, dyspnea, DOE, pleurisy, hoarseness, laryngitis, wheezing.  Gastrointestinal: Denies dysphagia, odynophagia, heartburn, reflux, water brash, abdominal pain  or cramps, nausea, vomiting, bloating, diarrhea, constipation, hematemesis, melena, hematochezia  or hemorrhoids. Genitourinary: Denies dysuria, frequency, urgency, nocturia, hesitancy, discharge, hematuria or flank pain. Musculoskeletal: Denies arthralgias, myalgias, stiffness, jt. swelling, pain, limping or strain/sprain.  Skin: Denies pruritus, rash, hives, warts, acne, eczema or change in skin lesion(s). Neuro: No weakness, tremor, incoordination, spasms, paresthesia or pain. Psychiatric: Denies confusion, memory loss or sensory loss. Endo: Denies change in weight, skin or hair change.  Heme/Lymph: No excessive bleeding, bruising or enlarged lymph nodes.  Physical Exam  BP 110/72   Pulse 72   Temp (!) 97.3 F (36.3 C)   Resp 16   Ht 5\' 5"  (1.651 m)   Wt 261 lb 6.4 oz (118.6 kg)   LMP 04/09/2009   BMI 43.50 kg/m   Appears  well nourished, well groomed  and in no distress.  Eyes: PERRLA, EOMs, conjunctiva no swelling or erythema. Sinuses: No frontal/maxillary tenderness ENT/Mouth: EAC's clear, TM's nl w/o erythema, bulging. Nares clear w/o erythema, swelling, exudates. Oropharynx clear without erythema or exudates. Oral hygiene is good.  Tongue normal, non obstructing. Hearing intact.  Neck: Supple. Thyroid not palpable. Car 2+/2+ without bruits, nodes or JVD. Chest: Respirations nl with BS clear & equal w/o rales, rhonchi, wheezing or stridor.  Cor: Heart sounds normal w/ regular rate and rhythm without sig. murmurs, gallops, clicks or rubs. Peripheral pulses normal and equal  without edema.  Abdomen: Soft & bowel sounds normal. Non-tender w/o guarding, rebound, hernias, masses or organomegaly.  Lymphatics: Unremarkable.  Musculoskeletal: Full ROM all peripheral extremities, joint stability, 5/5 strength and normal gait.  Skin: Warm, dry without exposed rashes, lesions or ecchymosis apparent.  Neuro: Cranial nerves intact, reflexes equal bilaterally. Sensory-motor testing grossly intact. Tendon reflexes grossly intact.  Pysch: Alert & oriented x 3.  Insight and judgement nl & appropriate. No ideations.  Assessment and Plan:  1. Essential hypertension  - Continue medication, monitor blood pressure at home.  - Continue DASH diet.  Reminder to go to the ER if any CP,  SOB, nausea, dizziness, severe HA, changes vision/speech.  - CBC with Differential/Platelet - COMPLETE METABOLIC PANEL WITH GFR - Magnesium - TSH  2. Hyperlipidemia, mixed  - Continue diet/meds, exercise,& lifestyle modifications.  - Continue monitor periodic cholesterol/liver & renal functions   - Lipid panel - TSH  3. Controlled type 2 diabetes mellitus with stage 2 chronic kidney disease, without long-term current use of insulin (HCC)  - Continue diet, exercise,  - lifestyle modifications.  - Monitor appropriate labs.  - Hemoglobin A1c - Insulin, random  4. Vitamin D deficiency  - Continue supplementation.  - VITAMIN D 25 Hydroxyl  5. Gastroesophageal reflux disease  - CBC with Differential/Platelet - famotidine (PEPCID) 20 MG tablet; Take 1 to 2 tablets  /daily for Acid Indigestion & Reflux  Dispense: 180 tablet; Refill: 3  6.  Hypothyroidism  - TSH  7. Medication management  - CBC with Differential/Platelet - COMPLETE METABOLIC PANEL WITH GFR - Magnesium - Lipid panel - TSH - Hemoglobin A1c - Insulin, random - VITAMIN D 25 Hydroxyl       Discussed  regular exercise, BP monitoring, weight control to achieve/maintain BMI less than 25 and discussed med and SE's. Recommended labs to assess and monitor clinical status with further disposition pending results of labs. Over 30 minutes of exam, counseling, chart review was performed.

## 2018-02-14 NOTE — Patient Instructions (Signed)

## 2018-02-15 ENCOUNTER — Ambulatory Visit (INDEPENDENT_AMBULATORY_CARE_PROVIDER_SITE_OTHER): Payer: Medicare Other | Admitting: Internal Medicine

## 2018-02-15 VITALS — BP 110/72 | HR 72 | Temp 97.3°F | Resp 16 | Ht 65.0 in | Wt 261.4 lb

## 2018-02-15 DIAGNOSIS — E559 Vitamin D deficiency, unspecified: Secondary | ICD-10-CM

## 2018-02-15 DIAGNOSIS — I10 Essential (primary) hypertension: Secondary | ICD-10-CM

## 2018-02-15 DIAGNOSIS — K219 Gastro-esophageal reflux disease without esophagitis: Secondary | ICD-10-CM

## 2018-02-15 DIAGNOSIS — E1122 Type 2 diabetes mellitus with diabetic chronic kidney disease: Secondary | ICD-10-CM | POA: Diagnosis not present

## 2018-02-15 DIAGNOSIS — E782 Mixed hyperlipidemia: Secondary | ICD-10-CM

## 2018-02-15 DIAGNOSIS — N182 Chronic kidney disease, stage 2 (mild): Secondary | ICD-10-CM

## 2018-02-15 DIAGNOSIS — E039 Hypothyroidism, unspecified: Secondary | ICD-10-CM

## 2018-02-15 DIAGNOSIS — Z79899 Other long term (current) drug therapy: Secondary | ICD-10-CM

## 2018-02-15 MED ORDER — FAMOTIDINE 20 MG PO TABS: ORAL_TABLET | ORAL | 3 refills | Status: DC

## 2018-02-15 MED ORDER — MELOXICAM 15 MG PO TABS: ORAL_TABLET | ORAL | 1 refills | Status: DC

## 2018-02-16 LAB — CBC WITH DIFFERENTIAL/PLATELET
BASOS ABS: 56 {cells}/uL (ref 0–200)
BASOS PCT: 0.6 %
EOS ABS: 301 {cells}/uL (ref 15–500)
Eosinophils Relative: 3.2 %
HCT: 40 % (ref 35.0–45.0)
Hemoglobin: 13.4 g/dL (ref 11.7–15.5)
Lymphs Abs: 2002 cells/uL (ref 850–3900)
MCH: 29.5 pg (ref 27.0–33.0)
MCHC: 33.5 g/dL (ref 32.0–36.0)
MCV: 87.9 fL (ref 80.0–100.0)
MPV: 10.3 fL (ref 7.5–12.5)
Monocytes Relative: 6.8 %
NEUTROS PCT: 68.1 %
Neutro Abs: 6401 cells/uL (ref 1500–7800)
PLATELETS: 234 10*3/uL (ref 140–400)
RBC: 4.55 10*6/uL (ref 3.80–5.10)
RDW: 13.4 % (ref 11.0–15.0)
TOTAL LYMPHOCYTE: 21.3 %
WBC: 9.4 10*3/uL (ref 3.8–10.8)
WBCMIX: 639 {cells}/uL (ref 200–950)

## 2018-02-16 LAB — HEMOGLOBIN A1C
EAG (MMOL/L): 7.3 (calc)
Hgb A1c MFr Bld: 6.2 % of total Hgb — ABNORMAL HIGH (ref <5.7)
Mean Plasma Glucose: 131 (calc)

## 2018-02-16 LAB — COMPLETE METABOLIC PANEL WITH GFR
AG Ratio: 1.3 (calc) (ref 1.0–2.5)
ALT: 23 U/L (ref 6–29)
AST: 32 U/L (ref 10–35)
Albumin: 4.1 g/dL (ref 3.6–5.1)
Alkaline phosphatase (APISO): 180 U/L — ABNORMAL HIGH (ref 33–130)
BILIRUBIN TOTAL: 0.4 mg/dL (ref 0.2–1.2)
BUN: 16 mg/dL (ref 7–25)
CHLORIDE: 100 mmol/L (ref 98–110)
CO2: 30 mmol/L (ref 20–32)
Calcium: 9.6 mg/dL (ref 8.6–10.4)
Creat: 0.75 mg/dL (ref 0.50–0.99)
GFR, EST AFRICAN AMERICAN: 97 mL/min/{1.73_m2} (ref >=60)
GFR, EST NON AFRICAN AMERICAN: 84 mL/min/{1.73_m2} (ref >=60)
Globulin: 3.1 g/dL (calc) (ref 1.9–3.7)
Glucose, Bld: 76 mg/dL (ref 65–99)
Potassium: 4.6 mmol/L (ref 3.5–5.3)
Sodium: 140 mmol/L (ref 135–146)
Total Protein: 7.2 g/dL (ref 6.1–8.1)

## 2018-02-16 LAB — LIPID PANEL
Cholesterol: 176 mg/dL (ref <200)
HDL: 58 mg/dL (ref >50)
LDL Cholesterol (Calc): 95 mg/dL (calc)
Non-HDL Cholesterol (Calc): 118 mg/dL (calc) (ref <130)
Total CHOL/HDL Ratio: 3 (calc) (ref <5.0)
Triglycerides: 126 mg/dL (ref <150)

## 2018-02-16 LAB — INSULIN, RANDOM: Insulin: 7.6 u[IU]/mL (ref 2.0–19.6)

## 2018-02-16 LAB — MAGNESIUM: Magnesium: 2.2 mg/dL (ref 1.5–2.5)

## 2018-02-16 LAB — TSH: TSH: 3.55 mIU/L (ref 0.40–4.50)

## 2018-02-16 LAB — VITAMIN D 25 HYDROXY (VIT D DEFICIENCY, FRACTURES): Vit D, 25-Hydroxy: 59 ng/mL (ref 30–100)

## 2018-02-20 ENCOUNTER — Encounter: Payer: Self-pay | Admitting: Internal Medicine

## 2018-03-09 ENCOUNTER — Other Ambulatory Visit: Payer: Self-pay | Admitting: Physician Assistant

## 2018-03-09 DIAGNOSIS — K219 Gastro-esophageal reflux disease without esophagitis: Secondary | ICD-10-CM

## 2018-04-13 ENCOUNTER — Other Ambulatory Visit: Payer: Self-pay | Admitting: Internal Medicine

## 2018-04-13 DIAGNOSIS — Z6841 Body Mass Index (BMI) 40.0 and over, adult: Principal | ICD-10-CM

## 2018-04-13 MED ORDER — PHENTERMINE HCL 37.5 MG PO TABS: ORAL_TABLET | ORAL | 5 refills | Status: DC

## 2018-04-29 ENCOUNTER — Other Ambulatory Visit: Payer: Self-pay | Admitting: Internal Medicine

## 2018-04-29 MED ORDER — CYCLOBENZAPRINE HCL 10 MG PO TABS: ORAL_TABLET | ORAL | 1 refills | Status: DC

## 2018-05-02 ENCOUNTER — Other Ambulatory Visit: Payer: Self-pay | Admitting: Internal Medicine

## 2018-05-02 MED ORDER — CYCLOBENZAPRINE HCL 10 MG PO TABS: ORAL_TABLET | ORAL | 3 refills | Status: DC

## 2018-05-04 ENCOUNTER — Other Ambulatory Visit: Payer: Self-pay | Admitting: Internal Medicine

## 2018-05-10 ENCOUNTER — Other Ambulatory Visit: Payer: Self-pay | Admitting: Internal Medicine

## 2018-05-17 ENCOUNTER — Other Ambulatory Visit: Payer: Self-pay | Admitting: Internal Medicine

## 2018-05-17 MED ORDER — TIZANIDINE HCL 4 MG PO TABS: ORAL_TABLET | ORAL | 0 refills | Status: DC

## 2018-05-26 NOTE — Progress Notes (Deleted)
MEDICARE ANNUAL WELLNESS VISIT AND FOLLOW UP [phone]  Assessment:   Medicare wellness visit 1 year  Essential hypertension Continue medications Monitor blood pressure at home; call if consistently over 130/80 Continue DASH diet.   Reminder to go to the ER if any CP, SOB, nausea, dizziness, severe HA, changes vision/speech, left arm numbness and tingling and jaw pain.  OSA (obstructive sleep apnea) Years ago, couldn't tolerate mask   Chronic obstructive pulmonary disease, unspecified COPD type ([place]) Advised to stop smoking, CXR, continue meds.   Irritable bowel syndrome, unspecified type Avoid triggers, bowel management discussed  Gastroesophageal reflux disease, esophagitis presence not specified Well managed on current medications Discussed diet, avoiding triggers and other lifestyle changes  Controlled type 2 diabetes mellitus with chronic kidney disease, without long-term current use of insulin, unspecified CKD stage [facility]) Education: Reviewed 'ABCs' of diabetes management (respective goals in parentheses):  A1C (<7), blood pressure (<130/80), and cholesterol (LDL <70) Eye Exam yearly and Dental Exam every 6 months. Dietary recommendations Physical Activity recommendations  Bell's palsy Remote, no recent inssues  Intramural leiomyoma of uterus Followed by GYN annually   Vitamin D deficiency Continue supplementation Check vitamin D level  Tobacco abuse Cutting back, declines medication  Seasonal allergies Continue OTC allergy pills  Obesities, morbid ([place]) Long discussion about weight loss, diet, and exercise Recommended diet heavy in fruits and veggies and low in animal meats, cheeses, and dairy products, appropriate calorie intake Discussed appropriate weight for height  Patient on phentermine with  no SE, reminded to take drug breaks; continue close follow up. Suggested 15 min minimum exercise daily such as cycling, track calories/macros via Lose it! Or  MyfitnessPAL app Follow up at next visit  Medication management CBC, CMP/GFR, magnesium  Iron deficiency anemia, unspecified iron deficiency anemia type CBC  Mixed hyperlipidemia Continue medications Continue low cholesterol diet and exercise.  Check lipid panel.   Depression, major, in remission ([place]) Continue medications  Lifestyle discussed: diet/exerise, sleep hygiene, stress management, hydration  Compulsive tobacco user syndrome Currently cutting back, declines medication  Osteopenia of multiple sites UTD on dexa, get in 2 years, continue vit D, emphasized weight bearing exercise, stop smoking   Over 40 minutes of exam, counseling, chart review and critical decision making was performed Future Appointments  Date Time Provider [place]  05/30/2018  2:30 PM Vicie Mutters, PA-C [facility] None  09/05/2018 10:00 AM [name], MD [facility] None  11/23/2018  2:00 PM Liane Comber, NP [facility] None  12/20/2018  2:00 PM Regina Eck, CNM [place] None     Plan:   During the course of the visit the patient was educated and counseled about appropriate screening and preventive services including:    Pneumococcal vaccine   Prevnar 13  Influenza vaccine  Td vaccine  Screening electrocardiogram  Bone densitometry screening  Colorectal cancer screening  Diabetes screening  Glaucoma screening  Nutrition counseling   Advanced directives: requested   Subjective:  Beth [name] is a 68 y.o. female who presents for Medicare Annual Wellness Visit and 3 month follow up.   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  She's also been seen by Dr Lynann Bologna and patient has received  EDSI's x 3  by Dr Mina Marble. Last Surgery was L4-5 Fusion by Dr Sherley Bounds in July 2018.   She currently continues to smoke 1/2 pack a day; discussed risks associated with smoking, patient is  ready  to quit, and is cutting back gradually. Has declined medications thus far.    She has hx of major depression in remission on effexor; she is also prescribed xanax 0.25 mg BID PRN breakthrough anxiety,  and takes sonata at night for sleep.   She is prescribed phentermine for weight loss.  While on the medication they have lost 0 lbs since last visit. They deny palpitations, anxiety, trouble sleeping, elevated BP.  BMI is There is no height or weight on file to calculate BMI., she has not been working on exercise due to back surgery, she did do extended PT, thinks r/t discrepancy in leg length, has lifts.  Wt Readings from Last 3 Encounters:  02/15/18 261 lb 6.4 oz (118.6 kg)  01/06/18 265 lb (120.2 kg)  12/27/17 262 lb 4 oz (119 kg)    Her blood pressure has been controlled at home, today their BP is   She does not workout. She denies chest pain, shortness of breath, dizziness.   She is on cholesterol medication (simvastatin 40 mg daily) and denies myalgias. Her cholesterol is at goal. The cholesterol last visit was:   Lab Results  Component Value Date   CHOL 176 02/15/2018   HDL 58 02/15/2018   [place] 95 02/15/2018   TRIG 126 02/15/2018   CHOLHDL 3.0 02/15/2018    She has not been working on diet and exercise for T2DM currently treated by lifestyle modification, and denies foot ulcerations, increased appetite, nausea, polydipsia, polyuria, visual disturbances and vomiting. Last A1C in the office was:  Lab Results  Component Value Date   HGBA1C 6.2 (H) 02/15/2018   Last GFR: Lab Results  Component Value Date   GFRNONAA 84 02/15/2018   Patient is on Vitamin D supplement.   Lab Results  Component Value Date   VD25OH 59 02/15/2018      Medication Review:   Current Outpatient Medications (Cardiovascular):  .  olmesartan-hydrochlorothiazide (BENICAR HCT) 40-12.5 MG tablet, TAKE 1 TABLET BY MOUTH ONCE DAILY (Patient taking differently: Takes 1/2 tablet daily) .   simvastatin (ZOCOR) 40 MG tablet, TAKE 1 TABLET BY MOUTH  EVERY EVENING  Current Outpatient Medications (Respiratory):  .  albuterol (PROVENTIL HFA;VENTOLIN HFA) 108 (90 Base) MCG/ACT inhaler, Inhale 2 puffs into the lungs every 6 (six) hours as needed for wheezing. .  cetirizine (ZYRTEC) 10 MG tablet, Take 10 mg by mouth daily. .  fluticasone (FLONASE) 50 MCG/ACT nasal spray, Place 2 sprays into the nose daily as needed for allergies.  .  montelukast (SINGULAIR) 10 MG tablet, TAKE 1 TABLET BY MOUTH EVERY DAY .  Phenylephrine-Guaifenesin (MUCUS RELIEF PE PO), Take 1 tablet by mouth daily.  Current Outpatient Medications (Analgesics):  .  acetaminophen (TYLENOL) 500 MG tablet, Take 500 mg by mouth 2 (two) times daily. Marland Kitchen  aspirin 81 MG tablet, Take 81 mg by mouth at bedtime.  .  meloxicam (MOBIC) 15 MG tablet, Take 1/2 to 1 tablet /daily with food and try to limit to 1/2 or alternate days as need for pain & inflammation   Current Outpatient Medications (Other):  Marland Kitchen  ALPRAZolam (XANAX) 0.25 MG tablet, Take 1/2 to 1 tablet 2 x / day if needed for anxiety. (Patient taking differently: Take 0.25 mg by mouth 2 (two) times daily. ) .  ALPRAZolam (XANAX) 0.25 MG tablet, Take 1/2 to 1 tablet 1 or 2 x / day ONLY if needed for Anxiety Attack & please try to limit to 5 days /week to avoid addiction .  Biotin 5000 MCG CAPS, Take by mouth. .  Calcium Carbonate-Vit D-Min (CALCIUM 1200 PO), Take by mouth. .  cephALEXin (KEFLEX) 500 MG capsule, Take 4 capsules 2 hours prior to Dental Procedure .  Cholecalciferol (VITAMIN D3 PO), Take 5,000 Units by mouth daily. Marland Kitchen  CINNAMON PO, Take 1,500 mg by mouth. .  cyclobenzaprine (FLEXERIL) 10 MG tablet, Take 1/2 to 1 tablet  2 to 3 x /day as needed for Muscle Spasm .  docusate sodium (COLACE) 100 MG capsule, Take 100 mg by mouth daily as needed for mild constipation. .  famotidine (PEPCID) 20 MG tablet, Take 1 to 2 tablets  /daily for Acid Indigestion & Reflux .   hyoscyamine (LEVBID) 0.375 MG 12 hr tablet, TAKE 1 TABLET BY MOUTH TWICE DAILY .  Magnesium 250 MG TABS, Take 250 mg by mouth daily.  .  Melatonin 10 MG TABS, Take 10 mg by mouth at bedtime. .  metoCLOPramide (REGLAN) 10 MG tablet, TAKE 1 TABLET BY MOUTH FOUR TIMES DAILY EVERY NIGHT BEFORE A MEAL AND AT BEDTIME FOR ACID REFLUX .  ONETOUCH DELICA LANCETS 29N MISC, Check blood sugar 1 time daily-DX-E11.22 .  ONETOUCH VERIO test strip, USE TO CHECK BLOOD SUGAR ONCE DAILY .  phentermine (ADIPEX-P) 37.5 MG tablet, Take 1/2 to 1 tablet every morning for dieting & weight  loss .  Probiotic Product (PROBIOTIC DAILY PO), Take by mouth. .  ranitidine (ZANTAC) 150 MG tablet, TAKE 1 TABLET BY MOUTH TWICE DAILY .  tiZANidine (ZANAFLEX) 4 MG tablet, Take 1/4 to 1/2 to 1 tablet 2 to 3 x /day - lowest dose - ONLY as needed for muscle spasm .  venlafaxine XR (EFFEXOR-XR) 150 MG 24 hr capsule, Take 150 mg by mouth 2 (two) times daily. Patient gets RX from a different provider. .  zaleplon (SONATA) 10 MG capsule, Take 10 mg by mouth at bedtime as needed for sleep.    Allergies  Allergen Reactions  . Penicillins Anaphylaxis and Rash    Childhood allergy   . Sulfa Antibiotics Shortness Of Breath    Also light headed, rash  . Celebrex [Celecoxib]     Elevates Blood Pressure   . Ciprofloxacin Nausea And Vomiting  . Codeine Nausea And Vomiting    And hydrocodone  . Levaquin [Levofloxacin] Nausea And Vomiting  . Motrin Ib [Ibuprofen] Hives  . Prempro [Conj Estrog-Medroxyprogest Ace] Hives    Current Problems (verified) Patient Active Problem List   Diagnosis Date Noted  . FHx: heart disease 02/14/2018  . Osteopenia 11/09/2017  . Seasonal allergies 12/15/2016  . COPD (chronic obstructive pulmonary disease) ([place]) 12/20/2014  . Fibroid uterus 12/20/2014  . Obesities, morbid ([place]) 11/19/2014  . OSA (obstructive sleep apnea) 11/19/2014  . GERD  11/19/2014  . Hyperlipidemia 09/27/2013  . Medication  management 09/27/2013  . 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 Vitamin D deficiency   . Iron deficiency anemia 06/17/2012  . Compulsive tobacco user syndrome 06/16/2012  . Smoker 06/16/2012  . Essential hypertension 06/02/2012  . Depression, major, in remission ([place]) 04/09/1988  . IBS (irritable bowel syndrome) 02/07/1988  . Bell's palsy 01/07/1985    Screening Tests Immunization History  Administered Date(s) Administered  . DT 05/21/2015  . Influenza,inj,Quad PF,6+ Mos 12/02/2016  . Influenza,inj,quad, With Preservative 12/09/2015  . Influenza-Unspecified 12/07/2016  . PPD Test 03/21/2013, 05/21/2015  . Pneumococcal Conjugate-13 01/04/2014  . Pneumococcal Polysaccharide-23 03/09/2004, 11/11/2017  . Td 03/09/2004  . Tdap 03/10/2015  . Typhoid  Inactivated 05/10/2014  . Zoster 12/22/2012    Preventative care: Last colonoscopy: 10/12/2012 - rec 5 year follow up ? Last mammogram: 01/2017 [facility] Last pap smear/pelvic exam: 12/2016 with GYN, goes annually  DEXA: 01/2017 - osteopenia  Prior vaccinations: TD or Tdap: 2017  Influenza: 2018 Pneumococcal: 2006 Prevnar13: 2015 Shingles/Zostavax: 2014  Names of Other Physician/Practitioners you currently use: 1. [facility] here for primary care 2. Dr. Delman Cheadle, eye doctor, last visit 2019 3. Dr. Otila Kluver, dentist, last visit 2019  Patient Care Team: [name], MD as PCP - General (Internal Medicine)  SURGICAL HISTORY She  has a past surgical history that includes Colonoscopy (05/2005); Total knee arthroplasty (Left, 2012); Bladder suspension (03/2006, 07/2006, 01/2007); Total knee revision (2014); Tonsillectomy and adenoidectomy; Joint replacement; and Lumbar fusion (Right, 09/2016). FAMILY HISTORY Her family history includes Dementia in her mother; Diabetes in her father; Heart attack (age of onset: 61) in her paternal grandfather; Hypertension in her father; Parkinson's disease in her  father. SOCIAL HISTORY She  reports that she has been smoking cigarettes. She has a 20.00 pack-year smoking history. She has never used smokeless tobacco. She reports that she does not drink alcohol or use drugs.   MEDICARE WELLNESS OBJECTIVES: Physical activity:   Cardiac risk factors:   Depression/mood screen:   Depression screen [facility] 2/9 02/20/2018  Decreased Interest 0  Down, Depressed, Hopeless 0  PHQ - 2 Score 0    ADLs:  In your present state of health, do you have any difficulty performing the following activities: 02/20/2018 11/11/2017  Hearing? N N  Vision? N N  Difficulty concentrating or making decisions? N N  Walking or climbing stairs? N Y  Comment - imbalance ? r/t unequal leg length, walks with cane  Dressing or bathing? N N  Doing errands, shopping? N N  Some recent data might be hidden     Cognitive Testing  Alert? Yes  Normal Appearance?Yes  Oriented to person? Yes  Place? Yes   Time? Yes  Recall of three objects?  Yes  Can perform simple calculations? Yes  Displays appropriate judgment?Yes  Can read the correct time from a watch face?Yes  EOL planning:    Review of Systems  Constitutional: Negative for malaise/fatigue and weight loss.  HENT: Negative for hearing loss and tinnitus.   Eyes: Negative for blurred vision and double vision.  Respiratory: Negative for cough, sputum production, shortness of breath and wheezing.   Cardiovascular: Negative for chest pain, palpitations, orthopnea, claudication, leg swelling and PND.  Gastrointestinal: Negative for abdominal pain, blood in stool, constipation, diarrhea, heartburn, melena, nausea and vomiting.  Genitourinary: Positive for frequency and urgency. Negative for dysuria, flank pain and hematuria.  Musculoskeletal: Positive for falls (unsteady gait due to unequal leg lenghts, has done PT). Negative for joint pain and myalgias.  Skin: Negative for rash.  Neurological: Negative for dizziness, tingling,  sensory change, weakness and headaches.  Endo/Heme/Allergies: Negative for polydipsia.  Psychiatric/Behavioral: Negative.  Negative for depression, memory loss, substance abuse and suicidal ideas. The patient is not nervous/anxious and does not have insomnia.   All other systems reviewed and are negative.    Objective:     There were no vitals filed for this visit. There is no height or weight on file to calculate BMI.  General appearance: alert, no distress, WD/WN, female HEENT: normocephalic, sclerae anicteric, TMs pearly, nares patent, no discharge or erythema, pharynx normal Oral cavity: MMM, no lesions Neck: supple, no lymphadenopathy, no  thyromegaly, no masses Heart: RRR, normal S1, S2, no murmurs Lungs: CTA bilaterally, no wheezes, rhonchi, or rales Abdomen: +bs, soft, obese abdomen, non tender, non distended, no masses, no hepatomegaly, no splenomegaly Musculoskeletal: nontender, no swelling, no obvious deformity Extremities: no edema, no cyanosis, no clubbing Pulses: 2+ symmetric, upper and lower extremities, normal cap refill Neurological: alert, oriented x 3, CN2-12 intact, strength normal upper extremities and lower extremities, sensation normal throughout, DTRs 2+ throughout, no cerebellar signs, gait slow with cane Psychiatric: normal affect, behavior normal, pleasant   Medicare Attestation I have personally reviewed: The patient's medical and social history Their use of alcohol, tobacco or illicit drugs Their current medications and supplements The patient's functional ability including ADLs,fall risks, home safety risks, cognitive, and hearing and visual impairment Diet and physical activities Evidence for depression or mood disorders  The patient's weight, height, BMI, and visual acuity have been recorded in the chart.  I have made referrals, counseling, and provided education to the patient based on review of the above and I have provided the patient with a written  personalized care plan for preventive services.     Vicie Mutters, PA-C   05/26/2018

## 2018-05-30 ENCOUNTER — Ambulatory Visit: Payer: Self-pay | Admitting: Physician Assistant

## 2018-06-07 ENCOUNTER — Other Ambulatory Visit: Payer: Self-pay | Admitting: Internal Medicine

## 2018-06-17 ENCOUNTER — Other Ambulatory Visit: Payer: Medicare Other | Admitting: Internal Medicine

## 2018-06-17 DIAGNOSIS — N182 Chronic kidney disease, stage 2 (mild): Secondary | ICD-10-CM | POA: Diagnosis not present

## 2018-06-17 DIAGNOSIS — E1122 Type 2 diabetes mellitus with diabetic chronic kidney disease: Secondary | ICD-10-CM

## 2018-06-17 MED ORDER — METFORMIN HCL ER 500 MG PO TB24: ORAL_TABLET | ORAL | 3 refills | Status: DC

## 2018-06-17 NOTE — Progress Notes (Addendum)
THIS ENCOUNTER IS A VIRTUAL VISIT DUE TO COVID-19 - PATIENT WAS NOT SEEN IN THE OFFICE.  PATIENT HAS CONSENTED TO VIRTUAL VISIT / TELEMEDICINE VISIT  Virtual Visit via telephone Note  I connected with Beth [name] on 06/17/2018 06/17/2018  by telephone.  I verified that I am speaking with the correct person using two identifiers.    I discussed the limitations of evaluation and management by telemedicine and the availability of in person appointments. The patient expressed understanding and agreed to proceed.  History of Present Illness:     The patient has history of Morbid Obesity (BMI 43+) and T2_NIDDM circa 2008 calling today reporting her CBG's recently have been running around 200's mg%.  Patient had been treated in the past with Metformin until tapering off about 1 year ago. Having some blurred vision, but no poly's or paraesthesias.  Medications  .  olmesartan-hydrochlorothiazide (BENICAR HCT) 40-12.5 MG tablet, TAKE 1 TABLET BY MOUTH ONCE DAILY (Patient taking differently: Takes 1/2 tablet daily) .  simvastatin (ZOCOR) 40 MG tablet, TAKE 1 TABLET BY MOUTH  EVERY EVENING .  albuterol (PROVENTIL HFA;VENTOLIN HFA) 108 (90 Base) MCG/ACT inhaler, Inhale 2 puffs into the lungs every 6 (six) hours as needed for wheezing. .  cetirizine (ZYRTEC) 10 MG tablet, Take 10 mg by mouth daily. .  fluticasone (FLONASE) 50 MCG/ACT nasal spray, Place 2 sprays into the nose daily as needed for allergies.  .  montelukast (SINGULAIR) 10 MG tablet, TAKE 1 TABLET BY MOUTH EVERY DAY .  Phenylephrine-Guaifenesin (MUCUS RELIEF PE PO), Take 1 tablet by mouth daily. Marland Kitchen  acetaminophen (TYLENOL) 500 MG tablet, Take 500 mg by mouth 2 (two) times daily. Marland Kitchen  aspirin 81 MG tablet, Take 81 mg by mouth at bedtime.  .  meloxicam (MOBIC) 15 MG tablet, Take 1/2 to 1 tablet /daily with food and try to limit to 1/2 or alternate days as need for pain & inflammation .  ALPRAZolam (XANAX) 0.25 MG tablet, Take 1/2 to 1 tablet 2  x / day if needed for anxiety. (Patient taking differently: Take 0.25 mg by mouth 2 (two) times daily. ) .  ALPRAZolam (XANAX) 0.25 MG tablet, Take 1/2 to 1 tablet 1 or 2 x / day ONLY if needed for Anxiety Attack & please try to limit to 5 days /week to avoid addiction .  Biotin 5000 MCG CAPS, Take by mouth. .  Calcium Carbonate-Vit D-Min (CALCIUM 1200 PO), Take by mouth. .  cephALEXin (KEFLEX) 500 MG capsule, Take 4 capsules 2 hours prior to Dental Procedure .  Cholecalciferol (VITAMIN D3 PO), Take 5,000 Units by mouth daily. Marland Kitchen  CINNAMON PO, Take 1,500 mg by mouth. .  cyclobenzaprine (FLEXERIL) 10 MG tablet, Take 1/2 to 1 tablet  2 to 3 x /day as needed for Muscle Spasm .  docusate sodium (COLACE) 100 MG capsule, Take 100 mg by mouth daily as needed for mild constipation. .  famotidine (PEPCID) 20 MG tablet, Take 1 to 2 tablets  /daily for Acid Indigestion & Reflux .  hyoscyamine (LEVBID) 0.375 MG 12 hr tablet, TAKE 1 TABLET BY MOUTH TWICE DAILY .  Magnesium 250 MG TABS, Take 250 mg by mouth daily.  .  Melatonin 10 MG TABS, Take 10 mg by mouth at bedtime. .  metoCLOPramide (REGLAN) 10 MG tablet, TAKE 1 TABLET BY MOUTH FOUR TIMES DAILY EVERY NIGHT BEFORE A MEAL AND AT BEDTIME FOR ACID REFLUX .  ONETOUCH DELICA LANCETS 41U MISC, Check blood sugar 1  time daily-DX-E11.22 .  ONETOUCH VERIO test strip, USE TO CHECK BLOOD SUGAR ONCE DAILY .  phentermine (ADIPEX-P) 37.5 MG tablet, Take 1/2 to 1 tablet every morning for dieting & weight  loss .  Probiotic Product (PROBIOTIC DAILY PO), Take by mouth. .  ranitidine (ZANTAC) 150 MG tablet, TAKE 1 TABLET BY MOUTH TWICE DAILY .  tiZANidine (ZANAFLEX) 4 MG tablet, Take 1/4 to 1/2 to 1 tablet 2 to 3 x /day - lowest dose - ONLY as needed for muscle spasm .  venlafaxine XR (EFFEXOR-XR) 150 MG 24 hr capsule, Take 150 mg by mouth 2 (two) times daily. Patient gets RX from a different provider. .  zaleplon (SONATA) 10 MG capsule, Take 10 mg by mouth at bedtime as  needed for sleep.   Problem list She has Essential hypertension; T2_NIDDM w/CKD 2 (GFR 86 ml/min)  ([place]); Depression, major, in remission ([place]); IBS (irritable bowel syndrome); Bell's palsy; Vitamin D deficiency; Hyperlipidemia; Medication management; Obesity hypoventilation syndrome ([place]); OSA and COPD overlap syndrome ([place]); GERD ; COPD (chronic obstructive pulmonary disease) ([place]); Fibroid uterus; Iron deficiency anemia; Seasonal allergies; Osteopenia; and FHx: heart disease on their problem list.   Observations/Objective: General : Well sounding patient in no apparent distress HEENT: no hoarseness, no cough for duration of visit Lungs: speaks in complete sentences, no audible wheezing, no apparent distress Neurological: alert, oriented x 3 Psychiatric: pleasant, judgement appropriate   Assessment and Plan:  1. Controlled type 2 diabetes mellitus with stage 2 chronic kidney disease, without long-term current use of insulin (HCC)  - metFORMIN (GLUCOPHAGE-XR) 500 MG 24 hr tablet; Take 2 tablets 2 x /day with meals for Diabetes  Dispense: 360 tablet; Refill: 3  Follow Up Instructions:    Long discussion re: prudent diet & weight loss and encouraged to restart Cinnamon 1,000 mg caps 2 x/day.     Scheduled for 10 day f/u OV & AVW   I discussed the assessment and treatment plan with the patient. The patient was provided an opportunity to ask questions and all were answered. The patient agreed with the plan and demonstrated an understanding of the instructions.   The patient was advised to call back or seek an in-person evaluation if the symptoms worsen or if the condition fails to improve as anticipated.  I provided 28 minutes of non-face-to-face time during this encounter.  Kirtland Bouchard, MD

## 2018-06-22 ENCOUNTER — Other Ambulatory Visit: Payer: Medicare Other

## 2018-06-22 ENCOUNTER — Other Ambulatory Visit: Payer: Self-pay

## 2018-06-22 DIAGNOSIS — E1122 Type 2 diabetes mellitus with diabetic chronic kidney disease: Secondary | ICD-10-CM

## 2018-06-22 DIAGNOSIS — Z79899 Other long term (current) drug therapy: Secondary | ICD-10-CM

## 2018-06-22 DIAGNOSIS — E782 Mixed hyperlipidemia: Secondary | ICD-10-CM

## 2018-06-22 DIAGNOSIS — E559 Vitamin D deficiency, unspecified: Secondary | ICD-10-CM

## 2018-06-22 DIAGNOSIS — D509 Iron deficiency anemia, unspecified: Secondary | ICD-10-CM

## 2018-06-22 DIAGNOSIS — I10 Essential (primary) hypertension: Secondary | ICD-10-CM

## 2018-06-22 DIAGNOSIS — F325 Major depressive disorder, single episode, in full remission: Secondary | ICD-10-CM

## 2018-06-23 LAB — COMPLETE METABOLIC PANEL WITH GFR
AG Ratio: 1.3 (calc) (ref 1.0–2.5)
ALT: 23 U/L (ref 6–29)
AST: 22 U/L (ref 10–35)
Albumin: 4 g/dL (ref 3.6–5.1)
Alkaline phosphatase (APISO): 193 U/L — ABNORMAL HIGH (ref 37–153)
BUN/Creatinine Ratio: 19 (calc) (ref 6–22)
BUN: 20 mg/dL (ref 7–25)
CO2: 30 mmol/L (ref 20–32)
Calcium: 9.5 mg/dL (ref 8.6–10.4)
Chloride: 100 mmol/L (ref 98–110)
Creat: 1.04 mg/dL — ABNORMAL HIGH (ref 0.50–0.99)
GFR, Est African American: 65 mL/min/{1.73_m2} (ref >=60)
GFR, Est Non African American: 56 mL/min/{1.73_m2} — ABNORMAL LOW (ref >=60)
Globulin: 3 g/dL (calc) (ref 1.9–3.7)
Glucose, Bld: 96 mg/dL (ref 65–99)
Potassium: 4.5 mmol/L (ref 3.5–5.3)
Sodium: 138 mmol/L (ref 135–146)
Total Bilirubin: 0.3 mg/dL (ref 0.2–1.2)
Total Protein: 7 g/dL (ref 6.1–8.1)

## 2018-06-23 LAB — LIPID PANEL
Cholesterol: 171 mg/dL (ref <200)
HDL: 62 mg/dL (ref >=50)
LDL Cholesterol (Calc): 87 mg/dL (calc)
Non-HDL Cholesterol (Calc): 109 mg/dL (calc) (ref <130)
Total CHOL/HDL Ratio: 2.8 (calc) (ref <5.0)
Triglycerides: 123 mg/dL (ref <150)

## 2018-06-23 LAB — MAGNESIUM: Magnesium: 2.2 mg/dL (ref 1.5–2.5)

## 2018-06-23 LAB — VITAMIN D 25 HYDROXY (VIT D DEFICIENCY, FRACTURES): Vit D, 25-Hydroxy: 54 ng/mL (ref 30–100)

## 2018-06-23 LAB — TSH: TSH: 2.9 mIU/L (ref 0.40–4.50)

## 2018-06-23 LAB — CBC WITH DIFFERENTIAL/PLATELET
Absolute Monocytes: 593 cells/uL (ref 200–950)
Basophils Absolute: 71 cells/uL (ref 0–200)
Basophils Relative: 0.9 %
Eosinophils Absolute: 245 cells/uL (ref 15–500)
Eosinophils Relative: 3.1 %
HCT: 39.4 % (ref 35.0–45.0)
Hemoglobin: 13 g/dL (ref 11.7–15.5)
Lymphs Abs: 2022 cells/uL (ref 850–3900)
MCH: 28.3 pg (ref 27.0–33.0)
MCHC: 33 g/dL (ref 32.0–36.0)
MCV: 85.8 fL (ref 80.0–100.0)
MPV: 10.4 fL (ref 7.5–12.5)
Monocytes Relative: 7.5 %
Neutro Abs: 4969 cells/uL (ref 1500–7800)
Neutrophils Relative %: 62.9 %
Platelets: 241 10*3/uL (ref 140–400)
RBC: 4.59 10*6/uL (ref 3.80–5.10)
RDW: 13.7 % (ref 11.0–15.0)
Total Lymphocyte: 25.6 %
WBC: 7.9 10*3/uL (ref 3.8–10.8)

## 2018-06-23 LAB — HEMOGLOBIN A1C
Hgb A1c MFr Bld: 6.7 % of total Hgb — ABNORMAL HIGH (ref <5.7)
Mean Plasma Glucose: 146 (calc)
eAG (mmol/L): 8.1 (calc)

## 2018-06-27 DIAGNOSIS — E785 Hyperlipidemia, unspecified: Secondary | ICD-10-CM

## 2018-06-27 DIAGNOSIS — E1169 Type 2 diabetes mellitus with other specified complication: Secondary | ICD-10-CM | POA: Insufficient documentation

## 2018-06-27 NOTE — Progress Notes (Signed)
MEDICARE ANNUAL WELLNESS VISIT AND FOLLOW UP [phone]  THIS ENCOUNTER IS A VIRTUAL/TELEPHONE VISIT DUE TO COVID-19 - PATIENT WAS NOT SEEN IN THE OFFICE.  PATIENT HAS CONSENTED TO VIRTUAL VISIT / TELEMEDICINE VISIT  This provider placed a call to Beth [name] using telephone, her appointment was changed to a virtual office visit to reduce the risk of exposure to the COVID-19 virus and to help Beth [name] remain healthy and safe. The virtual visit will also provide continuity of care. She verbalizes understanding.    Assessment:   Medicare wellness visit 1 year  Essential hypertension Continue medications Monitor blood pressure at home; call if consistently over 130/80 Continue DASH diet.   Reminder to go to the ER if any CP, SOB, nausea, dizziness, severe HA, changes vision/speech, left arm numbness and tingling and jaw pain.  OSA (obstructive sleep apnea) Years ago, couldn't tolerate mask   Chronic obstructive pulmonary disease, unspecified COPD type ([place]) Advised to stop smoking, CXR, continue meds.   Irritable bowel syndrome, unspecified type Avoid triggers, bowel management discussed  Gastroesophageal reflux disease, esophagitis presence not specified Well managed on current medications Discussed diet, avoiding triggers and other lifestyle changes  Controlled type 2 diabetes mellitus with chronic kidney disease, without long-term current use of insulin, unspecified CKD stage [facility]) Education: Reviewed 'ABCs' of diabetes management (respective goals in parentheses):  A1C (<7), blood pressure (<130/80), and cholesterol (LDL <70) Eye Exam yearly and Dental Exam every 6 months. Dietary recommendations Physical Activity recommendations  Bell's palsy Remote, no recent inssues  Intramural leiomyoma of uterus Followed by GYN annually   Vitamin D deficiency Continue supplementation  Tobacco abuse Cutting back, declines medication  Seasonal allergies Continue OTC  allergy pills  Obesities, morbid ([place]) Long discussion about weight loss, diet, and exercise Recommended diet heavy in fruits and veggies and low in animal meats, cheeses, and dairy products, appropriate calorie intake Discussed appropriate weight for height  Follow up at next visit  Iron deficiency anemia, unspecified iron deficiency anemia type monitor  Mixed hyperlipidemia Continue medications Continue low cholesterol diet and exercise.  Check lipid panel.  NOT AT GOAL WILL SWITCH TO CRESTOR 10 MG STOP THE SIMVASTATIN  Depression, major, in remission ([place]) Continue medications  Lifestyle discussed: diet/exerise, sleep hygiene, stress management, hydration  Compulsive tobacco user syndrome Currently cutting back, declines medication  Osteopenia of multiple sites  continue vit D, emphasized weight bearing exercise, stop smoking   Over 40 minutes of exam, counseling, chart review and critical decision making was performed Future Appointments  Date Time Provider [employer]  09/05/2018 10:00 AM [name], MD [facility] None  11/23/2018  2:00 PM Liane Comber, NP [facility] None  12/20/2018  2:00 PM Regina Eck, CNM [place] None     Plan:   During the course of the visit the patient was educated and counseled about appropriate screening and preventive services including:    Pneumococcal vaccine   Prevnar 13  Influenza vaccine  Td vaccine  Screening electrocardiogram  Bone densitometry screening  Colorectal cancer screening  Diabetes screening  Glaucoma screening  Nutrition counseling   Advanced directives: requested   Subjective:  Beth [name] is a 68 y.o. female who presents for Medicare Annual Wellness Visit and 3 month follow up.   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  She's also been seen by Dr Lynann Bologna and patient has received  EDSI's x 3  by Dr Mina Marble. Last Surgery was L4-5 Fusion by Dr Sherley Bounds in July 2018. She is using a walker due left knee pain, suppose to be scheduled for July. Wanting to wait for colonoscopy with Dr. Collene Mares after surgery.   She currently continues to smoke 1/2 pack a day; discussed risks associated with smoking, patient is ready to quit, and is cutting back gradually. Has declined medications thus far.    She has hx of major depression in remission on effexor; she is also prescribed xanax 0.25 mg BID PRN breakthrough anxiety,  and takes sonata at night for sleep.   BMI is Body mass index is 43.5 kg/m., she has not been working on exercise due to back surgery, she did do extended PT, thinks r/t discrepancy in leg length, has lifts.  Wt Readings from Last 3 Encounters:  02/15/18 261 lb 6.4 oz (118.6 kg)  01/06/18 265 lb (120.2 kg)  12/27/17 262 lb 4 oz (119 kg)    Her blood pressure has been controlled at home, today their BP is BP: 127/89 She does not workout. She denies chest pain, shortness of breath, dizziness.   She is on cholesterol medication (simvastatin 40 mg daily) and denies myalgias. Her cholesterol is at goal. The cholesterol last visit was:   Lab Results  Component Value Date   CHOL 171 06/22/2018   HDL 62 06/22/2018   LDLCALC 87 06/22/2018   TRIG 123 06/22/2018   CHOLHDL 2.8 06/22/2018    She has not been working on diet and exercise for T2DM currently treated by lifestyle modification and metformin just started back on it x 2 weeks, and denies foot ulcerations, increased appetite, nausea, polydipsia, polyuria and vomiting. She started to have some blurred vision, she has started to check her sugars, 2 hours after eating was 96.  Last A1C in the office was:  Lab Results  Component Value Date   HGBA1C 6.7 (H) 06/22/2018   Last GFR: Lab Results  Component Value Date   GFRNONAA 56 (L) 06/22/2018   Patient is on Vitamin D supplement.   Lab Results  Component Value Date   VD25OH 54  06/22/2018      Medication Review:  Current Outpatient Medications (Endocrine & Metabolic):  .  metFORMIN (GLUCOPHAGE-XR) 500 MG 24 hr tablet, Take 2 tablets 2 x /day with meals for Diabetes  Current Outpatient Medications (Cardiovascular):  .  olmesartan-hydrochlorothiazide (BENICAR HCT) 40-12.5 MG tablet, TAKE 1 TABLET BY MOUTH ONCE DAILY (Patient taking differently: Takes 1/2 tablet daily) .  rosuvastatin (CRESTOR) 10 MG tablet, Take 1 tablet (10 mg total) by mouth at bedtime.  Current Outpatient Medications (Respiratory):  .  albuterol (PROVENTIL HFA;VENTOLIN HFA) 108 (90 Base) MCG/ACT inhaler, Inhale 2 puffs into the lungs every 6 (six) hours as needed for wheezing. .  cetirizine (ZYRTEC) 10 MG tablet, Take 10 mg by mouth daily. .  fluticasone (FLONASE) 50 MCG/ACT nasal spray, Place 2 sprays into the nose daily as needed for allergies.  .  montelukast (SINGULAIR) 10 MG tablet, TAKE 1 TABLET BY MOUTH EVERY DAY .  Phenylephrine-Guaifenesin (MUCUS RELIEF PE PO), Take 1 tablet by mouth daily.  Current Outpatient Medications (Analgesics):  .  acetaminophen (TYLENOL) 500 MG tablet, Take 500 mg by mouth 2 (two) times daily. Marland Kitchen  aspirin 81 MG tablet, Take 81 mg by mouth at bedtime.    Current Outpatient Medications (Other):  Marland Kitchen  ALPRAZolam (XANAX) 0.25 MG tablet, Take 1/2 to 1 tablet 2 x / day if  needed for anxiety. (Patient taking differently: Take 0.25 mg by mouth 2 (two) times daily. ) .  ALPRAZolam (XANAX) 0.25 MG tablet, Take 1/2 to 1 tablet 1 or 2 x / day ONLY if needed for Anxiety Attack & please try to limit to 5 days /week to avoid addiction .  Biotin 5000 MCG CAPS, Take by mouth. .  Calcium Carbonate-Vit D-Min (CALCIUM 1200 PO), Take by mouth. .  cephALEXin (KEFLEX) 500 MG capsule, Take 4 capsules 2 hours prior to Dental Procedure .  Cholecalciferol (VITAMIN D3 PO), Take 5,000 Units by mouth daily. Marland Kitchen  CINNAMON PO, Take 1,500 mg by mouth. .  docusate sodium (COLACE) 100 MG  capsule, Take 100 mg by mouth daily as needed for mild constipation. .  famotidine (PEPCID) 20 MG tablet, Take 1 to 2 tablets  /daily for Acid Indigestion & Reflux .  hyoscyamine (LEVBID) 0.375 MG 12 hr tablet, TAKE 1 TABLET BY MOUTH TWICE DAILY .  Magnesium 250 MG TABS, Take 250 mg by mouth daily.  .  Melatonin 10 MG TABS, Take 10 mg by mouth at bedtime. .  metoCLOPramide (REGLAN) 10 MG tablet, TAKE 1 TABLET BY MOUTH FOUR TIMES DAILY EVERY NIGHT BEFORE A MEAL AND AT BEDTIME FOR ACID REFLUX .  ONETOUCH DELICA LANCETS 99B MISC, Check blood sugar 1 time daily-DX-E11.22 .  ONETOUCH VERIO test strip, USE TO CHECK BLOOD SUGAR ONCE DAILY .  Probiotic Product (PROBIOTIC DAILY PO), Take by mouth. Marland Kitchen  tiZANidine (ZANAFLEX) 4 MG tablet, Take 1/4 to 1/2 to 1 tablet 2 to 3 x /day - lowest dose - ONLY as needed for muscle spasm .  venlafaxine XR (EFFEXOR-XR) 150 MG 24 hr capsule, Take 150 mg by mouth 2 (two) times daily. Patient gets RX from a different provider. .  zaleplon (SONATA) 10 MG capsule, Take 10 mg by mouth at bedtime as needed for sleep.    Allergies  Allergen Reactions  . Penicillins Anaphylaxis and Rash    Childhood allergy   . Sulfa Antibiotics Shortness Of Breath    Also light headed, rash  . Celebrex [Celecoxib]     Elevates Blood Pressure   . Ciprofloxacin Nausea And Vomiting  . Codeine Nausea And Vomiting    And hydrocodone  . Levaquin [Levofloxacin] Nausea And Vomiting  . Motrin Ib [Ibuprofen] Hives  . Prempro [Conj Estrog-Medroxyprogest Ace] Hives    Current Problems (verified) Patient Active Problem List   Diagnosis Date Noted  . Type 2 diabetes mellitus with hyperlipidemia ([place]) 06/27/2018  . FHx: heart disease 02/14/2018  . Osteopenia 11/09/2017  . Seasonal allergies 12/15/2016  . COPD (chronic obstructive pulmonary disease) ([place]) 12/20/2014  . Fibroid uterus 12/20/2014  . Obesity hypoventilation syndrome ([place]) 11/19/2014  . OSA and COPD overlap syndrome ([place])  11/19/2014  . GERD  11/19/2014  . Hyperlipidemia 09/27/2013  . Medication management 09/27/2013  . 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 Vitamin D deficiency   . Iron deficiency anemia 06/17/2012  . Essential hypertension 06/02/2012  . Depression, major, in remission ([place]) 04/09/1988  . IBS (irritable bowel syndrome) 02/07/1988  . Bell's palsy 01/07/1985    Screening Tests Immunization History  Administered Date(s) Administered  . DT 05/21/2015  . Influenza,inj,Quad PF,6+ Mos 12/02/2016  . Influenza,inj,quad, With Preservative 12/09/2015  . Influenza-Unspecified 12/07/2016  . PPD Test 03/21/2013, 05/21/2015  . Pneumococcal Conjugate-13 01/04/2014  . Pneumococcal Polysaccharide-23 03/09/2004, 11/11/2017  . Td 03/09/2004  . Tdap 03/10/2015  . Typhoid Inactivated  05/10/2014  . Zoster 12/22/2012    Preventative care: Last colonoscopy: 10/12/2012 - OVERDUE Last mammogram: 02/2018 [facility] Last pap smear/pelvic exam: 12/2016 with GYN DEXA: 01/2017 - osteopenia  Prior vaccinations: TD or Tdap: 2017  Influenza: 2018 Pneumococcal: 2019 Prevnar13: 2015 Shingles/Zostavax: 2014  Names of Other Physician/Practitioners you currently use: 1. [facility] here for primary care 2. Dr. Delman Cheadle, eye doctor, last visit 12/2017 3. Dr. Otila Kluver, dentist, last visit 2019  Patient Care Team: [name], MD as PCP - General (Internal Medicine)  SURGICAL HISTORY She  has a past surgical history that includes Colonoscopy (05/2005); Total knee arthroplasty (Left, 2012); Bladder suspension (03/2006, 07/2006, 01/2007); Total knee revision (2014); Tonsillectomy and adenoidectomy; Joint replacement; and Lumbar fusion (Right, 09/2016). FAMILY HISTORY Her family history includes Dementia in her mother; Diabetes in her father; Heart attack (age of onset: 9) in her paternal grandfather; Hypertension in her father; Parkinson's disease in her father. SOCIAL  HISTORY She  reports that she has been smoking cigarettes. She has a 20.00 pack-year smoking history. She has never used smokeless tobacco. She reports that she does not drink alcohol or use drugs.   MEDICARE WELLNESS OBJECTIVES: Physical activity: Current Exercise Habits: The patient does not participate in regular exercise at present, Exercise limited by: orthopedic condition(s) Cardiac risk factors: Cardiac Risk Factors include: advanced age (>49men, >59 women);diabetes mellitus;dyslipidemia;hypertension;obesity (BMI >30kg/m2);sedentary lifestyle;smoking/ tobacco exposure Depression/mood screen:   Depression screen [facility] 2/9 06/29/2018  Decreased Interest 0  Down, Depressed, Hopeless 0  PHQ - 2 Score 0    ADLs:  In your present state of health, do you have any difficulty performing the following activities: 06/29/2018 02/20/2018  Hearing? N N  Vision? Y N  Difficulty concentrating or making decisions? N N  Walking or climbing stairs? Y N  Comment - -  Dressing or bathing? N N  Doing errands, shopping? N N  Some recent data might be hidden     Cognitive Testing  Alert? Yes  Normal Appearance?Yes  Oriented to person? Yes  Place? Yes   Time? Yes  Recall of three objects?  Yes  Can perform simple calculations? Yes  Displays appropriate judgment?Yes  Can read the correct time from a watch face?Yes  EOL planning: Does Patient Have a Medical Advance Directive?: Yes Type of Advance Directive: Healthcare Power of Attorney, Living will [place] in Chart?: No - copy requested  Review of Systems  Constitutional: Negative for malaise/fatigue and weight loss.  HENT: Negative for hearing loss and tinnitus.   Eyes: Negative for blurred vision and double vision.  Respiratory: Negative for cough, sputum production, shortness of breath and wheezing.   Cardiovascular: Negative for chest pain, palpitations, orthopnea, claudication, leg swelling and PND.   Gastrointestinal: Negative for abdominal pain, blood in stool, constipation, diarrhea, heartburn, melena, nausea and vomiting.  Genitourinary: Positive for frequency and urgency. Negative for dysuria, flank pain and hematuria.  Musculoskeletal: Positive for falls (unsteady gait due to unequal leg lenghts, has done PT). Negative for joint pain and myalgias.  Skin: Negative for rash.  Neurological: Negative for dizziness, tingling, sensory change, weakness and headaches.  Endo/Heme/Allergies: Negative for polydipsia.  Psychiatric/Behavioral: Negative.  Negative for depression, memory loss, substance abuse and suicidal ideas. The patient is not nervous/anxious and does not have insomnia.   All other systems reviewed and are negative.    Objective:     Today's Vitals   06/29/18 1350  BP: 127/89  Height:  5\' 5"  (1.651 m)   Body mass index is 43.5 kg/m.  General Appearance:Well sounding, in no apparent distress.  ENT/Mouth: No hoarseness, No cough for duration of visit.  Respiratory: completing full sentences without distress, without audible wheeze Neuro: Awake and oriented X 3,  Psych:  Insight and Judgment appropriate.   Medicare Attestation I have personally reviewed: The patient's medical and social history Their use of alcohol, tobacco or illicit drugs Their current medications and supplements The patient's functional ability including ADLs,fall risks, home safety risks, cognitive, and hearing and visual impairment Diet and physical activities Evidence for depression or mood disorders  The patient's weight, height, BMI, and visual acuity have been recorded in the chart.  I have made referrals, counseling, and provided education to the patient based on review of the above and I have provided the patient with a written personalized care plan for preventive services.     Vicie Mutters, PA-C   06/29/2018

## 2018-06-29 ENCOUNTER — Encounter: Payer: Self-pay | Admitting: Physician Assistant

## 2018-06-29 ENCOUNTER — Other Ambulatory Visit: Payer: Self-pay

## 2018-06-29 ENCOUNTER — Ambulatory Visit: Payer: Medicare Other | Admitting: Physician Assistant

## 2018-06-29 VITALS — BP 127/89 | Ht 65.0 in

## 2018-06-29 DIAGNOSIS — E785 Hyperlipidemia, unspecified: Secondary | ICD-10-CM

## 2018-06-29 DIAGNOSIS — J302 Other seasonal allergic rhinitis: Secondary | ICD-10-CM

## 2018-06-29 DIAGNOSIS — G4733 Obstructive sleep apnea (adult) (pediatric): Secondary | ICD-10-CM

## 2018-06-29 DIAGNOSIS — Z0001 Encounter for general adult medical examination with abnormal findings: Secondary | ICD-10-CM | POA: Diagnosis not present

## 2018-06-29 DIAGNOSIS — I10 Essential (primary) hypertension: Secondary | ICD-10-CM

## 2018-06-29 DIAGNOSIS — G510 Bell's palsy: Secondary | ICD-10-CM

## 2018-06-29 DIAGNOSIS — K589 Irritable bowel syndrome without diarrhea: Secondary | ICD-10-CM

## 2018-06-29 DIAGNOSIS — E559 Vitamin D deficiency, unspecified: Secondary | ICD-10-CM

## 2018-06-29 DIAGNOSIS — R6889 Other general symptoms and signs: Secondary | ICD-10-CM | POA: Diagnosis not present

## 2018-06-29 DIAGNOSIS — K219 Gastro-esophageal reflux disease without esophagitis: Secondary | ICD-10-CM

## 2018-06-29 DIAGNOSIS — D509 Iron deficiency anemia, unspecified: Secondary | ICD-10-CM

## 2018-06-29 DIAGNOSIS — F325 Major depressive disorder, single episode, in full remission: Secondary | ICD-10-CM

## 2018-06-29 DIAGNOSIS — D251 Intramural leiomyoma of uterus: Secondary | ICD-10-CM

## 2018-06-29 DIAGNOSIS — E1122 Type 2 diabetes mellitus with diabetic chronic kidney disease: Secondary | ICD-10-CM

## 2018-06-29 DIAGNOSIS — M8589 Other specified disorders of bone density and structure, multiple sites: Secondary | ICD-10-CM

## 2018-06-29 DIAGNOSIS — Z79899 Other long term (current) drug therapy: Secondary | ICD-10-CM

## 2018-06-29 DIAGNOSIS — J449 Chronic obstructive pulmonary disease, unspecified: Secondary | ICD-10-CM

## 2018-06-29 DIAGNOSIS — E662 Morbid (severe) obesity with alveolar hypoventilation: Secondary | ICD-10-CM

## 2018-06-29 DIAGNOSIS — E1169 Type 2 diabetes mellitus with other specified complication: Secondary | ICD-10-CM

## 2018-06-29 DIAGNOSIS — E782 Mixed hyperlipidemia: Secondary | ICD-10-CM

## 2018-06-29 DIAGNOSIS — Z0000 Encounter for general adult medical examination without abnormal findings: Secondary | ICD-10-CM

## 2018-06-29 MED ORDER — ROSUVASTATIN CALCIUM 10 MG PO TABS: 10.0000 mg | ORAL_TABLET | Freq: Every day | ORAL | 11 refills | Status: DC

## 2018-07-07 ENCOUNTER — Other Ambulatory Visit: Payer: Self-pay | Admitting: Internal Medicine

## 2018-07-07 MED ORDER — TIZANIDINE HCL 4 MG PO TABS: ORAL_TABLET | ORAL | 0 refills | Status: DC

## 2018-07-19 ENCOUNTER — Other Ambulatory Visit: Payer: Self-pay | Admitting: Internal Medicine

## 2018-07-19 MED ORDER — ALPRAZOLAM 0.25 MG PO TABS: ORAL_TABLET | ORAL | 0 refills | Status: AC

## 2018-07-28 ENCOUNTER — Other Ambulatory Visit: Payer: Self-pay | Admitting: Internal Medicine

## 2018-07-28 MED ORDER — MELOXICAM 15 MG PO TABS: ORAL_TABLET | ORAL | 0 refills | Status: DC

## 2018-07-30 ENCOUNTER — Other Ambulatory Visit: Payer: Self-pay | Admitting: Internal Medicine

## 2018-07-30 MED ORDER — MELOXICAM 15 MG PO TABS: ORAL_TABLET | ORAL | 0 refills | Status: DC

## 2018-08-08 HISTORY — PX: TOTAL KNEE REVISION: SHX996

## 2018-09-05 ENCOUNTER — Encounter: Payer: Self-pay | Admitting: Internal Medicine

## 2018-09-13 ENCOUNTER — Telehealth: Payer: Self-pay | Admitting: *Deleted

## 2018-09-13 ENCOUNTER — Telehealth: Payer: Self-pay

## 2018-09-13 NOTE — Telephone Encounter (Signed)
-----   Message from Vicie Mutters, [place] sent at 09/13/2018  8:24 AM EDT ----- Regarding: RE: meds Contact: [phone] Would suggest telephone or office visit to go over medications and symptoms.  Estill Bamberg ----- Message ----- From: Elenor Quinones, CMA Sent: 09/12/2018   5:06 PM EDT To: Vicie Mutters, PA-C Subject: meds                                           Jim---physical therapist [facility]   Due to CRONIC constipation.  Patient has stopped taking several meds. Including blood pressure & diabetes.  Per Jim---P.T Patient would like a return call on what meds to take.

## 2018-09-13 NOTE — Telephone Encounter (Signed)
Order faxed to [facility].

## 2018-09-13 NOTE — Telephone Encounter (Signed)
Message sent to front to schedule a phone visit for med follow up

## 2018-09-16 ENCOUNTER — Other Ambulatory Visit: Payer: Self-pay | Admitting: Adult Health Nurse Practitioner

## 2018-09-16 ENCOUNTER — Ambulatory Visit: Payer: Medicare Other | Admitting: Adult Health Nurse Practitioner

## 2018-09-16 ENCOUNTER — Encounter: Payer: Self-pay | Admitting: Adult Health Nurse Practitioner

## 2018-09-16 ENCOUNTER — Other Ambulatory Visit: Payer: Self-pay

## 2018-09-16 VITALS — BP 130/90 | HR 78 | Temp 98.1°F

## 2018-09-16 DIAGNOSIS — K5903 Drug induced constipation: Secondary | ICD-10-CM

## 2018-09-16 DIAGNOSIS — Z79899 Other long term (current) drug therapy: Secondary | ICD-10-CM

## 2018-09-16 DIAGNOSIS — J449 Chronic obstructive pulmonary disease, unspecified: Secondary | ICD-10-CM

## 2018-09-16 DIAGNOSIS — K589 Irritable bowel syndrome without diarrhea: Secondary | ICD-10-CM | POA: Diagnosis not present

## 2018-09-16 DIAGNOSIS — Z96652 Presence of left artificial knee joint: Secondary | ICD-10-CM | POA: Diagnosis not present

## 2018-09-16 DIAGNOSIS — I10 Essential (primary) hypertension: Secondary | ICD-10-CM

## 2018-09-16 DIAGNOSIS — E1122 Type 2 diabetes mellitus with diabetic chronic kidney disease: Secondary | ICD-10-CM

## 2018-09-16 DIAGNOSIS — K219 Gastro-esophageal reflux disease without esophagitis: Secondary | ICD-10-CM

## 2018-09-16 DIAGNOSIS — E559 Vitamin D deficiency, unspecified: Secondary | ICD-10-CM

## 2018-09-16 MED ORDER — METOCLOPRAMIDE HCL 10 MG PO TABS: ORAL_TABLET | ORAL | 1 refills | Status: DC

## 2018-09-16 MED ORDER — ALBUTEROL SULFATE HFA 108 (90 BASE) MCG/ACT IN AERS: 2.0000 | INHALATION_SPRAY | Freq: Four times a day (QID) | RESPIRATORY_TRACT | 1 refills | Status: DC | PRN

## 2018-09-16 MED ORDER — HYOSCYAMINE SULFATE ER 0.375 MG PO TB12: 0.3750 mg | ORAL_TABLET | Freq: Two times a day (BID) | ORAL | 0 refills | Status: DC

## 2018-09-16 NOTE — Progress Notes (Signed)
Virtual Visit via Telephone Note  I connected with Beola Cord on 09/16/18 at 11:15 AM EDT by telephone and verified that I am speaking with the correct person using two identifiers.   I discussed the limitations, risks, security and privacy concerns of performing an evaluation and management service by telephone and the availability of in person appointments. I also discussed with the patient that there may be a patient responsible charge related to this service. The patient expressed understanding and agreed to proceed.   Assessment and Plan:  Diagnoses and all orders for this visit:  S/P revision of total knee, left Doing well Continue HH PT Continue Tramadol & acetamenophem PRN, especially prior to PT. Has follow up with Ortho  Medication management Discussed all medications.  Changes made making some medications PRN at this time. -     hyoscyamine (LEVBID) 0.375 MG 12 hr tablet; Take 1 tablet (0.375 mg total) by mouth 2 (two) times daily. As needed. -     metoCLOPramide (REGLAN) 10 MG tablet; TAKE 1 TABLET BY MOUTH FOUR TIMES DAILY EVERY NIGHT BEFORE A MEAL AND AT BEDTIME FOR ACID REFLUX as needed. D/C antihistamines at this time, asymptomatic.  Spending more time indoors related to knee replacement and high temperatures, monitor symptoms for necessity.  Take Mucus PE PRN, not daily, discussed use of medication, monitor for symptoms.  Irritable bowel syndrome, unspecified type Denies any cramping or bloating, taking for GERD? Patient unsure. Change to as needed. -     hyoscyamine (LEVBID) 0.375 MG 12 hr tablet; Take 1 tablet (0.375 mg total) by mouth 2 (two) times daily. As needed before meals.  Essential hypertension Was hypotensive in hospital Continue to monitor blood pressure Changed from Olmesartan 40mg /HCTZ12.5 to Olmesartan 40mg .  Has not restarted as of yet. Has not been taking, 130/90 today pulse 78 Discussed 130/90 or higher, take half tablet 140/90 or higher  continue whole tablet  Chronic obstructive pulmonary disease, unspecified COPD type ([place]) Doing well with this, last use a month ago.  Rx out of date -   Rx: albuterol (VENTOLIN HFA) 108 (90 Base) MCG/ACT inhaler; Inhale 2 puffs into the lungs every 6 (six) hours as needed for wheezing.  Controlled type 2 diabetes mellitus with chronic kidney disease, without long-term current use of insulin, unspecified CKD stage (HCC) Taking Metformin 500mg , two tablets twice a day Reports fasting glucose have been 100-120's Continue to monitor Discussed dietary and exercise modifications  Vitamin D deficiency Continue supplementation  Gastroesophageal reflux disease, esophagitis presence not specified Doing well at this time No longer taking PPI Change Reglan to PRN and monitor symptoms Monitor for triggers, may need to restart PPI? -     metoCLOPramide (REGLAN) 10 MG tablet; TAKE 1 TABLET BY MOUTH FOUR TIMES DAILY EVERY NIGHT BEFORE A MEAL AND AT BEDTIME FOR ACID REFLUX as needed.  Constipation due to pain medication therapy Has D/C oxycontin Discussed other medication contributing to this Increase water intake Continue to increase activity    Follow Up Instructions:  Discussed assessment and treatment plan with the patient. The patient was provided an opportunity to ask questions and all were answered. The patient agrees with the plan of care and demonstrates an understanding of the instructions.   The patient was advised to call back or seek an in-person evaluation if the symptoms worsen or if the condition fails to improve as anticipated.  I provided 30 minutes of non-face-to-face time during this encounter including interview, counseling, chart review, and  critical decision making was preformed.   Future Appointments  Date Time Provider [place]  10/24/2018  2:00 PM [name], MD [facility] None  12/20/2018  2:00 PM Regina Eck, CNM [place] None  01/26/2019   2:30 PM Liane Comber, NP [facility] None  07/05/2019  2:00 PM Vicie Mutters, PA-C [facility] None    ------------------------------------------------------------------------------------------------   HPI 68 y.o.female presents for constipation and to discuss her medications.  On 08/30/18 she had a revision fo her left knee with replacement.  This is the third revision.  She stopped the oxycontin 5mg  and this was causing constipation.  She has been taking tramadol and 1,000mg  of acetaminophen prn with moderate pain control.  She is battling contipation but finally has a bowel movement, two days ago. She is taking Miralax twice a day and colace once daily.  She also tried dulcolax OTC and warm prune juice.  Reports the constipation has resolved  is but going to continue colace and miralax at this time.  She reports she is moving around and walking daily as instructed and participates in home health PT / [place]. She is concerned about all of the medication she is taking and would like to discuss the necessity of them.    Past Medical History:  Diagnosis Date  . Anemia   . Anxiety   . Bell's palsy 01/1985  . COPD (chronic obstructive pulmonary disease) ([place])   . Depression   . Diabetes mellitus without complication ([place])   . GERD (gastroesophageal reflux disease)   . Hypertension   . IBS (irritable bowel syndrome) 12/89  . Obesity, Class III, BMI 40-49.9 (morbid obesity) ([place])   . OSA (obstructive sleep apnea)    no cpap  . Vitamin D deficiency      Allergies  Allergen Reactions  . Penicillins Anaphylaxis and Rash    Childhood allergy   . Sulfa Antibiotics Shortness Of Breath    Also light headed, rash  . Celebrex [Celecoxib]     Elevates Blood Pressure   . Ciprofloxacin Nausea And Vomiting  . Codeine Nausea And Vomiting    And hydrocodone  . Levaquin [Levofloxacin] Nausea And Vomiting  . Motrin Ib [Ibuprofen] Hives  . Prempro [Conj Estrog-Medroxyprogest Ace]  Hives    Current Outpatient Medications on File Prior to Visit  Medication Sig  . acetaminophen (TYLENOL) 500 MG tablet Take 500 mg by mouth 2 (two) times daily.  Marland Kitchen ALPRAZolam (XANAX) 0.25 MG tablet Take 1/2 to 1 tablet 2 x / day if needed for anxiety.  Marland Kitchen aspirin 81 MG tablet Take 81 mg by mouth at bedtime.   . Biotin 5000 MCG CAPS Take by mouth.  . Calcium Carbonate-Vit D-Min (CALCIUM 1200 PO) Take by mouth.  . cetirizine (ZYRTEC) 10 MG tablet Take 10 mg by mouth daily as needed.  . Cholecalciferol (VITAMIN D3 PO) Take 5,000 Units by mouth daily.  Marland Kitchen CINNAMON PO Take 1,500 mg by mouth.  . docusate sodium (COLACE) 100 MG capsule Take 100 mg by mouth daily as needed for mild constipation.  . fluticasone (FLONASE) 50 MCG/ACT nasal spray Place 2 sprays into the nose daily as needed for allergies.   . Magnesium 250 MG TABS Take 250 mg by mouth daily.   . Melatonin 10 MG TABS Take 10 mg by mouth at bedtime.  . meloxicam (MOBIC) 15 MG tablet Take 1/2 to 1 tablet Daily with Food for Pain & Inflammation & limit to 4 tablets/week to avoid Kidney Damage  .  metFORMIN (GLUCOPHAGE-XR) 500 MG 24 hr tablet Take 2 tablets 2 x /day with meals for Diabetes  . montelukast (SINGULAIR) 10 MG tablet TAKE 1 TABLET BY MOUTH EVERY DAY  . olmesartan-hydrochlorothiazide (BENICAR HCT) 40-12.5 MG tablet TAKE 1 TABLET BY MOUTH ONCE DAILY  . ONETOUCH DELICA LANCETS 41O MISC Check blood sugar 1 time daily-DX-E11.22  . ONETOUCH VERIO test strip USE TO CHECK BLOOD SUGAR ONCE DAILY  . Phenylephrine-Guaifenesin (MUCUS RELIEF PE PO) Take 1 tablet by mouth as needed. Take one tablet by mouth  . Probiotic Product (PROBIOTIC DAILY PO) Take by mouth.  . rosuvastatin (CRESTOR) 10 MG tablet Take 1 tablet (10 mg total) by mouth at bedtime.  Marland Kitchen venlafaxine XR (EFFEXOR-XR) 150 MG 24 hr capsule Take 150 mg by mouth 2 (two) times daily. Patient gets RX from a different provider.  . zaleplon (SONATA) 10 MG capsule Take 10 mg by mouth at  bedtime as needed for sleep.    No current facility-administered medications on file prior to visit.     ROS: all negative except above.   Physical Exam:  LMP 04/09/2009   General : Well sounding patient in no apparent distress HEENT: no hoarseness, no cough for duration of visit Lungs: speaks in complete sentences, no audible wheezing, no apparent distress Neurological: alert, oriented x 3 Psychiatric: pleasant, judgement appropriate    Garnet Sierras, NP 11:48 AM [facility]

## 2018-09-19 NOTE — Patient Instructions (Addendum)
Friday 09/16/18 you had a telephone visit with Garnet Sierras, DNP.  Below is a summary of your visit.  Below is your updated medications list  acetaminophen 500 MG tablet Commonly known as: TYLENOL Take 500 mg by mouth 2 (two) times daily.  albuterol 108 (90 Base) MCG/ACT inhaler Commonly known as: VENTOLIN HFA Use 2 inhalations 15 minutes apart every 4 hours as needed to rescue Asthma Attack  ALPRAZolam 0.25 MG tablet Commonly known as: XANAX Take 1/2 to 1 tablet 2 x / day if needed for anxiety.  aspirin 81 MG tablet Take 81 mg by mouth at bedtime.  Biotin 5000 MCG Caps Take by mouth.  CALCIUM 1200 PO Take by mouth.  cetirizine 10 MG tablet Commonly known as: ZYRTEC Take 10 mg by mouth daily as needed.  CINNAMON PO Take 1,500 mg by mouth.  docusate sodium 100 MG capsule Commonly known as: COLACE Take 100 mg by mouth daily as needed for mild constipation.  fluticasone 50 MCG/ACT nasal spray Commonly known as: FLONASE Place 2 sprays into the nose daily as needed for allergies.  hyoscyamine 0.375 MG 12 hr tablet Commonly known as: LEVBID Take 1 tablet (0.375 mg total) by mouth 2 (two) times daily. As needed.  Magnesium 250 MG Tabs Take 250 mg by mouth daily.  Melatonin 10 MG Tabs Take 10 mg by mouth at bedtime.  meloxicam 15 MG tablet Commonly known as: MOBIC Take 1/2 to 1 tablet Daily with Food for Pain & Inflammation & limit to 4 tablets/week to avoid Kidney Damage  metFORMIN 500 MG 24 hr tablet Commonly known as: GLUCOPHAGE-XR Take 2 tablets 2 x /day with meals for Diabetes  metoCLOPramide 10 MG tablet Commonly known as: REGLAN TAKE 1 TABLET BY MOUTH FOUR TIMES DAILY EVERY NIGHT BEFORE A MEAL AND AT BEDTIME FOR ACID REFLUX as needed.  montelukast 10 MG tablet Commonly known as: SINGULAIR TAKE 1 TABLET BY MOUTH EVERY DAY  MUCUS RELIEF PE PO Take 1 tablet by mouth as needed. Take one tablet by mouth  olmesartan 40 MG tablet Commonly known as: BENICAR Take 40 mg by mouth daily.   OneTouch Delica Lancets 34V Misc Check blood sugar 1 time daily-DX-E11.22  OneTouch Verio test strip Generic drug: glucose blood USE TO CHECK BLOOD SUGAR ONCE DAILY  PROBIOTIC DAILY PO Take by mouth.  rosuvastatin 10 MG tablet Commonly known as: Crestor Take 1 tablet (10 mg total) by mouth at bedtime.  venlafaxine XR 150 MG 24 hr capsule Commonly known as: EFFEXOR-XR Take 150 mg by mouth 2 (two) times daily. Patient gets RX from a different provider.  VITAMIN D3 PO Take 5,000 Units by mouth daily.  zaleplon 10 MG capsule Commonly known as: SONATA Take 10 mg by mouth at bedtime as needed for sleep.   Your blood pressure medication, if you blood pressure is 130/90 or higher take half tablet (20mg ) of Olmesartan 40mg  (benicar) daily. If your blood pressure continue to rise to 140/90 or higher, take whole tablet Olmesartan 40mg  daily.  If you have any further blood pressure concerns please let us know via MyChart or calling the office.  Continue to take a stool softener, Colace or Miralax daily to keep you regular.  Increase vegetable and water intake as well.  As you increase your activity this will also help with regular bowel movements.  Should 4-5 days go by with out a bowel movement try the warm prune juice.   Ducolax is a stimulant and should be used after  these measure if needed.    Warm prune juice and Milk of Magnesia is another stimulant you can use if constipated.

## 2018-09-23 ENCOUNTER — Other Ambulatory Visit: Payer: Self-pay | Admitting: Internal Medicine

## 2018-09-23 MED ORDER — ONDANSETRON HCL 8 MG PO TABS: ORAL_TABLET | ORAL | 0 refills | Status: DC

## 2018-10-12 DIAGNOSIS — K219 Gastro-esophageal reflux disease without esophagitis: Secondary | ICD-10-CM

## 2018-10-12 MED ORDER — PANTOPRAZOLE SODIUM 40 MG PO TBEC: DELAYED_RELEASE_TABLET | ORAL | 0 refills | Status: DC

## 2018-10-14 ENCOUNTER — Other Ambulatory Visit: Payer: Self-pay | Admitting: Internal Medicine

## 2018-10-14 ENCOUNTER — Emergency Department (HOSPITAL_COMMUNITY)
Admission: EM | Admit: 2018-10-14 | Discharge: 2018-10-14 | Disposition: A | Discharge status: Home / Self Care | Payer: Medicare Other | Attending: Emergency Medicine | Admitting: Emergency Medicine

## 2018-10-14 ENCOUNTER — Encounter (HOSPITAL_COMMUNITY): Payer: Self-pay

## 2018-10-14 ENCOUNTER — Other Ambulatory Visit: Payer: Self-pay

## 2018-10-14 DIAGNOSIS — J449 Chronic obstructive pulmonary disease, unspecified: Secondary | ICD-10-CM | POA: Insufficient documentation

## 2018-10-14 DIAGNOSIS — I10 Essential (primary) hypertension: Secondary | ICD-10-CM | POA: Insufficient documentation

## 2018-10-14 DIAGNOSIS — Z96652 Presence of left artificial knee joint: Secondary | ICD-10-CM | POA: Insufficient documentation

## 2018-10-14 DIAGNOSIS — Z79899 Other long term (current) drug therapy: Secondary | ICD-10-CM | POA: Insufficient documentation

## 2018-10-14 DIAGNOSIS — E119 Type 2 diabetes mellitus without complications: Secondary | ICD-10-CM | POA: Insufficient documentation

## 2018-10-14 DIAGNOSIS — F17210 Nicotine dependence, cigarettes, uncomplicated: Secondary | ICD-10-CM | POA: Insufficient documentation

## 2018-10-14 DIAGNOSIS — Z7984 Long term (current) use of oral hypoglycemic drugs: Secondary | ICD-10-CM | POA: Diagnosis not present

## 2018-10-14 DIAGNOSIS — Z7982 Long term (current) use of aspirin: Secondary | ICD-10-CM | POA: Insufficient documentation

## 2018-10-14 DIAGNOSIS — N3091 Cystitis, unspecified with hematuria: Secondary | ICD-10-CM | POA: Diagnosis not present

## 2018-10-14 DIAGNOSIS — R110 Nausea: Secondary | ICD-10-CM | POA: Diagnosis present

## 2018-10-14 DIAGNOSIS — N3001 Acute cystitis with hematuria: Secondary | ICD-10-CM

## 2018-10-14 LAB — URINALYSIS, ROUTINE W REFLEX MICROSCOPIC
Bilirubin Urine: NEGATIVE
Glucose, UA: NEGATIVE mg/dL
Ketones, ur: 20 mg/dL — AB
Nitrite: NEGATIVE
Protein, ur: 30 mg/dL — AB
Specific Gravity, Urine: 1.02 (ref 1.005–1.030)
WBC, UA: >50 WBC/hpf — ABNORMAL HIGH (ref 0–5)
pH: 5 (ref 5.0–8.0)

## 2018-10-14 LAB — CBC WITH DIFFERENTIAL/PLATELET
Abs Immature Granulocytes: 0.01 10*3/uL (ref 0.00–0.07)
Basophils Absolute: 0 10*3/uL (ref 0.0–0.1)
Basophils Relative: 1 %
Eosinophils Absolute: 0.1 10*3/uL (ref 0.0–0.5)
Eosinophils Relative: 2 %
HCT: 38.4 % (ref 36.0–46.0)
Hemoglobin: 11.9 g/dL — ABNORMAL LOW (ref 12.0–15.0)
Immature Granulocytes: 0 %
Lymphocytes Relative: 20 %
Lymphs Abs: 1.5 10*3/uL (ref 0.7–4.0)
MCH: 27.6 pg (ref 26.0–34.0)
MCHC: 31 g/dL (ref 30.0–36.0)
MCV: 89.1 fL (ref 80.0–100.0)
Monocytes Absolute: 0.6 10*3/uL (ref 0.1–1.0)
Monocytes Relative: 8 %
Neutro Abs: 5.3 10*3/uL (ref 1.7–7.7)
Neutrophils Relative %: 69 %
Platelets: 234 10*3/uL (ref 150–400)
RBC: 4.31 MIL/uL (ref 3.87–5.11)
RDW: 14.6 % (ref 11.5–15.5)
WBC: 7.6 10*3/uL (ref 4.0–10.5)
nRBC: 0 % (ref 0.0–0.2)

## 2018-10-14 LAB — COMPREHENSIVE METABOLIC PANEL
ALT: 26 U/L (ref 0–44)
AST: 34 U/L (ref 15–41)
Albumin: 3.7 g/dL (ref 3.5–5.0)
Alkaline Phosphatase: 150 U/L — ABNORMAL HIGH (ref 38–126)
Anion gap: 11 (ref 5–15)
BUN: 10 mg/dL (ref 8–23)
CO2: 26 mmol/L (ref 22–32)
Calcium: 9.4 mg/dL (ref 8.9–10.3)
Chloride: 103 mmol/L (ref 98–111)
Creatinine, Ser: 0.8 mg/dL (ref 0.44–1.00)
GFR calc Af Amer: >60 mL/min (ref >60)
GFR calc non Af Amer: >60 mL/min (ref >60)
Glucose, Bld: 103 mg/dL — ABNORMAL HIGH (ref 70–99)
Potassium: 3.5 mmol/L (ref 3.5–5.1)
Sodium: 140 mmol/L (ref 135–145)
Total Bilirubin: 0.6 mg/dL (ref 0.3–1.2)
Total Protein: 7.2 g/dL (ref 6.5–8.1)

## 2018-10-14 LAB — LIPASE, BLOOD: Lipase: 24 U/L (ref 11–51)

## 2018-10-14 MED ORDER — LACTATED RINGERS IV BOLUS
1000.0000 mL | Freq: Once | INTRAVENOUS | Status: AC
  Administered 2018-10-14: 1000 mL via INTRAVENOUS

## 2018-10-14 MED ORDER — NITROFURANTOIN MONOHYD MACRO 100 MG PO CAPS: 100.0000 mg | ORAL_CAPSULE | Freq: Two times a day (BID) | ORAL | 0 refills | Status: DC

## 2018-10-14 MED ORDER — PROMETHAZINE HCL 25 MG/ML IJ SOLN
12.5000 mg | Freq: Once | INTRAMUSCULAR | Status: AC
  Administered 2018-10-14: 12.5 mg via INTRAVENOUS
  Filled 2018-10-14: qty 1

## 2018-10-14 MED ORDER — PROMETHAZINE HCL 25 MG PO TABS: 25.0000 mg | ORAL_TABLET | Freq: Four times a day (QID) | ORAL | 0 refills | Status: DC | PRN

## 2018-10-14 MED ORDER — NITROFURANTOIN MONOHYD MACRO 100 MG PO CAPS
100.0000 mg | ORAL_CAPSULE | Freq: Once | ORAL | Status: AC
  Administered 2018-10-14: 100 mg via ORAL
  Filled 2018-10-14: qty 1

## 2018-10-14 NOTE — ED Triage Notes (Addendum)
Patient reports nausea X8 days.   Patient has seen PCP and was given prescription for Zofran, Pepcid, Protonix, and sleeping medications. Patient reports no relief after using medication.   Denies abdominal pain.  Denies emesis.   Reports diarrhea 7 days ago.  Reports decreased appetite from nausea.   Patient states she has not taken any pain medication in 7 days.    Patient uses a walker (knee replacement surgery 2 months ago on left knee).   A/Ox4

## 2018-10-14 NOTE — ED Notes (Addendum)
Pt able to ambulate to bathroom independently using walker.

## 2018-10-15 ENCOUNTER — Other Ambulatory Visit: Payer: Self-pay | Admitting: Internal Medicine

## 2018-10-15 DIAGNOSIS — R110 Nausea: Secondary | ICD-10-CM

## 2018-10-15 DIAGNOSIS — R1011 Right upper quadrant pain: Secondary | ICD-10-CM

## 2018-10-15 LAB — URINE CULTURE

## 2018-10-18 ENCOUNTER — Ambulatory Visit
Admission: RE | Admit: 2018-10-18 | Discharge: 2018-10-18 | Disposition: A | Discharge status: Home / Self Care | Payer: Medicare Other | Source: Ambulatory Visit | Attending: Internal Medicine | Admitting: Internal Medicine

## 2018-10-18 DIAGNOSIS — R110 Nausea: Secondary | ICD-10-CM

## 2018-10-18 DIAGNOSIS — R1011 Right upper quadrant pain: Secondary | ICD-10-CM

## 2018-10-19 ENCOUNTER — Telehealth: Payer: Self-pay | Admitting: *Deleted

## 2018-10-19 NOTE — Progress Notes (Signed)
Assessment and Plan:  Beth [name] was seen today for acute visit, nausea and other.  Diagnoses and all orders for this visit:  Nausea Continue Reglan, Protonix and pepsid May use Phenergan prn, monitor for constipation Increase fluid intake Attempt eating while nauseated -     CBC with Differential/Platelet -     COMPLETE METABOLIC PANEL WITH GFR -     Amylase -     Lipase  Right upper quadrant pain Will do refferal for GI, Dr Collene Mares  Other fatigue Labs, increase intake when nausea is reduced Consider eating several small meals throughout the day to reduce complete stomach emptying  Hypertension Continue to monitor blood pressure Goal 130/90 or less Restart blood pressure medication if consistent elevation  Type II diabetes with hyperlipidemia Continue to monitor blood glucose Has put Metformin on hold with GI symptoms  Patient is to go to drive through [id number] testing related to previous diarrhea, fatigue, nausea.   Patient is agreeable to plan of care, all questions answered.  Further disposition pending results of labs. Discussed med's effects and SE's.   Over 30 minutes of exam, counseling, chart review, and critical decision making was performed.   Future Appointments  Date Time Provider [place]  11/24/2018  2:00 PM Liane Comber, NP [facility] None  12/20/2018  2:00 PM Regina Eck, CNM [place] None  01/26/2019  2:30 PM Liane Comber, NP [facility] None  07/05/2019  2:00 PM Vicie Mutters, PA-C [facility] None  11/01/2019  2:00 PM [name], MD [facility] None    ------------------------------------------------------------------------------------------------------------------   HPI 68 y.o.female presents for persistent nausea.  She reports that it is constant and began in July. She is s/p left total knee on 08/30/18 and had the hospital follow up on appointment with our office 09/16/18.  During this appointment she had reports of constipation  and interventions were provided.  Seven days later, 09/23/18, was the initial contact for nausea with eating and lack of appetite.  It was recommended she stop her meloxicam, continue to hold hycosamine.  Start reglan before meals, start pepsid and protonix.  She was given Zofran PRN. Contact to out office on 10/12/18 with continuation of symptoms.  Reports they had resolved for a period of time but now have returned.  She was unable t \\o  eat and drink and the zofran was not helping and she had a few instances of diarrhea.  These symptoms were persistent and it was recommended she be evaluated by ED and treated for UTI with antibiotics.  Culture results negative for growth.  She was also given phenergan 25mg  PRN.  She reports this has worked the best and she is able to eat after onset of medication. Her appetite remains decreased.  She has had a 20lb weight loss over the course of the past two months.  She has been fatigued since this started and concerned this has not resolved. She has been on a bland diet.  She reports she is able to drink water with ginger-ale mixed with it.  She denies any emesis or diarrhea. Last bowel movement was two days ago, soft and formed.  Prior to this she had 2-3 days of  Water diarrhea, that has since resolved. She has had decreased appetite   She is still taking the Reglan, Protonix and pepsid.  She reports she has had minimal improvement in her symptoms.    Just finished macrobid for UTI.  She has eliminated most of her medications and has been slowly adding them  back.  Recently restarted back on effexor, myrbetriq Started taking xanax prn  Reports that every every surgery she has episodes of severe nausea but a month or so after recovery.  Past Medical History:  Diagnosis Date  . Anemia   . Anxiety   . Bell's palsy 01/1985  . COPD (chronic obstructive pulmonary disease) ([place])   . Depression   . Diabetes mellitus without complication ([place])   . GERD (gastroesophageal  reflux disease)   . Hypertension   . IBS (irritable bowel syndrome) 12/89  . Obesity, Class III, BMI 40-49.9 (morbid obesity) ([place])   . OSA (obstructive sleep apnea)    no cpap  . Vitamin D deficiency      Allergies  Allergen Reactions  . Penicillins Anaphylaxis and Rash    Childhood allergy Did it involve swelling of the face/tongue/throat, SOB, or low BP? Yes Did it involve sudden or severe rash/hives, skin peeling, or any reaction on the inside of your mouth or nose? Yes Did you need to seek medical attention at a hospital or doctor's office? Yes When did it last happen?Childhood rxn If all above answers are "NO", may proceed with cephalosporin use.  . Sulfa Antibiotics Shortness Of Breath    Also light headed, rash  . Celebrex [Celecoxib] Other (See Comments)    Elevates Blood Pressure   . Ciprofloxacin Nausea And Vomiting  . Codeine Nausea And Vomiting    And hydrocodone  . Levaquin [Levofloxacin] Nausea And Vomiting  . Motrin Ib [Ibuprofen] Hives  . Prempro [Conj Estrog-Medroxyprogest Ace] Hives    Current Outpatient Medications on File Prior to Visit  Medication Sig  . albuterol (VENTOLIN HFA) 108 (90 Base) MCG/ACT inhaler Use 2 inhalations 15 minutes apart every 4 hours as needed to rescue Asthma Attack (Patient taking differently: Inhale 2 puffs into the lungs every 4 (four) hours as needed for wheezing or shortness of breath. )  . ALPRAZolam (XANAX) 0.25 MG tablet Take 1/2 to 1 tablet 2 x / day if needed for anxiety. (Patient taking differently: Take 0.25 mg by mouth 2 (two) times daily. )  . Cholecalciferol (VITAMIN D3) 125 MCG (5000 UT) CAPS Take 5,000 Units by mouth daily.  . metoCLOPramide (REGLAN) 10 MG tablet TAKE 1 TABLET BY MOUTH FOUR TIMES DAILY EVERY NIGHT BEFORE A MEAL AND AT BEDTIME FOR ACID REFLUX as needed. (Patient taking differently: Take 10 mg by mouth every 6 (six) hours as needed for nausea or vomiting. )  . montelukast (SINGULAIR) 10 MG tablet TAKE  1 TABLET BY MOUTH EVERY DAY (Patient taking differently: Take 10 mg by mouth daily. )  . MYRBETRIQ 50 MG TB24 tablet Take 50 mg by mouth daily.  Marland Kitchen olmesartan-hydrochlorothiazide (BENICAR HCT) 40-12.5 MG tablet Take 0.5-1 tablets by mouth See admin instructions. Take 0.5 tablet by mouth daily if BP is >130/90 and 1 tablet by mouth daily if BP is >140/90  . ONETOUCH DELICA LANCETS 97C MISC Check blood sugar 1 time daily-DX-E11.22  . ONETOUCH VERIO test strip USE TO CHECK BLOOD SUGAR ONCE DAILY  . pantoprazole (PROTONIX) 40 MG tablet Take 1 tablet every Morning for Acid Indigestion (Patient taking differently: Take 40 mg by mouth daily. )  . promethazine (PHENERGAN) 25 MG tablet Take 1 tablet (25 mg total) by mouth every 6 (six) hours as needed for nausea or vomiting.  . venlafaxine XR (EFFEXOR-XR) 150 MG 24 hr capsule Take 150 mg by mouth 2 (two) times daily.   . zaleplon (SONATA) 10 MG  capsule Take 10 mg by mouth at bedtime as needed for sleep.   . hyoscyamine (LEVBID) 0.375 MG 12 hr tablet Take 1 tablet (0.375 mg total) by mouth 2 (two) times daily. As needed. (Patient not taking: Reported on 10/20/2018)  . metFORMIN (GLUCOPHAGE-XR) 500 MG 24 hr tablet Take 2 tablets 2 x /day with meals for Diabetes (Patient not taking: Reported on 10/20/2018)  . rosuvastatin (CRESTOR) 10 MG tablet Take 1 tablet (10 mg total) by mouth at bedtime. (Patient not taking: Reported on 10/20/2018)   No current facility-administered medications on file prior to visit.     ROS: all negative except above.   Physical Exam:  BP (!) 136/92   Pulse 71   Temp 97.6 F (36.4 C)   Wt 231 lb (104.8 kg)   LMP 04/09/2009   SpO2 99%   BMI 38.44 kg/m   General Appearance: Well nourished, in no apparent distress. Eyes: PERRLA, EOMs, conjunctiva no swelling or erythema Sinuses: No Frontal/maxillary tenderness ENT/Mouth: Ext aud canals clear, TMs without erythema, bulging. No erythema, swelling, or exudate on post pharynx.   Tonsils not swollen or erythematous. Hearing normal.  Neck: Supple, thyroid normal.  Respiratory: Respiratory effort normal, BS equal bilaterally without rales, rhonchi, wheezing or stridor.  Cardio: RRR with no MRGs. Brisk peripheral pulses without edema.  Abdomen: Soft, Hyperactive BS in all quadrants. Tenderness noted to RU, no organomegaly. Non tender other quadrants, no guarding, rebound, hernias, masses. Lymphatics: Non tender without lymphadenopathy.  Musculoskeletal: Full ROM, 5/5 strength, normal gait with rolling walker.  Skin: Warm, dry without rashes, lesions, ecchymosis.  Neuro: Cranial nerves intact. Normal muscle tone, no cerebellar symptoms. Sensation intact.  Psych: Awake and oriented X 3, normal affect, Insight and Judgment appropriate.     Garnet Sierras, NP 3:07 PM [facility]

## 2018-10-19 NOTE — ED Provider Notes (Signed)
[facility] DEPT Provider Note   CSN: [id number] Arrival date & time: 10/14/18  1117     History   Chief Complaint Chief Complaint  Patient presents with  . Nausea    HPI Beth [name] is a 68 y.o. female.     HPI   68 year old female with persistent nausea.  Onset about a week ago.  Waxes and wanes without appreciable exacerbating relieving factors.  Initially she had some diarrhea but this has since resolved.  No vomiting.  She states her appetite is been poor secondary to all the nausea though.  She spoke with her PCP about her symptoms.  She is tried Zofran, Pepcid, Protonix without significant improvement.  No fevers or chills.  No acute urinary complaints.  No sick contacts that she is aware of.  Past Medical History:  Diagnosis Date  . Anemia   . Anxiety   . Bell's palsy 01/1985  . COPD (chronic obstructive pulmonary disease) ([place])   . Depression   . Diabetes mellitus without complication ([place])   . GERD (gastroesophageal reflux disease)   . Hypertension   . IBS (irritable bowel syndrome) 12/89  . Obesity, Class III, BMI 40-49.9 (morbid obesity) ([place])   . OSA (obstructive sleep apnea)    no cpap  . Vitamin D deficiency     Patient Active Problem List   Diagnosis Date Noted  . Type 2 diabetes mellitus with hyperlipidemia ([place]) 06/27/2018  . FHx: heart disease 02/14/2018  . Osteopenia 11/09/2017  . Seasonal allergies 12/15/2016  . COPD (chronic obstructive pulmonary disease) ([place]) 12/20/2014  . Fibroid uterus 12/20/2014  . Obesity hypoventilation syndrome ([place]) 11/19/2014  . OSA and COPD overlap syndrome ([place]) 11/19/2014  . GERD  11/19/2014  . Hyperlipidemia 09/27/2013  . Medication management 09/27/2013  . 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 Vitamin D deficiency   . Iron deficiency anemia 06/17/2012  . Essential hypertension 06/02/2012  . Depression, major, in remission ([place]) 04/09/1988  . IBS (irritable bowel  syndrome) 02/07/1988  . Bell's palsy 01/07/1985    Past Surgical History:  Procedure Laterality Date  . BLADDER SUSPENSION  03/2006, 07/2006, 01/2007   for stress incontinence  . COLONOSCOPY  05/2005   Normal, recheck 10 yrs  . JOINT REPLACEMENT     x3 lt knee  . LUMBAR FUSION Right 09/2016   L4-5 fusion by Dr. Sherley Bounds  . TONSILLECTOMY AND ADENOIDECTOMY    . TOTAL KNEE ARTHROPLASTY Left 2012   x 2 left knee  . TOTAL KNEE REVISION  2014     OB History    Gravida  3   Para  1   Term  1   Preterm      AB  2   Living  1     SAB      TAB  2   Ectopic      Multiple      Live Births               Home Medications    Prior to Admission medications   Medication Sig Start Date End Date Taking? Authorizing Provider  acetaminophen (TYLENOL) 500 MG tablet Take 500 mg by mouth 2 (two) times daily as needed for mild pain.    Yes [provider]  albuterol (VENTOLIN HFA) 108 (90 Base) MCG/ACT inhaler Use 2 inhalations 15 minutes apart every 4 hours as needed to rescue Asthma Attack Patient taking differently: Inhale  2 puffs into the lungs every 4 (four) hours as needed for wheezing or shortness of breath.  09/16/18  Yes [name], MD  ALPRAZolam Duanne Moron) 0.25 MG tablet Take 1/2 to 1 tablet 2 x / day if needed for anxiety. Patient taking differently: Take 0.25 mg by mouth 2 (two) times daily.  07/19/18  Yes [name], MD  aspirin 81 MG tablet Take 81 mg by mouth at bedtime.    Yes [provider]  cetirizine (ZYRTEC) 10 MG tablet Take 10 mg by mouth daily as needed for allergies or rhinitis.    Yes [provider]  Cholecalciferol (VITAMIN D3) 125 MCG (5000 UT) CAPS Take 5,000 Units by mouth daily.   Yes [provider]  fluticasone (FLONASE) 50 MCG/ACT nasal spray Place 2 sprays into the nose daily as needed for allergies.    Yes [provider]  guaiFENesin (MUCINEX) 600 MG 12 hr tablet Take 600 mg by mouth daily  as needed for cough or to loosen phlegm.   Yes [provider]  Magnesium 250 MG TABS Take 250 mg by mouth at bedtime.    Yes [provider]  Melatonin 10 MG TABS Take 10 mg by mouth at bedtime as needed (sleep).    Yes [provider]  metFORMIN (GLUCOPHAGE-XR) 500 MG 24 hr tablet Take 2 tablets 2 x /day with meals for Diabetes Patient taking differently: Take 1,000 mg by mouth 2 (two) times daily with a meal.  06/17/18  Yes [name], MD  metoCLOPramide (REGLAN) 10 MG tablet TAKE 1 TABLET BY MOUTH FOUR TIMES DAILY EVERY NIGHT BEFORE A MEAL AND AT BEDTIME FOR ACID REFLUX as needed. Patient taking differently: Take 10 mg by mouth every 6 (six) hours as needed for nausea or vomiting.  09/16/18  Yes McClanahan, Kyra, NP  montelukast (SINGULAIR) 10 MG tablet TAKE 1 TABLET BY MOUTH EVERY DAY Patient taking differently: Take 10 mg by mouth daily.  11/13/17  Yes [name], MD  MYRBETRIQ 50 MG TB24 tablet Take 50 mg by mouth daily. 10/05/18  Yes [provider]  olmesartan-hydrochlorothiazide (BENICAR HCT) 40-12.5 MG tablet Take 0.5-1 tablets by mouth See admin instructions. Take 0.5 tablet by mouth daily if BP is >130/90 and 1 tablet by mouth daily if BP is >140/90 10/05/18  Yes [provider]  ondansetron (ZOFRAN) 8 MG tablet Take 1/2 to 1 tablet 3 x /day before meals for Nausea Patient taking differently: Take 4-8 mg by mouth 3 (three) times daily as needed for nausea or vomiting.  09/23/18  Yes [name], MD  pantoprazole (PROTONIX) 40 MG tablet Take 1 tablet every Morning for Acid Indigestion Patient taking differently: Take 40 mg by mouth daily.  10/12/18  Yes [name], MD  rosuvastatin (CRESTOR) 10 MG tablet Take 1 tablet (10 mg total) by mouth at bedtime. 06/29/18 06/29/19 Yes Vicie Mutters, PA-C  venlafaxine XR (EFFEXOR-XR) 150 MG 24 hr capsule Take 150 mg by mouth 2 (two) times daily.    Yes [provider]  zaleplon  (SONATA) 10 MG capsule Take 10 mg by mouth at bedtime as needed for sleep.    Yes [provider]  hyoscyamine (LEVBID) 0.375 MG 12 hr tablet Take 1 tablet (0.375 mg total) by mouth 2 (two) times daily. As needed. Patient not taking: Reported on 10/14/2018 09/16/18   Garnet Sierras, NP  meloxicam (MOBIC) 15 MG tablet Take 1/2 to 1 tablet Daily with Food for Pain & Inflammation &  limit to 4 tablets/week to avoid Kidney Damage Patient not taking: Reported on 10/14/2018 07/30/18   [name], MD  nitrofurantoin, macrocrystal-monohydrate, (MACROBID) 100 MG capsule Take 1 capsule (100 mg total) by mouth 2 (two) times daily. 10/14/18   Virgel Manifold, MD  [facility] DELICA LANCETS 87O MISC Check blood sugar 1 time daily-DX-E11.22 11/13/16   [name], MD  [facility] VERIO test strip USE TO CHECK BLOOD SUGAR ONCE DAILY 01/17/18   [name], MD  promethazine (PHENERGAN) 25 MG tablet Take 1 tablet (25 mg total) by mouth every 6 (six) hours as needed for nausea or vomiting. 10/14/18   Virgel Manifold, MD    Family History Family History  Problem Relation Age of Onset  . Hypertension Father   . Diabetes Father   . Parkinson's disease Father   . Dementia Mother   . Heart attack Paternal Grandfather 28       Died MI    Social History Social History   Tobacco Use  . Smoking status: Current Every Day Smoker    Packs/day: 0.50    Years: 40.00    Pack years: 20.00    Types: Cigarettes  . Smokeless tobacco: Never Used  Substance Use Topics  . Alcohol use: No  . Drug use: No     Allergies   Penicillins, Sulfa antibiotics, Celebrex [celecoxib], Ciprofloxacin, Codeine, Levaquin [levofloxacin], Motrin ib [ibuprofen], and Prempro [conj estrog-medroxyprogest ace]   Review of Systems Review of Systems  All systems reviewed and negative, other than as noted in HPI.  Physical Exam Updated Vital Signs BP 133/85 (BP Location: Right Arm)   Pulse 96   Temp 97.7 F (36.5 C) (Oral)    Resp 18   LMP 04/09/2009   SpO2 100%   Physical Exam Vitals signs and nursing note reviewed.  Constitutional:      General: She is not in acute distress.    Appearance: She is well-developed.  HENT:     Head: Normocephalic and atraumatic.  Eyes:     General:        Right eye: No discharge.        Left eye: No discharge.     Conjunctiva/sclera: Conjunctivae normal.  Neck:     Musculoskeletal: Neck supple.  Cardiovascular:     Rate and Rhythm: Normal rate and regular rhythm.     Heart sounds: Normal heart sounds. No murmur. No friction rub. No gallop.   Pulmonary:     Effort: Pulmonary effort is normal. No respiratory distress.     Breath sounds: Normal breath sounds.  Abdominal:     General: There is no distension.     Palpations: Abdomen is soft.     Tenderness: There is no abdominal tenderness.  Musculoskeletal:        General: No tenderness.  Skin:    General: Skin is warm and dry.  Neurological:     Mental Status: She is alert.  Psychiatric:        Behavior: Behavior normal.        Thought Content: Thought content normal.      ED Treatments / Results  Labs (all labs ordered are listed, but only abnormal results are displayed) Labs Reviewed  URINE CULTURE - Abnormal; Notable for the following components:      Result Value   Culture MULTIPLE SPECIES PRESENT, SUGGEST RECOLLECTION (*)    All other components within normal limits  CBC WITH DIFFERENTIAL/PLATELET - Abnormal; Notable for the following components:  Hemoglobin 11.9 (*)    All other components within normal limits  COMPREHENSIVE METABOLIC PANEL - Abnormal; Notable for the following components:   Glucose, Bld 103 (*)    Alkaline Phosphatase 150 (*)    All other components within normal limits  URINALYSIS, ROUTINE W REFLEX MICROSCOPIC - Abnormal; Notable for the following components:   APPearance HAZY (*)    Hgb urine dipstick SMALL (*)    Ketones, ur 20 (*)    Protein, ur 30 (*)    Leukocytes,Ua  LARGE (*)    WBC, UA >50 (*)    Bacteria, UA RARE (*)    All other components within normal limits  LIPASE, BLOOD    EKG None  Radiology US Abdomen Complete  Result Date: 10/18/2018 CLINICAL DATA:  Right upper quadrant tenderness.  Nausea for 5 days. EXAM: ABDOMEN ULTRASOUND COMPLETE COMPARISON:  None. FINDINGS: Gallbladder: No gallstones or wall thickening visualized. No sonographic Murphy sign noted by sonographer. Common bile duct: Diameter: 3.2 mm Liver: No focal lesion identified. Within normal limits in parenchymal echogenicity. Portal vein is patent on color Doppler imaging with normal direction of blood flow towards the liver. IVC: No abnormality visualized. Pancreas: Visualized portion unremarkable. Spleen: Size and appearance within normal limits. Right Kidney: Length: 10.4 cm. Echogenicity within normal limits. No mass or hydronephrosis visualized. Left Kidney: Length: 10.5 cm. Echogenicity within normal limits. No mass or hydronephrosis visualized. Abdominal aorta: No aneurysm visualized. Other findings: None. IMPRESSION: Normal study. Electronically Signed   By: Dorise Bullion III M.D   On: 10/18/2018 15:45    Procedures Procedures (including critical care time)  Medications Ordered in ED Medications  lactated ringers bolus 1,000 mL (0 mLs Intravenous Stopped 10/14/18 1531)  promethazine (PHENERGAN) injection 12.5 mg (12.5 mg Intravenous Given 10/14/18 1317)  nitrofurantoin (macrocrystal-monohydrate) (MACROBID) capsule 100 mg (100 mg Oral Given 10/14/18 1348)     Initial Impression / Assessment and Plan / ED Course  I have reviewed the triage vital signs and the nursing notes.  Pertinent labs & imaging results that were available during my care of the patient were reviewed by me and considered in my medical decision making (see chart for details).        68 year old female with persistent nausea.  I suspect her symptoms are possibly from a UTI.  UA is consistent with this.   Abdominal exam is pretty benign.  She has tolerated p.o. here in the emergency room.  I do not feel that emergent imaging is indicated or will likely be of much utility with the way she describes her symptoms and her exam.  We will put on nitrofurantoin.  She has multiple other allergies.  Continue symptomatic treatment.  If her symptoms do not improve despite the antibiotics she may need further work-up including imaging.  Return precautions were discussed.  Final Clinical Impressions(s) / ED Diagnoses   Final diagnoses:  Acute cystitis with hematuria  Nausea    ED Discharge Orders         Ordered    nitrofurantoin, macrocrystal-monohydrate, (MACROBID) 100 MG capsule  2 times daily     10/14/18 1452    promethazine (PHENERGAN) 25 MG tablet  Every 6 hours PRN     10/14/18 1452           Virgel Manifold, MD 10/19/18 1454

## 2018-10-19 NOTE — Telephone Encounter (Signed)
Patient called and reported, even though her ultrasound was normal, she is not feeling better and still nauseated.  Per Dr Melford Aase, patient will need an office visit to evaluate.  Appointment scheduled for 10/20/2018 with Garnet Sierras, NP.

## 2018-10-20 ENCOUNTER — Other Ambulatory Visit: Payer: Self-pay

## 2018-10-20 ENCOUNTER — Ambulatory Visit (INDEPENDENT_AMBULATORY_CARE_PROVIDER_SITE_OTHER): Payer: Medicare Other | Admitting: Adult Health Nurse Practitioner

## 2018-10-20 ENCOUNTER — Encounter: Payer: Self-pay | Admitting: Adult Health Nurse Practitioner

## 2018-10-20 VITALS — BP 136/92 | HR 71 | Temp 97.6°F | Wt 231.0 lb

## 2018-10-20 DIAGNOSIS — R1011 Right upper quadrant pain: Secondary | ICD-10-CM | POA: Diagnosis not present

## 2018-10-20 DIAGNOSIS — R5383 Other fatigue: Secondary | ICD-10-CM

## 2018-10-20 DIAGNOSIS — E1169 Type 2 diabetes mellitus with other specified complication: Secondary | ICD-10-CM

## 2018-10-20 DIAGNOSIS — I10 Essential (primary) hypertension: Secondary | ICD-10-CM

## 2018-10-20 DIAGNOSIS — Z20822 Contact with and (suspected) exposure to covid-19: Secondary | ICD-10-CM

## 2018-10-20 DIAGNOSIS — E785 Hyperlipidemia, unspecified: Secondary | ICD-10-CM

## 2018-10-20 DIAGNOSIS — R110 Nausea: Secondary | ICD-10-CM | POA: Diagnosis not present

## 2018-10-20 MED ORDER — PROMETHAZINE HCL 25 MG PO TABS: 25.0000 mg | ORAL_TABLET | Freq: Four times a day (QID) | ORAL | 3 refills | Status: DC | PRN

## 2018-10-20 NOTE — Patient Instructions (Addendum)
We have placed a refferal to GI for you.    Continue the same regiment with Reglan, protonix and pepsid.    Continue to monitor your blood pressure.  Should you have an increase, please re-start medication.  Continue to monitor you blood sugars.  Eat small frequent meals of bland foods to prevent complete emptying of your stomach.  Try eating while you are nauseated, to see if this makes it better or worse.   Bland Diet A bland diet consists of foods that are often soft and do not have a lot of fat, fiber, or extra seasonings. Foods without fat, fiber, or seasoning are easier for the body to digest. They are also less likely to irritate your mouth, throat, stomach, and other parts of your digestive system. A bland diet is sometimes called a BRAT diet. What is my plan? Your health care provider or food and nutrition specialist (dietitian) may recommend specific changes to your diet to prevent symptoms or to treat your symptoms. These changes may include:  Eating small meals often.  Cooking food until it is soft enough to chew easily.  Chewing your food well.  Drinking fluids slowly.  Not eating foods that are very spicy, sour, or fatty.  Not eating citrus fruits, such as oranges and grapefruit. What do I need to know about this diet?  Eat a variety of foods from the bland diet food list.  Do not follow a bland diet longer than needed.  Ask your health care provider whether you should take vitamins or supplements. What foods can I eat? Grains  Hot cereals, such as cream of wheat. Rice. Bread, crackers, or tortillas made from refined white flour. Vegetables Canned or cooked vegetables. Mashed or boiled potatoes. Fruits  Bananas. Applesauce. Other types of cooked or canned fruit with the skin and seeds removed, such as canned peaches or pears. Meats and other proteins  Scrambled eggs. Creamy peanut butter or other nut butters. Lean, well-cooked meats, such as chicken or  fish. Tofu. Soups or broths. Dairy Low-fat dairy products, such as milk, cottage cheese, or yogurt. Beverages  Water. Herbal tea. Apple juice. Fats and oils Mild salad dressings. Canola or olive oil. Sweets and desserts Pudding. Custard. Fruit gelatin. Ice cream. The items listed above may not be a complete list of recommended foods and beverages. Contact a dietitian for more options. What foods are not recommended? Grains Whole grain breads and cereals. Vegetables Raw vegetables. Fruits Raw fruits, especially citrus, berries, or dried fruits. Dairy Whole fat dairy foods. Beverages Caffeinated drinks. Alcohol. Seasonings and condiments Strongly flavored seasonings or condiments. Hot sauce. Salsa. Other foods Spicy foods. Fried foods. Sour foods, such as pickled or fermented foods. Foods with high sugar content. Foods high in fiber. The items listed above may not be a complete list of foods and beverages to avoid. Contact a dietitian for more information. Summary  A bland diet consists of foods that are often soft and do not have a lot of fat, fiber, or extra seasonings.  Foods without fat, fiber, or seasoning are easier for the body to digest.  Check with your health care provider to see how long you should follow this diet plan. It is not meant to be followed for long periods. This information is not intended to replace advice given to you by your health care provider. Make sure you discuss any questions you have with your health care provider. Document Released: 06/17/2015 Document Revised: 03/24/2017 Document Reviewed: 03/24/2017 Elsevier Patient  Education  [employer].   Nausea, Adult Nausea is feeling sick to your stomach or feeling that you are about to throw up (vomit). Feeling sick to your stomach is usually not serious, but it may be an early sign of a more serious medical problem. As you feel sicker to your stomach, you may throw up. If you throw up, or if  you are not able to drink enough fluids, there is a risk that you may lose too much water in your body (get dehydrated). If you lose too much water in your body, you may:  Feel tired.  Feel thirsty.  Have a dry mouth.  Have cracked lips.  Go pee (urinate) less often. Older adults and people who have other diseases or a weak body defense system (immune system) have a higher risk of losing too much water in the body. The main goals of treating this condition are:  To relieve your nausea.  To ensure your nausea occurs less often.  To prevent throwing up and losing too much fluid. Follow these instructions at home: Watch your symptoms for any changes. Tell your doctor about them. Follow these instructions as told by your doctor. Eating and drinking      Take an ORS (oral rehydration solution). This is a drink that is sold at pharmacies and stores.  Drink clear fluids in small amounts as you are able. These include: ? Water. ? Ice chips. ? Fruit juice that has water added (diluted fruit juice). ? Low-calorie sports drinks.  Eat bland, easy-to-digest foods in small amounts as you are able, such as: ? Bananas. ? Applesauce. ? Rice. ? Low-fat (lean) meats. ? Toast. ? Crackers.  Avoid drinking fluids that have a lot of sugar or caffeine in them. This includes energy drinks, sports drinks, and soda.  Avoid alcohol.  Avoid spicy or fatty foods. General instructions  Take over-the-counter and prescription medicines only as told by your doctor.  Rest at home while you get better.  Drink enough fluid to keep your pee (urine) pale yellow.  Take slow and deep breaths when you feel sick to your stomach.  Avoid food or things that have strong smells.  Wash your hands often with soap and water. If you cannot use soap and water, use hand sanitizer.  Make sure that all people in your home wash their hands well and often.  Keep all follow-up visits as told by your doctor.  This is important. Contact a doctor if:  You feel sicker to your stomach.  You feel sick to your stomach for more than 2 days.  You throw up.  You are not able to drink fluids without throwing up.  You have new symptoms.  You have a fever.  You have a headache.  You have muscle cramps.  You have a rash.  You have pain while peeing.  You feel light-headed or dizzy. Get help right away if:  You have pain in your chest, neck, arm, or jaw.  You feel very weak or you pass out (faint).  You have throw up that is bright red or looks like coffee grounds.  You have bloody or black poop (stools) or poop that looks like tar.  You have a very bad headache, a stiff neck, or both.  You have very bad pain, cramping, or bloating in your belly (abdomen).  You have trouble breathing or you are breathing very quickly.  Your heart is beating very quickly.  Your skin feels  cold and clammy.  You feel confused.  You have signs of losing too much water in your body, such as: ? Dark pee, very little pee, or no pee. ? Cracked lips. ? Dry mouth. ? Sunken eyes. ? Sleepiness. ? Weakness. These symptoms may be an emergency. Do not wait to see if the symptoms will go away. Get medical help right away. Call your local emergency services (911 in the [place] Do not drive yourself to the hospital. Summary  Nausea is feeling sick to your stomach or feeling that you are about to throw up (vomit).  If you throw up, or if you are not able to drink enough fluids, there is a risk that you may lose too much water in your body (get dehydrated).  Eat and drink what your doctor tells you. Take over-the-counter and prescription medicines only as told by your doctor.  Contact a doctor right away if your symptoms get worse or you have new symptoms.  Keep all follow-up visits as told by your doctor. This is important. This information is not intended to replace advice given to you by your health care  provider. Make sure you discuss any questions you have with your health care provider. Document Released: 02/12/2011 Document Revised: 08/03/2017 Document Reviewed: 08/03/2017 Elsevier Patient Education  2020 [employer].

## 2018-10-21 LAB — CBC WITH DIFFERENTIAL/PLATELET
Absolute Monocytes: 554 cells/uL (ref 200–950)
Basophils Absolute: 40 {cells}/uL (ref 0–200)
Basophils Relative: 0.6 %
Eosinophils Absolute: 257 cells/uL (ref 15–500)
Eosinophils Relative: 3.9 %
HCT: 39.6 % (ref 35.0–45.0)
Hemoglobin: 12.4 g/dL (ref 11.7–15.5)
Lymphs Abs: 1551 cells/uL (ref 850–3900)
MCH: 27.4 pg (ref 27.0–33.0)
MCHC: 31.3 g/dL — ABNORMAL LOW (ref 32.0–36.0)
MCV: 87.6 fL (ref 80.0–100.0)
MPV: 11.6 fL (ref 7.5–12.5)
Monocytes Relative: 8.4 %
Neutro Abs: 4198 cells/uL (ref 1500–7800)
Neutrophils Relative %: 63.6 %
Platelets: 251 10*3/uL (ref 140–400)
RBC: 4.52 10*6/uL (ref 3.80–5.10)
RDW: 14.3 % (ref 11.0–15.0)
Total Lymphocyte: 23.5 %
WBC: 6.6 10*3/uL (ref 3.8–10.8)

## 2018-10-21 LAB — LIPASE: Lipase: 11 U/L (ref 7–60)

## 2018-10-21 LAB — COMPLETE METABOLIC PANEL WITH GFR
AG Ratio: 1.5 (calc) (ref 1.0–2.5)
ALT: 10 U/L (ref 6–29)
AST: 16 U/L (ref 10–35)
Albumin: 3.8 g/dL (ref 3.6–5.1)
Alkaline phosphatase (APISO): 152 U/L (ref 37–153)
BUN: 10 mg/dL (ref 7–25)
CO2: 29 mmol/L (ref 20–32)
Calcium: 9.6 mg/dL (ref 8.6–10.4)
Chloride: 103 mmol/L (ref 98–110)
Creat: 0.7 mg/dL (ref 0.50–0.99)
GFR, Est African American: 105 mL/min/{1.73_m2} (ref >=60)
GFR, Est Non African American: 91 mL/min/{1.73_m2} (ref >=60)
Globulin: 2.5 g/dL (calc) (ref 1.9–3.7)
Glucose, Bld: 108 mg/dL — ABNORMAL HIGH (ref 65–99)
Potassium: 3.9 mmol/L (ref 3.5–5.3)
Sodium: 141 mmol/L (ref 135–146)
Total Bilirubin: 0.4 mg/dL (ref 0.2–1.2)
Total Protein: 6.3 g/dL (ref 6.1–8.1)

## 2018-10-21 LAB — NOVEL CORONAVIRUS, NAA: SARS-CoV-2, NAA: NOT DETECTED

## 2018-10-21 LAB — AMYLASE: Amylase: 12 U/L — ABNORMAL LOW (ref 21–101)

## 2018-10-24 ENCOUNTER — Encounter: Payer: Self-pay | Admitting: Internal Medicine

## 2018-10-28 ENCOUNTER — Other Ambulatory Visit: Payer: Self-pay | Admitting: Gastroenterology

## 2018-10-28 ENCOUNTER — Other Ambulatory Visit: Payer: Self-pay

## 2018-10-28 ENCOUNTER — Ambulatory Visit
Admission: RE | Admit: 2018-10-28 | Discharge: 2018-10-28 | Disposition: A | Discharge status: Home / Self Care | Payer: Medicare Other | Source: Ambulatory Visit | Attending: Gastroenterology | Admitting: Gastroenterology

## 2018-10-28 DIAGNOSIS — R634 Abnormal weight loss: Secondary | ICD-10-CM

## 2018-10-28 DIAGNOSIS — R110 Nausea: Secondary | ICD-10-CM

## 2018-10-28 DIAGNOSIS — R1011 Right upper quadrant pain: Secondary | ICD-10-CM

## 2018-10-28 MED ORDER — IOPAMIDOL (ISOVUE-300) INJECTION 61%
100.0000 mL | Freq: Once | INTRAVENOUS | Status: AC | PRN
  Administered 2018-10-28: 100 mL via INTRAVENOUS

## 2018-10-29 ENCOUNTER — Other Ambulatory Visit: Payer: Self-pay | Admitting: Internal Medicine

## 2018-10-29 MED ORDER — MELOXICAM 15 MG PO TABS: ORAL_TABLET | ORAL | 3 refills | Status: DC

## 2018-10-31 ENCOUNTER — Other Ambulatory Visit (HOSPITAL_COMMUNITY): Payer: Self-pay | Admitting: Gastroenterology

## 2018-10-31 ENCOUNTER — Other Ambulatory Visit: Payer: Self-pay | Admitting: Gastroenterology

## 2018-10-31 DIAGNOSIS — R1011 Right upper quadrant pain: Secondary | ICD-10-CM

## 2018-11-04 ENCOUNTER — Other Ambulatory Visit: Payer: Self-pay | Admitting: Internal Medicine

## 2018-11-04 ENCOUNTER — Other Ambulatory Visit: Payer: Medicare Other

## 2018-11-16 ENCOUNTER — Encounter (HOSPITAL_COMMUNITY)
Admission: RE | Admit: 2018-11-16 | Discharge: 2018-11-16 | Disposition: A | Discharge status: Home / Self Care | Payer: Medicare Other | Source: Ambulatory Visit | Attending: Gastroenterology | Admitting: Gastroenterology

## 2018-11-16 ENCOUNTER — Other Ambulatory Visit: Payer: Self-pay

## 2018-11-16 ENCOUNTER — Other Ambulatory Visit (HOSPITAL_COMMUNITY): Payer: Medicare Other

## 2018-11-16 ENCOUNTER — Encounter (HOSPITAL_COMMUNITY): Payer: Self-pay

## 2018-11-16 DIAGNOSIS — R1011 Right upper quadrant pain: Secondary | ICD-10-CM | POA: Diagnosis not present

## 2018-11-22 ENCOUNTER — Other Ambulatory Visit: Payer: Self-pay | Admitting: Surgery

## 2018-11-23 ENCOUNTER — Ambulatory Visit: Payer: Self-pay | Admitting: Adult Health

## 2018-11-24 ENCOUNTER — Encounter: Payer: Self-pay | Admitting: Adult Health

## 2018-12-01 ENCOUNTER — Encounter (HOSPITAL_COMMUNITY)
Admission: RE | Admit: 2018-12-01 | Discharge: 2018-12-01 | Disposition: A | Discharge status: Home / Self Care | Payer: Medicare Other | Source: Ambulatory Visit | Attending: Surgery | Admitting: Surgery

## 2018-12-01 ENCOUNTER — Other Ambulatory Visit: Payer: Self-pay

## 2018-12-01 ENCOUNTER — Encounter (HOSPITAL_COMMUNITY): Payer: Self-pay

## 2018-12-01 DIAGNOSIS — R9431 Abnormal electrocardiogram [ECG] [EKG]: Secondary | ICD-10-CM | POA: Insufficient documentation

## 2018-12-01 DIAGNOSIS — E119 Type 2 diabetes mellitus without complications: Secondary | ICD-10-CM | POA: Insufficient documentation

## 2018-12-01 DIAGNOSIS — K828 Other specified diseases of gallbladder: Secondary | ICD-10-CM | POA: Diagnosis not present

## 2018-12-01 DIAGNOSIS — Z01812 Encounter for preprocedural laboratory examination: Secondary | ICD-10-CM | POA: Diagnosis present

## 2018-12-01 HISTORY — DX: Other complications of anesthesia, initial encounter: T88.59XA

## 2018-12-01 HISTORY — DX: Unspecified osteoarthritis, unspecified site: M19.90

## 2018-12-01 LAB — CBC
HCT: 40.7 % (ref 36.0–46.0)
Hemoglobin: 12.9 g/dL (ref 12.0–15.0)
MCH: 27.3 pg (ref 26.0–34.0)
MCHC: 31.7 g/dL (ref 30.0–36.0)
MCV: 86 fL (ref 80.0–100.0)
Platelets: 212 10*3/uL (ref 150–400)
RBC: 4.73 MIL/uL (ref 3.87–5.11)
RDW: 15.7 % — ABNORMAL HIGH (ref 11.5–15.5)
WBC: 6.3 10*3/uL (ref 4.0–10.5)
nRBC: 0 % (ref 0.0–0.2)

## 2018-12-01 LAB — BASIC METABOLIC PANEL
Anion gap: 11 (ref 5–15)
BUN: 5 mg/dL — ABNORMAL LOW (ref 8–23)
CO2: 27 mmol/L (ref 22–32)
Calcium: 8.9 mg/dL (ref 8.9–10.3)
Chloride: 102 mmol/L (ref 98–111)
Creatinine, Ser: 0.79 mg/dL (ref 0.44–1.00)
GFR calc Af Amer: >60 mL/min (ref >60)
GFR calc non Af Amer: >60 mL/min (ref >60)
Glucose, Bld: 90 mg/dL (ref 70–99)
Potassium: 3.4 mmol/L — ABNORMAL LOW (ref 3.5–5.1)
Sodium: 140 mmol/L (ref 135–145)

## 2018-12-01 LAB — HEMOGLOBIN A1C
Hgb A1c MFr Bld: 5.4 % (ref 4.8–5.6)
Mean Plasma Glucose: 108.28 mg/dL

## 2018-12-01 LAB — GLUCOSE, CAPILLARY: Glucose-Capillary: 89 mg/dL (ref 70–99)

## 2018-12-01 NOTE — Progress Notes (Signed)
PCP - [name], MD Cardiologist - N/A PPM/ICD - N/A Device Orders - N/A Rep Notified N/A  Chest x-ray - N/A EKG - 12/01/2018 Stress Test - N/A ECHO - N/A Cardiac Cath - N/A  Sleep Study - Yes CPAP - Per patient, has not used in years-- couldn't find a mask that fit  Fasting Blood Sugar  60-90 Checks Blood Sugar 1 time a day, in the morning  Blood Thinner Instructions: N/A Aspirin Instructions: N/A  ERAS Protcol - Yes PRE-SURGERY Ensure - N/A  COVID TEST- 12/05/2018   Anesthesia review: N/A  Patient denies shortness of breath, fever, cough and chest pain at PAT appointment   Coronavirus Screening  Have you experienced the following symptoms:  Cough yes/no: No Fever (>100.55F)  yes/no: No Runny nose yes/no: No Sore throat yes/no: No Difficulty breathing/shortness of breath  yes/no: No  Have you or a family member traveled in the last 14 days and where? yes/no: No   If the patient indicates "YES" to the above questions, their PAT will be rescheduled to limit the exposure to others and, the surgeon will be notified. THE PATIENT WILL NEED TO BE ASYMPTOMATIC FOR 14 DAYS.   If the patient is not experiencing any of these symptoms, the PAT nurse will instruct them to NOT bring anyone with them to their appointment since they may have these symptoms or traveled as well.   Please remind your patients and families that hospital visitation restrictions are in effect and the importance of the restrictions.     Patient verbalized understanding of instructions that were given to them at the PAT appointment. Patient was also instructed that they will need to review over the PAT instructions again at home before surgery.

## 2018-12-01 NOTE — Progress Notes (Signed)
[facility] [place], [address] AT [address] [place] [address] Phone: [phone] Fax: [phone]      Your procedure is scheduled on December 08, 2018.  Report to [facility] Main Entrance "A" at 11:20 A.M., and check in at the Admitting office.  Call this number if you have problems the morning of surgery:  [phone]  Call [phone] if you have any questions prior to your surgery date Monday-Friday 8am-4pm    Remember:  Do not eat after midnight the night before your surgery  You may drink clear liquids until 10:20am the morning of your surgery.   Clear liquids allowed are: Water, Non-Citrus Juices (without pulp), Carbonated Beverages, Clear Tea, Black Coffee Only, and Gatorade    Take these medicines the morning of surgery with A SIP OF WATER: Albuterol (Ventolin) inhaler if needed Alprazolam (Xanax) Metoclopramide (Reglan) if needed Pantoprazole (Protonix) Venlafaxine XR (Effexor-XR) Promethazine (Phenergan) if needed  7 days prior to surgery STOP taking any Aspirin (unless otherwise instructed by your surgeon), Mobic/Meloxicam, Aleve, Naproxen, Ibuprofen, Motrin, Advil, Goody's, BC's, all herbal medications, fish oil, and all vitamins.   WHAT DO I DO ABOUT MY DIABETES MEDICATION?   Marland Kitchen Do not take oral diabetes medicines (pills) the morning of surgery. DO NOT TAKE YOUR Metformin (Glucophage-XR) the morning of your surgery.    How to Manage Your Diabetes Before and After Surgery  Why is it important to control my blood sugar before and after surgery? . Improving blood sugar levels before and after surgery helps healing and can limit problems. . A way of improving blood sugar control is eating a healthy diet by: o  Eating less sugar and carbohydrates o  Increasing activity/exercise o  Talking with your doctor about reaching your blood sugar goals . High blood sugars (greater than 180  mg/dL) can raise your risk of infections and slow your recovery, so you will need to focus on controlling your diabetes during the weeks before surgery. . Make sure that the doctor who takes care of your diabetes knows about your planned surgery including the date and location.  How do I manage my blood sugar before surgery? . Check your blood sugar at least 4 times a day, starting 2 days before surgery, to make sure that the level is not too high or low. o Check your blood sugar the morning of your surgery when you wake up and every 2 hours until you get to the Short Stay unit. . If your blood sugar is less than 70 mg/dL, you will need to treat for low blood sugar: o Do not take insulin. o Treat a low blood sugar (less than 70 mg/dL) with  cup of clear juice (cranberry or apple), 4 glucose tablets, OR glucose gel. o Recheck blood sugar in 15 minutes after treatment (to make sure it is greater than 70 mg/dL). If your blood sugar is not greater than 70 mg/dL on recheck, call [phone] for further instructions. . Report your blood sugar to the short stay nurse when you get to Short Stay.  . If you are admitted to the hospital after surgery: o Your blood sugar will be checked by the staff and you will probably be given insulin after surgery (instead of oral diabetes medicines) to make sure you have good blood sugar levels. o The goal for blood sugar control after surgery is 80-180 mg/dL.    The Morning  of Surgery  Do not wear jewelry, make-up or nail polish.  Do not wear lotions, powders, or perfumes/colognes, or deodorant  Do not shave 48 hours prior to surgery.  Do not bring valuables to the hospital.  [facility] is not responsible for any belongings or valuables.  If you are a smoker, DO NOT Smoke 24 hours prior to surgery IF you wear a CPAP at night please bring your mask, tubing, and machine the morning of surgery   Remember that you must have someone to transport you home after  your surgery, and remain with you for 24 hours if you are discharged the same day.   Contacts, glasses, hearing aids, dentures or bridgework may not be worn into surgery.    Leave your suitcase in the car.  After surgery it may be brought to your room.  For patients admitted to the hospital, discharge time will be determined by your treatment team.  Patients discharged the day of surgery will not be allowed to drive home.    Special instructions:   [facility]- Preparing For Surgery  Before surgery, you can play an important role. Because skin is not sterile, your skin needs to be as free of germs as possible. You can reduce the number of germs on your skin by washing with CHG (chlorahexidine gluconate) Soap before surgery.  CHG is an antiseptic cleaner which kills germs and bonds with the skin to continue killing germs even after washing.    Oral Hygiene is also important to reduce your risk of infection.  Remember - BRUSH YOUR TEETH THE MORNING OF SURGERY WITH YOUR REGULAR TOOTHPASTE  Please do not use if you have an allergy to CHG or antibacterial soaps. If your skin becomes reddened/irritated stop using the CHG.  Do not shave (including legs and underarms) for at least 48 hours prior to first CHG shower. It is OK to shave your face.  Please follow these instructions carefully.   1. Shower the NIGHT BEFORE SURGERY and the MORNING OF SURGERY with CHG Soap.   2. If you chose to wash your hair, wash your hair first as usual with your normal shampoo.  3. After you shampoo, rinse your hair and body thoroughly to remove the shampoo.  4. Use CHG as you would any other liquid soap. You can apply CHG directly to the skin and wash gently with a scrungie or a clean washcloth.   5. Apply the CHG Soap to your body ONLY FROM THE NECK DOWN.  Do not use on open wounds or open sores. Avoid contact with your eyes, ears, mouth and genitals (private parts). Wash Face and genitals (private parts)   with your normal soap.   6. Wash thoroughly, paying special attention to the area where your surgery will be performed.  7. Thoroughly rinse your body with warm water from the neck down.  8. DO NOT shower/wash with your normal soap after using and rinsing off the CHG Soap.  9. Pat yourself dry with a CLEAN TOWEL.  10. Wear CLEAN PAJAMAS to bed the night before surgery, wear comfortable clothes the morning of surgery  11. Place CLEAN SHEETS on your bed the night of your first shower and DO NOT SLEEP WITH PETS.    Day of Surgery:  Do not apply any deodorants/lotions. Please shower the morning of surgery with the CHG soap  Please wear clean clothes to the hospital/surgery center.   Remember to brush your teeth WITH YOUR REGULAR TOOTHPASTE.  Please read over the following fact sheets that you were given.

## 2018-12-02 NOTE — Progress Notes (Signed)
Anesthesia Chart Review:   Case: Beth [name] Date/Time: 12/08/18 1305   Procedure: LAPAROSCOPIC CHOLECYSTECTOMY (N/A )   Anesthesia type: General   Pre-op diagnosis: BILIARY DYSKENISIS   Location: [facility] OR ROOM 02 / [facility]   Surgeon: Coralie Keens, MD      DISCUSSION:  - Pt is a 68 year old female with hx HTN, DM, COPD, OSA (does not use CPAP). Current smoker   VS: BP (!) 157/86   Pulse 87   Temp 36.4 C (Skin)   Resp 16   Ht 5\' 5"  (1.651 m)   Wt 100.9 kg   LMP 04/09/2009   BMI 37.01 kg/m    PROVIDERS: - PCP is [name], MD   LABS: Labs reviewed: Acceptable for surgery. (all labs ordered are listed, but only abnormal results are displayed)  Labs Reviewed  CBC - Abnormal; Notable for the following components:      Result Value   RDW 15.7 (*)    All other components within normal limits  BASIC METABOLIC PANEL - Abnormal; Notable for the following components:   Potassium 3.4 (*)    BUN 5 (*)    All other components within normal limits  GLUCOSE, CAPILLARY  HEMOGLOBIN A1C     IMAGES: N/A   EKG 12/01/18: NSR. Cannot rule out Anterior infarct, age undetermined. Appears stable when compared to EKG 06/17/10 per interpreting cardiologist Mertie Moores, MD  CV: None   Past Medical History:  Diagnosis Date  . Anemia   . Anxiety   . Arthritis   . Bell's palsy 01/1985  . Complication of anesthesia   . COPD (chronic obstructive pulmonary disease) ([place])   . Depression   . Diabetes mellitus without complication ([place])   . GERD (gastroesophageal reflux disease)   . Hypertension   . IBS (irritable bowel syndrome) 12/89  . Obesity, Class III, BMI 40-49.9 (morbid obesity) ([place])   . OSA (obstructive sleep apnea)    no cpap  . Vitamin D deficiency     Past Surgical History:  Procedure Laterality Date  . BLADDER SUSPENSION  03/2006, 07/2006, 01/2007   for stress incontinence  . COLONOSCOPY  05/2005   Normal, recheck 10 yrs  . JOINT REPLACEMENT     x3 lt knee   . LUMBAR FUSION Right 09/2016   L4-5 fusion by Dr. Sherley Bounds  . TONSILLECTOMY AND ADENOIDECTOMY    . TOTAL KNEE ARTHROPLASTY Left 2012   x 2 left knee  . TOTAL KNEE REVISION  2014    MEDICATIONS: . acetaminophen (TYLENOL) 500 MG tablet  . albuterol (VENTOLIN HFA) 108 (90 Base) MCG/ACT inhaler  . ALPRAZolam (XANAX) 0.25 MG tablet  . aspirin EC 81 MG tablet  . Cholecalciferol (VITAMIN D3) 125 MCG (5000 UT) CAPS  . docusate sodium (COLACE) 100 MG capsule  . famotidine (PEPCID) 10 MG tablet  . hyoscyamine (LEVBID) 0.375 MG 12 hr tablet  . meloxicam (MOBIC) 15 MG tablet  . metFORMIN (GLUCOPHAGE-XR) 500 MG 24 hr tablet  . metoCLOPramide (REGLAN) 10 MG tablet  . montelukast (SINGULAIR) 10 MG tablet  . MYRBETRIQ 50 MG TB24 tablet  . olmesartan-hydrochlorothiazide (BENICAR HCT) 40-12.5 MG tablet  . ONETOUCH DELICA LANCETS [id number] MISC  . ONETOUCH VERIO test strip  . pantoprazole (PROTONIX) 40 MG tablet  . polyethylene glycol powder (GLYCOLAX/MIRALAX) 17 GM/SCOOP powder  . promethazine (PHENERGAN) 25 MG tablet  . rosuvastatin (CRESTOR) 10 MG tablet  . venlafaxine XR (EFFEXOR-XR) 150 MG 24 hr capsule  . zaleplon (SONATA)  10 MG capsule   No current facility-administered medications for this encounter.     If no changes, I anticipate pt can proceed with surgery as scheduled.   Willeen Cass, FNP-BC [facility] Short Stay [facility] Center/Anesthesiology Phone: [phone] 12/02/2018 11:26 AM

## 2018-12-02 NOTE — Anesthesia Preprocedure Evaluation (Addendum)
Anesthesia Evaluation  Patient identified by MRN, date of birth, ID band Patient awake    Reviewed: Allergy & Precautions, NPO status , Patient's Chart, lab work & pertinent test results  History of Anesthesia Complications Negative for: history of anesthetic complications  Airway Mallampati: II  TM Distance: >3 FB Neck ROM: Full    Dental  (+) Dental Advisory Given   Pulmonary sleep apnea , COPD, Current Smoker and Patient abstained from smoking.,    breath sounds clear to auscultation       Cardiovascular hypertension, Pt. on medications (-) angina(-) Past MI and (-) CHF  Rhythm:Regular     Neuro/Psych PSYCHIATRIC DISORDERS Anxiety Depression  Neuromuscular disease    GI/Hepatic GERD  Medicated and Controlled,BILIARY DYSKENISIS   Endo/Other  diabetesMorbid obesity  Renal/GU Renal disease     Musculoskeletal  (+) Arthritis ,   Abdominal   Peds  Hematology negative hematology ROS (+)   Anesthesia Other Findings   Reproductive/Obstetrics                           Anesthesia Physical Anesthesia Plan  ASA: III  Anesthesia Plan: General   Post-op Pain Management:    Induction: Intravenous  PONV Risk Score and Plan: 2 and Ondansetron and Dexamethasone  Airway Management Planned: Oral ETT  Additional Equipment: None  Intra-op Plan:   Post-operative Plan: Extubation in OR  Informed Consent: I have reviewed the patients History and Physical, chart, labs and discussed the procedure including the risks, benefits and alternatives for the proposed anesthesia with the patient or authorized representative who has indicated his/her understanding and acceptance.     Dental advisory given  Plan Discussed with: CRNA and Surgeon  Anesthesia Plan Comments: (See APP note by Durel Salts, NP)       Anesthesia Quick Evaluation

## 2018-12-05 ENCOUNTER — Other Ambulatory Visit (HOSPITAL_COMMUNITY)
Admission: RE | Admit: 2018-12-05 | Discharge: 2018-12-05 | Disposition: A | Discharge status: Home / Self Care | Payer: Medicare Other | Source: Ambulatory Visit | Attending: Surgery | Admitting: Surgery

## 2018-12-05 DIAGNOSIS — Z20828 Contact with and (suspected) exposure to other viral communicable diseases: Secondary | ICD-10-CM | POA: Insufficient documentation

## 2018-12-05 DIAGNOSIS — Z01812 Encounter for preprocedural laboratory examination: Secondary | ICD-10-CM | POA: Insufficient documentation

## 2018-12-06 LAB — NOVEL CORONAVIRUS, NAA (HOSP ORDER, SEND-OUT TO REF LAB; TAT 18-24 HRS): SARS-CoV-2, NAA: NOT DETECTED

## 2018-12-07 MED ORDER — VANCOMYCIN HCL 10 G IV SOLR
1500.0000 mg | INTRAVENOUS | Status: AC
  Administered 2018-12-08 (×2): 1500 mg via INTRAVENOUS
  Filled 2018-12-07: qty 1500

## 2018-12-07 NOTE — H&P (Signed)
Altamese Cabal Documented: 11/22/2018 3:05 PM Location: [facility] Patient #: [id number] DOB: 1950-06-06 Divorced / Language: [place] / Race: White Female   History of Present Illness (Artrice Kraker A. Magnus Ivan MD; 11/22/2018 3:23 PM) The patient is a 68 year old female who presents with nausea. This patient is referred by Dr. Charna Elizabeth for evaluation of nausea and biliary dyskinesia. She has been having significant nausea for several months now. It is worse after fatty meals. This is occurred throughout several years especially after surgery. She recently again had surgery on her knee. She is nauseated daily and has been losing significant weight. She had a normal ultrasound of the gallbladder. She had a normal CAT scan of the abdomen and pelvis. She then had a HIDA scan showing an 8 percent gallbladder ejection fraction. Bowel movements are normal. She has no pain.   Past Surgical History Santiago Glad, [place]; 11/22/2018 3:06 PM) Colon Polyp Removal - Colonoscopy  Knee Surgery  Left. Spinal Surgery - Lower Back   Diagnostic Studies History Santiago Glad, [place]; 11/22/2018 3:06 PM) Colonoscopy  1-5 years ago Mammogram  within last year Pap Smear  1-5 years ago  Allergies Santiago Glad, CMA; 11/22/2018 3:06 PM) Penicillins  Allergies Reconciled   Medication History Santiago Glad, CMA; 11/22/2018 3:08 PM) Venlafaxine HCl ER (150MG  Capsule ER 24HR, Oral) Active. Promethazine HCl (25MG  Tablet, Oral) Active. Pantoprazole Sodium (40MG  Tablet DR, Oral) Active. ALPRAZolam (0.25MG  Tablet, Oral) Active. Reglan (5MG  Tablet, Oral) Active. Colace (50MG  Capsule, Oral) Active. Pepcid (20MG  Tablet, Oral) Active. Medications Reconciled  Social History Santiago Glad, [place]; 11/22/2018 3:06 PM) Alcohol use  Occasional alcohol use. Caffeine use  Coffee. No drug use  Tobacco use  Current some day smoker.  Family History Santiago Glad, [place]; 11/22/2018  3:06 PM) Arthritis  Mother, Sister. Cerebrovascular Accident  Father. Depression  Mother. Diabetes Mellitus  Father. Heart Disease  Father. Hypertension  Father. Thyroid problems  Mother.  Pregnancy / Birth History Santiago Glad, [place]; 11/22/2018 3:06 PM) Age at menarche  14 years. Age of menopause  62-55 Gravida  2 Maternal age  69-20 Para  1  Other Problems Santiago Glad, [place]; 11/22/2018 3:06 PM) Anxiety Disorder  Arthritis  Back Pain  Bladder Problems  Depression  Diabetes Mellitus  Gastroesophageal Reflux Disease  High blood pressure  Hypercholesterolemia     Review of Systems Santiago Glad CMA; 11/22/2018 3:06 PM) General Present- Appetite Loss. Not Present- Chills, Fatigue, Fever, Night Sweats, Weight Gain and Weight Loss. Skin Not Present- Change in Wart/Mole, Dryness, Hives, Jaundice, New Lesions, Non-Healing Wounds, Rash and Ulcer. HEENT Present- Wears glasses/contact lenses. Not Present- Earache, Hearing Loss, Hoarseness, Nose Bleed, Oral Ulcers, Ringing in the Ears, Seasonal Allergies, Sinus Pain, Sore Throat, Visual Disturbances and Yellow Eyes. Respiratory Not Present- Bloody sputum, Chronic Cough, Difficulty Breathing, Snoring and Wheezing. Cardiovascular Not Present- Chest Pain, Difficulty Breathing Lying Down, Leg Cramps, Palpitations, Rapid Heart Rate, Shortness of Breath and Swelling of Extremities. Gastrointestinal Present- Change in Bowel Habits, Constipation, Indigestion and Nausea. Not Present- Abdominal Pain, Bloating, Bloody Stool, Chronic diarrhea, Difficulty Swallowing, Excessive gas, Gets full quickly at meals, Hemorrhoids, Rectal Pain and Vomiting. Female Genitourinary Present- Frequency. Not Present- Nocturia, Painful Urination, Pelvic Pain and Urgency. Musculoskeletal Present- Muscle Weakness. Not Present- Back Pain, Joint Pain, Joint Stiffness, Muscle Pain and Swelling of Extremities.  Vitals Santiago Glad CMA;  11/22/2018 3:06 PM) 11/22/2018 3:06 PM Weight: 224.2 lb Height: 66in Body Surface Area: 2.1 m Body Mass Index: 36.19 kg/m  Temp.:  98.73F  Pulse: 114 (Regular)  BP: 158/94(Sitting, Left Arm, Standard)       Physical Exam (Shayne Deerman A. Magnus Ivan MD; 11/22/2018 3:24 PM) General Mental Status-Alert. General Appearance-Consistent with stated age. Hydration-Well hydrated. Voice-Normal.  Head and Neck Head-normocephalic, atraumatic with no lesions or palpable masses.  Eye Eyeball - Bilateral-Extraocular movements intact. Sclera/Conjunctiva - Bilateral-No scleral icterus.  Chest and Lung Exam Chest and lung exam reveals -quiet, even and easy respiratory effort with no use of accessory muscles and on auscultation, normal breath sounds, no adventitious sounds and normal vocal resonance. Inspection Chest Wall - Normal. Back - normal.  Cardiovascular Cardiovascular examination reveals -on palpation PMI is normal in location and amplitude, no palpable S3 or S4. Normal cardiac borders., normal heart sounds, regular rate and rhythm with no murmurs, carotid auscultation reveals no bruits and normal pedal pulses bilaterally.  Abdomen Inspection Inspection of the abdomen reveals - No Hernias. Skin - Scar - no surgical scars. Palpation/Percussion Palpation and Percussion of the abdomen reveal - Soft, Non Tender, No Rebound tenderness, No Rigidity (guarding) and No hepatosplenomegaly. Auscultation Auscultation of the abdomen reveals - Bowel sounds normal.  Neurologic - Did not examine.  Musculoskeletal - Did not examine.    Assessment & Plan (Dagmar Adcox A. Magnus Ivan MD; 11/22/2018 3:24 PM)  BILIARY DYSKINESIA (K82.8) Impression: This is a patient with severe biliary dyskinesia and I suspect chronic cholecystitis. Given her weight loss, laparoscopic cholecystectomy is recommended and she is proceed with surgery. I gave her literature regarding surgery. We discussed  the risk of surgery in detail. These include but are not limited to bleeding, infection, injury to surrounding structures, need to convert to an open procedure, cardiopulmonary issues, the chances may not resolve her symptoms, postoperative recovery, etc. She understands and agrees to proceed with surgery

## 2018-12-08 ENCOUNTER — Ambulatory Visit (HOSPITAL_COMMUNITY): Payer: Medicare Other | Admitting: Emergency Medicine

## 2018-12-08 ENCOUNTER — Encounter (HOSPITAL_COMMUNITY): Payer: Self-pay

## 2018-12-08 ENCOUNTER — Observation Stay (HOSPITAL_COMMUNITY)
Admission: RE | Admit: 2018-12-08 | Discharge: 2018-12-09 | Disposition: A | Discharge status: Home / Self Care | Payer: Medicare Other | Attending: Surgery | Admitting: Surgery

## 2018-12-08 ENCOUNTER — Ambulatory Visit (HOSPITAL_COMMUNITY): Payer: Medicare Other | Admitting: Anesthesiology

## 2018-12-08 ENCOUNTER — Encounter (HOSPITAL_COMMUNITY)
Admission: RE | Disposition: A | Discharge status: Home / Self Care | Payer: Self-pay | Source: Home / Self Care | Attending: Surgery

## 2018-12-08 ENCOUNTER — Other Ambulatory Visit: Payer: Self-pay

## 2018-12-08 DIAGNOSIS — Z6837 Body mass index (BMI) 37.0-37.9, adult: Secondary | ICD-10-CM | POA: Diagnosis not present

## 2018-12-08 DIAGNOSIS — F17200 Nicotine dependence, unspecified, uncomplicated: Secondary | ICD-10-CM | POA: Diagnosis not present

## 2018-12-08 DIAGNOSIS — F419 Anxiety disorder, unspecified: Secondary | ICD-10-CM | POA: Diagnosis not present

## 2018-12-08 DIAGNOSIS — K219 Gastro-esophageal reflux disease without esophagitis: Secondary | ICD-10-CM | POA: Insufficient documentation

## 2018-12-08 DIAGNOSIS — K811 Chronic cholecystitis: Principal | ICD-10-CM | POA: Insufficient documentation

## 2018-12-08 DIAGNOSIS — J449 Chronic obstructive pulmonary disease, unspecified: Secondary | ICD-10-CM | POA: Insufficient documentation

## 2018-12-08 DIAGNOSIS — I10 Essential (primary) hypertension: Secondary | ICD-10-CM | POA: Insufficient documentation

## 2018-12-08 DIAGNOSIS — E119 Type 2 diabetes mellitus without complications: Secondary | ICD-10-CM | POA: Insufficient documentation

## 2018-12-08 DIAGNOSIS — Z9049 Acquired absence of other specified parts of digestive tract: Secondary | ICD-10-CM

## 2018-12-08 DIAGNOSIS — R110 Nausea: Secondary | ICD-10-CM | POA: Diagnosis present

## 2018-12-08 DIAGNOSIS — Z79899 Other long term (current) drug therapy: Secondary | ICD-10-CM | POA: Diagnosis not present

## 2018-12-08 DIAGNOSIS — F329 Major depressive disorder, single episode, unspecified: Secondary | ICD-10-CM | POA: Insufficient documentation

## 2018-12-08 HISTORY — DX: Periapical abscess without sinus: K04.7

## 2018-12-08 HISTORY — PX: CHOLECYSTECTOMY: SHX55

## 2018-12-08 LAB — GLUCOSE, CAPILLARY
Glucose-Capillary: 105 mg/dL — ABNORMAL HIGH (ref 70–99)
Glucose-Capillary: 117 mg/dL — ABNORMAL HIGH (ref 70–99)
Glucose-Capillary: 138 mg/dL — ABNORMAL HIGH (ref 70–99)
Glucose-Capillary: 148 mg/dL — ABNORMAL HIGH (ref 70–99)

## 2018-12-08 SURGERY — LAPAROSCOPIC CHOLECYSTECTOMY
Anesthesia: General | Site: Abdomen

## 2018-12-08 MED ORDER — POTASSIUM CHLORIDE IN NACL 20-0.9 MEQ/L-% IV SOLN
INTRAVENOUS | Status: DC
  Administered 2018-12-08: 15:00:00 via INTRAVENOUS
  Filled 2018-12-08 (×2): qty 1000

## 2018-12-08 MED ORDER — MORPHINE SULFATE (PF) 2 MG/ML IV SOLN: 1.0000 mg | INTRAVENOUS | Status: DC | PRN

## 2018-12-08 MED ORDER — ALBUTEROL SULFATE (2.5 MG/3ML) 0.083% IN NEBU: 2.5000 mg | INHALATION_SOLUTION | RESPIRATORY_TRACT | Status: DC | PRN

## 2018-12-08 MED ORDER — BUPIVACAINE HCL (PF) 0.25 % IJ SOLN
INTRAMUSCULAR | Status: AC
  Filled 2018-12-08: qty 30

## 2018-12-08 MED ORDER — BUPIVACAINE-EPINEPHRINE 0.25% -1:200000 IJ SOLN
INTRAMUSCULAR | Status: AC
  Filled 2018-12-08: qty 1

## 2018-12-08 MED ORDER — FENTANYL CITRATE (PF) 100 MCG/2ML IJ SOLN
INTRAMUSCULAR | Status: DC | PRN
  Administered 2018-12-08 (×3): 50 ug via INTRAVENOUS

## 2018-12-08 MED ORDER — ONDANSETRON 4 MG PO TBDP: 4.0000 mg | ORAL_TABLET | Freq: Four times a day (QID) | ORAL | Status: DC | PRN

## 2018-12-08 MED ORDER — OXYCODONE HCL 5 MG PO TABS: 5.0000 mg | ORAL_TABLET | ORAL | Status: DC | PRN

## 2018-12-08 MED ORDER — PROCHLORPERAZINE MALEATE 10 MG PO TABS
10.0000 mg | ORAL_TABLET | Freq: Four times a day (QID) | ORAL | Status: DC | PRN
  Filled 2018-12-08: qty 1

## 2018-12-08 MED ORDER — KETAMINE HCL 50 MG/ML IJ SOLN
INTRAMUSCULAR | Status: DC | PRN
  Administered 2018-12-08: 30 mg via INTRAMUSCULAR
  Administered 2018-12-08: 10 mg via INTRAMUSCULAR

## 2018-12-08 MED ORDER — 0.9 % SODIUM CHLORIDE (POUR BTL) OPTIME
TOPICAL | Status: DC | PRN
  Administered 2018-12-08: 1000 mL

## 2018-12-08 MED ORDER — OXYCODONE HCL 5 MG PO TABS: 5.0000 mg | ORAL_TABLET | Freq: Once | ORAL | Status: DC | PRN

## 2018-12-08 MED ORDER — MIDAZOLAM HCL 5 MG/5ML IJ SOLN
INTRAMUSCULAR | Status: DC | PRN
  Administered 2018-12-08: 2 mg via INTRAVENOUS

## 2018-12-08 MED ORDER — VENLAFAXINE HCL ER 75 MG PO CP24
150.0000 mg | ORAL_CAPSULE | Freq: Two times a day (BID) | ORAL | Status: DC
  Administered 2018-12-09: 10:00:00 150 mg via ORAL
  Filled 2018-12-08: qty 2

## 2018-12-08 MED ORDER — ACETAMINOPHEN 160 MG/5ML PO SOLN: 1000.0000 mg | Freq: Once | ORAL | Status: DC | PRN

## 2018-12-08 MED ORDER — ENOXAPARIN SODIUM 40 MG/0.4ML ~~LOC~~ SOLN
40.0000 mg | SUBCUTANEOUS | Status: DC
  Administered 2018-12-09: 40 mg via SUBCUTANEOUS
  Filled 2018-12-08: qty 0.4

## 2018-12-08 MED ORDER — HYDRALAZINE HCL 20 MG/ML IJ SOLN
INTRAMUSCULAR | Status: DC | PRN
  Administered 2018-12-08: 5 mg via INTRAVENOUS

## 2018-12-08 MED ORDER — ONDANSETRON HCL 4 MG/2ML IJ SOLN: 4.0000 mg | Freq: Four times a day (QID) | INTRAMUSCULAR | Status: DC | PRN

## 2018-12-08 MED ORDER — PROPOFOL 10 MG/ML IV BOLUS
INTRAVENOUS | Status: AC
  Filled 2018-12-08: qty 20

## 2018-12-08 MED ORDER — INSULIN ASPART 100 UNIT/ML ~~LOC~~ SOLN
0.0000 [IU] | Freq: Three times a day (TID) | SUBCUTANEOUS | Status: DC
  Administered 2018-12-08: 18:00:00 3 [IU] via SUBCUTANEOUS

## 2018-12-08 MED ORDER — ROCURONIUM BROMIDE 100 MG/10ML IV SOLN
INTRAVENOUS | Status: DC | PRN
  Administered 2018-12-08: 70 mg via INTRAVENOUS

## 2018-12-08 MED ORDER — ACETAMINOPHEN 500 MG PO TABS: 1000.0000 mg | ORAL_TABLET | Freq: Once | ORAL | Status: DC | PRN

## 2018-12-08 MED ORDER — ACETAMINOPHEN 500 MG PO TABS: 1000.0000 mg | ORAL_TABLET | Freq: Once | ORAL | Status: DC

## 2018-12-08 MED ORDER — ROCURONIUM BROMIDE 10 MG/ML (PF) SYRINGE
PREFILLED_SYRINGE | INTRAVENOUS | Status: AC
  Filled 2018-12-08: qty 10

## 2018-12-08 MED ORDER — SODIUM CHLORIDE 0.9 % IR SOLN
Status: DC | PRN
  Administered 2018-12-08: 1000 mL

## 2018-12-08 MED ORDER — ALPRAZOLAM 0.25 MG PO TABS
0.2500 mg | ORAL_TABLET | Freq: Two times a day (BID) | ORAL | Status: DC
  Administered 2018-12-08 – 2018-12-09 (×2): 0.25 mg via ORAL
  Filled 2018-12-08 (×2): qty 1

## 2018-12-08 MED ORDER — ACETAMINOPHEN 500 MG PO TABS
1000.0000 mg | ORAL_TABLET | ORAL | Status: AC
  Administered 2018-12-08: 10:00:00 1000 mg via ORAL

## 2018-12-08 MED ORDER — GABAPENTIN 300 MG PO CAPS
ORAL_CAPSULE | ORAL | Status: AC
  Administered 2018-12-08: 300 mg via ORAL
  Filled 2018-12-08: qty 1

## 2018-12-08 MED ORDER — CHLORHEXIDINE GLUCONATE CLOTH 2 % EX PADS: 6.0000 | MEDICATED_PAD | Freq: Once | CUTANEOUS | Status: DC

## 2018-12-08 MED ORDER — MIDAZOLAM HCL 2 MG/2ML IJ SOLN
INTRAMUSCULAR | Status: AC
  Filled 2018-12-08: qty 2

## 2018-12-08 MED ORDER — KETAMINE HCL 50 MG/5ML IJ SOSY
PREFILLED_SYRINGE | INTRAMUSCULAR | Status: AC
  Filled 2018-12-08: qty 5

## 2018-12-08 MED ORDER — DIPHENHYDRAMINE HCL 12.5 MG/5ML PO ELIX: 12.5000 mg | ORAL_SOLUTION | Freq: Four times a day (QID) | ORAL | Status: DC | PRN

## 2018-12-08 MED ORDER — LIDOCAINE HCL (CARDIAC) PF 100 MG/5ML IV SOSY
PREFILLED_SYRINGE | INTRAVENOUS | Status: DC | PRN
  Administered 2018-12-08: 60 mg via INTRATRACHEAL

## 2018-12-08 MED ORDER — TRAMADOL HCL 50 MG PO TABS
50.0000 mg | ORAL_TABLET | Freq: Four times a day (QID) | ORAL | Status: DC | PRN
  Administered 2018-12-08: 50 mg via ORAL
  Filled 2018-12-08: qty 1

## 2018-12-08 MED ORDER — SCOPOLAMINE 1 MG/3DAYS TD PT72
MEDICATED_PATCH | TRANSDERMAL | Status: AC
  Filled 2018-12-08: qty 1

## 2018-12-08 MED ORDER — DIPHENHYDRAMINE HCL 50 MG/ML IJ SOLN: 12.5000 mg | Freq: Four times a day (QID) | INTRAMUSCULAR | Status: DC | PRN

## 2018-12-08 MED ORDER — METOPROLOL TARTRATE 5 MG/5ML IV SOLN
INTRAVENOUS | Status: DC | PRN
  Administered 2018-12-08: 2.5 mg via INTRAVENOUS

## 2018-12-08 MED ORDER — BUPIVACAINE-EPINEPHRINE 0.25% -1:200000 IJ SOLN
INTRAMUSCULAR | Status: DC | PRN
  Administered 2018-12-08: 20 mL

## 2018-12-08 MED ORDER — SUGAMMADEX SODIUM 200 MG/2ML IV SOLN
INTRAVENOUS | Status: DC | PRN
  Administered 2018-12-08: 203.2 mg via INTRAVENOUS

## 2018-12-08 MED ORDER — FENTANYL CITRATE (PF) 250 MCG/5ML IJ SOLN
INTRAMUSCULAR | Status: AC
  Filled 2018-12-08: qty 5

## 2018-12-08 MED ORDER — GABAPENTIN 300 MG PO CAPS
300.0000 mg | ORAL_CAPSULE | Freq: Two times a day (BID) | ORAL | Status: DC
  Administered 2018-12-08 – 2018-12-09 (×3): 300 mg via ORAL
  Filled 2018-12-08 (×3): qty 1

## 2018-12-08 MED ORDER — DEXAMETHASONE SODIUM PHOSPHATE 10 MG/ML IJ SOLN
INTRAMUSCULAR | Status: DC | PRN
  Administered 2018-12-08: 10 mg via INTRAVENOUS

## 2018-12-08 MED ORDER — PROPOFOL 10 MG/ML IV BOLUS
INTRAVENOUS | Status: DC | PRN
  Administered 2018-12-08: 130 mg via INTRAVENOUS

## 2018-12-08 MED ORDER — PROPOFOL 500 MG/50ML IV EMUL
INTRAVENOUS | Status: DC | PRN
  Administered 2018-12-08: 175 ug/kg/min via INTRAVENOUS

## 2018-12-08 MED ORDER — LIDOCAINE 2% (20 MG/ML) 5 ML SYRINGE
INTRAMUSCULAR | Status: AC
  Filled 2018-12-08: qty 5

## 2018-12-08 MED ORDER — OXYCODONE HCL 5 MG/5ML PO SOLN: 5.0000 mg | Freq: Once | ORAL | Status: DC | PRN

## 2018-12-08 MED ORDER — FENTANYL CITRATE (PF) 100 MCG/2ML IJ SOLN: 25.0000 ug | INTRAMUSCULAR | Status: DC | PRN

## 2018-12-08 MED ORDER — ONDANSETRON HCL 4 MG/2ML IJ SOLN
INTRAMUSCULAR | Status: DC | PRN
  Administered 2018-12-08: 4 mg via INTRAVENOUS

## 2018-12-08 MED ORDER — GABAPENTIN 300 MG PO CAPS
300.0000 mg | ORAL_CAPSULE | ORAL | Status: AC
  Administered 2018-12-08: 10:00:00 300 mg via ORAL

## 2018-12-08 MED ORDER — ACETAMINOPHEN 10 MG/ML IV SOLN: 1000.0000 mg | Freq: Once | INTRAVENOUS | Status: DC | PRN

## 2018-12-08 MED ORDER — PROPOFOL 1000 MG/100ML IV EMUL
INTRAVENOUS | Status: AC
  Filled 2018-12-08: qty 100

## 2018-12-08 MED ORDER — PROCHLORPERAZINE EDISYLATE 10 MG/2ML IJ SOLN: 5.0000 mg | Freq: Four times a day (QID) | INTRAMUSCULAR | Status: DC | PRN

## 2018-12-08 MED ORDER — LACTATED RINGERS IV SOLN
INTRAVENOUS | Status: DC
  Administered 2018-12-08: 10:00:00 via INTRAVENOUS

## 2018-12-08 MED ORDER — ACETAMINOPHEN 500 MG PO TABS
ORAL_TABLET | ORAL | Status: AC
  Administered 2018-12-08: 1000 mg via ORAL
  Filled 2018-12-08: qty 2

## 2018-12-08 SURGICAL SUPPLY — 37 items

## 2018-12-08 NOTE — Transfer of Care (Signed)
Immediate Anesthesia Transfer of Care Note  Patient: Beth [name]  Procedure(s) Performed: LAPAROSCOPIC CHOLECYSTECTOMY (N/A Abdomen)  Patient Location: PACU  Anesthesia Type:General  Level of Consciousness: awake, alert , oriented and patient cooperative  Airway & Oxygen Therapy: Patient connected to nasal cannula oxygen  Post-op Assessment: Post -op Vital signs reviewed and stable  Post vital signs: stable  Last Vitals:  Vitals Value Taken Time  BP    Temp    Pulse    Resp    SpO2      Last Pain:  Vitals:   12/08/18 1007  TempSrc:   PainSc: 0-No pain         Complications: No apparent anesthesia complications

## 2018-12-08 NOTE — Anesthesia Procedure Notes (Signed)
Procedure Name: Intubation Date/Time: 12/08/2018 11:35 AM Performed by: Lavell Luster, CRNA Pre-anesthesia Checklist: Patient identified, Emergency Drugs available, Suction available, Patient being monitored and Timeout performed Patient Re-evaluated:Patient Re-evaluated prior to induction Oxygen Delivery Method: Circle system utilized Preoxygenation: Pre-oxygenation with 100% oxygen Induction Type: IV induction Ventilation: Mask ventilation without difficulty Laryngoscope Size: Mac and 4 Grade View: Grade I Tube size: 7.5 mm Number of attempts: 1 Airway Equipment and Method: Stylet Placement Confirmation: ETT inserted through vocal cords under direct vision,  positive ETCO2 and breath sounds checked- equal and bilateral Secured at: 22 cm Tube secured with: Tape Dental Injury: Teeth and Oropharynx as per pre-operative assessment

## 2018-12-08 NOTE — Progress Notes (Signed)
Patient admitted to unit from PACU, VSS. Patient settled in bed and oriented to unit and hospital routine. Patient has 4 lap sites to abdomen with skin glue, clean, dry and intact. Pt resting comfortably in bed with call bell in reach and side rails up. Instructed patient to utilize call bell for assistance. Will continue to monitor closely for remainder of shift.

## 2018-12-08 NOTE — Interval H&P Note (Signed)
History and Physical Interval Note:no change in H and P  12/08/2018 10:43 AM  Beth [name]  has presented today for surgery, with the diagnosis of BILIARY DYSKENISIS.  The various methods of treatment have been discussed with the patient and family. After consideration of risks, benefits and other options for treatment, the patient has consented to  Procedure(s): LAPAROSCOPIC CHOLECYSTECTOMY (N/A) as a surgical intervention.  The patient's history has been reviewed, patient examined, no change in status, stable for surgery.  I have reviewed the patient's chart and labs.  Questions were answered to the patient's satisfaction.     Coralie Keens

## 2018-12-08 NOTE — Op Note (Signed)
Laparoscopic Cholecystectomy Procedure Note  Indications: This patient presents with symptomatic gallbladder disease and will undergo laparoscopic cholecystectomy.  Pre-operative Diagnosis: biliary dyskinesia  Post-operative Diagnosis: Same  Surgeon: Coralie Keens   Assistants: 0  Anesthesia: General endotracheal anesthesia  ASA Class: 3  Procedure Details  The patient was seen again in the Holding Room. The risks, benefits, complications, treatment options, and expected outcomes were discussed with the patient. The possibilities of reaction to medication, pulmonary aspiration, perforation of viscus, bleeding, recurrent infection, finding a normal gallbladder, the need for additional procedures, failure to diagnose a condition, the possible need to convert to an open procedure, and creating a complication requiring transfusion or operation were discussed with the patient. The likelihood of improving the patient's symptoms with return to their baseline status is good.  The patient and/or family concurred with the proposed plan, giving informed consent. The site of surgery properly noted. The patient was taken to Operating Room, identified as Beth [name] and the procedure verified as Laparoscopic Cholecystectomy with Intraoperative Cholangiogram. A Time Out was held and the above information confirmed.  Prior to the induction of general anesthesia, antibiotic prophylaxis was administered. General endotracheal anesthesia was then administered and tolerated well. After the induction, the abdomen was prepped with Chloraprep and draped in sterile fashion. The patient was positioned in the supine position.  Local anesthetic agent was injected into the skin near the umbilicus and an incision made. We dissected down to the abdominal fascia with blunt dissection.  The fascia was incised vertically and we entered the peritoneal cavity bluntly.  A pursestring suture of 0-Vicryl was placed around the  fascial opening.  The Hasson cannula was inserted and secured with the stay suture.  Pneumoperitoneum was then created with CO2 and tolerated well without any adverse changes in the patient's vital signs. A 5-mm port was placed in the subxiphoid position.  Two 5-mm ports were placed in the right upper quadrant. All skin incisions were infiltrated with a local anesthetic agent before making the incision and placing the trocars.   We positioned the patient in reverse Trendelenburg, tilted slightly to the patient's left.  The gallbladder was identified, the fundus grasped and retracted cephalad. Adhesions were lysed bluntly and with the electrocautery where indicated, taking care not to injure any adjacent organs or viscus. The infundibulum was grasped and retracted laterally, exposing the peritoneum overlying the triangle of Calot. This was then divided and exposed in a blunt fashion. The cystic duct was clearly identified and bluntly dissected circumferentially. A critical view of the cystic duct and cystic artery was obtained.  The cystic duct was then ligated with clips and divided. The cystic artery was, dissected free, ligated with clips and divided as well.   The gallbladder was dissected from the liver bed in retrograde fashion with the electrocautery. The gallbladder was removed and placed in an Endocatch sac. The liver bed was irrigated and inspected. Hemostasis was achieved with the electrocautery. Copious irrigation was utilized and was repeatedly aspirated until clear.  The gallbladder and Endocatch sac were then removed through the umbilical port site.  The pursestring suture was used to close the umbilical fascia.    We again inspected the right upper quadrant for hemostasis.  Pneumoperitoneum was released as we removed the trocars.  4-0 Monocryl was used to close the skin.  Skin glue was then applied. The patient was then extubated and brought to the recovery room in stable condition. Instrument,  sponge, and needle counts were correct  at closure and at the conclusion of the case.   Findings: Chronic Cholecystitis without Cholelithiasis. Questionable small, mildly cirrhotic liver  Estimated Blood Loss: Minimal         Drains: 0         Specimens: Gallbladder           Complications: None; patient tolerated the procedure well.         Disposition: PACU - hemodynamically stable.         Condition: stable

## 2018-12-09 ENCOUNTER — Encounter (HOSPITAL_COMMUNITY): Payer: Self-pay | Admitting: Surgery

## 2018-12-09 DIAGNOSIS — K811 Chronic cholecystitis: Secondary | ICD-10-CM | POA: Diagnosis not present

## 2018-12-09 LAB — SURGICAL PATHOLOGY

## 2018-12-09 LAB — GLUCOSE, CAPILLARY
Glucose-Capillary: 68 mg/dL — ABNORMAL LOW (ref 70–99)
Glucose-Capillary: 94 mg/dL (ref 70–99)

## 2018-12-09 MED ORDER — TRAMADOL HCL 50 MG PO TABS: 50.0000 mg | ORAL_TABLET | Freq: Four times a day (QID) | ORAL | 0 refills | Status: DC | PRN

## 2018-12-09 MED ORDER — PROMETHAZINE HCL 25 MG PO TABS: 12.5000 mg | ORAL_TABLET | Freq: Four times a day (QID) | ORAL | 0 refills | Status: DC | PRN

## 2018-12-09 NOTE — Anesthesia Postprocedure Evaluation (Signed)
Anesthesia Post Note  Patient: Beola Cord  Procedure(s) Performed: LAPAROSCOPIC CHOLECYSTECTOMY (N/A Abdomen)     Patient location during evaluation: PACU Anesthesia Type: General Level of consciousness: awake and alert Pain management: pain level controlled Vital Signs Assessment: post-procedure vital signs reviewed and stable Respiratory status: spontaneous breathing, nonlabored ventilation, respiratory function stable and patient connected to nasal cannula oxygen Cardiovascular status: blood pressure returned to baseline and stable Postop Assessment: no apparent nausea or vomiting Anesthetic complications: no    Last Vitals:  Vitals:   12/08/18 2338 12/09/18 0437  BP: 104/70 111/64  Pulse: 72 77  Resp:    Temp: 36.6 C 36.5 C  SpO2: 90% 94%    Last Pain:  Vitals:   12/09/18 0755  TempSrc:   PainSc: 1                  Vickki Igou

## 2018-12-09 NOTE — Discharge Summary (Signed)
Physician Discharge Summary  Patient ID: Beth [name] MRN: [id number] DOB/AGE: 07-Oct-1950 68 y.o.  Admit date: 12/08/2018 Discharge date: 12/09/2018  Admission Diagnoses:  Discharge Diagnoses:  Active Problems:   S/P laparoscopic cholecystectomy biliary dyskinesia  Discharged Condition: good  Hospital Course: 0  Consults: None  Significant Diagnostic Studies:   Treatments: surgery: lap chole  Discharge Exam: Blood pressure 111/64, pulse 77, temperature 97.7 F (36.5 C), temperature source Oral, resp. rate 16, height 5\' 5"  (1.651 m), weight 100 kg, last menstrual period 04/09/2009, SpO2 94 %. General appearance: alert, cooperative and no distress Resp: clear to auscultation bilaterally Cardio: regular rate and rhythm, S1, S2 normal, no murmur, click, rub or gallop Incision/Wound:abdomen soft, incisions clean  Disposition: Discharge disposition: 01-Home or Self Care        Allergies as of 12/09/2018      Reactions   Penicillins Anaphylaxis, Rash   Childhood allergy Did it involve swelling of the face/tongue/throat, SOB, or low BP? Yes Did it involve sudden or severe rash/hives, skin peeling, or any reaction on the inside of your mouth or nose? Yes Did you need to seek medical attention at a hospital or doctor's office? Yes When did it last happen?Childhood rxn If all above answers are "NO", may proceed with cephalosporin use.   Sulfa Antibiotics Shortness Of Breath   Also light headed, rash   Celebrex [celecoxib] Other (See Comments)   Elevates Blood Pressure   Ciprofloxacin Nausea And Vomiting   Codeine Nausea And Vomiting   And hydrocodone   Levaquin [levofloxacin] Nausea And Vomiting   Motrin Ib [ibuprofen] Hives   Prempro [conj Estrog-medroxyprogest Ace] Hives      Medication List    TAKE these medications   acetaminophen 500 MG tablet Commonly known as: TYLENOL Take 500 mg by mouth 2 (two) times daily.   albuterol 108 (90 Base) MCG/ACT  inhaler Commonly known as: VENTOLIN HFA Use 2 inhalations 15 minutes apart every 4 hours as needed to rescue Asthma Attack What changed:   how much to take  how to take this  when to take this  reasons to take this  additional instructions   ALPRAZolam 0.25 MG tablet Commonly known as: XANAX Take 1/2 to 1 tablet 2 x / day if needed for anxiety. What changed:   how much to take  how to take this  when to take this  additional instructions   aspirin EC 81 MG tablet Take 81 mg by mouth every evening.   cephALEXin 500 MG capsule Commonly known as: KEFLEX Take 500 mg by mouth every 8 (eight) hours.   docusate sodium 100 MG capsule Commonly known as: COLACE Take 100 mg by mouth 2 (two) times daily.   famotidine 10 MG tablet Commonly known as: PEPCID Take 10 mg by mouth at bedtime.   hyoscyamine 0.375 MG 12 hr tablet Commonly known as: LEVBID Take 1 tablet (0.375 mg total) by mouth 2 (two) times daily. As needed.   meloxicam 15 MG tablet Commonly known as: MOBIC Take 1/2 to 1 tablet Daily with Food for Pain & Inflammation and limit to 4 tablets /week to Avoid Kidney Damage   metFORMIN 500 MG 24 hr tablet Commonly known as: GLUCOPHAGE-XR Take 2 tablets 2 x /day with meals for Diabetes What changed:   how much to take  how to take this  when to take this   metoCLOPramide 10 MG tablet Commonly known as: REGLAN TAKE 1 TABLET BY MOUTH FOUR TIMES DAILY EVERY  NIGHT BEFORE A MEAL AND AT BEDTIME FOR ACID REFLUX as needed. What changed:   how much to take  how to take this  when to take this  additional instructions   montelukast 10 MG tablet Commonly known as: SINGULAIR TAKE 1 TABLET BY MOUTH EVERY DAY   Myrbetriq 50 MG Tb24 tablet Generic drug: mirabegron ER Take 50 mg by mouth daily.   olmesartan-hydrochlorothiazide 40-12.5 MG tablet Commonly known as: BENICAR HCT Take 1 tablet by mouth daily. Take 0.5 tablet by mouth daily if BP is >130/90 and 1  tablet by mouth daily if BP is [id number]   OneTouch Delica Lancets 99991111 Misc Check blood sugar 1 time daily-DX-E11.22   OneTouch Verio test strip Generic drug: glucose blood USE TO CHECK BLOOD SUGAR ONCE DAILY   pantoprazole 40 MG tablet Commonly known as: Protonix Take 1 tablet every Morning for Acid Indigestion What changed:   how much to take  how to take this  when to take this  additional instructions   polyethylene glycol powder 17 GM/SCOOP powder Commonly known as: GLYCOLAX/MIRALAX Take 17 g by mouth at bedtime.   promethazine 25 MG tablet Commonly known as: PHENERGAN Take 0.5 tablets (12.5 mg total) by mouth every 6 (six) hours as needed. What changed:   how much to take  reasons to take this   rosuvastatin 10 MG tablet Commonly known as: Crestor Take 1 tablet (10 mg total) by mouth at bedtime.   traMADol 50 MG tablet Commonly known as: ULTRAM Take 1 tablet (50 mg total) by mouth every 6 (six) hours as needed for moderate pain.   venlafaxine XR 150 MG 24 hr capsule Commonly known as: EFFEXOR-XR Take 150 mg by mouth 2 (two) times daily.   Vitamin D3 125 MCG (5000 UT) Caps Take 5,000 Units by mouth daily.   zaleplon 10 MG capsule Commonly known as: SONATA Take 10 mg by mouth at bedtime as needed for sleep.      Follow-up Information    Coralie Keens, MD. Schedule an appointment as soon as possible for a visit in 3 week(s).   Specialty: General Surgery Contact information: [place] [address] [phone]           Signed: Coralie Keens 12/09/2018, 8:09 AM

## 2018-12-09 NOTE — Progress Notes (Signed)
Hypoglycemic Event  CBG: 68  Treatment: 4 oz juice/soda  Symptoms: None  Follow-up CBG: [id number] CBG Result:94   Possible Reasons for Event: Other: poor PO intake post surgery  Comments/MD notified: Dr. Norvel Richards

## 2018-12-09 NOTE — Progress Notes (Signed)
Patient discharged to home. Verbalized understanding of all discharge instructions including incision care, discharge medications and follow up MD visits. 

## 2018-12-09 NOTE — Discharge Instructions (Signed)
[facility] ______[facility] °LAPAROSCOPIC SURGERY: POST OP INSTRUCTIONS °Always review your discharge instruction sheet given to you by the facility where your surgery was performed. °IF YOU HAVE DISABILITY OR FAMILY LEAVE FORMS, YOU MUST BRING THEM TO THE OFFICE FOR PROCESSING.   °DO NOT GIVE THEM TO YOUR DOCTOR. ° °1. A prescription for pain medication may be given to you upon discharge.  Take your pain medication as prescribed, if needed.  If narcotic pain medicine is not needed, then you may take acetaminophen (Tylenol) or ibuprofen (Advil) as needed. °2. Take your usually prescribed medications unless otherwise directed. °3. If you need a refill on your pain medication, please contact your pharmacy.  They will contact our office to request authorization. Prescriptions will not be filled after 5pm or on week-ends. °4. You should follow a light diet the first few days after arrival home, such as soup and crackers, etc.  Be sure to include lots of fluids daily. °5. Most patients will experience some swelling and bruising in the area of the incisions.  Ice packs will help.  Swelling and bruising can take several days to resolve.  °6. It is common to experience some constipation if taking pain medication after surgery.  Increasing fluid intake and taking a stool softener (such as Colace) will usually help or prevent this problem from occurring.  A mild laxative (Milk of Magnesia or Miralax) should be taken according to package instructions if there are no bowel movements after 48 hours. °7. Unless discharge instructions indicate otherwise, you may remove your bandages 24-48 hours after surgery, and you may shower at that time.  You may have steri-strips (small skin tapes) in place directly over the incision.  These strips should be left on the skin for 7-10 days.  If your surgeon used skin glue on the incision, you may shower in 24 hours.  The glue will flake off over the next 2-3 weeks.  Any sutures or  staples will be removed at the office during your follow-up visit. °8. ACTIVITIES:  You may resume regular (light) daily activities beginning the next day--such as daily self-care, walking, climbing stairs--gradually increasing activities as tolerated.  You may have sexual intercourse when it is comfortable.  Refrain from any heavy lifting or straining until approved by your doctor. °a. You may drive when you are no longer taking prescription pain medication, you can comfortably wear a seatbelt, and you can safely maneuver your car and apply brakes. °b. RETURN TO WORK:  __________________________________________________________ °9. You should see your doctor in the office for a follow-up appointment approximately 2-3 weeks after your surgery.  Make sure that you call for this appointment within a day or two after you arrive home to insure a convenient appointment time. °10. OTHER INSTRUCTIONS: __________________________________________________________________________________________________________________________ __________________________________________________________________________________________________________________________ °WHEN TO CALL YOUR DOCTOR: °1. Fever over 101.0 °2. Inability to urinate °3. Continued bleeding from incision. °4. Increased pain, redness, or drainage from the incision. °5. Increasing abdominal pain ° °The clinic staff is available to answer your questions during regular business hours.  Please don’t hesitate to call and ask to speak to one of the nurses for clinical concerns.  If you have a medical emergency, go to the nearest emergency room or call 911.  A surgeon from [facility] is always on call at the hospital. °[address], [address], [place], [place]  [address] ? [address] [address], [place], [place]   [address] °[phone] ? [phone] ? FAX [phone] °Web site:   [email or web address] °

## 2018-12-12 ENCOUNTER — Telehealth: Payer: Self-pay | Admitting: *Deleted

## 2018-12-12 NOTE — Telephone Encounter (Signed)
Called patient on 12/12/2018 , 12:18 PM in an attempt to reach the patient for a hospital follow up.   Admit date: 12/08/18 Discharge: 12/09/18   She does not have any questions or concerns about medications from the hospital admission. The patient's medications were reviewed over the phone, they were counseled to bring in all current medications to the hospital follow up visit.   I advised the patient to call if any questions or concerns arise about the hospital admission or medications    Home health was not started in the hospital.  All questions were answered and a follow up appointment was made. Patient has a telephone visit with Garnet Sierras on 12/15/2018.  Prior to Admission medications   Medication Sig Start Date End Date Taking? Authorizing Provider  acetaminophen (TYLENOL) 500 MG tablet Take 500 mg by mouth 2 (two) times daily.    [provider]  albuterol (VENTOLIN HFA) 108 (90 Base) MCG/ACT inhaler Use 2 inhalations 15 minutes apart every 4 hours as needed to rescue Asthma Attack Patient taking differently: Inhale 2 puffs into the lungs every 4 (four) hours as needed for wheezing or shortness of breath.  09/16/18   [name], MD  ALPRAZolam Duanne Moron) 0.25 MG tablet Take 1/2 to 1 tablet 2 x / day if needed for anxiety. Patient taking differently: Take 0.25 mg by mouth 2 (two) times daily.  07/19/18   [name], MD  aspirin EC 81 MG tablet Take 81 mg by mouth every evening.    [provider]  cephALEXin (KEFLEX) 500 MG capsule Take 500 mg by mouth every 8 (eight) hours. 12/07/18   [provider]  Cholecalciferol (VITAMIN D3) 125 MCG (5000 UT) CAPS Take 5,000 Units by mouth daily.    [provider]  docusate sodium (COLACE) 100 MG capsule Take 100 mg by mouth 2 (two) times daily.    [provider]  famotidine (PEPCID) 10 MG tablet Take 10 mg by mouth at bedtime.    [provider]  hyoscyamine (LEVBID) 0.375 MG 12 hr  tablet Take 1 tablet (0.375 mg total) by mouth 2 (two) times daily. As needed. Patient not taking: Reported on 10/20/2018 09/16/18   Garnet Sierras, NP  meloxicam (MOBIC) 15 MG tablet Take 1/2 to 1 tablet Daily with Food for Pain & Inflammation and limit to 4 tablets /week to Avoid Kidney Damage 10/29/18   [name], MD  metFORMIN (GLUCOPHAGE-XR) 500 MG 24 hr tablet Take 2 tablets 2 x /day with meals for Diabetes Patient taking differently: Take 500 mg by mouth 2 (two) times daily. Take 2 tablets 2 x /day with meals for Diabetes 06/17/18   [name], MD  metoCLOPramide (REGLAN) 10 MG tablet TAKE 1 TABLET BY MOUTH FOUR TIMES DAILY EVERY NIGHT BEFORE A MEAL AND AT BEDTIME FOR ACID REFLUX as needed. Patient taking differently: Take 5 mg by mouth every 6 (six) hours.  09/16/18   McClanahan, Danton Sewer, NP  montelukast (SINGULAIR) 10 MG tablet TAKE 1 TABLET BY MOUTH EVERY DAY Patient taking differently: Take 10 mg by mouth daily.  11/04/18   Liane Comber, NP  MYRBETRIQ 50 MG TB24 tablet Take 50 mg by mouth daily. 10/05/18   [provider]  olmesartan-hydrochlorothiazide (BENICAR HCT) 40-12.5 MG tablet Take 1 tablet by mouth daily. Take 0.5 tablet by mouth daily if BP is >130/90 and 1 tablet by mouth daily if BP is >140/90 10/05/18   [provider]  Jonetta Speak LANCETS [id number] MISC  Check blood sugar 1 time daily-DX-E11.22 11/13/16   [name], MD  [facility] VERIO test strip USE TO CHECK BLOOD SUGAR ONCE DAILY 01/17/18   [name], MD  pantoprazole (PROTONIX) 40 MG tablet Take 1 tablet every Morning for Acid Indigestion Patient taking differently: Take 40 mg by mouth daily.  10/12/18   [name], MD  polyethylene glycol powder (GLYCOLAX/MIRALAX) 17 GM/SCOOP powder Take 17 g by mouth at bedtime.    [provider]  promethazine (PHENERGAN) 25 MG tablet Take 0.5 tablets (12.5 mg total) by mouth every 6 (six) hours as needed. 12/09/18   Coralie Keens, MD   rosuvastatin (CRESTOR) 10 MG tablet Take 1 tablet (10 mg total) by mouth at bedtime. 06/29/18 06/29/19  Vicie Mutters, PA-C  traMADol (ULTRAM) 50 MG tablet Take 1 tablet (50 mg total) by mouth every 6 (six) hours as needed for moderate pain. 12/09/18   Coralie Keens, MD  venlafaxine XR (EFFEXOR-XR) 150 MG 24 hr capsule Take 150 mg by mouth 2 (two) times daily.     [provider]  zaleplon (SONATA) 10 MG capsule Take 10 mg by mouth at bedtime as needed for sleep.     [provider]

## 2018-12-14 NOTE — Progress Notes (Signed)
Hospital follow up  Assessment and Plan: Hospital visit follow up for laparoscopic cholecystectomy by Dr Ninfa Linden. Beth [name] was seen today for hospitalization follow-up.  Diagnoses and all orders for this visit:  Essential hypertension Monitor blood pressure BID and record -     olmesartan (BENICAR) 40 MG tablet; Take 1 tablet (40 mg total) by mouth daily.  S/P laparoscopic cholecystectomy Monitor laparoscopic sites Discussed S&S of infection Discussed pain management, doing well with this no meds Follow up with surgeon  Gastroesophageal reflux disease, unspecified whether esophagitis present Taking protonix daily Taking reglan before meals Increase water intake Monitor dietary intake for triggers  Nausea Discussed medications at length and D/C all unnecessary medications. D/C Meformin, last A1c 5.4 Will continue to monitor Needs refill for phenergan, discussed contacting Dr Ninfa Linden related to amount of phenergan necessary s/p lap surgery  S/P revision of total knee, left Continue moving and walking  Follow up in two weeks for blood pressure evaluation  All medications were reviewed with patient and family and fully reconciled. All questions answered fully, and patient and family members were encouraged to call the office with any further questions or concerns. Discussed goal to avoid readmission related to this diagnosis.  Medications Discontinued During This Encounter  Medication Reason  . montelukast (SINGULAIR) 10 MG tablet   . metFORMIN (GLUCOPHAGE-XR) 500 MG 24 hr tablet Discontinued by provider  . meloxicam (MOBIC) 15 MG tablet   . hyoscyamine (LEVBID) 0.375 MG 12 hr tablet   . traMADol (ULTRAM) 50 MG tablet Discontinued by provider    CAN NOT DO FOR BCBS REGULAR OR MEDICARE Over 40 minutes of exam, counseling, chart review, and complex, high/moderate level critical decision making was performed this visit.   Future Appointments  Date Time Provider [place]  01/26/2019  2:30 PM Liane Comber, NP [facility] None  02/10/2019  2:00 PM Regina Eck, CNM [place] None  07/05/2019  2:00 PM Vicie Mutters, PA-C [facility] None  11/01/2019  2:00 PM [name], MD [facility] None  11/27/2019  2:00 PM Liane Comber, NP [facility] None     HPI 68 y.o.female presents for follow up for transition from recent hospitalization. Admit date to the hospital was 12/08/18, patient was discharged from the hospital on 12/09/18 and our clinical staff contacted the office the day after discharge to set up a follow up appointment. The discharge summary, medications, and diagnostic test results were reviewed before meeting with the patient. The patient was admitted for: laparoscopic cholecystectomy.  Since discharge she has had some decrease in nausea but it is still present and she is taking phenergan around the clock.  She denies any emesis.  Her last BM was one day ago.  She is taking colace twice a day to help with this and she also uses Miralax as needed.  Reports her pain is well controlled, she had tramadol for this.  Reports she has not had to take any medication for the past five days.  Reports she is not getting her energy back.  She has follow up scheduled in three week with Dr Ninfa Linden. She reports that her blood pressure is elevated.  She stopped taking Benicar 40/12.5mg  after hospital discharge.  Since then her blood pressure has increased and elevated today.  She denies any shortness of breath, chest pains or radiating pains, headaches, change in vision or numbness, tingling.   Home health is not involved for this hospital admission.  She had revision on left knee with replacement on 08/30/2018. Outpatient  physical therapy, but has not felt strong enough to go for her knee replacement and then she also needs someone to take her to and from appointments.  She is still ambulating with rolling walker right now related to the knee.    Images while  in the hospital: No results found.    Current Outpatient Medications (Cardiovascular):  .  rosuvastatin (CRESTOR) 10 MG tablet, Take 1 tablet (10 mg total) by mouth at bedtime. Marland Kitchen  olmesartan (BENICAR) 40 MG tablet, Take 1 tablet (40 mg total) by mouth daily. Marland Kitchen  olmesartan-hydrochlorothiazide (BENICAR HCT) 40-12.5 MG tablet, Take 1 tablet by mouth daily. Take 0.5 tablet by mouth daily if BP is >130/90 and 1 tablet by mouth daily if BP is >140/90  Current Outpatient Medications (Respiratory):  .  albuterol (VENTOLIN HFA) 108 (90 Base) MCG/ACT inhaler, Use 2 inhalations 15 minutes apart every 4 hours as needed to rescue Asthma Attack (Patient taking differently: Inhale 2 puffs into the lungs every 4 (four) hours as needed for wheezing or shortness of breath. ) .  promethazine (PHENERGAN) 25 MG tablet, Take 0.5 tablets (12.5 mg total) by mouth every 6 (six) hours as needed.  Current Outpatient Medications (Analgesics):  .  acetaminophen (TYLENOL) 500 MG tablet, Take 500 mg by mouth as needed.  Marland Kitchen  aspirin EC 81 MG tablet, Take 81 mg by mouth every evening.   Current Outpatient Medications (Other):  Marland Kitchen  ALPRAZolam (XANAX) 0.25 MG tablet, Take 1/2 to 1 tablet 2 x / day if needed for anxiety. (Patient taking differently: Take 0.25 mg by mouth 2 (two) times daily. ) .  cephALEXin (KEFLEX) 500 MG capsule, Take 500 mg by mouth every 8 (eight) hours. .  docusate sodium (COLACE) 100 MG capsule, Take 100 mg by mouth 2 (two) times daily. .  famotidine (PEPCID) 10 MG tablet, Take 10 mg by mouth at bedtime. .  metoCLOPramide (REGLAN) 10 MG tablet, TAKE 1 TABLET BY MOUTH FOUR TIMES DAILY EVERY NIGHT BEFORE A MEAL AND AT BEDTIME FOR ACID REFLUX as needed. (Patient taking differently: Take 5 mg by mouth every 6 (six) hours. ) .  MYRBETRIQ 50 MG TB24 tablet, Take 50 mg by mouth daily. Glory Rosebush DELICA LANCETS 99991111 MISC, Check blood sugar 1 time daily-DX-E11.22 .  ONETOUCH VERIO test strip, USE TO CHECK BLOOD  SUGAR ONCE DAILY .  pantoprazole (PROTONIX) 40 MG tablet, Take 1 tablet every Morning for Acid Indigestion (Patient taking differently: Take 40 mg by mouth daily. ) .  polyethylene glycol powder (GLYCOLAX/MIRALAX) 17 GM/SCOOP powder, Take 17 g by mouth at bedtime. Marland Kitchen  venlafaxine XR (EFFEXOR-XR) 150 MG 24 hr capsule, Take 150 mg by mouth 2 (two) times daily.  .  zaleplon (SONATA) 10 MG capsule, Take 10 mg by mouth at bedtime as needed for sleep.  .  Cholecalciferol (VITAMIN D3) 125 MCG (5000 UT) CAPS, Take 5,000 Units by mouth daily.  Past Medical History:  Diagnosis Date  . Abscessed tooth   . Anemia   . Anxiety   . Arthritis   . Bell's palsy 01/1985  . Complication of anesthesia   . COPD (chronic obstructive pulmonary disease) ([place])   . Depression   . Diabetes mellitus without complication ([place])   . GERD (gastroesophageal reflux disease)   . Hypertension   . IBS (irritable bowel syndrome) 12/89  . Obesity, Class III, BMI 40-49.9 (morbid obesity) ([place])   . OSA (obstructive sleep apnea)    no cpap  . Vitamin  D deficiency      Allergies  Allergen Reactions  . Penicillins Anaphylaxis and Rash    Childhood allergy Did it involve swelling of the face/tongue/throat, SOB, or low BP? Yes Did it involve sudden or severe rash/hives, skin peeling, or any reaction on the inside of your mouth or nose? Yes Did you need to seek medical attention at a hospital or doctor's office? Yes When did it last happen?Childhood rxn If all above answers are "NO", may proceed with cephalosporin use.  . Sulfa Antibiotics Shortness Of Breath    Also light headed, rash  . Celebrex [Celecoxib] Other (See Comments)    Elevates Blood Pressure   . Ciprofloxacin Nausea And Vomiting  . Codeine Nausea And Vomiting    And hydrocodone  . Levaquin [Levofloxacin] Nausea And Vomiting  . Motrin Ib [Ibuprofen] Hives  . Prempro [Conj Estrog-Medroxyprogest Ace] Hives    ROS: all negative except above.   Physical  Exam:  General : Well sounding patient in no apparent distress HEENT: no hoarseness, no cough for duration of visit Lungs: speaks in complete sentences, no audible wheezing, no apparent distress Neurological: alert, oriented x 3 Psychiatric: pleasant, judgement appropriate   Garnet Sierras, NP 4:35 PM [facility]

## 2018-12-15 ENCOUNTER — Ambulatory Visit: Payer: Medicare Other | Admitting: Adult Health Nurse Practitioner

## 2018-12-15 ENCOUNTER — Encounter: Payer: Self-pay | Admitting: Adult Health Nurse Practitioner

## 2018-12-15 ENCOUNTER — Other Ambulatory Visit: Payer: Self-pay

## 2018-12-15 VITALS — BP 151/108 | HR 95

## 2018-12-15 DIAGNOSIS — Z9049 Acquired absence of other specified parts of digestive tract: Secondary | ICD-10-CM | POA: Diagnosis not present

## 2018-12-15 DIAGNOSIS — I10 Essential (primary) hypertension: Secondary | ICD-10-CM | POA: Diagnosis not present

## 2018-12-15 DIAGNOSIS — Z96652 Presence of left artificial knee joint: Secondary | ICD-10-CM

## 2018-12-15 DIAGNOSIS — R110 Nausea: Secondary | ICD-10-CM | POA: Diagnosis not present

## 2018-12-15 DIAGNOSIS — K219 Gastro-esophageal reflux disease without esophagitis: Secondary | ICD-10-CM | POA: Diagnosis not present

## 2018-12-15 MED ORDER — OLMESARTAN MEDOXOMIL 40 MG PO TABS: 40.0000 mg | ORAL_TABLET | Freq: Every day | ORAL | 2 refills | Status: DC

## 2018-12-15 NOTE — Patient Instructions (Signed)
Send to patient

## 2018-12-20 ENCOUNTER — Ambulatory Visit: Payer: Medicare Other | Admitting: Certified Nurse Midwife

## 2018-12-21 ENCOUNTER — Other Ambulatory Visit: Payer: Self-pay | Admitting: Internal Medicine

## 2018-12-21 ENCOUNTER — Other Ambulatory Visit: Payer: Self-pay | Admitting: *Deleted

## 2018-12-21 DIAGNOSIS — K219 Gastro-esophageal reflux disease without esophagitis: Secondary | ICD-10-CM

## 2018-12-21 MED ORDER — PANTOPRAZOLE SODIUM 40 MG PO TBEC: DELAYED_RELEASE_TABLET | ORAL | 3 refills | Status: DC

## 2018-12-22 ENCOUNTER — Other Ambulatory Visit: Payer: Self-pay

## 2018-12-22 DIAGNOSIS — Z20822 Contact with and (suspected) exposure to covid-19: Secondary | ICD-10-CM

## 2018-12-24 LAB — NOVEL CORONAVIRUS, NAA: SARS-CoV-2, NAA: NOT DETECTED

## 2019-01-03 ENCOUNTER — Other Ambulatory Visit: Payer: Self-pay | Admitting: Internal Medicine

## 2019-01-03 DIAGNOSIS — Z79899 Other long term (current) drug therapy: Secondary | ICD-10-CM

## 2019-01-03 DIAGNOSIS — K219 Gastro-esophageal reflux disease without esophagitis: Secondary | ICD-10-CM

## 2019-01-03 MED ORDER — OLMESARTAN MEDOXOMIL-HCTZ 40-12.5 MG PO TABS: ORAL_TABLET | ORAL | 3 refills | Status: DC

## 2019-01-03 MED ORDER — METOCLOPRAMIDE HCL 10 MG PO TABS: ORAL_TABLET | ORAL | 3 refills | Status: DC

## 2019-01-25 DIAGNOSIS — E663 Overweight: Secondary | ICD-10-CM | POA: Insufficient documentation

## 2019-01-25 NOTE — Progress Notes (Signed)
FOLLOW UP  Assessment and Plan:   Hypertension Above goal; will restart hctz tolerated well before 12.5 then 25 if needed - recheck in 4 weeks Monitor blood pressure at home; patient to call if consistently greater than 130/80 Continue DASH diet.   Reminder to go to the ER if any CP, SOB, nausea, dizziness, severe HA, changes vision/speech, left arm numbness and tingling and jaw pain.  COPD Per imaging; asymptomatic, monitor;   OSA/obesity hypoventilation syndrome  Weight loss to achieve normal weight encouraged; has lost 60+ lb in past year; not snoring; declines CPAP  Cholesterol Now taking rosuvastatin 10 mg daily LDL goal <70 Continue low cholesterol diet and exercise.  Check lipid panel.   Diabetes with diabetic chronic kidney disease Currently controlled in prediabetic range by diet/lifestyle only - discussed need for weight loss Continue diet and exercise.  Perform daily foot/skin check, notify office of any concerning changes.  Check A1C  Morbid obesity with co morbidities - BMI 30+ with OSA, T2DM, htn, hyperlipidemia Long discussion about weight loss, diet, and exercise Recommended diet heavy in fruits and veggies and low in animal meats, cheeses, and dairy products, appropriate calorie intake Discussed ideal weight for height  Patient will work on continue with exercise, portion control  Will follow up in 3 months  Vitamin D Def At goal at last visit; continue supplementation to maintain goal of 70-100 Defer Vit D level  GERD Symptoms well managed without breakthrough Will have her attempt to come off of reglan, continue protonix, famotidine   Depression in remission/ anxiety Depression managed by  ; discussed benzo use and advised to avoid daily, use PRN as prescribed Lifestyle discussed: diet/exerise, sleep hygiene, stress management, hydration  Tobacco use Discussed risks associated with tobacco use and advised to reduce or quit Patient is not ready to  do so, but advised to consider strongly Will follow up at the next visit  -lung cancer screening with low dose CT discussed as recommended by guidelines based on age, number of pack year history.  Discussed risks of screening including but not limited to false positives on xray, further testing or consultation with specialist, and possible false negative CT as well. Understanding expressed and wishes to proceed with CT testing. Order placed.    Continue diet and meds as discussed. Further disposition pending results of labs. Discussed med's effects and SE's.   Over 30 minutes of exam, counseling, chart review, and critical decision making was performed.   Future Appointments  Date Time Provider [place]  02/10/2019  2:00 PM Regina Eck, CNM [place] None  07/05/2019  2:00 PM Vicie Mutters, PA-C [facility] None  11/01/2019  2:00 PM [name], MD [facility] None  11/27/2019  2:00 PM Liane Comber, NP [facility] None    ----------------------------------------------------------------------------------------------------------------------  HPI 68 y.o. female  presents for 3 month follow up on hypertension, cholesterol, diabetes, morbid obesity, GERD, depression and vitamin D deficiency. Pain syndrome consequent of Lumbar DDD and has had surgery by Dr. Sherley Bounds and reports she is doing very well.   She had a tooth extracted due to abscess and doing much better, finishing up a course of clindamycin and doing well. She had lab cholecystectomy by Dr. Ninfa Linden on 12/08/2018 and has done well.   She had revision of L knee arthroplasty with Dr. Tracie Harrier ([facility] in [place]), still has some instability and doing PT three days a week and using a walker for stability.   she has a diagnosis of depression/anxiety  currently in remission on medications (venlafaxine daily, 0.25 mg xanax BID PRN, reports symptoms are well controlled on current regimen. she takes the xanax 0.25  mg BID . She is also prescribed zaleplon 10 mg RPN for sleep which she takes rarely. She is managed by a psych NP Pauline Good.   she currently continues to smoke 0.5 pack a day; smoked 1 pack day from age [age], less before and has cut down recently; discussed risks associated with smoking, patient is not ready to quit but is working on tapering down. She has COPD diagnosed by imaging currently asymptomatic without daily inhaler. Had CXR in 2018 which was clear, never CT screening.   She has hx of sleep apnea, reports was mild, was recommended CPAP but couldn't tolerate. She denies AM headaches; no snoring.   Has OAB with episodes of incontinence, has seen Dr. Matilde Sprang, currently on myrbetriq 50 mg but feels previous agent oxybutynin worked better. Unsure of dose and will message back after checking with pharmacy.   she has a diagnosis of GERD which is currently managed by protonix 40 mg daily AM, famotidine 10 mg at night, and reglan 5 mg BID she reports symptoms is currently well controlled, and denies breakthrough reflux, burning in chest, hoarseness or cough.    BMI is Body mass index is 34.08 kg/m., she has been working on diet and exercise. Doing PT three days a week. She is down from 270 lb in Sept 2019.  Wt Readings from Last 3 Encounters:  01/26/19 204 lb 12.8 oz (92.9 kg)  12/09/18 220 lb 7.4 oz (100 kg)  12/01/18 222 lb 6.4 oz (100.9 kg)   Her blood pressure has not been controlled at home (140s/90s), today their BP is BP: (!) 142/90  She does workout. She denies chest pain, shortness of breath, dizziness.   She is on cholesterol medication (rosuvastatin 10 mg daily) and denies myalgias. Her cholesterol is not at goal of LDL <70.  The cholesterol last visit was:   Lab Results  Component Value Date   CHOL 171 06/22/2018   HDL 62 06/22/2018   LDLCALC 87 06/22/2018   TRIG 123 06/22/2018   CHOLHDL 2.8 06/22/2018    She has been working on diet and exercise for T2DM currently  controlled by diet alone (A1C 6.7% in 09/2018, has since improved to WNL), and denies foot ulcerations, increased appetite, nausea, paresthesia of the feet, polydipsia, polyuria, visual disturbances and vomiting. Checks sporadical fasting - running 90-100. 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 acetaminophen (TYLENOL) 500 MG tablet Take 500 mg by mouth as needed.   Marland Kitchen albuterol (VENTOLIN HFA) 108 (90 Base) MCG/ACT inhaler Use 2 inhalations 15 minutes apart every 4 hours as needed to rescue Asthma Attack (Patient taking differently: Inhale 2 puffs into the lungs every 4 (four) hours as needed for wheezing or shortness of breath. )  . ALPRAZolam (XANAX) 0.25 MG tablet Take 1/2 to 1 tablet 2 x / day if needed for anxiety. (  Patient taking differently: Take 0.25 mg by mouth 2 (two) times daily. )  . Cholecalciferol (VITAMIN D3) 125 MCG (5000 UT) CAPS Take 5,000 Units by mouth daily.  Marland Kitchen docusate sodium (COLACE) 100 MG capsule Take 100 mg by mouth 2 (two) times daily.  . famotidine (PEPCID) 10 MG tablet Take 10 mg by mouth at bedtime.  . metoCLOPramide (REGLAN) 10 MG tablet Take 1 tablet 4 x /day before Meals & Bedtime for Acid Reflux  . MYRBETRIQ 50 MG TB24 tablet Take 50 mg by mouth daily.  Marland Kitchen olmesartan (BENICAR) 40 MG tablet Take 1 tablet (40 mg total) by mouth daily.  Glory Rosebush DELICA LANCETS 99991111 MISC Check blood sugar 1 time daily-DX-E11.22  . ONETOUCH VERIO test strip USE TO CHECK BLOOD SUGAR ONCE DAILY  . pantoprazole (PROTONIX) 40 MG tablet Take 1 tablet every Morning for Acid Indigestion  .  polyethylene glycol powder (GLYCOLAX/MIRALAX) 17 GM/SCOOP powder Take 17 g by mouth at bedtime.  . promethazine (PHENERGAN) 25 MG tablet Take 0.5 tablets (12.5 mg total) by mouth every 6 (six) hours as needed.  . rosuvastatin (CRESTOR) 10 MG tablet Take 1 tablet (10 mg total) by mouth at bedtime.  Marland Kitchen venlafaxine XR (EFFEXOR-XR) 150 MG 24 hr capsule Take 150 mg by mouth 2 (two) times daily.   . zaleplon (SONATA) 10 MG capsule Take 10 mg by mouth at bedtime as needed for sleep.   Marland Kitchen aspirin EC 81 MG tablet Take 81 mg by mouth every evening.   No current facility-administered medications on file prior to visit.      Allergies:  Allergies  Allergen Reactions  . Penicillins Anaphylaxis and Rash    Childhood allergy Did it involve swelling of the face/tongue/throat, SOB, or low BP? Yes Did it involve sudden or severe rash/hives, skin peeling, or any reaction on the inside of your mouth or nose? Yes Did you need to seek medical attention at a hospital or doctor's office? Yes When did it last happen?Childhood rxn If all above answers are "NO", may proceed with cephalosporin use.  . Sulfa Antibiotics Shortness Of Breath    Also light headed, rash  . Celebrex [Celecoxib] Other (See Comments)    Elevates Blood Pressure   . Ciprofloxacin Nausea And Vomiting  . Codeine Nausea And Vomiting    And hydrocodone  . Levaquin [Levofloxacin] Nausea And Vomiting  . Motrin Ib [Ibuprofen] Hives  . Prempro [Conj Estrog-Medroxyprogest Ace] Hives     Medical History:  Past Medical History:  Diagnosis Date  . Abscessed tooth   . Anemia   . Anxiety   . Arthritis   . Bell's palsy 01/1985  . Complication of anesthesia   . COPD (chronic obstructive pulmonary disease) ([place])   . Depression   . Diabetes mellitus without complication ([place])   . GERD (gastroesophageal reflux disease)   . Hypertension   . IBS (irritable bowel syndrome) 12/89  . Obesity, Class III, BMI 40-49.9 (morbid obesity) ([place])   . OSA  (obstructive sleep apnea)    no cpap  . Vitamin D deficiency    Family history- Reviewed and unchanged Social history- Reviewed and unchanged   Review of Systems:  Review of Systems  Constitutional: Negative for malaise/fatigue and weight loss.  HENT: Negative for hearing loss and tinnitus.   Eyes: Negative for blurred vision and double vision.  Respiratory: Negative for cough, shortness of breath and wheezing.   Cardiovascular: Negative for chest pain, palpitations, orthopnea, claudication and leg swelling.  Gastrointestinal: Negative for abdominal pain, blood in stool, constipation, diarrhea, heartburn, melena, nausea and vomiting.  Genitourinary: Negative.   Musculoskeletal: Positive for joint pain (knee). Negative for myalgias.  Skin: Negative for rash.  Neurological: Negative for dizziness, tingling, sensory change, weakness and headaches.  Endo/Heme/Allergies: Negative for polydipsia.  Psychiatric/Behavioral: Negative.   All other systems reviewed and are negative.     Physical Exam: BP (!) 142/90   Pulse 80   Temp 97.8 F (36.6 C)   Resp 16   Wt 204 lb 12.8 oz (92.9 kg)   LMP 04/09/2009   BMI 34.08 kg/m  Wt Readings from Last 3 Encounters:  01/26/19 204 lb 12.8 oz (92.9 kg)  12/09/18 220 lb 7.4 oz (100 kg)  12/01/18 222 lb 6.4 oz (100.9 kg)   General Appearance: Well nourished, in no apparent distress. Eyes: PERRLA, EOMs, conjunctiva no swelling or erythema Sinuses: No Frontal/maxillary tenderness ENT/Mouth: Ext aud canals clear, TMs without erythema, bulging. No erythema, swelling, or exudate on post pharynx.  Tonsils not swollen or erythematous. Hearing normal.  Neck: Supple, thyroid normal.  Respiratory: Respiratory effort normal, BS equal bilaterally without rales, rhonchi, wheezing or stridor.  Cardio: RRR with no MRGs. Brisk peripheral pulses without edema.  Abdomen: Soft, + BS.  Non tender, no guarding, rebound, hernias, masses. Lymphatics: Non tender  without lymphadenopathy.  Musculoskeletal: Full ROM, 5/5 strength, Normal gait with cane Skin: Warm, dry without rashes, lesions, ecchymosis.  Neuro: Cranial nerves intact. No cerebellar symptoms.  Psych: Awake and oriented X 3, normal affect, Insight and Judgment appropriate.    Izora Ribas, NP 3:04 PM [facility]

## 2019-01-26 ENCOUNTER — Ambulatory Visit (INDEPENDENT_AMBULATORY_CARE_PROVIDER_SITE_OTHER): Payer: Medicare Other | Admitting: Adult Health

## 2019-01-26 ENCOUNTER — Other Ambulatory Visit: Payer: Self-pay

## 2019-01-26 ENCOUNTER — Encounter: Payer: Self-pay | Admitting: Adult Health

## 2019-01-26 VITALS — BP 142/90 | HR 80 | Temp 97.8°F | Resp 16 | Wt 204.8 lb

## 2019-01-26 DIAGNOSIS — Z23 Encounter for immunization: Secondary | ICD-10-CM | POA: Diagnosis not present

## 2019-01-26 DIAGNOSIS — E1169 Type 2 diabetes mellitus with other specified complication: Secondary | ICD-10-CM

## 2019-01-26 DIAGNOSIS — Z79899 Other long term (current) drug therapy: Secondary | ICD-10-CM

## 2019-01-26 DIAGNOSIS — I10 Essential (primary) hypertension: Secondary | ICD-10-CM | POA: Diagnosis not present

## 2019-01-26 DIAGNOSIS — F325 Major depressive disorder, single episode, in full remission: Secondary | ICD-10-CM

## 2019-01-26 DIAGNOSIS — E1122 Type 2 diabetes mellitus with diabetic chronic kidney disease: Secondary | ICD-10-CM

## 2019-01-26 DIAGNOSIS — E559 Vitamin D deficiency, unspecified: Secondary | ICD-10-CM

## 2019-01-26 DIAGNOSIS — E662 Morbid (severe) obesity with alveolar hypoventilation: Secondary | ICD-10-CM | POA: Diagnosis not present

## 2019-01-26 DIAGNOSIS — G4733 Obstructive sleep apnea (adult) (pediatric): Secondary | ICD-10-CM | POA: Diagnosis not present

## 2019-01-26 DIAGNOSIS — J449 Chronic obstructive pulmonary disease, unspecified: Secondary | ICD-10-CM | POA: Diagnosis not present

## 2019-01-26 DIAGNOSIS — Z122 Encounter for screening for malignant neoplasm of respiratory organs: Secondary | ICD-10-CM

## 2019-01-26 DIAGNOSIS — E785 Hyperlipidemia, unspecified: Secondary | ICD-10-CM

## 2019-01-26 MED ORDER — HYDROCHLOROTHIAZIDE 25 MG PO TABS: ORAL_TABLET | ORAL | 1 refills | Status: DC

## 2019-01-26 NOTE — Patient Instructions (Addendum)
Goals    . Blood Pressure < 130/80    . HEMOGLOBIN A1C < 6.0    . LDL CALC < 100    . Quit Smoking    . Weight (lb) < 200 lb (90.7 kg)       Please add hydrochlorothiazide 1/2 tab in the morning for blood pressure; increase to full tab if needed to reach goal before recheck in 4 weeks  Try adding daily senokot - as needed - gentle stimulant to help bowels move   Please try cutting back on reglan - then if doing well can try cutting back on famotidine or do just as needed     Hydrochlorothiazide, HCTZ capsules or tablets What is this medicine? HYDROCHLOROTHIAZIDE (hye droe klor oh THYE a zide) is a diuretic. It increases the amount of urine passed, which causes the body to lose salt and water. This medicine is used to treat high blood pressure. It is also reduces the swelling and water retention caused by various medical conditions, such as heart, liver, or kidney disease. This medicine may be used for other purposes; ask your health care provider or pharmacist if you have questions. COMMON BRAND NAME(S): Esidrix, Ezide, HydroDIURIL, Microzide, Oretic, Zide What should I tell my health care provider before I take this medicine? They need to know if you have any of these conditions:  diabetes  gout  immune system problems, like lupus  kidney disease or kidney stones  liver disease  pancreatitis  small amount of urine or difficulty passing urine  an unusual or allergic reaction to hydrochlorothiazide, sulfa drugs, other medicines, foods, dyes, or preservatives  pregnant or trying to get pregnant  breast-feeding How should I use this medicine? Take this medicine by mouth with a glass of water. Follow the directions on the prescription label. Take your medicine at regular intervals. Remember that you will need to pass urine frequently after taking this medicine. Do not take your doses at a time of day that will cause you problems. Do not stop taking your medicine unless your  doctor tells you to. Talk to your pediatrician regarding the use of this medicine in children. Special care may be needed. Overdosage: If you think you have taken too much of this medicine contact a poison control center or emergency room at once. NOTE: This medicine is only for you. Do not share this medicine with others. What if I miss a dose? If you miss a dose, take it as soon as you can. If it is almost time for your next dose, take only that dose. Do not take double or extra doses. What may interact with this medicine?  cholestyramine  colestipol  digoxin  dofetilide  lithium  medicines for blood pressure  medicines for diabetes  medicines that relax muscles for surgery  other diuretics  steroid medicines like prednisone or cortisone This list may not describe all possible interactions. Give your health care provider a list of all the medicines, herbs, non-prescription drugs, or dietary supplements you use. Also tell them if you smoke, drink alcohol, or use illegal drugs. Some items may interact with your medicine. What should I watch for while using this medicine? Visit your doctor or health care professional for regular checks on your progress. Check your blood pressure as directed. Ask your doctor or health care professional what your blood pressure should be and when you should contact him or her. You may need to be on a special diet while taking this medicine.  Ask your doctor. Check with your doctor or health care professional if you get an attack of severe diarrhea, nausea and vomiting, or if you sweat a lot. The loss of too much body fluid can make it dangerous for you to take this medicine. You may get drowsy or dizzy. Do not drive, use machinery, or do anything that needs mental alertness until you know how this medicine affects you. Do not stand or sit up quickly, especially if you are an older patient. This reduces the risk of dizzy or fainting spells. Alcohol may  interfere with the effect of this medicine. Avoid alcoholic drinks. This medicine may increase blood sugar. Ask your healthcare provider if changes in diet or medicines are needed if you have diabetes. This medicine can make you more sensitive to the sun. Keep out of the sun. If you cannot avoid being in the sun, wear protective clothing and use sunscreen. Do not use sun lamps or tanning beds/booths. What side effects may I notice from receiving this medicine? Side effects that you should report to your doctor or health care professional as soon as possible:  allergic reactions such as skin rash or itching, hives, swelling of the lips, mouth, tongue, or throat  changes in vision  chest pain  eye pain  fast or irregular heartbeat  feeling faint or lightheaded, falls  gout attack  muscle pain or cramps  pain or difficulty when passing urine  pain, tingling, numbness in the hands or feet  redness, blistering, peeling or loosening of the skin, including inside the mouth   signs and symptoms of high blood sugar such as being more thirsty or hungry or having to urinate more than normal. You may also feel very tired or have blurry vision.  unusually weak Side effects that usually do not require medical attention (report to your doctor or health care professional if they continue or are bothersome):  change in sex drive or performance  dry mouth  headache  stomach upset This list may not describe all possible side effects. Call your doctor for medical advice about side effects. You may report side effects to FDA at 1-800-FDA-1088. Where should I keep my medicine? Keep out of the reach of children. Store at room temperature between 15 and 30 degrees C (59 and 86 degrees F). Do not freeze. Protect from light and moisture. Keep container closed tightly. Throw away any unused medicine after the expiration date. NOTE: This sheet is a summary. It may not cover all possible information.  If you have questions about this medicine, talk to your doctor, pharmacist, or health care provider.  2020 Elsevier/Gold Standard (2017-12-15 09:25:06)

## 2019-01-27 ENCOUNTER — Other Ambulatory Visit: Payer: Self-pay | Admitting: Adult Health

## 2019-01-27 DIAGNOSIS — E1122 Type 2 diabetes mellitus with diabetic chronic kidney disease: Secondary | ICD-10-CM

## 2019-01-27 DIAGNOSIS — E782 Mixed hyperlipidemia: Secondary | ICD-10-CM

## 2019-01-27 LAB — LIPID PANEL
Cholesterol: 155 mg/dL (ref <200)
HDL: 46 mg/dL — ABNORMAL LOW (ref >=50)
LDL Cholesterol (Calc): 92 mg/dL (calc)
Non-HDL Cholesterol (Calc): 109 mg/dL (calc) (ref <130)
Total CHOL/HDL Ratio: 3.4 (calc) (ref <5.0)
Triglycerides: 83 mg/dL (ref <150)

## 2019-01-27 LAB — CBC WITH DIFFERENTIAL/PLATELET
Absolute Monocytes: 462 cells/uL (ref 200–950)
Basophils Absolute: 28 cells/uL (ref 0–200)
Basophils Relative: 0.5 %
Eosinophils Absolute: 193 cells/uL (ref 15–500)
Eosinophils Relative: 3.5 %
HCT: 38.6 % (ref 35.0–45.0)
Hemoglobin: 12.1 g/dL (ref 11.7–15.5)
Lymphs Abs: 1639 cells/uL (ref 850–3900)
MCH: 26.4 pg — ABNORMAL LOW (ref 27.0–33.0)
MCHC: 31.3 g/dL — ABNORMAL LOW (ref 32.0–36.0)
MCV: 84.1 fL (ref 80.0–100.0)
MPV: 11.3 fL (ref 7.5–12.5)
Monocytes Relative: 8.4 %
Neutro Abs: 3179 cells/uL (ref 1500–7800)
Neutrophils Relative %: 57.8 %
Platelets: 223 10*3/uL (ref 140–400)
RBC: 4.59 10*6/uL (ref 3.80–5.10)
RDW: 16.4 % — ABNORMAL HIGH (ref 11.0–15.0)
Total Lymphocyte: 29.8 %
WBC: 5.5 10*3/uL (ref 3.8–10.8)

## 2019-01-27 LAB — COMPLETE METABOLIC PANEL WITH GFR
AG Ratio: 1.4 (calc) (ref 1.0–2.5)
ALT: 22 U/L (ref 6–29)
AST: 32 U/L (ref 10–35)
Albumin: 3.7 g/dL (ref 3.6–5.1)
Alkaline phosphatase (APISO): 168 U/L — ABNORMAL HIGH (ref 37–153)
BUN: 9 mg/dL (ref 7–25)
CO2: 32 mmol/L (ref 20–32)
Calcium: 9.4 mg/dL (ref 8.6–10.4)
Chloride: 100 mmol/L (ref 98–110)
Creat: 0.64 mg/dL (ref 0.50–0.99)
GFR, Est African American: 108 mL/min/{1.73_m2} (ref >=60)
GFR, Est Non African American: 93 mL/min/{1.73_m2} (ref >=60)
Globulin: 2.6 g/dL (calc) (ref 1.9–3.7)
Glucose, Bld: 72 mg/dL (ref 65–99)
Potassium: 3.8 mmol/L (ref 3.5–5.3)
Sodium: 141 mmol/L (ref 135–146)
Total Bilirubin: 0.4 mg/dL (ref 0.2–1.2)
Total Protein: 6.3 g/dL (ref 6.1–8.1)

## 2019-01-27 LAB — MAGNESIUM: Magnesium: 2 mg/dL (ref 1.5–2.5)

## 2019-01-27 LAB — TSH: TSH: 1.84 mIU/L (ref 0.40–4.50)

## 2019-01-27 MED ORDER — ROSUVASTATIN CALCIUM 20 MG PO TABS: 20.0000 mg | ORAL_TABLET | Freq: Every day | ORAL | 1 refills | Status: DC

## 2019-02-10 ENCOUNTER — Ambulatory Visit: Payer: Medicare Other | Admitting: Certified Nurse Midwife

## 2019-03-08 ENCOUNTER — Ambulatory Visit (INDEPENDENT_AMBULATORY_CARE_PROVIDER_SITE_OTHER): Payer: Medicare Other

## 2019-03-08 ENCOUNTER — Other Ambulatory Visit: Payer: Self-pay

## 2019-03-08 DIAGNOSIS — Z79899 Other long term (current) drug therapy: Secondary | ICD-10-CM | POA: Diagnosis not present

## 2019-03-08 NOTE — Progress Notes (Signed)
Patient presents to the office for a nurse visit to have labs done. No questions or concerns. Vitals taken and recorded.   

## 2019-03-09 LAB — BASIC METABOLIC PANEL WITH GFR
BUN: 15 mg/dL (ref 7–25)
CO2: 31 mmol/L (ref 20–32)
Calcium: 9.5 mg/dL (ref 8.6–10.4)
Chloride: 102 mmol/L (ref 98–110)
Creat: 0.7 mg/dL (ref 0.50–0.99)
GFR, Est African American: 105 mL/min/{1.73_m2} (ref >=60)
GFR, Est Non African American: 90 mL/min/{1.73_m2} (ref >=60)
Glucose, Bld: 87 mg/dL (ref 65–99)
Potassium: 4.2 mmol/L (ref 3.5–5.3)
Sodium: 141 mmol/L (ref 135–146)

## 2019-03-31 ENCOUNTER — Other Ambulatory Visit: Payer: Self-pay

## 2019-03-31 ENCOUNTER — Ambulatory Visit
Admission: RE | Admit: 2019-03-31 | Discharge: 2019-03-31 | Disposition: A | Discharge status: Home / Self Care | Payer: Medicare Other | Source: Ambulatory Visit | Attending: Adult Health | Admitting: Adult Health

## 2019-03-31 DIAGNOSIS — Z122 Encounter for screening for malignant neoplasm of respiratory organs: Secondary | ICD-10-CM

## 2019-04-01 ENCOUNTER — Encounter: Payer: Self-pay | Admitting: Adult Health

## 2019-04-01 DIAGNOSIS — I700 Atherosclerosis of aorta: Secondary | ICD-10-CM | POA: Insufficient documentation

## 2019-04-24 ENCOUNTER — Other Ambulatory Visit: Payer: Self-pay

## 2019-04-24 ENCOUNTER — Ambulatory Visit: Payer: Medicare Other | Attending: Internal Medicine

## 2019-04-24 DIAGNOSIS — Z23 Encounter for immunization: Secondary | ICD-10-CM

## 2019-04-24 NOTE — Progress Notes (Signed)
   Covid-19 Vaccination Clinic  Name:  Beth [name]    MRN: [id number] DOB: [birth year]  04/24/2019  Beth [name] was observed post Covid-19 immunization for 15 minutes without incidence. She was provided with Vaccine Information Sheet and instruction to access the V-Safe system.   Beth [name] was instructed to call 911 with any severe reactions post vaccine: Marland Kitchen Difficulty breathing  . Swelling of your face and throat  . A fast heartbeat  . A bad rash all over your body  . Dizziness and weakness    Immunizations Administered    Name Date Dose VIS Date Route   Moderna COVID-19 Vaccine 04/24/2019  1:43 PM 0.5 mL 02/07/2019 Intramuscular   Manufacturer: Moderna   Lot: [id number]   [id number]

## 2019-05-23 ENCOUNTER — Ambulatory Visit: Payer: Medicare Other | Attending: Internal Medicine

## 2019-05-23 DIAGNOSIS — Z23 Encounter for immunization: Secondary | ICD-10-CM

## 2019-05-23 NOTE — Progress Notes (Signed)
   Covid-19 Vaccination Clinic  Name:  Beth [name]    MRN: [id number] DOB: [birth year]  05/23/2019  [name] was observed post Covid-19 immunization for 15 minutes without incident. She was provided with Vaccine Information Sheet and instruction to access the V-Safe system.   [name] was instructed to call 911 with any severe reactions post vaccine: Marland Kitchen Difficulty breathing  . Swelling of face and throat  . A fast heartbeat  . A bad rash all over body  . Dizziness and weakness   Immunizations Administered    Name Date Dose VIS Date Route   Moderna COVID-19 Vaccine 05/23/2019  1:43 PM 0.5 mL 02/07/2019 Intramuscular   Manufacturer: Moderna   Lot: [id number]   [place]:[id number]

## 2019-05-30 ENCOUNTER — Encounter: Payer: Self-pay | Admitting: Certified Nurse Midwife

## 2019-06-22 NOTE — Progress Notes (Signed)
69 y.o. [id number] Divorced White or Caucasian female here for annual exam.  Had a knee reconstruction last June with Dr. Delford Field in [place].  This was ultimately unsuccessful.  Had gall bladder removed in October.  Lost weight several while having gall bladder issues prior to removal.  Also, had back pain.    Denies vaginal bleeding.    PCP:  Dr. Melford Aase.  Had blood work in November.  Patient's last menstrual period was 04/09/2009.          Sexually active: No.  The current method of family planning is post menopausal status.    Exercising: No.  patient doing PT for left knee Smoker:  Yes, .50 pack/day  Health Maintenance: Pap: 12-15-16 neg, 12-05-13 Neg:Neg HR HPV History of abnormal Pap:  no MMG: 02-10-18 Neg/density A/Birads2-Has appt.07-06-19/[facility] Colonoscopy: 2014 polyp;next 5 years--GI aware and waiting until pt recovers from knee surgery BMD:   2018 osteopenia multiple sites--has appt 07-06-19/[facility] TDaP: 03-10-15 Pneumonia vaccine(s):  2019 Shingrix:   2014 Hep C testing: neg in preg Screening Labs: Dr. Melford Aase   reports that she has been smoking cigarettes. She started smoking about 48 years ago. She has a 23.50 pack-year smoking history. She has never used smokeless tobacco. She reports that she does not drink alcohol or use drugs.  Past Medical History:  Diagnosis Date  . Abscessed tooth   . Anemia   . Anxiety   . Arthritis   . Bell's palsy 01/1985  . Complication of anesthesia   . COPD (chronic obstructive pulmonary disease) ([place])   . Depression   . Diabetes mellitus without complication ([place])   . GERD (gastroesophageal reflux disease)   . Hypertension   . IBS (irritable bowel syndrome) 12/89  . Obesity, Class III, BMI 40-49.9 (morbid obesity) ([place])   . OSA (obstructive sleep apnea)    no cpap  . Vitamin D deficiency     Past Surgical History:  Procedure Laterality Date  . BLADDER SUSPENSION  03/2006, 07/2006, 01/2007   for stress incontinence  . CHOLECYSTECTOMY  N/A 12/08/2018   Procedure: LAPAROSCOPIC CHOLECYSTECTOMY;  Surgeon: Coralie Keens, MD;  Location: [facility];  Service: General;  Laterality: N/A;  . COLONOSCOPY  05/2005   Normal, recheck 10 yrs  . JOINT REPLACEMENT     x3 lt knee  . LUMBAR FUSION Right 09/2016   L4-5 fusion by Dr. Sherley Bounds  . TONSILLECTOMY AND ADENOIDECTOMY    . TOTAL KNEE ARTHROPLASTY Left 2012   x 2 left knee  . TOTAL KNEE REVISION  2014  . TOTAL KNEE REVISION Left 08/2018    Current Outpatient Medications  Medication Sig Dispense Refill  . albuterol (VENTOLIN HFA) 108 (90 Base) MCG/ACT inhaler Use 2 inhalations 15 minutes apart every 4 hours as needed to rescue Asthma Attack (Patient taking differently: Inhale 2 puffs into the lungs every 4 (four) hours as needed for wheezing or shortness of breath. ) 48 g 3  . ALPRAZolam (XANAX) 0.25 MG tablet Take 1/2 to 1 tablet 2 x / day if needed for anxiety. (Patient taking differently: Take 0.25 mg by mouth 2 (two) times daily. ) 60 tablet 0  . chlorhexidine (PERIDEX) 0.12 % solution     . docusate sodium (COLACE) 100 MG capsule Take 100 mg by mouth 2 (two) times daily.    . famotidine (PEPCID) 10 MG tablet Take 10 mg by mouth at bedtime.    Marland Kitchen glucose blood (ACCU-CHEK AVIVA PLUS) test strip     .  LINZESS 145 MCG CAPS capsule Take 145 mcg by mouth every morning.    . metoCLOPramide (REGLAN) 10 MG tablet Take 1 tablet 4 x /day before Meals & Bedtime for Acid Reflux 360 tablet 3  . MYRBETRIQ 50 MG TB24 tablet Take 50 mg by mouth daily.    Marland Kitchen olmesartan (BENICAR) 40 MG tablet Take 1 tablet (40 mg total) by mouth daily. 90 tablet 2  . ONETOUCH DELICA LANCETS 99991111 MISC Check blood sugar 1 time daily-[id number] 100 each 4  . ONETOUCH VERIO test strip USE TO CHECK BLOOD SUGAR ONCE DAILY 100 each 3  . pantoprazole (PROTONIX) 40 MG tablet Take 1 tablet every Morning for Acid Indigestion 90 tablet 3  . polyethylene glycol powder (GLYCOLAX/MIRALAX) 17 GM/SCOOP powder Take 17 g by mouth at  bedtime.    . promethazine (PHENERGAN) 25 MG tablet Take 0.5 tablets (12.5 mg total) by mouth every 6 (six) hours as needed. 25 tablet 0  . rosuvastatin (CRESTOR) 10 MG tablet Take 10 mg by mouth daily.    Marland Kitchen venlafaxine XR (EFFEXOR-XR) 150 MG 24 hr capsule Take 150 mg by mouth 2 (two) times daily.     . zaleplon (SONATA) 10 MG capsule Take 10 mg by mouth at bedtime as needed for sleep.     Marland Kitchen acetaminophen (TYLENOL) 500 MG tablet Take 500 mg by mouth as needed.      No current facility-administered medications for this visit.    Family History  Problem Relation Age of Onset  . Hypertension Father   . Diabetes Father   . Parkinson's disease Father   . Dementia Mother   . Heart attack Paternal Grandfather 58       Died MI    Review of Systems  Genitourinary:       Urinary incontinence  All other systems reviewed and are negative. patient has seen her urologist for this problem.  Exam:   BP 134/76 (Cuff Size: Large)   Pulse 80   Temp (!) 96.8 F (36 C) (Temporal)   Resp 14   Ht 5\' 6"  (1.676 m)   Wt 193 lb (87.5 kg)   LMP 04/09/2009   BMI 31.15 kg/m    Height: 5\' 6"  (167.6 cm)  Ht Readings from Last 3 Encounters:  06/26/19 5\' 6"  (1.676 m)  03/31/19 5\' 4"  (1.626 m)  12/08/18 5\' 5"  (1.651 m)   General appearance: alert, cooperative and appears stated age Head: Normocephalic, without obvious abnormality, atraumatic Neck: no adenopathy, supple, symmetrical, trachea midline and thyroid normal to inspection and palpation Lungs: clear to auscultation bilaterally Breasts: normal appearance, no masses or tenderness Heart: regular rate and rhythm Abdomen: soft, non-tender; bowel sounds normal; no masses,  no organomegaly Extremities: extremities normal, atraumatic, no cyanosis or edema Skin: Skin color, texture, turgor normal. No rashes or lesions Lymph nodes: Cervical, supraclavicular, and axillary nodes normal. No abnormal inguinal nodes palpated Neurologic: Grossly  normal   Pelvic: External genitalia:  no lesions              Urethra:  normal appearing urethra with no masses, tenderness or lesions              Bartholins and Skenes: normal                 Vagina: normal appearing vagina with normal color and discharge, no lesions              Cervix: no lesions  Pap taken: Yes.   Bimanual Exam:  Uterus:  enlarged, 8 weeks size              Adnexa: no mass, fullness, tenderness               Rectovaginal: Confirms               Anus:  normal sphincter tone, no lesions  Chaperone, Marisa Sprinkles, CMA, was present for exam.  A:  Well Woman with normal exam PMP, no HRT H/o fibroid uterus, ultrasound 2016 and 2019 with 7 cm fibroid (decreased from >9cm in 2008) Weight loss prior to gall bladder surgery in 2020 Hypertension Diabetes but off medications due to weight loss Elevated lipids Requires walker use due to multiple surgeries on left knee Urinary incontinence, WBCs in urine today  P:   Mammogram guidelines reviewed.  Has appt scheduled later this month pap smear obtained today Aware colonoscopy due.  She is going to schedule it this year as well Vaccines reviewed.  Not ready to update shingrix vaccination.  Has received Zostavax.  I am comfortable with her decision.   Lab work planned next month with Dr. Melford Aase Urine culture pending return annually or prn

## 2019-06-26 ENCOUNTER — Other Ambulatory Visit: Payer: Self-pay

## 2019-06-26 ENCOUNTER — Other Ambulatory Visit (HOSPITAL_COMMUNITY)
Admission: RE | Admit: 2019-06-26 | Discharge: 2019-06-26 | Disposition: A | Discharge status: Home / Self Care | Payer: Medicare Other | Source: Ambulatory Visit | Attending: Obstetrics & Gynecology | Admitting: Obstetrics & Gynecology

## 2019-06-26 ENCOUNTER — Ambulatory Visit (INDEPENDENT_AMBULATORY_CARE_PROVIDER_SITE_OTHER): Payer: Medicare Other | Admitting: Obstetrics & Gynecology

## 2019-06-26 ENCOUNTER — Encounter: Payer: Self-pay | Admitting: Obstetrics & Gynecology

## 2019-06-26 VITALS — BP 134/76 | HR 80 | Temp 96.8°F | Resp 14 | Ht 66.0 in | Wt 193.0 lb

## 2019-06-26 DIAGNOSIS — R82998 Other abnormal findings in urine: Secondary | ICD-10-CM

## 2019-06-26 DIAGNOSIS — Z124 Encounter for screening for malignant neoplasm of cervix: Secondary | ICD-10-CM | POA: Insufficient documentation

## 2019-06-26 DIAGNOSIS — Z01419 Encounter for gynecological examination (general) (routine) without abnormal findings: Secondary | ICD-10-CM | POA: Diagnosis not present

## 2019-06-26 DIAGNOSIS — R350 Frequency of micturition: Secondary | ICD-10-CM | POA: Diagnosis not present

## 2019-06-26 LAB — POCT URINALYSIS DIPSTICK
Bilirubin, UA: NEGATIVE
Blood, UA: NEGATIVE
Glucose, UA: NEGATIVE
Ketones, UA: NEGATIVE
Nitrite, UA: NEGATIVE
Protein, UA: NEGATIVE
Urobilinogen, UA: NEGATIVE E.U./dL — AB
pH, UA: 5 (ref 5.0–8.0)

## 2019-06-26 NOTE — Addendum Note (Signed)
Addended by: Lowella Fairy on: 06/26/2019 12:56 PM   Modules accepted: Orders

## 2019-06-28 ENCOUNTER — Other Ambulatory Visit: Payer: Self-pay

## 2019-06-28 ENCOUNTER — Telehealth: Payer: Self-pay

## 2019-06-28 LAB — CYTOLOGY - PAP
Diagnosis: NEGATIVE
Diagnosis: REACTIVE

## 2019-06-28 MED ORDER — NITROFURANTOIN MONOHYD MACRO 100 MG PO CAPS: 100.0000 mg | ORAL_CAPSULE | Freq: Two times a day (BID) | ORAL | 0 refills | Status: AC

## 2019-06-28 NOTE — Telephone Encounter (Signed)
-----   Message from Megan Salon, MD sent at 06/28/2019  2:07 PM EDT ----- Please let pt know her urine culture is growing e coli.  Would like to treat with macrobid 100 mg bid x 5 days.  Will need repeat urine culture in 2 weeks after treatment.  She is having incontinence and I would like to know if that improves after treatment.  Thanks.

## 2019-06-28 NOTE — Telephone Encounter (Signed)
Results reviewed with patient in detail. Prescription for Macrobid sent to pharmacy and patient scheduled for nurse visit two weeks after the expected day of treatment. Patient agreeable and verbalizes understanding.  Closing encounter.

## 2019-06-28 NOTE — Telephone Encounter (Signed)
Left message for patient to call me back at [phone].

## 2019-06-29 LAB — URINE CULTURE

## 2019-07-04 NOTE — Progress Notes (Signed)
MEDICARE ANNUAL WELLNESS VISIT AND FOLLOW UP   Assessment:    Encounter for Medicare annual wellness exam 1 year  Controlled type 2 diabetes mellitus with chronic kidney disease, without long-term current use of insulin, unspecified CKD stage ([place]) -     Hemoglobin A1c Discussed general issues about diabetes pathophysiology and management., Educational material distributed., Suggested low cholesterol diet., Encouraged aerobic exercise., Discussed foot care., Reminded to get yearly retinal exam.  Aortic atherosclerosis ([place]) Control blood pressure, cholesterol, glucose, increase exercise.   OSA and COPD overlap syndrome (HCC) Sleep apnea- continue CPAP, CPAP is helping with daytime fatigue, weight loss still advised.   Obesity hypoventilation syndrome (HCC) Sleep apnea- continue CPAP, CPAP is helping with daytime fatigue, weight loss still advised.   Chronic obstructive pulmonary disease, unspecified COPD type ([place]) No triggers, well controlled symptoms, cont to monitor  Hyperlipidemia associated with type 2 diabetes mellitus ([place]) -     Lipid panel -     Hemoglobin A1c check lipids decrease fatty foods increase activity.   Morbid obesity ([place]) - BMI 30+ with OSA, T2DM, htn, hyperlipidemia - follow up 3 months for progress monitoring - increase veggies, decrease carbs - long discussion about weight loss, diet, and exercise  Depression, major, in remission ([place]) - continue medications, stress management techniques discussed, increase water, good sleep hygiene discussed, increase exercise, and increase veggies.   Essential hypertension -     CBC with Differential/Platelet -     COMPLETE METABOLIC PANEL WITH GFR -     TSH - continue medications, DASH diet, exercise and monitor at home. Call if greater than 130/80.   Vitamin D deficiency -     VITAMIN D 25 Hydroxy (Vit-D Deficiency, Fractures)  Medication management -     Magnesium  Iron deficiency anemia, unspecified  iron deficiency anemia type -     Ferritin -     Vitamin B12  Irritable bowel syndrome, unspecified type If not on benefiber then add it, decrease stress,  if any worsening symptoms, blood in stool, AB pain, etc call office Follow up Dr. Collene Mares  Gastroesophageal reflux disease, unspecified whether esophagitis present Continue PPI/H2 blocker, diet discussed  Osteopenia of multiple sites Monitor  Seasonal allergies Allergy Continue OTC allergy pills   Over 40 minutes of exam, counseling, chart review and critical decision making was performed Future Appointments  Date Time Provider [place]  07/20/2019  1:45 PM [place] None  11/01/2019  2:00 PM [name], MD [facility] None  11/27/2019  2:00 PM Liane Comber, NP [facility] None  08/12/2020  2:00 PM Megan Salon, MD [place] None     Plan:   During the course of the visit the patient was educated and counseled about appropriate screening and preventive services including:    Pneumococcal vaccine   Prevnar 13  Influenza vaccine  Td vaccine  Screening electrocardiogram  Bone densitometry screening  Colorectal cancer screening  Diabetes screening  Glaucoma screening  Nutrition counseling   Advanced directives: requested   Subjective:  Beth [name] is a 69 y.o. female who presents for Medicare Annual Wellness Visit and 3 month follow up.   Patient was seeing Dr Elta Guadeloupe Phillips/Pain Mgmt over 20+ years for Chronic Pain Syndrome consequent of Lumbar DDD, stopped after last surgery in Jul2018.  She is using a walker due left knee pain, she had total knee revision in June 2020 with Dr. Tracie Harrier but states it was not successful, she has to wear a brace and  use her walker still.   She had gallbladder removed Oct 1st 2020, she has been having diarrhea for several months but now she is having more incomplete evacuation and will have BM after eating. Last colonoscopy was 2014. She is on pepcid  and linzess/protonix, She is on 1/2 of relgan or 5 mg daily. She is having more gas, bloating. Following with Dr. Collene Mares. She is avoiding lactose.   She currently continues to smoke, now down to less than a 1/2 pack a day; discussed risks associated with smoking, patient is ready to quit. Has declined medications thus far.  She has a 47 pack year smoking history.  Low dose CT lung cancer 03/31/19- repeat- had aortic atherosclerosis and COPD.   She has hx of major depression in remission on effexor; she is also prescribed xanax 0.25 mg BID PRN breakthrough anxiety,  and takes sonata at night for sleep.   BMI is Body mass index is 30.54 kg/m., she is still in PT for her knee.  Wt Readings from Last 3 Encounters:  07/05/19 189 lb 3.2 oz (85.8 kg)  06/26/19 193 lb (87.5 kg)  03/31/19 190 lb (86.2 kg)    Her blood pressure has been controlled at home, today their BP is BP: 118/74 She does not workout. She denies chest pain, shortness of breath, dizziness.   She is on cholesterol medication (crestor 10mg , not at goal of 70) and denies myalgias. Her cholesterol is at goal. The cholesterol last visit was:   Lab Results  Component Value Date   CHOL 139 07/05/2019   HDL 54 07/05/2019   LDLCALC 71 07/05/2019   TRIG 63 07/05/2019   CHOLHDL 2.6 07/05/2019    She has not been working on diet and exercise for T2DM currently treated by lifestyle modification,, and denies foot ulcerations, increased appetite, nausea, polydipsia, polyuria and vomiting.  Last A1C in the office was:  Lab Results  Component Value Date   HGBA1C 5.8 (H) 07/05/2019   Last GFR: Lab Results  Component Value Date   [facility] 83 07/05/2019   Patient is on Vitamin D supplement.   Lab Results  Component Value Date   VD25OH 84 07/05/2019      Medication Review:   Current Outpatient Medications (Cardiovascular):  .  olmesartan (BENICAR) 40 MG tablet, Take 1 tablet (40 mg total) by mouth daily. .  rosuvastatin (CRESTOR)  10 MG tablet, Take 10 mg by mouth daily.  Current Outpatient Medications (Respiratory):  .  albuterol (VENTOLIN HFA) 108 (90 Base) MCG/ACT inhaler, Use 2 inhalations 15 minutes apart every 4 hours as needed to rescue Asthma Attack (Patient not taking: Reported on 07/05/2019)    Current Outpatient Medications (Other):  Marland Kitchen  ALPRAZolam (XANAX) 0.25 MG tablet, Take 1/2 to 1 tablet 2 x / day if needed for anxiety. (Patient taking differently: Take 0.25 mg by mouth 2 (two) times daily. ) .  chlorhexidine (PERIDEX) 0.12 % solution,  .  docusate sodium (COLACE) 100 MG capsule, Take 100 mg by mouth 2 (two) times daily. .  famotidine (PEPCID) 10 MG tablet, Take 10 mg by mouth at bedtime. Marland Kitchen  glucose blood (ACCU-CHEK AVIVA PLUS) test strip,  .  LINZESS 145 MCG CAPS capsule, Take 145 mcg by mouth every morning. .  metoCLOPramide (REGLAN) 10 MG tablet, Take 1 tablet 4 x /day before Meals & Bedtime for Acid Reflux .  MYRBETRIQ 50 MG TB24 tablet, Take 50 mg by mouth daily. Glory Rosebush DELICA LANCETS [id number] MISC,  Check blood sugar 1 time daily-[id number] .  ONETOUCH VERIO test strip, USE TO CHECK BLOOD SUGAR ONCE DAILY .  pantoprazole (PROTONIX) 40 MG tablet, Take 1 tablet every Morning for Acid Indigestion .  venlafaxine XR (EFFEXOR-XR) 150 MG 24 hr capsule, Take 150 mg by mouth 2 (two) times daily.  .  zaleplon (SONATA) 10 MG capsule, Take 10 mg by mouth at bedtime as needed for sleep.    Allergies  Allergen Reactions  . Penicillins Anaphylaxis and Rash    Childhood allergy Did it involve swelling of the face/tongue/throat, SOB, or low BP? Yes Did it involve sudden or severe rash/hives, skin peeling, or any reaction on the inside of your mouth or nose? Yes Did you need to seek medical attention at a hospital or doctor's office? Yes When did it last happen?Childhood rxn If all above answers are "NO", may proceed with cephalosporin use.  . Sulfa Antibiotics Shortness Of Breath    Also light headed, rash   . Celebrex [Celecoxib] Other (See Comments)    Elevates Blood Pressure   . Ciprofloxacin Nausea And Vomiting  . Codeine Nausea And Vomiting    And hydrocodone  . Levaquin [Levofloxacin] Nausea And Vomiting  . Motrin Ib [Ibuprofen] Hives  . Prempro [Conj Estrog-Medroxyprogest Ace] Hives    Current Problems (verified) Patient Active Problem List   Diagnosis Date Noted  . Aortic atherosclerosis ([place]) 04/01/2019  . Morbid obesity ([place]) - BMI 30+ with OSA, T2DM, htn, hyperlipidemia 01/25/2019  . S/P laparoscopic cholecystectomy 12/08/2018  . Hyperlipidemia associated with type 2 diabetes mellitus ([place]) 06/27/2018  . FHx: heart disease 02/14/2018  . Osteopenia 11/09/2017  . Seasonal allergies 12/15/2016  . COPD (chronic obstructive pulmonary disease) ([place]) 12/20/2014  . Fibroid uterus 12/20/2014  . Obesity hypoventilation syndrome ([place]) 11/19/2014  . OSA and COPD overlap syndrome ([place]) 11/19/2014  . GERD  11/19/2014  . Medication management 09/27/2013  . 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 Vitamin D deficiency   . Iron deficiency anemia 06/17/2012  . Essential hypertension 06/02/2012  . Depression, major, in remission ([place]) 04/09/1988  . IBS (irritable bowel syndrome) 02/07/1988  . Bell's palsy 01/07/1985    Screening Tests Immunization History  Administered Date(s) Administered  . DT (Pediatric) 05/21/2015  . Influenza, High Dose Seasonal PF 01/26/2019  . Influenza,inj,Quad PF,6+ Mos 12/02/2016  . Influenza,inj,quad, With Preservative 12/09/2015  . Influenza-Unspecified 12/07/2016  . Moderna SARS-COVID-2 Vaccination 04/24/2019, 05/23/2019  . PPD Test 03/21/2013, 05/21/2015  . Pneumococcal Conjugate-13 01/04/2014  . Pneumococcal Polysaccharide-23 03/09/2004, 11/11/2017  . Td 03/09/2004  . Tdap 03/10/2015  . Typhoid Inactivated 05/10/2014  . Zoster 12/22/2012    Preventative care: Last colonoscopy: 10/12/2012 - OVERDUE- offered to give bedside commode Last  mammogram: 02/2018 [facility] appointment Last pap smear/pelvic exam: 12/2016 with GYN DEXA: 01/2017 - osteopenia- going to get with [facility] coming up CT low dose lung screen 03/2019  Prior vaccinations: TD or Tdap: 2017  Influenza: 20 Pneumococcal: 2019 Prevnar13: 2015 COVID vaccines done Shingles/Zostavax: 2014  Names of Other Physician/Practitioners you currently use: 1. [facility] here for primary care 2. Dr. Delman Cheadle, eye doctor, last visit 07/2019 3. Dr. Otila Kluver, dentist, last visit 07/2019  Patient Care Team: [name], MD as PCP - General (Internal Medicine) Juanita Craver, MD as Consulting Physician (Gastroenterology)  SURGICAL HISTORY She  has a past surgical history that includes Colonoscopy (05/2005); Total knee arthroplasty (Left, 2012); Bladder suspension (03/2006, 07/2006, 01/2007); Total  knee revision (2014); Tonsillectomy and adenoidectomy; Lumbar fusion (Right, 09/2016); Cholecystectomy (N/A, 12/08/2018); and Total knee revision (Left, 08/2018). FAMILY HISTORY Her family history includes Dementia in her mother; Diabetes in her father; Heart attack (age of onset: 17) in her paternal grandfather; Hypertension in her father; Parkinson's disease in her father. SOCIAL HISTORY She  reports that she has been smoking cigarettes. She started smoking about 48 years ago. She has a 23.50 pack-year smoking history. She has never used smokeless tobacco. She reports that she does not drink alcohol or use drugs.  MEDICARE WELLNESS OBJECTIVES: Physical activity: Current Exercise Habits: The patient does not participate in regular exercise at present(she is in PT), Exercise limited by: orthopedic condition(s) Cardiac risk factors: Cardiac Risk Factors include: advanced age (>21men, >61 women);dyslipidemia;diabetes mellitus;hypertension;sedentary lifestyle;obesity (BMI >30kg/m2) Depression/mood screen:   Depression screen [facility] 2/9 07/05/2019  Decreased  Interest 0  Down, Depressed, Hopeless 0  PHQ - 2 Score 0  Altered sleeping 0  Tired, decreased energy 0  Change in appetite 0  Feeling bad or failure about yourself  0  Trouble concentrating 0  Moving slowly or fidgety/restless 0  Suicidal thoughts 0  PHQ-9 Score 0    ADLs:  In your present state of health, do you have any difficulty performing the following activities: 07/05/2019 12/01/2018  Hearing? N N  Vision? N N  Difficulty concentrating or making decisions? N N  Walking or climbing stairs? Y Y  Comment - difficulty climbing stairs due to left knee surgery in June  Dressing or bathing? N N  Comment has bath bench -  Doing errands, shopping? N Y  Comment - issues with mobility due to knee surgery in June  Some recent data might be hidden     Cognitive Testing  Alert? Yes  Normal Appearance?Yes  Oriented to person? Yes  Place? Yes   Time? Yes  Recall of three objects?  Yes  Can perform simple calculations? Yes  Displays appropriate judgment?Yes  Can read the correct time from a watch face?Yes  EOL planning: Does Patient Have a Medical Advance Directive?: Yes Type of Advance Directive: Healthcare Power of Attorney, Living will Does patient want to make changes to medical advance directive?: No - Patient declined Copy of [place] in Chart?: No - copy requested  Review of Systems  Constitutional: Negative for malaise/fatigue and weight loss.  HENT: Negative for hearing loss and tinnitus.   Eyes: Negative for blurred vision and double vision.  Respiratory: Negative for cough, sputum production, shortness of breath and wheezing.   Cardiovascular: Negative for chest pain, palpitations, orthopnea, claudication, leg swelling and PND.  Gastrointestinal: Negative for abdominal pain, blood in stool, constipation, diarrhea, heartburn, melena, nausea and vomiting.  Genitourinary: Positive for frequency and urgency. Negative for dysuria, flank pain and  hematuria.  Musculoskeletal: Positive for falls (unsteady gait due to unequal leg lenghts, has done PT). Negative for joint pain and myalgias.  Skin: Negative for rash.  Neurological: Negative for dizziness, tingling, sensory change, weakness and headaches.  Endo/Heme/Allergies: Negative for polydipsia.  Psychiatric/Behavioral: Negative.  Negative for depression, memory loss, substance abuse and suicidal ideas. The patient is not nervous/anxious and does not have insomnia.   All other systems reviewed and are negative.    Objective:     Today's Vitals   07/05/19 1358  BP: 118/74  Pulse: 88  Temp: (!) 97.3 F (36.3 C)  SpO2: 95%  Weight: 189 lb 3.2 oz (85.8 kg)  Height: 5\' 6"  (  1.676 m)  PainSc: 1    Body mass index is 30.54 kg/m. General Appearance: Well nourished, in no apparent distress. Eyes: PERRLA, EOMs, conjunctiva no swelling or erythema Sinuses: No Frontal/maxillary tenderness ENT/Mouth: Ext aud canals clear, TMs without erythema, bulging. No erythema, swelling, or exudate on post pharynx.  Tonsils not swollen or erythematous. Hearing normal.  Neck: Supple, thyroid normal.  Respiratory: Respiratory effort normal, BS equal bilaterally without rales, rhonchi, wheezing or stridor.  Cardio: RRR with no MRGs. Brisk peripheral pulses without edema.  Abdomen: Soft, + BS.  Non tender, no guarding, rebound, hernias, masses. Lymphatics: Non tender without lymphadenopathy.  Musculoskeletal: Full ROM, 5/5 strength, Normal gait with walker Skin: Warm, dry without rashes, lesions, ecchymosis.  Neuro: Cranial nerves intact. No cerebellar symptoms.  Psych: Awake and oriented X 3, normal affect, Insight and Judgment appropriate.   Medicare Attestation I have personally reviewed: The patient's medical and social history Their use of alcohol, tobacco or illicit drugs Their current medications and supplements The patient's functional ability including ADLs,fall risks, home safety  risks, cognitive, and hearing and visual impairment Diet and physical activities Evidence for depression or mood disorders  The patient's weight, height, BMI, and visual acuity have been recorded in the chart.  I have made referrals, counseling, and provided education to the patient based on review of the above and I have provided the patient with a written personalized care plan for preventive services.     Vicie Mutters, PA-C   07/06/2019

## 2019-07-05 ENCOUNTER — Ambulatory Visit (INDEPENDENT_AMBULATORY_CARE_PROVIDER_SITE_OTHER): Payer: Medicare Other | Admitting: Physician Assistant

## 2019-07-05 ENCOUNTER — Other Ambulatory Visit: Payer: Self-pay

## 2019-07-05 ENCOUNTER — Encounter: Payer: Self-pay | Admitting: Physician Assistant

## 2019-07-05 VITALS — BP 118/74 | HR 88 | Temp 97.3°F | Ht 66.0 in | Wt 189.2 lb

## 2019-07-05 DIAGNOSIS — E1122 Type 2 diabetes mellitus with diabetic chronic kidney disease: Secondary | ICD-10-CM

## 2019-07-05 DIAGNOSIS — E662 Morbid (severe) obesity with alveolar hypoventilation: Secondary | ICD-10-CM | POA: Diagnosis not present

## 2019-07-05 DIAGNOSIS — D509 Iron deficiency anemia, unspecified: Secondary | ICD-10-CM

## 2019-07-05 DIAGNOSIS — J449 Chronic obstructive pulmonary disease, unspecified: Secondary | ICD-10-CM | POA: Diagnosis not present

## 2019-07-05 DIAGNOSIS — J302 Other seasonal allergic rhinitis: Secondary | ICD-10-CM

## 2019-07-05 DIAGNOSIS — E785 Hyperlipidemia, unspecified: Secondary | ICD-10-CM

## 2019-07-05 DIAGNOSIS — K589 Irritable bowel syndrome without diarrhea: Secondary | ICD-10-CM

## 2019-07-05 DIAGNOSIS — M8589 Other specified disorders of bone density and structure, multiple sites: Secondary | ICD-10-CM

## 2019-07-05 DIAGNOSIS — Z0001 Encounter for general adult medical examination with abnormal findings: Secondary | ICD-10-CM

## 2019-07-05 DIAGNOSIS — G4733 Obstructive sleep apnea (adult) (pediatric): Secondary | ICD-10-CM | POA: Diagnosis not present

## 2019-07-05 DIAGNOSIS — I700 Atherosclerosis of aorta: Secondary | ICD-10-CM | POA: Diagnosis not present

## 2019-07-05 DIAGNOSIS — I10 Essential (primary) hypertension: Secondary | ICD-10-CM

## 2019-07-05 DIAGNOSIS — K219 Gastro-esophageal reflux disease without esophagitis: Secondary | ICD-10-CM

## 2019-07-05 DIAGNOSIS — E1169 Type 2 diabetes mellitus with other specified complication: Secondary | ICD-10-CM

## 2019-07-05 DIAGNOSIS — F325 Major depressive disorder, single episode, in full remission: Secondary | ICD-10-CM

## 2019-07-05 DIAGNOSIS — R6889 Other general symptoms and signs: Secondary | ICD-10-CM

## 2019-07-05 DIAGNOSIS — Z79899 Other long term (current) drug therapy: Secondary | ICD-10-CM

## 2019-07-05 DIAGNOSIS — Z0000 Encounter for general adult medical examination without abnormal findings: Secondary | ICD-10-CM

## 2019-07-05 DIAGNOSIS — E559 Vitamin D deficiency, unspecified: Secondary | ICD-10-CM

## 2019-07-05 NOTE — Patient Instructions (Addendum)
GENERAL HEALTH GOALS  Know what a healthy weight is for you (roughly BMI <25) and aim to maintain this  Aim for 7+ servings of fruits and vegetables daily  70-80+ fluid ounces of water or unsweet tea for healthy kidneys  Limit to max 1 drink of alcohol per day; avoid smoking/tobacco  Limit animal fats in diet for cholesterol and heart health - choose grass fed whenever available  Avoid highly processed foods, and foods high in saturated/trans fats  Aim for low stress - take time to unwind and care for your mental health  Aim for 150 min of moderate intensity exercise weekly for heart health, and weights twice weekly for bone health  Aim for 7-9 hours of sleep daily   Ask insurance and pharmacy about shingrix - it is a 2 part shot that we will not be getting in the office.   Suggest getting AFTER covid vaccines, have to wait at least a month This shot can make you feel bad due to such good immune response it can trigger some inflammation so take tylenol or aleve day of or day after and plan on resting.   Can go to https://www.cdc.gov/vaccines/vpd/shingles/public/shingrix/index.html for more information  Shingrix Vaccination  Two vaccines are licensed and recommended to prevent shingles in the [place] Zoster vaccine live (ZVL, Zostavax) has been in use since 2006. Recombinant zoster vaccine (RZV, Shingrix), has been in use since 2017 and is recommended by [employer] as the preferred shingles vaccine.  What Everyone Should Know about Shingles Vaccine (Shingrix) One of the Recommended Vaccines by Disease Shingles vaccination is the only way to protect against shingles and postherpetic neuralgia (PHN), the most common complication from shingles. CDC recommends that healthy adults 50 years and older get two doses of the shingles vaccine called Shingrix (recombinant zoster vaccine), separated by 2 to 6 months, to prevent shingles and the complications from the disease. Your doctor or pharmacist  can give you Shingrix as a shot in your upper arm. Shingrix provides strong protection against shingles and PHN. Two doses of Shingrix is more than 90% effective at preventing shingles and PHN. Protection stays above 85% for at least the first four years after you get vaccinated. Shingrix is the preferred vaccine, over Zostavax (zoster vaccine live), a shingles vaccine in use since 2006. Zostavax may still be used to prevent shingles in healthy adults 60 years and older. For example, you could use Zostavax if a person is allergic to Shingrix, prefers Zostavax, or requests immediate vaccination and Shingrix is unavailable. Who Should Get Shingrix? Healthy adults 50 years and older should get two doses of Shingrix, separated by 2 to 6 months. You should get Shingrix even if in the past you . had shingles  . received Zostavax  . are not sure if you had chickenpox There is no maximum age for getting Shingrix. If you had shingles in the past, you can get Shingrix to help prevent future occurrences of the disease. There is no specific length of time that you need to wait after having shingles before you can receive Shingrix, but generally you should make sure the shingles rash has gone away before getting vaccinated. You can get Shingrix whether or not you remember having had chickenpox in the past. Studies show that more than 99% of Americans 40 years and older have had chickenpox, even if they don't remember having the disease. Chickenpox and shingles are related because they are caused by the same virus (varicella zoster virus). After a person   recovers from chickenpox, the virus stays dormant (inactive) in the body. It can reactivate years later and cause shingles. If you had Zostavax in the recent past, you should wait at least eight weeks before getting Shingrix. Talk to your healthcare provider to determine the best time to get Shingrix. Shingrix is available in doctor's offices and pharmacies. To find  doctor's offices or pharmacies near you that offer the vaccine, visit [facility] Vaccine FinderExternal. If you have questions about Shingrix, talk with your healthcare provider. Vaccine for Those 50 Years and Older  Shingrix reduces the risk of shingles and PHN by more than 90% in people 50 and older. CDC recommends the vaccine for healthy adults 50 and older.  Who Should Not Get Shingrix? You should not get Shingrix if you: . have ever had a severe allergic reaction to any component of the vaccine or after a dose of Shingrix  . tested negative for immunity to varicella zoster virus. If you test negative, you should get chickenpox vaccine.  . currently have shingles  . currently are pregnant or breastfeeding. Women who are pregnant or breastfeeding should wait to get Shingrix.  . receive specific antiviral drugs (acyclovir, famciclovir, or valacyclovir) 24 hours before vaccination (avoid use of these antiviral drugs for 14 days after vaccination)- zoster vaccine live only If you have a minor acute (starts suddenly) illness, such as a cold, you may get Shingrix. But if you have a moderate or severe acute illness, you should usually wait until you recover before getting the vaccine. This includes anyone with a temperature of 101.3F or higher. The side effects of the Shingrix are temporary, and usually last 2 to 3 days. While you may experience pain for a few days after getting Shingrix, the pain will be less severe than having shingles and the complications from the disease. How Well Does Shingrix Work? Two doses of Shingrix provides strong protection against shingles and postherpetic neuralgia (PHN), the most common complication of shingles. . In adults 50 to 67 years old who got two doses, Shingrix was 97% effective in preventing shingles; among adults 70 years and older, Shingrix was 91% effective.  . In adults 50 to 67 years old who got two doses, Shingrix was 91% effective in preventing PHN;  among adults 70 years and older, Shingrix was 89% effective. Shingrix protection remained high (more than 85%) in people 70 years and older throughout the four years following vaccination. Since your risk of shingles and PHN increases as you get older, it is important to have strong protection against shingles in your older years. Top of Page  What Are the Possible Side Effects of Shingrix? Studies show that Shingrix is safe. The vaccine helps your body create a strong defense against shingles. As a result, you are likely to have temporary side effects from getting the shots. The side effects may affect your ability to do normal daily activities for 2 to 3 days. Most people got a sore arm with mild or moderate pain after getting Shingrix, and some also had redness and swelling where they got the shot. Some people felt tired, had muscle pain, a headache, shivering, fever, stomach pain, or nausea. About 1 out of 6 people who got Shingrix experienced side effects that prevented them from doing regular activities. Symptoms went away on their own in about 2 to 3 days. Side effects were more common in younger people. You might have a reaction to the first or second dose of Shingrix, or both doses.   If you experience side effects, you may choose to take over-the-counter pain medicine such as ibuprofen or acetaminophen. If you experience side effects from Shingrix, you should report them to the Vaccine Adverse Event Reporting System (VAERS). Your doctor might file this report, or you can do it yourself through the VAERS websiteExternal, or by calling 1-800-822-7967. If you have any questions about side effects from Shingrix, talk with your doctor. The shingles vaccine does not contain thimerosal (a preservative containing mercury). Top of Page  When Should I See a Doctor Because of the Side Effects I Experience From Shingrix? In clinical trials, Shingrix was not associated with serious adverse events. In fact,  serious side effects from vaccines are extremely rare. For example, for every 1 million doses of a vaccine given, only one or two people may have a severe allergic reaction. Signs of an allergic reaction happen within minutes or hours after vaccination and include hives, swelling of the face and throat, difficulty breathing, a fast heartbeat, dizziness, or weakness. If you experience these or any other life-threatening symptoms, see a doctor right away. Shingrix causes a strong response in your immune system, so it may produce short-term side effects more intense than you are used to from other vaccines. These side effects can be uncomfortable, but they are expected and usually go away on their own in 2 or 3 days. Top of Page  How Can I Pay For Shingrix? There are several ways shingles vaccine may be paid for: Medicare . Medicare Part D plans cover the shingles vaccine, but there may be a cost to you depending on your plan. There may be a copay for the vaccine, or you may need to pay in full then get reimbursed for a certain amount.  . Medicare Part B does not cover the shingles vaccine. Medicaid . Medicaid may or may not cover the vaccine. Contact your insurer to find out. Private health insurance . Many private health insurance plans will cover the vaccine, but there may be a cost to you depending on your plan. Contact your insurer to find out. Vaccine assistance programs . Some pharmaceutical companies provide vaccines to eligible adults who cannot afford them. You may want to check with the vaccine manufacturer, [employer], about Shingrix. If you do not currently have health insurance, learn more about affordable health coverage optionsExternal. To find doctor's offices or pharmacies near you that offer the vaccine, visit HealthMap Vaccine FinderExternal.  

## 2019-07-06 LAB — COMPLETE METABOLIC PANEL WITH GFR
AG Ratio: 1.3 (calc) (ref 1.0–2.5)
ALT: 30 U/L — ABNORMAL HIGH (ref 6–29)
AST: 39 U/L — ABNORMAL HIGH (ref 10–35)
Albumin: 3.9 g/dL (ref 3.6–5.1)
Alkaline phosphatase (APISO): 217 U/L — ABNORMAL HIGH (ref 37–153)
BUN: 16 mg/dL (ref 7–25)
CO2: 30 mmol/L (ref 20–32)
Calcium: 9.7 mg/dL (ref 8.6–10.4)
Chloride: 102 mmol/L (ref 98–110)
Creat: 0.75 mg/dL (ref 0.50–0.99)
GFR, Est African American: 96 mL/min/{1.73_m2} (ref >=60)
GFR, Est Non African American: 83 mL/min/{1.73_m2} (ref >=60)
Globulin: 2.9 g/dL (calc) (ref 1.9–3.7)
Glucose, Bld: 82 mg/dL (ref 65–99)
Potassium: 3.9 mmol/L (ref 3.5–5.3)
Sodium: 139 mmol/L (ref 135–146)
Total Bilirubin: 0.4 mg/dL (ref 0.2–1.2)
Total Protein: 6.8 g/dL (ref 6.1–8.1)

## 2019-07-06 LAB — LIPID PANEL
Cholesterol: 139 mg/dL (ref <200)
HDL: 54 mg/dL (ref >=50)
LDL Cholesterol (Calc): 71 mg/dL (calc)
Non-HDL Cholesterol (Calc): 85 mg/dL (calc) (ref <130)
Total CHOL/HDL Ratio: 2.6 (calc) (ref <5.0)
Triglycerides: 63 mg/dL (ref <150)

## 2019-07-06 LAB — CBC WITH DIFFERENTIAL/PLATELET
Absolute Monocytes: 460 cells/uL (ref 200–950)
Basophils Absolute: 47 cells/uL (ref 0–200)
Basophils Relative: 0.8 %
Eosinophils Absolute: 277 cells/uL (ref 15–500)
Eosinophils Relative: 4.7 %
HCT: 37.6 % (ref 35.0–45.0)
Hemoglobin: 12.5 g/dL (ref 11.7–15.5)
Lymphs Abs: 1994 cells/uL (ref 850–3900)
MCH: 28.5 pg (ref 27.0–33.0)
MCHC: 33.2 g/dL (ref 32.0–36.0)
MCV: 85.8 fL (ref 80.0–100.0)
MPV: 10.9 fL (ref 7.5–12.5)
Monocytes Relative: 7.8 %
Neutro Abs: 3121 cells/uL (ref 1500–7800)
Neutrophils Relative %: 52.9 %
Platelets: 250 10*3/uL (ref 140–400)
RBC: 4.38 10*6/uL (ref 3.80–5.10)
RDW: 13.8 % (ref 11.0–15.0)
Total Lymphocyte: 33.8 %
WBC: 5.9 10*3/uL (ref 3.8–10.8)

## 2019-07-06 LAB — MAGNESIUM: Magnesium: 2.1 mg/dL (ref 1.5–2.5)

## 2019-07-06 LAB — HEMOGLOBIN A1C
Hgb A1c MFr Bld: 5.8 % of total Hgb — ABNORMAL HIGH (ref <5.7)
Mean Plasma Glucose: 120 (calc)
eAG (mmol/L): 6.6 (calc)

## 2019-07-06 LAB — VITAMIN B12: Vitamin B-12: 322 pg/mL (ref 200–1100)

## 2019-07-06 LAB — VITAMIN D 25 HYDROXY (VIT D DEFICIENCY, FRACTURES): Vit D, 25-Hydroxy: 84 ng/mL (ref 30–100)

## 2019-07-06 LAB — TSH: TSH: 1.88 mIU/L (ref 0.40–4.50)

## 2019-07-06 LAB — FERRITIN: Ferritin: 48 ng/mL (ref 16–288)

## 2019-07-17 LAB — HM DEXA SCAN

## 2019-07-18 LAB — HM DIABETES EYE EXAM

## 2019-07-19 ENCOUNTER — Other Ambulatory Visit: Payer: Self-pay | Admitting: Obstetrics & Gynecology

## 2019-07-19 ENCOUNTER — Other Ambulatory Visit: Payer: Self-pay

## 2019-07-19 ENCOUNTER — Ambulatory Visit (INDEPENDENT_AMBULATORY_CARE_PROVIDER_SITE_OTHER): Payer: Medicare Other | Admitting: *Deleted

## 2019-07-19 VITALS — BP 110/68 | HR 78 | Temp 98.1°F | Resp 14 | Ht 64.0 in | Wt 184.1 lb

## 2019-07-19 DIAGNOSIS — R32 Unspecified urinary incontinence: Secondary | ICD-10-CM | POA: Diagnosis not present

## 2019-07-19 NOTE — Progress Notes (Signed)
Patient here for TOC UC for E. Coli. Patient states she completed macrobid as prescribed. Patient denies urinary symptoms today. States she does not feel antibiotic helped with the incontinence. States that is an "on-going problem." Clean catch urine provided and sent for UC. Patient advised would be updated once results back and reviewed by Dr. Sabra Heck. Patient agreeable.   Routing to provider and will close encounter.

## 2019-07-19 NOTE — Progress Notes (Signed)
Message sent to triage to call pt and offering medication treatment initially.  May ultimately need urodynamics but would start with medication initially.  Thanks for update.  Ok to close encounter.

## 2019-07-20 ENCOUNTER — Telehealth: Payer: Self-pay

## 2019-07-20 ENCOUNTER — Ambulatory Visit: Payer: Self-pay

## 2019-07-20 NOTE — Telephone Encounter (Signed)
Call placed to pt.  Spoke with pt. Pt given recommendations per Dr Sabra Heck. Pt states has been on 50 mg of Myrbetriq daily that was prescribed by Dr Matilde Sprang since 09/2018 after having bladder sx. Pt states taking medication has improved incotinenence sx , but not resolved. Was told by Dr Matilde Sprang that she was unable to have more bladder sx. Would like advice or recommendations from Dr Sabra Heck on what is next steps in plan of care.  Advised will update Dr Sabra Heck and return call to pt with additional or further recommendations. Pt agreeable.   Routing to Dr Sabra Heck

## 2019-07-20 NOTE — Telephone Encounter (Signed)
-----   Message from Megan Salon, MD sent at 07/19/2019 12:18 PM EDT ----- Regarding: incontinence Patient was seen in April for annual exam.  She described worsening incontinence to me that visit.  She had white cells in her urine.  Urine culture was positive and she was treated.  I did not want to start any medication until her cystitis was fully treated.  She came in for test of cure urine today and reported to [facility] that her incontinence is no better.  Could you please call her and offer Myrbetriq 25 mg daily.  She does need to be aware of hypertension risk with this medication and needs to be able to check her blood pressure in 1 week.  I would then want a recheck in 4 weeks with her.  This could be a web-based visit if that worked.  It does look like Myrbetriq is covered for her as well.

## 2019-07-21 LAB — URINE CULTURE

## 2019-07-25 NOTE — Telephone Encounter (Signed)
Spoke with pt. Pt given recommendations per Dr Sabra Heck. Pt states has appt with Dr Matilde Sprang in a couple of weeks and will discuss with him about plan of care. Pt declines pelvic PT at this time. Pt states did that years ago and it only helped a little. Pt understands about not fully resolving with medications. Thankful for Dr Ammie Ferrier advice.  Routing to Dr Sabra Heck for review.  Encounter closed.

## 2019-07-25 NOTE — Telephone Encounter (Signed)
Symptoms are not likely to fully resolve.  Can consider changing medication if she wants but what is is on has the least number of side effects.  If Dr. Matilde Sprang advised no additional surgery, then medication is the only option we have to use in addition to physical therapy.  I'd be happy to refer for pelvic PT if she would be interested in trying this as well.

## 2019-08-01 LAB — HM MAMMOGRAPHY

## 2019-08-02 ENCOUNTER — Telehealth: Payer: Self-pay

## 2019-08-02 ENCOUNTER — Encounter: Payer: Self-pay | Admitting: Obstetrics & Gynecology

## 2019-08-02 NOTE — Telephone Encounter (Signed)
Beth Franco J "Yvana jane"  [facility]  Phone Number: [phone]  Have you received the results of my bone density scan on 5/10th?[employer] asked [facility] for the results but they said they send it to care provider to discuss with patient.  I had it and mammogram on 5/10th and asked it to be sent to you and Dr. Melford Aase.  I've also sent him this message.  Thanks,  Joslyn Devon

## 2019-08-02 NOTE — Telephone Encounter (Signed)
Copy of 08/01/19 left breast Dx MMG report received from [place].  Placed on Dr. Ammie Ferrier desk for review.  MMG recall placed.    Dr. Sabra Heck -have you received BMD results?

## 2019-08-03 NOTE — Telephone Encounter (Signed)
Copy of 07/17/19 BMD to Dr. Sabra Heck to review.

## 2019-08-03 NOTE — Telephone Encounter (Signed)
Call placed to [facility], requested copy of BMD dated 07/17/19 be faxed to [facility].

## 2019-08-04 ENCOUNTER — Encounter: Payer: Self-pay | Admitting: *Deleted

## 2019-08-04 NOTE — Telephone Encounter (Signed)
Call placed to patient, left detailed message, ok per dpr, name identified on voicemail. Advised of 07/17/19 BMD results per Dr. Sabra Heck. Return call to office if any additional questions.    Copy of report to scan.   Encounter closed.

## 2019-09-02 ENCOUNTER — Other Ambulatory Visit: Payer: Self-pay | Admitting: Adult Health

## 2019-09-02 DIAGNOSIS — E782 Mixed hyperlipidemia: Secondary | ICD-10-CM

## 2019-09-02 DIAGNOSIS — E1122 Type 2 diabetes mellitus with diabetic chronic kidney disease: Secondary | ICD-10-CM

## 2019-10-31 ENCOUNTER — Encounter: Payer: Self-pay | Admitting: Internal Medicine

## 2019-10-31 NOTE — Progress Notes (Signed)
Annual Screening/Preventative Visit & Comprehensive Evaluation &  Examination     This very nice 69 y.o.  DWF presents for a Screening /Preventative Visit & comprehensive evaluation and management of multiple medical co-morbidities.  Patient has been followed for HTN, HLD, T2_NIDDM  and Vitamin D Deficiency. Patient has hx/o GERD controlled on her meds (Pepcid, Reglan & Protonix). Patient has hx/o Major Depression controlled on her meds. Patient has OSA/on CPAPwith improved Sleep Hygiene & also Morbid Obesity & Obesity overlap syndrome.       Patient  is followed by Dr Nicholaus Bloom & Dr Mina Marble (for [facility])  for a Chronic Pain Syndrome consequent of Lumbar DDD.       HTN predates since 2007. 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  Patient's BP has been controlled at home and patient denies any cardiac symptoms as chest pain, palpitations, shortness of breath, dizziness or ankle swelling. Today's BP is low at 96/60       Patient's hyperlipidemia is controlled with diet and medications. Patient denies myalgias or other medication SE's. Last lipids were at goal:  Lab Results  Component Value Date   CHOL 139 07/05/2019   HDL 54 07/05/2019   LDLCALC 71 07/05/2019   TRIG 63 07/05/2019   CHOLHDL 2.6 07/05/2019       Patient has Morbid Obesity (BMI 43+) and consequent T2_NIDDM  (2008) and CKD2 (GFR 83)  - attempting control by diet.  Patient denies reactive hypoglycemic symptoms, visual blurring, diabetic polys or paresthesias. Last A1c was not at goal:  Lab Results  Component Value Date   HGBA1C 5.8 (H) 07/05/2019   Wt Readings from Last 3 Encounters:  11/01/19 177 lb 3.2 oz (80.4 kg)  07/19/19 184 lb 1.6 oz (83.5 kg)  07/05/19 189 lb 3.2 oz (85.8 kg)   Patien has lost 90# over the last 18 monts with better diet       Finally, patient has history of Vitamin D Deficiency  ("13" / 2008) and last Vitamin D was at goal:  Lab Results  Component Value Date   VD25OH 84 07/05/2019    Current  Outpatient Medications on File Prior to Visit  Medication Sig  . albuterol (VENTOLIN HFA) 108 (90 Base) MCG/ACT inhaler Use 2 inhalations 15 minutes apart every 4 hours as needed to rescue Asthma Attack  . ALPRAZolam (XANAX) 0.25 MG tablet Take 1/2 to 1 tablet 2 x / day if needed for anxiety. (Patient taking differently: Take 0.25 mg by mouth 2 (two) times daily. )  . docusate sodium (COLACE) 100 MG capsule Take 100 mg by mouth 2 (two) times daily.  . famotidine (PEPCID) 10 MG tablet Take 10 mg by mouth at bedtime.  Marland Kitchen glucose blood (ACCU-CHEK AVIVA PLUS) test strip   . LINZESS 145 MCG CAPS capsule Take 145 mcg by mouth every morning.  . metoCLOPramide (REGLAN) 10 MG tablet Take 1 tablet 4 x /day before Meals & Bedtime for Acid Reflux  . olmesartan (BENICAR) 40 MG tablet Take 1 tablet (40 mg total) by mouth daily.  Glory Rosebush DELICA LANCETS 50V MISC Check blood sugar 1 time daily-[id number]  . ONETOUCH VERIO test strip USE TO CHECK BLOOD SUGAR ONCE DAILY  . pantoprazole (PROTONIX) 40 MG tablet Take 1 tablet every Morning for Acid Indigestion  . rosuvastatin (CRESTOR) 10 MG tablet Take 1 tablet Daily for Cholesterol  . venlafaxine XR (EFFEXOR-XR) 150 MG 24 hr capsule Take 150 mg by mouth 2 (two) times daily.   . zaleplon (  SONATA) 10 MG capsule Take 10 mg by mouth at bedtime as needed for sleep.    No current facility-administered medications on file prior to visit.   Allergies  Allergen Reactions  . Penicillins Anaphylaxis and Rash       . Sulfa Antibiotics Shortness Of Breath    Also light headed, rash  . Celebrex [Celecoxib] Other (See Comments)    Elevates Blood Pressure   . Ciprofloxacin Nausea And Vomiting  . Codeine Nausea And Vomiting    And hydrocodone  . Levaquin [Levofloxacin] Nausea And Vomiting  . Motrin Ib [Ibuprofen] Hives  . Prempro [Conj Estrog-Medroxyprogest Ace] Hives   Past Medical History:  Diagnosis Date  . Abscessed tooth   . Anemia   . Anxiety   .  Arthritis   . Bell's palsy 01/1985  . Complication of anesthesia   . COPD (chronic obstructive pulmonary disease) ([place])   . Depression   . Diabetes mellitus without complication ([place])   . GERD (gastroesophageal reflux disease)   . Hypertension   . IBS (irritable bowel syndrome) 12/89  . Obesity, Class III, BMI 40-49.9 (morbid obesity) ([place])   . OSA (obstructive sleep apnea)    no cpap  . Vitamin D deficiency    Health Maintenance  Topic Date Due  . COLONOSCOPY  10/12/2017  . INFLUENZA VACCINE  10/08/2019  . HEMOGLOBIN A1C  01/04/2020  . OPHTHALMOLOGY EXAM  07/17/2020  . FOOT EXAM  10/30/2020  . MAMMOGRAM  07/31/2021  . TETANUS/TDAP  05/20/2025  . DEXA SCAN  Completed  . COVID-19 Vaccine  Completed  . Hepatitis C Screening  Completed  . PNA vac Low Risk Adult  Completed   Immunization History  Administered Date(s) Administered  . DT (Pediatric) 05/21/2015  . Influenza, High Dose Seasonal PF 01/26/2019  . Influenza,inj,Quad PF,6+ Mos 12/02/2016  . Influenza,inj,quad, With Preservative 12/09/2015  . Influenza-Unspecified 12/07/2016  . Moderna SARS-COVID-2 Vaccination 04/24/2019, 05/23/2019  . PPD Test 03/21/2013, 05/21/2015  . Pneumococcal Conjugate-13 01/04/2014  . Pneumococcal Polysaccharide-23 03/09/2004, 11/11/2017  . Td 03/09/2004  . Tdap 03/10/2015  . Typhoid Inactivated 05/10/2014  . Zoster 12/22/2012    Last Colon - 10/12/2012 - Dr Collene Mares - Recc 10 yr f/u - due Aug 2024  Last MGM - 08/01/19  Past Surgical History:  Procedure Laterality Date  . BLADDER SUSPENSION  03/2006, 07/2006, 01/2007   for stress incontinence  . CHOLECYSTECTOMY N/A 12/08/2018   Procedure: LAPAROSCOPIC CHOLECYSTECTOMY;  Surgeon: Coralie Keens, MD;  Location: [place];  Service: General;  Laterality: N/A;  . COLONOSCOPY  05/2005   Normal, recheck 10 yrs  . LUMBAR FUSION Right 09/2016   L4-5 fusion by Dr. Sherley Bounds  . TONSILLECTOMY AND ADENOIDECTOMY    . TOTAL KNEE ARTHROPLASTY Left  2012   x 2 left knee  . TOTAL KNEE REVISION  2014  . TOTAL KNEE REVISION Left 08/2018   Dr Tracie Harrier, [facility] [place]   Family History  Problem Relation Age of Onset  . Hypertension Father   . Diabetes Father   . Parkinson's disease Father   . Dementia Mother   . Heart attack Paternal Grandfather 49       Died MI   Social History   Tobacco Use  . Smoking status: Current Every Day Smoker    Packs/day: 0.50    Years: 47.00    Pack years: 23.50    Types: Cigarettes    Start date: 03/10/1971  . Smokeless tobacco: Never Used  . Tobacco  comment: 1 pack/day from age [age]  Vaping Use  . Vaping Use: Never used  Substance Use Topics  . Alcohol use: No  . Drug use: No    ROS Constitutional: Denies fever, chills, weight loss/gain, headaches, insomnia,  night sweats, and change in appetite. Does c/o fatigue. Eyes: Denies redness, blurred vision, diplopia, discharge, itchy, watery eyes.  ENT: Denies discharge, congestion, post nasal drip, epistaxis, sore throat, earache, hearing loss, dental pain, Tinnitus, Vertigo, Sinus pain, snoring.  Cardio: Denies chest pain, palpitations, irregular heartbeat, syncope, dyspnea, diaphoresis, orthopnea, PND, claudication, edema Respiratory: denies cough, dyspnea, DOE, pleurisy, hoarseness, laryngitis, wheezing.  Gastrointestinal: Denies dysphagia, heartburn, reflux, water brash, pain, cramps, nausea, vomiting, bloating, diarrhea, constipation, hematemesis, melena, hematochezia, jaundice, hemorrhoids Genitourinary: Denies dysuria, frequency, urgency, nocturia, hesitancy, discharge, hematuria, flank pain Breast: Breast lumps, nipple discharge, bleeding.  Musculoskeletal: Denies arthralgia, myalgia, stiffness, Jt. Swelling, pain, limp, and strain/sprain. Denies falls. Skin: Denies puritis, rash, hives, warts, acne, eczema, changing in skin lesion Neuro: No weakness, tremor, incoordination, spasms, paresthesia, pain Psychiatric: Denies confusion, memory  loss, sensory loss. Denies Depression. Endocrine: Denies change in weight, skin, hair change, nocturia, and paresthesia, diabetic polys, visual blurring, hyper / hypo glycemic episodes.  Heme/Lymph: No excessive bleeding, bruising, enlarged lymph nodes.  Physical Exam  BP 96/60   Pulse 60   Temp (!) 97.1 F (36.2 C)   Resp 16   Ht 5\' 5"  (1.651 m)   Wt 177 lb 3.2 oz (80.4 kg)   LMP 04/09/2009   BMI 29.49 kg/m    Postural Sit      BP      96/56   P 55             &           Stand BP     94/50      P 94    General Appearance:  Over nourished, well groomed and in no apparent distress.  Eyes: PERRLA, EOMs, conjunctiva no swelling or erythema, normal fundi and vessels. Sinuses: No frontal/maxillary tenderness ENT/Mouth: EACs patent / TMs  nl. Nares clear without erythema, swelling, mucoid exudates. Oral hygiene is good. No erythema, swelling, or exudate. Tongue normal, non-obstructing. Tonsils not swollen or erythematous. Hearing normal.  Neck: Supple, thyroid not palpable. No bruits, nodes or JVD. Respiratory: Respiratory effort normal.  BS equal and clear bilateral without rales, rhonci, wheezing or stridor. Cardio: Heart sounds are normal with regular rate and rhythm and no murmurs, rubs or gallops. Peripheral pulses are normal and equal bilaterally without edema. No aortic or femoral bruits. Chest: symmetric with normal excursions and percussion. Breasts: Symmetric, without lumps, nipple discharge, retractions, or fibrocystic changes.  Abdomen: Flat, soft with bowel sounds active. Nontender, no guarding, rebound, hernias, masses, or organomegaly.  Lymphatics: Non tender without lymphadenopathy.  Genitourinary:  Musculoskeletal: Full ROM all peripheral extremities, joint stability, 5/5 strength, and normal gait. Skin: Warm and dry without rashes, lesions, cyanosis, clubbing or  ecchymosis.  Neuro: Cranial nerves intact, reflexes equal bilaterally. Normal muscle tone, no cerebellar  symptoms. Sensation intact.  Pysch: Alert and oriented X 3, normal affect, Insight and Judgment appropriate.   Assessment and Plan  1. Annual Preventative Screening Examination   2. Morbid obesity ([place]) - BMI 30+ with OSA, T2DM, Htn, hyperlipidemia   3. Essential hypertension  - Advised stop her Olmesartan 20 mg and monitor BP's & call if Abnormal.  - EKG 12-Lead - Urinalysis, Routine w reflex microscopic - Microalbumin / creatinine urine  ratio - CBC with Differential/Platelet - COMPLETE METABOLIC PANEL WITH GFR - Magnesium - TSH  4. Hyperlipidemia associated with type 2 diabetes mellitus ([place])  - EKG 12-Lead - Lipid panel - TSH  5. Controlled type 2 diabetes mellitus with stage 2 chronic  kidney disease, without long-term current use of insulin ([facility])  - EKG 12-Lead - HM DIABETES FOOT EXAM - LOW EXTREMITY NEUR EXAM DOCUM - Hemoglobin A1c - Insulin, random  6. Vitamin D deficiency  - VITAMIN D 25 Hydroxy  7. OSA and COPD overlap syndrome ([place])  8. Obesity hypoventilation syndrome ([place])   9. Depression, major, in remission ([place])  10. Aortic atherosclerosis ([facility]) - EKG 12-Lead - Lipid panel  11. Gastroesophageal reflux disease  - CBC with Differential/Platelet  12. Screening for ischemic heart disease  - EKG 12-Lead - Lipid panel  13. Smoker - EKG 12-Lead  14. Family history of ischemic heart disease - EKG 12-Lead - Lipid panel  15. Medication management  - Urinalysis, Routine w reflex microscopic - Microalbumin / creatinine urine ratio - CBC with Differential/Platelet - COMPLETE METABOLIC PANEL WITH GFR - Magnesium - Lipid panel - TSH - Hemoglobin A1c - Insulin, random - VITAMIN D 25 Hydroxy   16. Screening for colorectal cancer  - POC Hemoccult Bld/Stl          Patient was counseled in prudent diet to achieve/maintain BMI less than 25 for weight control, BP monitoring, regular exercise and medications. Discussed med's effects and  SE's. Screening labs and tests as requested with regular follow-up as recommended. Over 40 minutes of exam, counseling, chart review and high complex critical decision making was performed.   Kirtland Bouchard, MD

## 2019-10-31 NOTE — Patient Instructions (Signed)

## 2019-11-01 ENCOUNTER — Other Ambulatory Visit: Payer: Self-pay

## 2019-11-01 ENCOUNTER — Ambulatory Visit (INDEPENDENT_AMBULATORY_CARE_PROVIDER_SITE_OTHER): Payer: Medicare Other | Admitting: Internal Medicine

## 2019-11-01 VITALS — BP 96/60 | HR 60 | Temp 97.1°F | Resp 16 | Ht 65.0 in | Wt 177.2 lb

## 2019-11-01 DIAGNOSIS — I700 Atherosclerosis of aorta: Secondary | ICD-10-CM

## 2019-11-01 DIAGNOSIS — E662 Morbid (severe) obesity with alveolar hypoventilation: Secondary | ICD-10-CM

## 2019-11-01 DIAGNOSIS — Z1211 Encounter for screening for malignant neoplasm of colon: Secondary | ICD-10-CM

## 2019-11-01 DIAGNOSIS — I10 Essential (primary) hypertension: Secondary | ICD-10-CM | POA: Diagnosis not present

## 2019-11-01 DIAGNOSIS — Z8249 Family history of ischemic heart disease and other diseases of the circulatory system: Secondary | ICD-10-CM | POA: Diagnosis not present

## 2019-11-01 DIAGNOSIS — Z0000 Encounter for general adult medical examination without abnormal findings: Secondary | ICD-10-CM

## 2019-11-01 DIAGNOSIS — E1169 Type 2 diabetes mellitus with other specified complication: Secondary | ICD-10-CM

## 2019-11-01 DIAGNOSIS — E559 Vitamin D deficiency, unspecified: Secondary | ICD-10-CM

## 2019-11-01 DIAGNOSIS — Z136 Encounter for screening for cardiovascular disorders: Secondary | ICD-10-CM

## 2019-11-01 DIAGNOSIS — G4733 Obstructive sleep apnea (adult) (pediatric): Secondary | ICD-10-CM

## 2019-11-01 DIAGNOSIS — E1122 Type 2 diabetes mellitus with diabetic chronic kidney disease: Secondary | ICD-10-CM

## 2019-11-01 DIAGNOSIS — Z0001 Encounter for general adult medical examination with abnormal findings: Secondary | ICD-10-CM

## 2019-11-01 DIAGNOSIS — K219 Gastro-esophageal reflux disease without esophagitis: Secondary | ICD-10-CM

## 2019-11-01 DIAGNOSIS — N182 Chronic kidney disease, stage 2 (mild): Secondary | ICD-10-CM

## 2019-11-01 DIAGNOSIS — F325 Major depressive disorder, single episode, in full remission: Secondary | ICD-10-CM

## 2019-11-01 DIAGNOSIS — F17200 Nicotine dependence, unspecified, uncomplicated: Secondary | ICD-10-CM

## 2019-11-01 DIAGNOSIS — J449 Chronic obstructive pulmonary disease, unspecified: Secondary | ICD-10-CM

## 2019-11-01 DIAGNOSIS — Z79899 Other long term (current) drug therapy: Secondary | ICD-10-CM

## 2019-11-02 ENCOUNTER — Other Ambulatory Visit: Payer: Self-pay | Admitting: Internal Medicine

## 2019-11-02 DIAGNOSIS — N3001 Acute cystitis with hematuria: Secondary | ICD-10-CM

## 2019-11-02 DIAGNOSIS — R7989 Other specified abnormal findings of blood chemistry: Secondary | ICD-10-CM

## 2019-11-02 DIAGNOSIS — R945 Abnormal results of liver function studies: Secondary | ICD-10-CM

## 2019-11-02 LAB — COMPLETE METABOLIC PANEL WITH GFR
AG Ratio: 1.3 (calc) (ref 1.0–2.5)
ALT: 43 U/L — ABNORMAL HIGH (ref 6–29)
AST: 46 U/L — ABNORMAL HIGH (ref 10–35)
Albumin: 3.7 g/dL (ref 3.6–5.1)
Alkaline phosphatase (APISO): 343 U/L — ABNORMAL HIGH (ref 37–153)
BUN: 17 mg/dL (ref 7–25)
CO2: 29 mmol/L (ref 20–32)
Calcium: 9.2 mg/dL (ref 8.6–10.4)
Chloride: 101 mmol/L (ref 98–110)
Creat: 0.72 mg/dL (ref 0.50–0.99)
GFR, Est African American: 101 mL/min/{1.73_m2} (ref >=60)
GFR, Est Non African American: 87 mL/min/{1.73_m2} (ref >=60)
Globulin: 2.8 g/dL (calc) (ref 1.9–3.7)
Glucose, Bld: 116 mg/dL — ABNORMAL HIGH (ref 65–99)
Potassium: 4.3 mmol/L (ref 3.5–5.3)
Sodium: 138 mmol/L (ref 135–146)
Total Bilirubin: 0.3 mg/dL (ref 0.2–1.2)
Total Protein: 6.5 g/dL (ref 6.1–8.1)

## 2019-11-02 LAB — URINALYSIS, ROUTINE W REFLEX MICROSCOPIC
Bilirubin Urine: NEGATIVE
Glucose, UA: NEGATIVE
Hyaline Cast: NONE SEEN /LPF
Ketones, ur: NEGATIVE
Nitrite: NEGATIVE
Protein, ur: NEGATIVE
Specific Gravity, Urine: 1.018 (ref 1.001–1.03)
pH: <=5 (ref 5.0–8.0)

## 2019-11-02 LAB — CBC WITH DIFFERENTIAL/PLATELET
Absolute Monocytes: 502 cells/uL (ref 200–950)
Basophils Absolute: 38 cells/uL (ref 0–200)
Basophils Relative: 0.7 %
Eosinophils Absolute: 362 cells/uL (ref 15–500)
Eosinophils Relative: 6.7 %
HCT: 39.5 % (ref 35.0–45.0)
Hemoglobin: 12.8 g/dL (ref 11.7–15.5)
Lymphs Abs: 1717 cells/uL (ref 850–3900)
MCH: 29.2 pg (ref 27.0–33.0)
MCHC: 32.4 g/dL (ref 32.0–36.0)
MCV: 90.2 fL (ref 80.0–100.0)
MPV: 11.2 fL (ref 7.5–12.5)
Monocytes Relative: 9.3 %
Neutro Abs: 2781 cells/uL (ref 1500–7800)
Neutrophils Relative %: 51.5 %
Platelets: 224 10*3/uL (ref 140–400)
RBC: 4.38 10*6/uL (ref 3.80–5.10)
RDW: 12.9 % (ref 11.0–15.0)
Total Lymphocyte: 31.8 %
WBC: 5.4 10*3/uL (ref 3.8–10.8)

## 2019-11-02 LAB — LIPID PANEL
Cholesterol: 142 mg/dL (ref <200)
HDL: 45 mg/dL — ABNORMAL LOW (ref >=50)
LDL Cholesterol (Calc): 84 mg/dL (calc)
Non-HDL Cholesterol (Calc): 97 mg/dL (calc) (ref <130)
Total CHOL/HDL Ratio: 3.2 (calc) (ref <5.0)
Triglycerides: 54 mg/dL (ref <150)

## 2019-11-02 LAB — MAGNESIUM: Magnesium: 2.1 mg/dL (ref 1.5–2.5)

## 2019-11-02 LAB — TSH: TSH: 2.72 mIU/L (ref 0.40–4.50)

## 2019-11-02 LAB — VITAMIN D 25 HYDROXY (VIT D DEFICIENCY, FRACTURES): Vit D, 25-Hydroxy: 84 ng/mL (ref 30–100)

## 2019-11-02 LAB — HEMOGLOBIN A1C
Hgb A1c MFr Bld: 5.6 % of total Hgb (ref <5.7)
Mean Plasma Glucose: 114 (calc)
eAG (mmol/L): 6.3 (calc)

## 2019-11-02 LAB — INSULIN, RANDOM: Insulin: 7.9 u[IU]/mL

## 2019-11-02 MED ORDER — NITROFURANTOIN MONOHYD MACRO 100 MG PO CAPS: ORAL_CAPSULE | ORAL | 0 refills | Status: DC

## 2019-11-02 NOTE — Progress Notes (Signed)
======================================================== -   Test results slightly outside the reference range are not unusual. If there is anything important, I will review this with you,  otherwise it is considered normal test values.  If you have further questions,  please do not hesitate to contact me at the office or via My Chart.  =========================================================  -  U/A shows a UTI - So to save a trip back to the office for another  specimen for culture, will send in Rx for Macrobid  to your Drug store and  then please call office  to schedule a Nurse visit in 3 - 4 weeks to recheck   urine & culture to assure infection is cleared up .  =========================================================  -  Alkaline Phosphatase  & 2 liver enzymes are also elevated - will request / schedule for Ultrasound to look at Liver  ==========================================================  -  Total Chol = 142 & LDL 84 - Both  Excellent   - Very low risk for Heart Attack  / Stroke =========================================================  - A1c = 5.6% - Back to Normal Non Diabetic range - Great  ===========================================================  -  Vitamin D = 84 - Excellent  ===========================================================  -  All Else - CBC - Kidneys - Electrolytes - Liver - Magnesium & Thyroid    - all  Normal / OK ====================================================

## 2019-11-10 ENCOUNTER — Ambulatory Visit
Admission: RE | Admit: 2019-11-10 | Discharge: 2019-11-10 | Disposition: A | Discharge status: Home / Self Care | Payer: Medicare Other | Source: Ambulatory Visit | Attending: Internal Medicine | Admitting: Internal Medicine

## 2019-11-10 DIAGNOSIS — R7989 Other specified abnormal findings of blood chemistry: Secondary | ICD-10-CM

## 2019-11-10 DIAGNOSIS — R945 Abnormal results of liver function studies: Secondary | ICD-10-CM

## 2019-11-10 NOTE — Progress Notes (Signed)
-   Good News   - Liver Ultra sound found No Cancer  ==========================================================  -  Bad News   - Shows a fatty liver which tragically over time will lead to problems   - So the treatment is   - weight loss   - Below is a   Fatty liver (Hepatic Steatosis) or Nonalcoholic fatty liver disease (NASH) is now the leading cause of liver failure in the [place]. It is normally from such risk factors as   obesity,   diabetes, insulin resistance, high cholesterol, or metabolic syndrome. The only definitive therapy is   weight loss and exercise.   Suggest walking 20-30 mins daily.  Decreasing carbohydrates, increasing veggies.  Liver cancer has been noted in patient with fatty liver without cirrhosis.  Will monitor closely   Fatty Liver   Fatty liver is the accumulation of fat in liver cells. It is also called hepatosteatosis or steatohepatitis. It is normal for your liver to contain some fat. If fat is more than 5 to 10% of your liver's weight, you have fatty liver.   There are often no symptoms (problems) for years while damage is still occurring. People often learn about their fatty liver when they have medical tests for other reasons. Fat can damage your liver for years or even decades without causing problems. When it becomes severe, it can cause fatigue, weight loss, weakness, and confusion.   This makes you more likely to develop more serious liver problems. The liver is the largest organ in the body. It does a lot of work and often gives no warning signs when it is sick until late in a disease.   The liver has many important jobs including:  Breaking down foods.  Storing vitamins, iron, and other minerals.  Making proteins.  Making bile for food digestion.  Breaking down many products including medications, alcohol and some poisons.   PROGNOSIS   Fatty liver may cause no damage or it can lead to an inflammation of the liver. This is,  called steatohepatitis.   Over time the liver may become scarred and hardened. This condition is called cirrhosis.   Cirrhosis is serious and may lead to liver failure or cancer.   NASH is one of the leading causes of cirrhosis.   About 10-20% of Americans have fatty liver and a smaller 2-5% has NASH.   TREATMENT   Weight loss, fat restriction, and exercise In overweight patients produces inconsistent results but is worth trying.   Good control of diabetes may reduce fatty liver.   Eat a balanced, healthy diet.   Increase your physical activity.   There are no medical or surgical treatments for a fatty liver or NASH, but improving your diet and increasing your exercise may help prevent or reverse some of the damage.

## 2019-11-27 ENCOUNTER — Encounter: Payer: Medicare Other | Admitting: Adult Health

## 2019-12-02 ENCOUNTER — Other Ambulatory Visit: Payer: Self-pay | Admitting: Internal Medicine

## 2019-12-02 DIAGNOSIS — E1122 Type 2 diabetes mellitus with diabetic chronic kidney disease: Secondary | ICD-10-CM

## 2019-12-02 DIAGNOSIS — E782 Mixed hyperlipidemia: Secondary | ICD-10-CM

## 2019-12-14 ENCOUNTER — Encounter: Payer: Self-pay | Admitting: Internal Medicine

## 2020-01-01 IMAGING — NM NM HEPATO W/GB/PHARM/[PERSON_NAME]
2 series · 12 of 12 positions shown · non-contrast
Comparison: CT 10/28/2018, ultrasound 10/18/2018

CLINICAL DATA: Right upper quadrant pain

EXAM:
NUCLEAR MEDICINE HEPATOBILIARY IMAGING WITH GALLBLADDER EF
TECHNIQUE: Sequential images of the abdomen were obtained [DATE] minutes
following intravenous administration of radiopharmaceutical. After
oral ingestion of Ensure, gallbladder ejection fraction was
determined. At 60 min, normal ejection fraction is greater than 33%.
RADIOPHARMACEUTICALS:  5.1 mCi 7c-XXm  Choletec IV

[he hepatobiliary · 4.52mm/px · 6 of 60 frames shown (1 of 2)]
[frame 6/60]
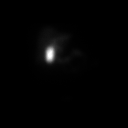
[frame 16/60]
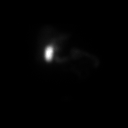
[frame 26/60]
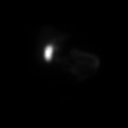
[frame 36/60]
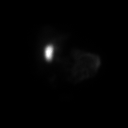
[frame 46/60]
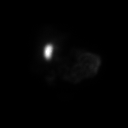
[frame 56/60]
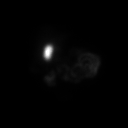

[he hepatobiliary · 4.52mm/px · 6 of 60 frames shown (2 of 2)]
[frame 6/60]
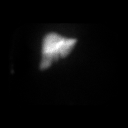
[frame 16/60]
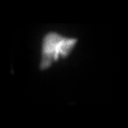
[frame 26/60]
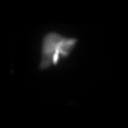
[frame 36/60]
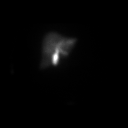
[frame 46/60]
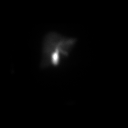
[frame 56/60]
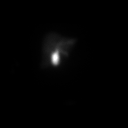

[12 of 12 positions shown; findings below may reference images not displayed]

FINDINGS: Prompt uptake and biliary excretion of activity by the liver is
seen. Gallbladder activity is visualized, consistent with patency of
cystic duct. Nonspecific slightly delayed biliary to bowel transit
time

Calculated gallbladder ejection fraction is 8%. (Normal gallbladder
ejection fraction with Ensure is greater than 33%.)
IMPRESSION: Abnormal gallbladder ejection fraction of 8% which can be seen in
functional gallbladder disease in the appropriate clinical setting.

## 2020-02-06 ENCOUNTER — Ambulatory Visit: Payer: Medicare Other | Admitting: Adult Health

## 2020-02-06 ENCOUNTER — Encounter: Payer: Self-pay | Admitting: Adult Health

## 2020-02-06 DIAGNOSIS — E1122 Type 2 diabetes mellitus with diabetic chronic kidney disease: Secondary | ICD-10-CM | POA: Insufficient documentation

## 2020-02-06 DIAGNOSIS — N182 Chronic kidney disease, stage 2 (mild): Secondary | ICD-10-CM | POA: Insufficient documentation

## 2020-02-06 LAB — HM MAMMOGRAPHY

## 2020-02-06 NOTE — Progress Notes (Signed)
FOLLOW UP  Assessment and Plan:   Atherosclerosis of aorta Control blood pressure, cholesterol, glucose, increase exercise.   Hypertension Well controlled off of medications Monitor blood pressure at home; patient to call if consistently greater than 130/80 Continue DASH diet.   Reminder to go to the ER if any CP, SOB, nausea, dizziness, severe HA, changes vision/speech, left arm numbness and tingling and jaw pain.  COPD Per imaging; asymptomatic, monitor;   OSA/obesity hypoventilation syndrome  Weight loss to achieve normal weight encouraged; has lost 60+ lb in past year; not snoring; declines CPAP  Cholesterol Now taking rosuvastatin 10 mg daily LDL goal <70 Continue low cholesterol diet and exercise.  Check lipid panel.   Diabetes with diabetic chronic kidney disease Currently controlled in prediabetic range by diet/lifestyle only - discussed need for weight loss Continue diet and exercise.  Perform daily foot/skin check, notify office of any concerning changes.  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  Discussed risks of screening including but not limited to false positives on xray, further testing or consultation with specialist, and possible false negative CT as well. Understanding expressed and wishes to proceed with CT testing. Order placed.   LLE diminished pulses Cool, 1+ pulses compared to R, smoker, suspect PAD, denies claudication Discussed imaging, wants to defer for now, plan to pursue next year Control blood pressure, cholesterol, glucose, increase exercise.   Continue diet and meds as discussed. Further disposition pending results of labs. Discussed med's effects and SE's.   Over 30 minutes of exam, counseling, chart review, and critical decision making was performed.   Future Appointments  Date Time Provider [place]  05/14/2020  2:30 PM [name], MD [facility] None  08/12/2020  2:00 PM [place] PROVIDER [place] None  11/07/2020  2:00 PM [name], MD [facility] None    ----------------------------------------------------------------------------------------------------------------------  HPI 69 y.o. female  presents for 3 month follow up on hypertension, cholesterol, diabetes, morbid obesity, GERD, depression and vitamin D deficiency. Hx of lumbar surgery by Dr. Ronnald Ramp and has done well since.    She had unsuccessful revision of L knee arthroplasty with Dr. Tracie Harrier ([facility] in [place]), still has some instability and does maintenance therapy/ exercise and using a walker for stability.   she has a diagnosis of depression/anxiety, managed by NP Pauline Good, currently in remission on medications (venlafaxine daily, 0.25 mg xanax BID PRN, reports symptoms are well controlled on current regimen). She is also prescribed zaleplon 10 mg RPN for  sleep which she takes  rarely. Using CBD oil at night for sleep.   she currently continues to smoke 0.5 pack a day; smoked 1 pack day from age [age], less before and has cut down recently; discussed risks associated with smoking, patient is not ready to quit but is working on tapering down. She has COPD diagnosed by imaging currently asymptomatic without daily inhaler. Estimated pack year history of 35 years. Had low dose chest CT 03/30/2019 which was benign.   She has hx of sleep apnea, reports was mild, was recommended CPAP but couldn't tolerate. Has sine lost weight. She denies AM headaches; no snoring.   Has OAB with episodes of incontinence, has seen Dr. Matilde Sprang, medications didn't work well, will be having botox injection.   she has GERD which is currently managed by famotidine 10 mg at night, and reglan 5 mg BID Has IBS- more constipation, Dr. Collene Mares has seen, taking linzess 145 mg every other night with good results. Also uses colace as needed.   BMI is Body mass index is 29.79 kg/m., she has been working on diet and exercise. Doing PT three days a week. She is down from 270 lb in Sept 2019. She has also been seeing a nutritionist and helping with GI sx and weight loss.  Wt Readings from Last 3 Encounters:  02/07/20 179 lb (81.2 kg)  11/01/19 177 lb 3.2 oz (80.4 kg)  07/19/19 184 lb 1.6 oz (83.5 kg)   Her blood pressure has not been controlled at home, today their BP is BP: 102/66 She tapered off of olmesartan due to low BPs, continues to do well.   She does workout. She denies chest pain, shortness of breath, dizziness.  1 vessel CAD and aortic atherosclerosis per CT 03/2019.    She is on cholesterol medication (rosuvastatin 10 mg daily) and denies myalgias. Her cholesterol is not at goal of LDL <70.  The cholesterol last visit was:   Lab Results  Component Value Date   CHOL 142 11/01/2019   HDL 45 (L) 11/01/2019   LDLCALC 84 11/01/2019   TRIG 54 11/01/2019   CHOLHDL 3.2 11/01/2019    She has been  working on diet and exercise for T2DM currently controlled by diet alone (A1C 6.7% in 09/2018, has since improved to WNL), and denies foot ulcerations, increased appetite, nausea, paresthesia of the feet, polydipsia, polyuria, visual disturbances and vomiting.  No longer checking sugars due to consistently low values. 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 albuterol (VENTOLIN HFA) 108 (90 Base) MCG/ACT inhaler Use 2 inhalations 15 minutes apart every 4 hours as needed to rescue Asthma Attack  . ALPRAZolam (XANAX) 0.25 MG tablet Take 1/2 to 1 tablet 2 x / day if needed for anxiety. (Patient taking differently: Take 0.25 mg by mouth 2 (two) times daily. )  . docusate sodium (COLACE) 100 MG capsule Take 100 mg by mouth 2 (two) times daily.  . famotidine (PEPCID) 10 MG tablet Take 10 mg  by mouth at bedtime.  Marland Kitchen glucose blood (ACCU-CHEK AVIVA PLUS) test strip   . LINZESS 145 MCG CAPS capsule Take 145 mcg by mouth every morning.  . metoCLOPramide (REGLAN) 10 MG tablet Take 1 tablet 4 x /day before Meals & Bedtime for Acid Reflux  . ONETOUCH DELICA LANCETS 46T MISC Check blood sugar 1 time daily-[id number]  . ONETOUCH VERIO test strip USE TO CHECK BLOOD SUGAR ONCE DAILY  . rosuvastatin (CRESTOR) 10 MG tablet Take    1 tablet     Daily    for Cholesterol  . venlafaxine XR (EFFEXOR-XR) 150 MG 24 hr capsule Take 150 mg by mouth 2 (two) times daily.   . zaleplon (SONATA) 10 MG capsule Take 10 mg by mouth at bedtime as needed for sleep.   Marland Kitchen olmesartan (BENICAR) 40 MG tablet Take 1 tablet (40 mg total) by  mouth daily.  . pantoprazole (PROTONIX) 40 MG tablet Take 1 tablet every Morning for Acid Indigestion (Patient not taking: Reported on 02/07/2020)   No current facility-administered medications on file prior to visit.     Allergies:  Allergies  Allergen Reactions  . Penicillins Anaphylaxis and Rash       . Sulfa Antibiotics Shortness Of Breath    Also light headed, rash  . Celebrex [Celecoxib] Other (See Comments)    Elevates Blood Pressure   . Ciprofloxacin Nausea And Vomiting  . Codeine Nausea And Vomiting    And hydrocodone  . Levaquin [Levofloxacin] Nausea And Vomiting  . Motrin Ib [Ibuprofen] Hives  . Prempro [Conj Estrog-Medroxyprogest Ace] Hives     Medical History:  Past Medical History:  Diagnosis Date  . Abscessed tooth   . Anemia   . Anxiety   . Arthritis   . Bell's palsy 01/1985  . Complication of anesthesia   . COPD (chronic obstructive pulmonary disease) ([place])   . Depression   . Diabetes mellitus without complication ([place])   . GERD (gastroesophageal reflux disease)   . Hypertension   . IBS (irritable bowel syndrome) 12/89  . Obesity hypoventilation syndrome ([place]) 11/19/2014  . Obesity, Class III, BMI 40-49.9 (morbid obesity) ([place])   . OSA (obstructive sleep apnea)    no cpap  . Vitamin D deficiency    Family history- Reviewed and unchanged Social history- Reviewed and unchanged   Review of Systems:  Review of Systems  Constitutional: Negative for malaise/fatigue and weight loss.  HENT: Negative for hearing loss and tinnitus.   Eyes: Negative for blurred vision and double vision.  Respiratory: Negative for cough, shortness of breath and wheezing.   Cardiovascular: Negative for chest pain, palpitations, orthopnea, claudication and leg swelling.  Gastrointestinal: Negative for abdominal pain, blood in stool, constipation, diarrhea, heartburn, melena, nausea and vomiting.  Genitourinary: Negative.   Musculoskeletal: Positive for joint pain (knee).  Negative for myalgias.  Skin: Negative for rash.  Neurological: Negative for dizziness, tingling, sensory change, weakness and headaches.  Endo/Heme/Allergies: Negative for polydipsia.  Psychiatric/Behavioral: Negative.   All other systems reviewed and are negative.     Physical Exam: BP 102/66   Pulse 85   Temp (!) 97.5 F (36.4 C)   Wt 179 lb (81.2 kg)   LMP 04/09/2009   SpO2 97%   BMI 29.79 kg/m  Wt Readings from Last 3 Encounters:  02/07/20 179 lb (81.2 kg)  11/01/19 177 lb 3.2 oz (80.4 kg)  07/19/19 184 lb 1.6 oz (83.5 kg)   General Appearance: Well nourished, in no apparent distress. Eyes:  PERRLA, EOMs, conjunctiva no swelling or erythema Sinuses: No Frontal/maxillary tenderness ENT/Mouth: Ext aud canals clear, TMs without erythema, bulging. No erythema, swelling, or exudate on post pharynx.  Tonsils not swollen or erythematous. Hearing normal.  Neck: Supple, thyroid normal.  Respiratory: Respiratory effort normal, BS equal bilaterally without rales, rhonchi, wheezing or stridor.  Cardio: RRR with no MRGs. LLE pulses mildy dimished 1+ pedal, mildly cool, RLE 2+ intact Abdomen: Soft, + BS.  Non tender, no guarding, rebound, hernias, masses. Lymphatics: Non tender without lymphadenopathy.  Musculoskeletal: Full ROM, 5/5 strength, slow gait with walker Skin: Warm, dry without rashes, lesions, ecchymosis.  Neuro: Cranial nerves intact. No cerebellar symptoms.  Psych: Awake and oriented X 3, normal affect, Insight and Judgment appropriate.    Beth Ribas, NP 2:46 PM [facility]

## 2020-02-07 ENCOUNTER — Other Ambulatory Visit: Payer: Self-pay

## 2020-02-07 ENCOUNTER — Encounter: Payer: Self-pay | Admitting: Adult Health

## 2020-02-07 ENCOUNTER — Ambulatory Visit (INDEPENDENT_AMBULATORY_CARE_PROVIDER_SITE_OTHER): Payer: Medicare Other | Admitting: Adult Health

## 2020-02-07 VITALS — BP 102/66 | HR 85 | Temp 97.5°F | Wt 179.0 lb

## 2020-02-07 DIAGNOSIS — D509 Iron deficiency anemia, unspecified: Secondary | ICD-10-CM

## 2020-02-07 DIAGNOSIS — E663 Overweight: Secondary | ICD-10-CM

## 2020-02-07 DIAGNOSIS — N3281 Overactive bladder: Secondary | ICD-10-CM

## 2020-02-07 DIAGNOSIS — J449 Chronic obstructive pulmonary disease, unspecified: Secondary | ICD-10-CM

## 2020-02-07 DIAGNOSIS — I10 Essential (primary) hypertension: Secondary | ICD-10-CM

## 2020-02-07 DIAGNOSIS — F17200 Nicotine dependence, unspecified, uncomplicated: Secondary | ICD-10-CM

## 2020-02-07 DIAGNOSIS — E662 Morbid (severe) obesity with alveolar hypoventilation: Secondary | ICD-10-CM

## 2020-02-07 DIAGNOSIS — G4733 Obstructive sleep apnea (adult) (pediatric): Secondary | ICD-10-CM

## 2020-02-07 DIAGNOSIS — Z122 Encounter for screening for malignant neoplasm of respiratory organs: Secondary | ICD-10-CM

## 2020-02-07 DIAGNOSIS — I700 Atherosclerosis of aorta: Secondary | ICD-10-CM

## 2020-02-07 DIAGNOSIS — K760 Fatty (change of) liver, not elsewhere classified: Secondary | ICD-10-CM

## 2020-02-07 DIAGNOSIS — N182 Chronic kidney disease, stage 2 (mild): Secondary | ICD-10-CM

## 2020-02-07 DIAGNOSIS — F325 Major depressive disorder, single episode, in full remission: Secondary | ICD-10-CM

## 2020-02-07 DIAGNOSIS — E785 Hyperlipidemia, unspecified: Secondary | ICD-10-CM

## 2020-02-07 DIAGNOSIS — Z79899 Other long term (current) drug therapy: Secondary | ICD-10-CM

## 2020-02-07 DIAGNOSIS — E1122 Type 2 diabetes mellitus with diabetic chronic kidney disease: Secondary | ICD-10-CM

## 2020-02-07 DIAGNOSIS — E559 Vitamin D deficiency, unspecified: Secondary | ICD-10-CM

## 2020-02-07 DIAGNOSIS — E1169 Type 2 diabetes mellitus with other specified complication: Secondary | ICD-10-CM

## 2020-02-07 NOTE — Patient Instructions (Signed)
Please monitor left leg - suspect some vascular disease  Ideally try to cut back/quit smoking  Will plan to pursue vascular study next year     Peripheral Vascular Disease Peripheral vascular disease (PVD) is a disease of the blood vessels. A simple term for PVD is poor circulation. In most cases, PVD narrows the blood vessels that carry blood from your heart to the rest of your body. This can result in a decreased supply of blood to your arms, legs, and internal organs, like your stomach or kidneys. However, it most often affects a persons lower legs and feet. There are two types of PVD.  Organic PVD. This is the more common type. It is caused by damage to the structure of blood vessels.  Functional PVD. This is caused by conditions that make blood vessels contract and tighten (spasm). Without treatment, PVD tends to get worse over time. PVD can also lead to acute limb ischemia. This is when an arm or leg suddenly has trouble getting enough blood. This is a medical emergency. What are the causes?  Each type of PVD has many different causes. The most common cause of PVD is buildup of a fatty material (plaque) inside your arteries (atherosclerosis). Small amounts of plaque can break off from the walls of the blood vessels and become lodged in a smaller artery. This blocks blood flow and can cause acute limb ischemia. Other common causes of PVD include:  Blood clots that form inside of blood vessels.  Injuries to blood vessels.  Diseases that cause inflammation of blood vessels or cause blood vessel spasms.  Health behaviors and health history that increase your risk of developing PVD. What increases the risk? You are more likely to develop this condition if:  You have a family history of PVD.  You have certain medical conditions, including: ? High cholesterol. ? Diabetes. ? High blood pressure (hypertension). ? Coronary heart disease. ? Past problems with blood  clots. ? Past injury, such as burns or a broken bone. These may have damaged blood vessels in your limbs. ? Buerger disease. This is caused by inflamed blood vessels in your hands and feet. ? Some forms of arthritis. ? Rare birth defects that affect the arteries in your legs. ? Kidney disease.  You use tobacco or smoke.  You do not get enough exercise.  You are obese.  You are age 25 or older. What are the signs or symptoms? This condition may cause different symptoms. Your symptoms depend on what part of your body is not getting enough blood. Some common signs and symptoms include:  Cramps in your lower legs. This may be a symptom of poor leg circulation (claudication).  Pain and weakness in your legs. This happens while you are physically active but goes away when you rest (intermittent claudication).  Leg pain when at rest.  Leg numbness, tingling, or weakness.  Coldness in a leg or foot, especially when compared with the other leg.  Skin or hair changes. These can include: ? Hair loss. ? Shiny skin. ? Pale or bluish skin. ? Thick toenails.  Inability to get or maintain an erection (erectile dysfunction).  Fatigue. People with PVD are more likely to develop ulcers and sores on their toes, feet, or legs. These may take longer than normal to heal. How is this diagnosed? This condition is diagnosed based on:  Your signs and symptoms.  A physical exam and your medical history.  Other tests to find out what  is causing your PVD and to determine its severity. Tests may include: ? Blood pressure recordings from your arms and legs and measurements of the strength of your pulses (pulse volume recordings). ? Imaging studies using sound waves to take pictures of the blood flow through your blood vessels (Doppler ultrasound). ? Injecting a dye into your blood vessels before having imaging studies using:  X-rays (angiogram or arteriogram).  Computer-generated X-rays (CT  angiogram).  A powerful electromagnetic field and a computer (magnetic resonance angiogram or MRA). How is this treated? Treatment for PVD depends on the cause of your condition and how severe your symptoms are. It also depends on your age. Underlying causes need to be treated and controlled. These include long-term (chronic) conditions, such as diabetes, high cholesterol, and high blood pressure. Treatment includes:  Lifestyle changes, such as: ? Quitting smoking. ? Exercising regularly. ? Following a low-fat, low-cholesterol diet.  Taking medicines, such as: ? Blood thinners to prevent blood clots. ? Medicines to improve blood flow. ? Medicines to improve your blood cholesterol levels.  Surgical procedures, such as: ? A procedure that uses an inflated balloon to open a blocked artery and improve blood flow (angioplasty). ? A procedure to put in a wire mesh tube to keep a blocked artery open (stent implant). ? Surgery to reroute blood flow around a blocked artery (peripheral bypass surgery). ? Surgery to remove dead tissue from an infected wound on the affected limb. ? Amputation. This is surgical removal of the affected limb. It may be necessary in cases of acute limb ischemia where there has been no improvement through medical or surgical treatments. Follow these instructions at home: Lifestyle  Do not use any products that contain nicotine or tobacco, such as cigarettes and e-cigarettes. If you need help quitting, ask your health care provider.  Lose weight if you are overweight, and maintain a healthy weight as discussed by your health care provider.  Eat a diet that is low in fat and cholesterol. If you need help, ask your health care provider.  Exercise regularly. Ask your health care provider to suggest some good activities for you. General instructions  Take over-the-counter and prescription medicines only as told by your health care provider.  Take good care of your  feet: ? Wear comfortable shoes that fit well. ? Check your feet often for any cuts or sores.  Keep all follow-up visits as told by your health care provider. This is important. Contact a health care provider if:  You have cramps in your legs while walking.  You have leg pain when you are at rest.  You have coldness in a leg or foot.  Your skin changes.  You have erectile dysfunction.  You have cuts or sores on your feet that are not healing. Get help right away if:  Your arm or leg turns cold, numb, and blue.  Your arms or legs become red, warm, swollen, painful, or numb.  You have chest pain or trouble breathing.  You suddenly have weakness in your face, arm, or leg.  You become very confused or lose the ability to speak.  You suddenly have a very bad headache or lose your vision. Summary  Peripheral vascular disease (PVD) is a disease of the blood vessels.  In most cases, PVD narrows the blood vessels that carry blood from your heart to the rest of your body.  PVD may cause different symptoms. Your symptoms depend on what part of your body is not getting enough  blood.  Treatment for PVD depends on the cause of your condition and how severe your symptoms are. This information is not intended to replace advice given to you by your health care provider. Make sure you discuss any questions you have with your health care provider. Document Revised: 02/05/2017 Document Reviewed: 04/02/2016 Elsevier Patient Education  2020 [employer].

## 2020-02-08 LAB — COMPLETE METABOLIC PANEL WITH GFR
AG Ratio: 1.2 (calc) (ref 1.0–2.5)
ALT: 53 U/L — ABNORMAL HIGH (ref 6–29)
AST: 52 U/L — ABNORMAL HIGH (ref 10–35)
Albumin: 3.8 g/dL (ref 3.6–5.1)
Alkaline phosphatase (APISO): 310 U/L — ABNORMAL HIGH (ref 37–153)
BUN/Creatinine Ratio: 36 (calc) — ABNORMAL HIGH (ref 6–22)
BUN: 29 mg/dL — ABNORMAL HIGH (ref 7–25)
CO2: 30 mmol/L (ref 20–32)
Calcium: 9.3 mg/dL (ref 8.6–10.4)
Chloride: 104 mmol/L (ref 98–110)
Creat: 0.8 mg/dL (ref 0.50–0.99)
GFR, Est African American: 88 mL/min/{1.73_m2} (ref >=60)
GFR, Est Non African American: 76 mL/min/{1.73_m2} (ref >=60)
Globulin: 3.1 g/dL (calc) (ref 1.9–3.7)
Glucose, Bld: 100 mg/dL — ABNORMAL HIGH (ref 65–99)
Potassium: 4.1 mmol/L (ref 3.5–5.3)
Sodium: 141 mmol/L (ref 135–146)
Total Bilirubin: 0.3 mg/dL (ref 0.2–1.2)
Total Protein: 6.9 g/dL (ref 6.1–8.1)

## 2020-02-08 LAB — CBC WITH DIFFERENTIAL/PLATELET
Absolute Monocytes: 593 cells/uL (ref 200–950)
Basophils Absolute: 48 cells/uL (ref 0–200)
Basophils Relative: 0.7 %
Eosinophils Absolute: 442 cells/uL (ref 15–500)
Eosinophils Relative: 6.4 %
HCT: 40.1 % (ref 35.0–45.0)
Hemoglobin: 13.3 g/dL (ref 11.7–15.5)
Lymphs Abs: 1711 cells/uL (ref 850–3900)
MCH: 29.8 pg (ref 27.0–33.0)
MCHC: 33.2 g/dL (ref 32.0–36.0)
MCV: 89.7 fL (ref 80.0–100.0)
MPV: 11.8 fL (ref 7.5–12.5)
Monocytes Relative: 8.6 %
Neutro Abs: 4106 cells/uL (ref 1500–7800)
Neutrophils Relative %: 59.5 %
Platelets: 209 10*3/uL (ref 140–400)
RBC: 4.47 10*6/uL (ref 3.80–5.10)
RDW: 13.5 % (ref 11.0–15.0)
Total Lymphocyte: 24.8 %
WBC: 6.9 10*3/uL (ref 3.8–10.8)

## 2020-02-08 LAB — HEMOGLOBIN A1C
Hgb A1c MFr Bld: 5.4 % of total Hgb (ref <5.7)
Mean Plasma Glucose: 108 (calc)
eAG (mmol/L): 6 (calc)

## 2020-02-08 LAB — LIPID PANEL
Cholesterol: 139 mg/dL (ref <200)
HDL: 46 mg/dL — ABNORMAL LOW (ref >=50)
LDL Cholesterol (Calc): 79 mg/dL (calc)
Non-HDL Cholesterol (Calc): 93 mg/dL (calc) (ref <130)
Total CHOL/HDL Ratio: 3 (calc) (ref <5.0)
Triglycerides: 65 mg/dL (ref <150)

## 2020-02-08 LAB — TSH: TSH: 2.22 mIU/L (ref 0.40–4.50)

## 2020-02-08 LAB — MAGNESIUM: Magnesium: 2.1 mg/dL (ref 1.5–2.5)

## 2020-02-09 ENCOUNTER — Other Ambulatory Visit: Payer: Self-pay | Admitting: Internal Medicine

## 2020-02-09 DIAGNOSIS — E1122 Type 2 diabetes mellitus with diabetic chronic kidney disease: Secondary | ICD-10-CM

## 2020-02-09 DIAGNOSIS — E782 Mixed hyperlipidemia: Secondary | ICD-10-CM

## 2020-02-15 ENCOUNTER — Encounter: Payer: Self-pay | Admitting: Internal Medicine

## 2020-03-29 ENCOUNTER — Ambulatory Visit: Payer: Medicare Other

## 2020-04-26 ENCOUNTER — Ambulatory Visit
Admission: RE | Admit: 2020-04-26 | Discharge: 2020-04-26 | Disposition: A | Discharge status: Home / Self Care | Payer: Medicare Other | Source: Ambulatory Visit | Attending: Adult Health | Admitting: Adult Health

## 2020-04-26 ENCOUNTER — Other Ambulatory Visit: Payer: Self-pay

## 2020-04-26 DIAGNOSIS — Z122 Encounter for screening for malignant neoplasm of respiratory organs: Secondary | ICD-10-CM

## 2020-04-26 DIAGNOSIS — F17200 Nicotine dependence, unspecified, uncomplicated: Secondary | ICD-10-CM

## 2020-05-13 ENCOUNTER — Encounter: Payer: Self-pay | Admitting: Internal Medicine

## 2020-05-13 NOTE — Progress Notes (Signed)
History of Present Illness:       This very nice 70 y.o. DWF presents for 6  month follow up with HTN, HLD, diet controlled T2_DM and Vitamin D Deficiency. Patient's GERD is controlled on her meds. Abd CT Scan in Jan 2021 revealed Aortic Atherosclerosis. Patient is on CPAP for OSA with improved  Restorative Sleep. Patient has OSA & COPD Overlap Syndrome.   Patient is also followed by Dr Mina Marble & Dr Hardin Negus for Chronic Pain Syndrome from Lumbar DDD.         Patient is treated for HTN circa 2007 & BP has been controlled at home.  After losing 90 # during the 1.5 years before last appt 6 months ago, she was advised to stop her Benicar & BP has remained Normal. Today's BP is at goal - 114/84. Patient has had no complaints of any cardiac type chest pain, palpitations, dyspnea Vertell Limber /PND, dizziness, claudication, or dependent edema.       Hyperlipidemia is controlled with diet & Rosuvastatin. Patient denies myalgias or other med SE's. Last Lipids were at goal:  Lab Results  Component Value Date   CHOL 139 02/07/2020   HDL 46 (L) 02/07/2020   LDLCALC 79 02/07/2020   TRIG 65 02/07/2020   CHOLHDL 3.0 02/07/2020    Also, the patient has Morbid Obesity (BMI 30+) and  T2_DM since 2008 attempting control by diet and she also has CKD2 (GFR 76) . She denies symptoms of reactive hypoglycemia, diabetic polys, paresthesias or visual blurring.  Last A1c was normal  & at goal:  Lab Results  Component Value Date   HGBA1C 5.4 02/07/2020       Further, the patient also has history of Vitamin D Deficiency ("13" /2008) and supplements vitamin D without any suspected side-effects. Last vitamin D was at goal:  Lab Results  Component Value Date   VD25OH 84 11/01/2019    Current Outpatient Medications on File Prior to Visit  Medication Sig  . Albuterol HFA inhaler Use 2 inhalations every 4 hrs as needed   . ALPRAZolam 0.25 MG tablet Take 0.25 mg  2  times daily  . b complex vitamins capsule  Take 1 capsule daily.  . CVITAMIN D 50 MCG 2000 u Take 1 capsule  daily.  Marland Kitchen COLACE)100 MG capsule Take 2  times daily.  . famotidine  10 MG tablet Take  at bedtime.  Marland Kitchen LINZESS 145 MCG CAPS cap Take  every morning.  . Magnesium 500 MG TABS Take 1 tablet  daily.  . metoCLOPramide  10 MG tablet Take 1 tablet 4 x /day before Meals/Bedtime   . milk thistle 175 MG tablet Take daily.  Marland Kitchen FISH OIL OMEGA-3  Take 1 capsule daily.  . rosuvastatin  10 MG tablet Take 1 tablet Daily   . venlafaxine-XR 150 MG  Take 150 mg 2  times daily.   . vitamin E  180 MG  (400 UNITS) Take  daily.  . zaleplon (SONATA) 10 MG cap Take  at bedtime as needed for sleep.      Allergies  Allergen Reactions  . Penicillins Anaphylaxis and Rash  . Sulfa Antibiotics Shortness Of Breath    Also light headed, rash  . Celebrex [Celecoxib] Elevates Blood Pressure  . Ciprofloxacin Nausea And Vomiting  . Codeine / hydrocodone Nausea And Vomiting  . Levaquin [Levofloxacin] Nausea And Vomiting  . Motrin Ib [Ibuprofen] Hives  . Prempro [Conj Estrog-Medroxyprogest Ace] Hives  PMHx:   Past Medical History:  Diagnosis Date  . Abscessed tooth   . Anemia   . Anxiety   . Arthritis   . Bell's palsy 01/1985  . Complication of anesthesia   . COPD (chronic obstructive pulmonary disease) ([place])   . Depression   . Diabetes mellitus without complication ([place])   . Fibroid uterus 12/20/2014   7cm, 3.4cm and 1.9cm fibroids   . GERD (gastroesophageal reflux disease)   . Hypertension   . IBS (irritable bowel syndrome) 12/89  . Obesity hypoventilation syndrome ([place]) 11/19/2014  . Obesity, Class III, BMI 40-49.9 (morbid obesity) ([place])   . OSA (obstructive sleep apnea)    no cpap  . Vitamin D deficiency     Immunization History  Administered Date(s) Administered  . DT (Pediatric) 05/21/2015  . Influenza, High Dose Seasonal PF 01/26/2019, 12/08/2019  . Influenza,inj,Quad PF,6+ Mos 12/02/2016  . Influenza,inj,quad, With  Preservative 12/09/2015  . Influenza-Unspecified 12/07/2016  . Moderna Sars-Covid-2 Vaccination 04/24/2019, 05/23/2019, 01/17/2020  . PPD Test 03/21/2013, 05/21/2015  . Pneumococcal Conjugate-13 01/04/2014  . Pneumococcal Polysaccharide-23 03/09/2004, 11/11/2017  . Td 03/09/2004  . Tdap 03/10/2015  . Typhoid Inactivated 05/10/2014  . Zoster 12/22/2012    Past Surgical History:  Procedure Laterality Date  . BLADDER SUSPENSION  03/2006, 07/2006, 01/2007   for stress incontinence  . CHOLECYSTECTOMY N/A 12/08/2018   Procedure: LAPAROSCOPIC CHOLECYSTECTOMY;  Surgeon: Coralie Keens, MD;  Location: [facility];  Service: General;  Laterality: N/A;  . COLONOSCOPY  05/2005   Normal, recheck 10 yrs  . LUMBAR FUSION Right 09/2016   L4-5 fusion by Dr. Sherley Bounds  . TONSILLECTOMY AND ADENOIDECTOMY    . TOTAL KNEE ARTHROPLASTY Left 2012   x 2 left knee  . TOTAL KNEE REVISION  2014  . TOTAL KNEE REVISION Left 08/2018   Dr Tracie Harrier, [facility]    FHx:    Reviewed / unchanged  SHx:    Reviewed / unchanged   Systems Review:  Constitutional: Denies fever, chills, wt changes, headaches, insomnia, fatigue, night sweats, change in appetite. Eyes: Denies redness, blurred vision, diplopia, discharge, itchy, watery eyes.  ENT: Denies discharge, congestion, post nasal drip, epistaxis, sore throat, earache, hearing loss, dental pain, tinnitus, vertigo, sinus pain, snoring.  CV: Denies chest pain, palpitations, irregular heartbeat, syncope, dyspnea, diaphoresis, orthopnea, PND, claudication or edema. Respiratory: denies cough, dyspnea, DOE, pleurisy, hoarseness, laryngitis, wheezing.  Gastrointestinal: Denies dysphagia, odynophagia, heartburn, reflux, water brash, abdominal pain or cramps, nausea, vomiting, bloating, diarrhea, constipation, hematemesis, melena, hematochezia  or hemorrhoids. Genitourinary: Denies dysuria, frequency, urgency, nocturia, hesitancy, discharge, hematuria or flank  pain. Musculoskeletal: Denies arthralgias, myalgias, stiffness, jt. swelling, pain, limping or strain/sprain.  Skin: Denies pruritus, rash, hives, warts, acne, eczema or change in skin lesion(s). Neuro: No weakness, tremor, incoordination, spasms, paresthesia or pain. Psychiatric: Denies confusion, memory loss or sensory loss. Endo: Denies change in weight, skin or hair change.  Heme/Lymph: No excessive bleeding, bruising or enlarged lymph nodes.  Physical Exam  BP 114/84   Pulse 86   Temp (!) 97.3 F (36.3 C)   Resp 16   Ht 5\' 5"  (1.651 m)   Wt 183 lb 3.2 oz (83.1 kg)   LMP 04/09/2009   SpO2 98%   BMI 30.49 kg/m   Appears  over nourished, well groomed  and in no distress.  Eyes: PERRLA, EOMs, conjunctiva no swelling or erythema. Sinuses: No frontal/maxillary tenderness ENT/Mouth: EAC's clear, TM's nl w/o erythema, bulging. Nares clear w/o erythema, swelling,  exudates. Oropharynx clear without erythema or exudates. Oral hygiene is good. Tongue normal, non obstructing. Hearing intact.  Neck: Supple. Thyroid not palpable. Car 2+/2+ without bruits, nodes or JVD. Chest: Respirations nl with BS clear & equal w/o rales, rhonchi, wheezing or stridor.  Cor: Heart sounds normal w/ regular rate and rhythm without sig. murmurs, gallops, clicks or rubs. Peripheral pulses normal and equal  without edema.  Abdomen: Soft & bowel sounds normal. Non-tender w/o guarding, rebound, hernias, masses or organomegaly.  Lymphatics: Unremarkable.  Musculoskeletal: Full ROM all peripheral extremities, joint stability, 5/5 strength and normal gait.  Skin: Warm, dry without exposed rashes, lesions or ecchymosis apparent.  Neuro: Cranial nerves intact, reflexes equal bilaterally. Sensory-motor testing grossly intact. Tendon reflexes grossly intact.  Pysch: Alert & oriented x 3.  Insight and judgement nl & appropriate. No ideations.  Assessment and Plan:  1. Essential hypertension  - Continue medication,  monitor blood pressure at home.  - Continue DASH diet.  Reminder to go to the ER if any CP,  SOB, nausea, dizziness, severe HA, changes vision/speech.  - CBC with Differential/Platelet - COMPLETE METABOLIC PANEL WITH GFR - Magnesium - TSH  2. Hyperlipidemia associated with type 2 diabetes mellitus ([place])  - Continue diet/meds, exercise,& lifestyle modifications.  - Continue monitor periodic cholesterol/liver & renal functions   - Lipid panel - TSH  3. Controlled type 2 diabetes mellitus with stage 2 chronic  kidney disease, without long-term current use of insulin ([facility])  - Continue diet, exercise  - Lifestyle modifications.  - Monitor appropriate labs  - Hemoglobin A1c - Insulin, random  4. Vitamin D deficiency  - Continue supplementation.  - VITAMIN D 25 Hydroxy  5. Aortic atherosclerosis by CTscan Jan 2021  - Lipid panel  6. OSA and COPD overlap syndrome ([place])   7. Medication management  - CBC with Differential/Platelet - COMPLETE METABOLIC PANEL WITH GFR - Magnesium - Lipid panel - TSH - Hemoglobin A1c - Insulin, random - VITAMIN D 25 Hydroxy         Discussed  regular exercise, BP monitoring, weight control to achieve/maintain BMI less than 25 and discussed med and SE's. Recommended labs to assess and monitor clinical status with further disposition pending results of labs.  I discussed the assessment and treatment plan with the patient. The patient was provided an opportunity to ask questions and all were answered. The patient agreed with the plan and demonstrated an understanding of the instructions.  I provided over 30 minutes of exam, counseling, chart review and  complex critical decision making.         The patient was advised to call back or seek an in-person evaluation if the symptoms worsen or if the condition fails to improve as anticipated.   Kirtland Bouchard, MD

## 2020-05-13 NOTE — Patient Instructions (Signed)

## 2020-05-14 ENCOUNTER — Other Ambulatory Visit: Payer: Self-pay

## 2020-05-14 ENCOUNTER — Ambulatory Visit (INDEPENDENT_AMBULATORY_CARE_PROVIDER_SITE_OTHER): Payer: Medicare Other | Admitting: Internal Medicine

## 2020-05-14 VITALS — BP 114/84 | HR 86 | Temp 97.3°F | Resp 16 | Ht 65.0 in | Wt 183.2 lb

## 2020-05-14 DIAGNOSIS — E1169 Type 2 diabetes mellitus with other specified complication: Secondary | ICD-10-CM | POA: Diagnosis not present

## 2020-05-14 DIAGNOSIS — I10 Essential (primary) hypertension: Secondary | ICD-10-CM

## 2020-05-14 DIAGNOSIS — E1122 Type 2 diabetes mellitus with diabetic chronic kidney disease: Secondary | ICD-10-CM | POA: Diagnosis not present

## 2020-05-14 DIAGNOSIS — E559 Vitamin D deficiency, unspecified: Secondary | ICD-10-CM

## 2020-05-14 DIAGNOSIS — E785 Hyperlipidemia, unspecified: Secondary | ICD-10-CM

## 2020-05-14 DIAGNOSIS — I700 Atherosclerosis of aorta: Secondary | ICD-10-CM

## 2020-05-14 DIAGNOSIS — Z79899 Other long term (current) drug therapy: Secondary | ICD-10-CM

## 2020-05-14 DIAGNOSIS — J449 Chronic obstructive pulmonary disease, unspecified: Secondary | ICD-10-CM

## 2020-05-14 DIAGNOSIS — G4733 Obstructive sleep apnea (adult) (pediatric): Secondary | ICD-10-CM

## 2020-05-14 DIAGNOSIS — N182 Chronic kidney disease, stage 2 (mild): Secondary | ICD-10-CM

## 2020-05-15 LAB — CBC WITH DIFFERENTIAL/PLATELET
Absolute Monocytes: 428 cells/uL (ref 200–950)
Basophils Absolute: 51 cells/uL (ref 0–200)
Basophils Relative: 1 %
Eosinophils Absolute: 352 cells/uL (ref 15–500)
Eosinophils Relative: 6.9 %
HCT: 41.3 % (ref 35.0–45.0)
Hemoglobin: 13.6 g/dL (ref 11.7–15.5)
Lymphs Abs: 1688 cells/uL (ref 850–3900)
MCH: 30.2 pg (ref 27.0–33.0)
MCHC: 32.9 g/dL (ref 32.0–36.0)
MCV: 91.8 fL (ref 80.0–100.0)
MPV: 11.7 fL (ref 7.5–12.5)
Monocytes Relative: 8.4 %
Neutro Abs: 2581 cells/uL (ref 1500–7800)
Neutrophils Relative %: 50.6 %
Platelets: 186 10*3/uL (ref 140–400)
RBC: 4.5 10*6/uL (ref 3.80–5.10)
RDW: 13.3 % (ref 11.0–15.0)
Total Lymphocyte: 33.1 %
WBC: 5.1 10*3/uL (ref 3.8–10.8)

## 2020-05-15 LAB — TSH: TSH: 3.09 mIU/L (ref 0.40–4.50)

## 2020-05-15 LAB — COMPLETE METABOLIC PANEL WITH GFR
AG Ratio: 1.4 (calc) (ref 1.0–2.5)
ALT: 49 U/L — ABNORMAL HIGH (ref 6–29)
AST: 40 U/L — ABNORMAL HIGH (ref 10–35)
Albumin: 3.7 g/dL (ref 3.6–5.1)
Alkaline phosphatase (APISO): 233 U/L — ABNORMAL HIGH (ref 37–153)
BUN: 22 mg/dL (ref 7–25)
CO2: 31 mmol/L (ref 20–32)
Calcium: 9 mg/dL (ref 8.6–10.4)
Chloride: 105 mmol/L (ref 98–110)
Creat: 0.78 mg/dL (ref 0.50–0.99)
GFR, Est African American: 91 mL/min/{1.73_m2} (ref >=60)
GFR, Est Non African American: 79 mL/min/{1.73_m2} (ref >=60)
Globulin: 2.7 g/dL (calc) (ref 1.9–3.7)
Glucose, Bld: 79 mg/dL (ref 65–99)
Potassium: 4.8 mmol/L (ref 3.5–5.3)
Sodium: 141 mmol/L (ref 135–146)
Total Bilirubin: 0.3 mg/dL (ref 0.2–1.2)
Total Protein: 6.4 g/dL (ref 6.1–8.1)

## 2020-05-15 LAB — HEMOGLOBIN A1C
Hgb A1c MFr Bld: 5.3 % of total Hgb (ref <5.7)
Mean Plasma Glucose: 105 mg/dL
eAG (mmol/L): 5.8 mmol/L

## 2020-05-15 LAB — LIPID PANEL
Cholesterol: 132 mg/dL (ref <200)
HDL: 48 mg/dL — ABNORMAL LOW (ref >=50)
LDL Cholesterol (Calc): 71 mg/dL (calc)
Non-HDL Cholesterol (Calc): 84 mg/dL (calc) (ref <130)
Total CHOL/HDL Ratio: 2.8 (calc) (ref <5.0)
Triglycerides: 45 mg/dL (ref <150)

## 2020-05-15 LAB — INSULIN, RANDOM: Insulin: 5.7 u[IU]/mL

## 2020-05-15 LAB — VITAMIN D 25 HYDROXY (VIT D DEFICIENCY, FRACTURES): Vit D, 25-Hydroxy: 97 ng/mL (ref 30–100)

## 2020-05-15 LAB — MAGNESIUM: Magnesium: 2.3 mg/dL (ref 1.5–2.5)

## 2020-05-15 NOTE — Progress Notes (Signed)
============================================================ -   Test results slightly outside the reference range are not unusual. If there is anything important, I will review this with you,  otherwise it is considered normal test values.  If you have further questions,  please do not hesitate to contact me at the office or via My Chart.  ============================================================ ============================================================ (  Labs copied to Dr  Verdia Kuba )  ============================================================ ============================================================  -  CMET shows Kidney functions  Stable in Stage 2  -->>                                                   slightly improved - GFR up from 76  -->> 79 ============================================================ ============================================================  -  Alkaline Phosphatase liver enzyme is still elevated and most likely                              due to "fatty Liver" - the treatment is of course "Weight Loss" ============================================================ ============================================================  -  Total Chol = 132  and LDL Chol = 71 - Both  Excellent  !  - Very low risk for Heart Attack  / Stroke ============================================================ ============================================================  -  A1c = 5.3% - Wonderful - Remaining well in the Normal Range curing  your Diabetes after your 90 # weight Loss in the last 1.5-2 years !   ============================================================ ============================================================  -  Vitamin D = 97 - Excellent  !  - Vitamin D goal is between 70-100.   - It is very important as a natural anti-inflammatory and helping the  immune system protect against viral infections, like the Covid-19   - helping hair, skin, and  nails, as well as reducing stroke and  heart attack risk.   - It helps your bones and helps with mood.  - It also decreases numerous cancer risks so please  take it as directed.   - Low Vit D is associated with a 200-300% higher risk for  CANCER   and 200-300% higher risk for HEART   ATTACK  &  STROKE.    - It is also associated with higher death rate at younger ages,   - autoimmune diseases like Rheumatoid arthritis, Lupus,  Multiple Sclerosis.     - Also many other serious conditions, like depression, Alzheimer's Dementia,                                                  infertility, muscle aches, fatigue, fibromyalgia   - just to name a few. ============================================================ ============================================================  -  All Else - CBC - Kidneys - Electrolytes - Liver - Magnesium & Thyroid    - all  Normal / OK ============================================================ ============================================================                    -

## 2020-05-20 ENCOUNTER — Other Ambulatory Visit: Payer: Self-pay | Admitting: Internal Medicine

## 2020-05-20 DIAGNOSIS — E782 Mixed hyperlipidemia: Secondary | ICD-10-CM

## 2020-05-20 DIAGNOSIS — E1122 Type 2 diabetes mellitus with diabetic chronic kidney disease: Secondary | ICD-10-CM

## 2020-08-12 ENCOUNTER — Ambulatory Visit: Payer: Medicare Other

## 2020-09-03 LAB — HM DIABETES EYE EXAM

## 2020-09-05 ENCOUNTER — Encounter: Payer: Self-pay | Admitting: Internal Medicine

## 2020-09-09 ENCOUNTER — Other Ambulatory Visit: Payer: Self-pay | Admitting: Internal Medicine

## 2020-09-09 DIAGNOSIS — K219 Gastro-esophageal reflux disease without esophagitis: Secondary | ICD-10-CM

## 2020-09-09 DIAGNOSIS — Z79899 Other long term (current) drug therapy: Secondary | ICD-10-CM

## 2020-09-09 MED ORDER — METOCLOPRAMIDE HCL 10 MG PO TABS: ORAL_TABLET | ORAL | 3 refills | Status: DC

## 2020-10-02 LAB — HM MAMMOGRAPHY

## 2020-10-08 ENCOUNTER — Encounter: Payer: Self-pay | Admitting: Internal Medicine

## 2020-10-20 ENCOUNTER — Encounter: Payer: Self-pay | Admitting: Internal Medicine

## 2020-10-20 NOTE — Patient Instructions (Signed)
Due to recent changes in healthcare laws, you Dudash see the results of your imaging and laboratory studies on MyChart before your provider has had a chance to review them.  We understand that in some cases there Gin be results that are confusing or concerning to you. Not all laboratory results come back in the same time frame and the provider Hoback be waiting for multiple results in order to interpret others.  Please give [place] 48 hours in order for your provider to thoroughly review all the results before contacting the office for clarification of your results.   +++++++++++++++++++++++++  Vit D  & Vit C 1,000 mg   are recommended to help protect  against the Covid-19 and other Corona viruses.    Also it's recommended  to take  Zinc 50 mg  to help  protect against the Covid-19   and best place to get  is also on [email or web address]  and don't pay more than 6-8 cents /pill !  ================================ Coronavirus (COVID-19) Are you at risk?  Are you at risk for the Coronavirus (COVID-19)?  To be considered HIGH RISK for Coronavirus (COVID-19), you have to meet the following criteria:  Traveled to [place], [place], [place], [place] or [place]; or in the [place] to [place], [place], [place]  or [place]; and have fever, cough, and shortness of breath within the last 2 weeks of travel OR Been in close contact with a person diagnosed with COVID-19 within the last 2 weeks and have  fever, cough,and shortness of breath  IF YOU DO NOT MEET THESE CRITERIA, YOU ARE CONSIDERED LOW RISK FOR COVID-19.  What to do if you are HIGH RISK for COVID-19?  If you are having a medical emergency, call 911. Seek medical care right away. Before you go to a doctor's office, urgent care or emergency department,  call ahead and tell them about your recent travel, contact with someone diagnosed with COVID-19   and your symptoms.  You should receive instructions from your physician's office regarding next  steps of care.  When you arrive at healthcare provider, tell the healthcare staff immediately you have returned from  visiting [place], [place], [place], [place] or [place]; or traveled in the [place] to [place], [place],  [place] or [place] in the last two weeks or you have been in close contact with a person diagnosed with  COVID-19 in the last 2 weeks.   Tell the health care staff about your symptoms: fever, cough and shortness of breath. After you have been seen by a medical provider, you will be either: Tested for (COVID-19) and discharged home on quarantine except to seek medical care if  symptoms worsen, and asked to  Stay home and avoid contact with others until you get your results (4-5 days)  Avoid travel on public transportation if possible (such as bus, train, or airplane) or Sent to the Emergency Department by EMS for evaluation, COVID-19 testing  and  possible admission depending on your condition and test results.  What to do if you are LOW RISK for COVID-19?  Reduce your risk of any infection by using the same precautions used for avoiding the common cold or flu:  Wash your hands often with soap and warm water for at least 20 seconds.  If soap and water are not readily available,  use an alcohol-based hand sanitizer with at least 60% alcohol.  If coughing or sneezing, cover your mouth and nose by coughing  or sneezing into the elbow areas of your shirt or coat,  into a tissue or into your sleeve (not your hands). Avoid shaking hands with others and consider head nods or verbal greetings only. Avoid touching your eyes, nose, or mouth with unwashed hands.  Avoid close contact with people who are sick. Avoid places or events with large numbers of people in one location, like concerts or sporting events. Carefully consider travel plans you have or are making. If you are planning any travel outside or inside the [place], visit the CDC's Travelers' Health webpage for the  latest health notices. If you have some symptoms but not all symptoms, continue to monitor at home and seek medical attention  if your symptoms worsen. If you are having a medical emergency, call 911.   >>>>>>>>>>>>>>>>>>>>>>>>>>>>>>>>> We Do NOT Approve of  [facility], [employer]  Our Patients  To Do Home Visits & We Do NOT Approve of [employer] > > > > > > > > > > > > > > > > > > > > > > > > > > > > > > > > > > > > > > >  Preventive Care for Adults  A healthy lifestyle and preventive care can promote health and wellness. Preventive health guidelines for women include the following key practices. A routine yearly physical is a good way to check with your health care provider about your health and preventive screening. It is a chance to share any concerns and updates on your health and to receive a thorough exam. Visit your dentist for a routine exam and preventive care every 6 months. Brush your teeth twice a day and floss once a day. Good oral hygiene prevents tooth decay and gum disease. The frequency of eye exams is based on your age, health, family medical history, use of contact lenses, and other factors. Follow your health care provider's recommendations for frequency of eye exams. Eat a healthy diet. Foods like vegetables, fruits, whole grains, low-fat dairy products, and lean protein foods contain the nutrients you need without too many calories. Decrease your intake of foods high in solid fats, added sugars, and salt. Eat the right amount of calories for you. Get information about a proper diet from your health care provider, if necessary. Regular physical exercise is one of the most important things you can do for your health. Most adults should get at least 150 minutes of moderate-intensity exercise (any activity that increases your heart rate and causes you to sweat) each week. In addition, most adults need muscle-strengthening exercises on 2 or more days  a week. Maintain a healthy weight. The body mass index (BMI) is a screening tool to identify possible weight problems. It provides an estimate of body fat based on height and weight. Your health care provider can find your BMI and can help you achieve or maintain a healthy weight. For adults 20 years and older: A BMI below 18.5 is considered underweight. A BMI of 18.5 to 24.9 is normal. A BMI of 25 to 29.9 is considered overweight. A BMI of 30 and above is considered obese. Maintain normal blood lipids and cholesterol levels by exercising and minimizing your intake of saturated fat. Eat a balanced diet with plenty of fruit and vegetables. If your lipid or cholesterol levels are high, you are over 50, or you are at high risk for heart disease, you may need your cholesterol levels checked more frequently. Ongoing high lipid and cholesterol levels should  be treated with medicines if diet and exercise are not working. If you smoke, find out from your health care provider how to quit. If you do not use tobacco, do not start. Lung cancer screening is recommended for adults aged 35-80 years who are at high risk for developing lung cancer because of a history of smoking. A yearly low-dose CT scan of the lungs is recommended for people who have at least a 30-pack-year history of smoking and are a current smoker or have quit within the past 15 years. A pack year of smoking is smoking an average of 1 pack of cigarettes a day for 1 year (for example: 1 pack a day for 30 years or 2 packs a day for 15 years). Yearly screening should continue until the smoker has stopped smoking for at least 15 years. Yearly screening should be stopped for people who develop a health problem that would prevent them from having lung cancer treatment. Avoid use of street drugs. Do not share needles with anyone. Ask for help if you need support or instructions about stopping the use of drugs. High blood pressure causes heart disease and  increases the risk of stroke.  Ongoing high blood pressure should be treated with medicines if weight loss and exercise do not work. If you are 56-37 years old, ask your health care provider if you should take aspirin to prevent strokes. Diabetes screening involves taking a blood sample to check your fasting blood sugar level. This should be done once every 3 years, after age 50, if you are within normal weight and without risk factors for diabetes. Testing should be considered at a younger age or be carried out more frequently if you are overweight and have at least 1 risk factor for diabetes. Breast cancer screening is essential preventive care for women. You should practice "breast self-awareness." This means understanding the normal appearance and feel of your breasts and may include breast self-examination. Any changes detected, no matter how small, should be reported to a health care provider. Women in their 83s and 30s should have a clinical breast exam (CBE) by a health care provider as part of a regular health exam every 1 to 3 years. After age 65, women should have a CBE every year. Starting at age 54, women should consider having a mammogram (breast X-ray test) every year. Women who have a family history of breast cancer should talk to their health care provider about genetic screening. Women at a high risk of breast cancer should talk to their health care providers about having an MRI and a mammogram every year. Breast cancer gene (BRCA)-related cancer risk assessment is recommended for women who have family members with BRCA-related cancers. BRCA-related cancers include breast, ovarian, tubal, and peritoneal cancers. Having family members with these cancers may be associated with an increased risk for harmful changes (mutations) in the breast cancer genes BRCA1 and BRCA2. Results of the assessment will determine the need for genetic counseling and BRCA1 and BRCA2 testing. Routine pelvic exams to  screen for cancer are no longer recommended for nonpregnant women who are considered low risk for cancer of the pelvic organs (ovaries, uterus, and vagina) and who do not have symptoms. Ask your health care provider if a screening pelvic exam is right for you. If you have had past treatment for cervical cancer or a condition that could lead to cancer, you need Pap tests and screening for cancer for at least 20 years after your treatment. If Pap  tests have been discontinued, your risk factors (such as having a new sexual partner) need to be reassessed to determine if screening should be resumed. Some women have medical problems that increase the chance of getting cervical cancer. In these cases, your health care provider may recommend more frequent screening and Pap tests.  Colorectal cancer can be detected and often prevented. Most routine colorectal cancer screening begins at the age of 22 years and continues through age 47 years. However, your health care provider may recommend screening at an earlier age if you have risk factors for colon cancer. On a yearly basis, your health care provider may provide home test kits to check for hidden blood in the stool. Use of a small camera at the end of a tube, to directly examine the colon (sigmoidoscopy or colonoscopy), can detect the earliest forms of colorectal cancer. Talk to your health care provider about this at age 55, when routine screening begins.  Direct exam of the colon should be repeated every 5-10 years through age 22 years, unless early forms of pre-cancerous polyps or small growths are found. Osteoporosis is a disease in which the bones lose minerals and strength with aging. This can result in serious bone fractures or breaks. The risk of osteoporosis can be identified using a bone density scan. Women ages 19 years and over and women at risk for fractures or osteoporosis should discuss screening with their health care providers. Ask your health care  provider whether you should take a calcium supplement or vitamin D to reduce the rate of osteoporosis. Menopause can be associated with physical symptoms and risks. Hormone replacement therapy is available to decrease symptoms and risks. You should talk to your health care provider about whether hormone replacement therapy is right for you. Use sunscreen. Apply sunscreen liberally and repeatedly throughout the day. You should seek shade when your shadow is shorter than you. Protect yourself by wearing long sleeves, pants, a wide-brimmed hat, and sunglasses year round, whenever you are outdoors. Once a month, do a whole body skin exam, using a mirror to look at the skin on your back. Tell your health care provider of new moles, moles that have irregular borders, moles that are larger than a pencil eraser, or moles that have changed in shape or color. Stay current with required vaccines (immunizations). Influenza vaccine. All adults should be immunized every year. Tetanus, diphtheria, and acellular pertussis (Td, Tdap) vaccine. Pregnant women should receive 1 dose of Tdap vaccine during each pregnancy. The dose should be obtained regardless of the length of time since the last dose. Immunization is preferred during the 27th-36th week of gestation. An adult who has not previously received Tdap or who does not know her vaccine status should receive 1 dose of Tdap. This initial dose should be followed by tetanus and diphtheria toxoids (Td) booster doses every 10 years. Adults with an unknown or incomplete history of completing a 3-dose immunization series with Td-containing vaccines should begin or complete a primary immunization series including a Tdap dose. Adults should receive a Td booster every 10 years.  Zoster vaccine. One dose is recommended for adults aged 65 years or older unless certain conditions are present.  Pneumococcal 13-valent conjugate (PCV13) vaccine. When indicated, a person who is  uncertain of her immunization history and has no record of immunization should receive the PCV13 vaccine. An adult aged 60 years or older who has certain medical conditions and has not been previously immunized should receive 1 dose of  PCV13 vaccine. This PCV13 should be followed with a dose of pneumococcal polysaccharide (PPSV23) vaccine. The PPSV23 vaccine dose should be obtained at least 1 or more year(s) after the dose of PCV13 vaccine. An adult aged 36 years or older who has certain medical conditions and previously received 1 or more doses of PPSV23 vaccine should receive 1 dose of PCV13. The PCV13 vaccine dose should be obtained 1 or more years after the last PPSV23 vaccine dose.  Pneumococcal polysaccharide (PPSV23) vaccine. When PCV13 is also indicated, PCV13 should be obtained first. All adults aged 16 years and older should be immunized. An adult younger than age 12 years who has certain medical conditions should be immunized. Any person who resides in a nursing home or long-term care facility should be immunized. An adult smoker should be immunized. People with an immunocompromised condition and certain other conditions should receive both PCV13 and PPSV23 vaccines. People with human immunodeficiency virus (HIV) infection should be immunized as soon as possible after diagnosis. Immunization during chemotherapy or radiation therapy should be avoided. Routine use of PPSV23 vaccine is not recommended for American Indians, [place] Natives, or people younger than 65 years unless there are medical conditions that require PPSV23 vaccine. When indicated, people who have unknown immunization and have no record of immunization should receive PPSV23 vaccine. One-time revaccination 5 years after the first dose of PPSV23 is recommended for people aged 19-64 years who have chronic kidney failure, nephrotic syndrome, asplenia, or immunocompromised conditions. People who received 1-2 doses of PPSV23 before age 60 years  should receive another dose of PPSV23 vaccine at age 53 years or later if at least 5 years have passed since the previous dose. Doses of PPSV23 are not needed for people immunized with PPSV23 at or after age 37 years.  Preventive Services / Frequency  Ages 1 years and over Blood pressure check. Lipid and cholesterol check. Lung cancer screening. / Every year if you are aged 32-80 years and have a 30-pack-year history of smoking and currently smoke or have quit within the past 15 years. Yearly screening is stopped once you have quit smoking for at least 15 years or develop a health problem that would prevent you from having lung cancer treatment. Clinical breast exam.** / Every year after age 45 years.  BRCA-related cancer risk assessment.** / For women who have family members with a BRCA-related cancer (breast, ovarian, tubal, or peritoneal cancers). Mammogram.** / Every year beginning at age 5 years and continuing for as long as you are in good health. Consult with your health care provider. Pap test.** / Every 3 years starting at age 51 years through age 41 or 58 years with 3 consecutive normal Pap tests. Testing can be stopped between 65 and 70 years with 3 consecutive normal Pap tests and no abnormal Pap or HPV tests in the past 10 years. Fecal occult blood test (FOBT) of stool. / Every year beginning at age 51 years and continuing until age 76 years. You may not need to do this test if you get a colonoscopy every 10 years. Flexible sigmoidoscopy or colonoscopy.** / Every 5 years for a flexible sigmoidoscopy or every 10 years for a colonoscopy beginning at age 61 years and continuing until age 81 years. Hepatitis C blood test.** / For all people born from [birth year] through 1963 and any individual with known risks for hepatitis C. Osteoporosis screening.** / A one-time screening for women ages 12 years and over and women at risk for fractures or  osteoporosis. Skin self-exam. / Monthly. Influenza  vaccine. / Every year. Tetanus, diphtheria, and acellular pertussis (Tdap/Td) vaccine.** / 1 dose of Td every 10 years. Zoster vaccine.** / 1 dose for adults aged 64 years or older. Pneumococcal 13-valent conjugate (PCV13) vaccine.** / Consult your health care provider. Pneumococcal polysaccharide (PPSV23) vaccine.** / 1 dose for all adults aged 25 years and older. Screening for abdominal aortic aneurysm (AAA)  by ultrasound is recommended for people who have history of high blood pressure or who are current or former smokers. ++++++++++++++++++++ Recommend Adult Low Dose Aspirin or  coated  Aspirin 81 mg daily  To reduce risk of Colon Cancer 40 %,  Skin Cancer 26 % ,  Melanoma 46%  and  Pancreatic cancer 60% ++++++++++++++++++++ Vitamin D goal  is between 70-100.  Please make sure that you are taking your Vitamin D as directed.  It is very important as a natural anti-inflammatory  helping hair, skin, and nails, as well as reducing stroke and heart attack risk.  It helps your bones and helps with mood. It also decreases numerous cancer risks so please take it as directed.  Low Vit D is associated with a 200-300% higher risk for CANCER  and 200-300% higher risk for HEART   ATTACK  &  STROKE.   .....................................Marland Kitchen It is also associated with higher death rate at younger ages,  autoimmune diseases like Rheumatoid arthritis, Lupus, Multiple Sclerosis.    Also many other serious conditions, like depression, Alzheimer's Dementia, infertility, muscle aches, fatigue, fibromyalgia - just to name a few. ++++++++++++++++++ Recommend the book "The END of DIETING" by Dr Excell Seltzer  & the book "The END of DIABETES " by Dr Excell Seltzer At [email or web address] - get book & Audio CD's    Being diabetic has a  300% increased risk for heart attack, stroke, cancer, and alzheimer- type vascular dementia. It is very important that you work harder with diet by avoiding all foods that are white.  Avoid white rice (brown & wild rice is OK), white potatoes (sweetpotatoes in moderation is OK), White bread or wheat bread or anything made out of white flour like bagels, donuts, rolls, buns, biscuits, cakes, pastries, cookies, pizza crust, and pasta (made from white flour & egg whites) - vegetarian pasta or spinach or wheat pasta is OK. Multigrain breads like Arnold's or [employer], or multigrain sandwich thins or flatbreads.  Diet, exercise and weight loss can reverse and cure diabetes in the early stages.  Diet, exercise and weight loss is very important in the control and prevention of complications of diabetes which affects every system in your body, ie. Brain - dementia/stroke, eyes - glaucoma/blindness, heart - heart attack/heart failure, kidneys - dialysis, stomach - gastric paralysis, intestines - malabsorption, nerves - severe painful neuritis, circulation - gangrene & loss of a leg(s), and finally cancer and Alzheimers.    I recommend avoid fried & greasy foods,  sweets/candy, white rice (brown or wild rice or Quinoa is OK), white potatoes (sweet potatoes are OK) - anything made from white flour - bagels, doughnuts, rolls, buns, biscuits,white and wheat breads, pizza crust and traditional pasta made of white flour & egg white(vegetarian pasta or spinach or wheat pasta is OK).  Multi-grain bread is OK - like multi-grain flat bread or sandwich thins. Avoid alcohol in excess. Exercise is also important.    Eat all the vegetables you want - avoid meat, especially red meat and dairy - especially cheese.  Cheese is the most  concentrated form of trans-fats which is the worst thing to clog up our arteries. Veggie cheese is OK which can be found in the fresh produce section at [employer] or [employer] or [employer]  +++++++++++++++++++ DASH Eating Plan  DASH stands for "Dietary Approaches to Stop Hypertension."   The DASH eating plan is a healthy eating plan that has been shown to reduce high  blood pressure (hypertension). Additional health benefits may include reducing the risk of type 2 diabetes mellitus, heart disease, and stroke. The DASH eating plan may also help with weight loss. WHAT DO I NEED TO KNOW ABOUT THE DASH EATING PLAN? For the DASH eating plan, you will follow these general guidelines: Choose foods with a percent daily value for sodium of less than 5% (as listed on the food label). Use salt-free seasonings or herbs instead of table salt or sea salt. Check with your health care provider or pharmacist before using salt substitutes. Eat lower-sodium products, often labeled as "lower sodium" or "no salt added." Eat fresh foods. Eat more vegetables, fruits, and low-fat dairy products. Choose whole grains. Look for the word "whole" as the first word in the ingredient list. Choose fish  Limit sweets, desserts, sugars, and sugary drinks. Choose heart-healthy fats. Eat veggie cheese  Eat more home-cooked food and less restaurant, buffet, and fast food. Limit fried foods. Cook foods using methods other than frying. Limit canned vegetables. If you do use them, rinse them well to decrease the sodium. When eating at a restaurant, ask that your food be prepared with less salt, or no salt if possible.                      WHAT FOODS CAN I EAT? Read Dr Fara Olden Fuhrman's books on The End of Dieting & The End of Diabetes  Grains Whole grain or whole wheat bread. Brown rice. Whole grain or whole wheat pasta. Quinoa, bulgur, and whole grain cereals. Low-sodium cereals. Corn or whole wheat flour tortillas. Whole grain cornbread. Whole grain crackers. Low-sodium crackers.  Vegetables Fresh or frozen vegetables (raw, steamed, roasted, or grilled). Low-sodium or reduced-sodium tomato and vegetable juices. Low-sodium or reduced-sodium tomato sauce and paste. Low-sodium or reduced-sodium canned vegetables.   Fruits All fresh, canned (in natural juice), or frozen fruits.  Protein  Products  All fish and seafood.  Dried beans, peas, or lentils. Unsalted nuts and seeds. Unsalted canned beans.  Dairy Low-fat dairy products, such as skim or 1% milk, 2% or reduced-fat cheeses, low-fat ricotta or cottage cheese, or plain low-fat yogurt. Low-sodium or reduced-sodium cheeses.  Fats and Oils Tub margarines without trans fats. Light or reduced-fat mayonnaise and salad dressings (reduced sodium). Avocado. Safflower, olive, or canola oils. Natural peanut or almond butter.  Other Unsalted popcorn and pretzels. The items listed above may not be a complete list of recommended foods or beverages. Contact your dietitian for more options.  +++++++++++++++  WHAT FOODS ARE NOT RECOMMENDED? Grains/ White flour or wheat flour White bread. White pasta. White rice. Refined cornbread. Bagels and croissants. Crackers that contain trans fat.  Vegetables  Creamed or fried vegetables. Vegetables in a . Regular canned vegetables. Regular canned tomato sauce and paste. Regular tomato and vegetable juices.  Fruits Dried fruits. Canned fruit in light or heavy syrup. Fruit juice.  Meat and Other Protein Products Meat in general - RED meat & White meat.  Fatty cuts of meat. Ribs, chicken wings, all processed meats as bacon, sausage, bologna, salami, fatback,  hot dogs, bratwurst and packaged luncheon meats.  Dairy Whole or 2% milk, cream, half-and-half, and cream cheese. Whole-fat or sweetened yogurt. Full-fat cheeses or blue cheese. Non-dairy creamers and whipped toppings. Processed cheese, cheese spreads, or cheese curds.  Condiments Onion and garlic salt, seasoned salt, table salt, and sea salt. Canned and packaged gravies. Worcestershire sauce. Tartar sauce. Barbecue sauce. Teriyaki sauce. Soy sauce, including reduced sodium. Steak sauce. Fish sauce. Oyster sauce. Cocktail sauce. Horseradish. Ketchup and mustard. Meat flavorings and tenderizers. Bouillon cubes. Hot sauce. Tabasco sauce.  Marinades. Taco seasonings. Relishes.  Fats and Oils Butter, stick margarine, lard, shortening and bacon fat. Coconut, palm kernel, or palm oils. Regular salad dressings.  Pickles and olives. Salted popcorn and pretzels.  The items listed above may not be a complete list of foods and beverages to avoid.

## 2020-10-20 NOTE — Progress Notes (Signed)
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  Patient has been followed for HTN, HLD, T2_NIDDM and Vitamin D Deficiency. Patient also has GERD controlled on Pepcid, Reglan & Protonix.  Patient has hx/o Major Depression. Patient is on CPAP for OSA with hx/o COPD overlap. Patient is followed in Pain Management clinic by Dr Hardin Negus & sees Dr Mina Marble for EDSI's.  Patient also is requesting treatment for fungal toenails.                HTN predates since 2007. Patient's BP has been controlled at home and patient denies any cardiac symptoms as chest pain, palpitations, shortness of breath, dizziness or ankle swelling. Today's BP is at goal - 122/78.        Patient's hyperlipidemia is controlled with diet and medications. Patient denies myalgias or other medication SE's. 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 Last A1c was normal & at goal:  Lab Results  Component Value Date   HGBA1C 5.3 05/14/2020         Finally, patient has history of Vitamin D Deficiency ("13" /2008) and last Vitamin D was at goal:  Lab Results  Component Value Date   VD25OH 97 05/14/2020     Current Outpatient Medications on File Prior to  Visit  Medication Sig   albuterol HFA  inhaler Use 2 inhalations every 4 hours as needed    ALPRAZolam 0.25 MG tablet Take 1 tablet 2 x / day if needed for anxiety   b complex vitamins capsule Take 1 capsule daily.   COLACE) 100 MG capsule Take  2 (two) times daily.   famotidine 10 MG tablet Take at bedtime.   LINZESS 145 MCG CAPS capsule Take  every morning.   Magnesium 500 MG TABS Take 1 tablet by mouth daily.   metoCLOPramide (REGLAN) 10 MG tablet Take  1 tablet  4 x /day  before Meals & Bedtime    milk thistle 175 MG tablet Take daily.   Omega-3 FISH OIL OMEGA-3 Take 1 capsule daily.   rosuvastatin  10 MG tablet TAKE 1 TABLET DAILY FOR CHOLESTEROL   venlafaxine-XR) 150 MG  Take 2 \\times daily.    VITAMIN E 180 MG (400 UNITS) capsule Take 400 Units  daily.   zaleplon (SONATA) 10 MG capsule Take  at bedtime as needed for sleep.       Allergies  Allergen Reactions   Penicillins Anaphylaxis and Rash   Sulfa Antibiotics Shortness Of Breath    Also light headed, rash   Celebrex [Celecoxib] Other (See Comments)    Elevates Blood Pressure   Ciprofloxacin Nausea And Vomiting   Codeine And hydrocodone Nausea And  Vomiting   Levaquin [Levofloxacin] Nausea And Vomiting   Motrin Ib [Ibuprofen] Hives   Prempro [Conj Estrog-Medroxyprogest Ace] Hives     Past Medical History:  Diagnosis Date   Abscessed tooth    Anemia    Anxiety    Arthritis    Bell's palsy [id number]   Complication of anesthesia    COPD (chronic obstructive pulmonary disease) (HCC)    Depression    Diabetes mellitus without complication ([place])    Fibroid uterus 12/20/2014   7cm, 3.4cm and 1.9cm fibroids    GERD (gastroesophageal reflux disease)    Hypertension    IBS (irritable bowel syndrome) 12/89   Obesity hypoventilation syndrome ([place]) 11/19/2014   Obesity, Class III, BMI 40-49.9 (morbid obesity) ([place])    OSA (obstructive sleep apnea)    Vitamin D deficiency      Health Maintenance  Topic Date Due    Zoster Vaccines- Shingrix (1 of 2) Never done   COLONOSCOPY  10/12/2017   URINE MICROALBUMIN  08/05/2018   COVID-19 Vaccine (4 - Booster for Moderna series) 04/18/2020   INFLUENZA VACCINE  10/07/2020   FOOT EXAM  10/30/2020   HEMOGLOBIN A1C  11/14/2020   OPHTHALMOLOGY EXAM  09/03/2021   MAMMOGRAM  10/03/2022   TETANUS/TDAP  05/20/2025   DEXA SCAN  Completed   Hepatitis C Screening  Completed   PNA vac Low Risk Adult  Completed   HPV VACCINES  Aged Out    Immunization History  Administered Date(s) Administered   DT  05/21/2015   Influenza, High Dose 01/26/2019, 12/08/2019   Influenza,inj 12/02/2016   Influenza,inj,quad 12/09/2015   Influenza- 12/07/2016   Moderna Sars-Covid-2 Vaccination 04/24/2019, 05/23/2019, 01/17/2020   PPD Test 03/21/2013, 05/21/2015   Pneumococcal - 13 01/04/2014   Pneumococcal - 23 03/09/2004, 11/11/2017   Td 03/09/2004   Tdap 03/10/2015   Typhoid Inactivated 05/10/2014   Zoster, Live 12/22/2012    Last Colon -  10/12/2012 - Dr Collene Mares - Recc 10 yr f/u - due Aug 2024   Last MGM - 10/02/2020   Past Surgical History:  Procedure  Date   BLADDER SUSPENSION  03/2006, 07/2006, 01/2007   for stress incontinence   CHOLECYSTECTOMY N/A 12/08/2018   LAP  CHOLECYSTECTOMY; Coralie Keens, MD;    COLONOSCOPY  05/2005   Normal, recheck 10 yrs   LUMBAR FUSION Right 09/2016   L4-5 fusion by Dr. Sherley Bounds   TONSILLECTOMY AND ADENOIDECTOMY     TOTAL KNEE ARTHROPLASTY Left 2012   x 2 left knee   TOTAL KNEE REVISION  2014   TOTAL KNEE REVISION Left 08/2018   Dr Tracie Harrier, [facility]     Family History  Problem Relation Age of Onset   Hypertension Father    Diabetes Father    Parkinson's disease Father    Dementia Mother    Heart attack Paternal Grandfather 75       Died MI     Social History   Tobacco Use   Smoking status: Every Day    Packs/day: 0.75    Years: 47.00    Pack years: 35.25    Types: Cigarettes    Start date: 03/10/1971    Smokeless tobacco: Never   Tobacco comments:    1 pack/day from age [age], now down to 0.5  Vaping Use   Vaping Use: Never used  Substance Use Topics   Alcohol use: No   Drug use: No      ROS Constitutional: Denies fever, chills, weight  loss/gain, headaches, insomnia,  night sweats, and change in appetite. Does c/o fatigue. Eyes: Denies redness, blurred vision, diplopia, discharge, itchy, watery eyes.  ENT: Denies discharge, congestion, post nasal drip, epistaxis, sore throat, earache, hearing loss, dental pain, Tinnitus, Vertigo, Sinus pain, snoring.  Cardio: Denies chest pain, palpitations, irregular heartbeat, syncope, dyspnea, diaphoresis, orthopnea, PND, claudication, edema Respiratory: denies cough, dyspnea, DOE, pleurisy, hoarseness, laryngitis, wheezing.  Gastrointestinal: Denies dysphagia, heartburn, reflux, water brash, pain, cramps, nausea, vomiting, bloating, diarrhea, constipation, hematemesis, melena, hematochezia, jaundice, hemorrhoids Genitourinary: Denies dysuria, frequency, urgency, nocturia, hesitancy, discharge, hematuria, flank pain Breast: Breast lumps, nipple discharge, bleeding.  Musculoskeletal: Denies arthralgia, myalgia, stiffness, Jt. Swelling, pain, limp, and strain/sprain. Denies falls. Skin: Denies puritis, rash, hives, warts, acne, eczema, changing in skin lesion Neuro: No weakness, tremor, incoordination, spasms, paresthesia, pain Psychiatric: Denies confusion, memory loss, sensory loss. Denies Depression. Endocrine: Denies change in weight, skin, hair change, nocturia, and paresthesia, diabetic polys, visual blurring, hyper / hypo glycemic episodes.  Heme/Lymph: No excessive bleeding, bruising, enlarged lymph nodes.  Physical Exam  BP 122/78   Pulse 75   Temp (!) 97.4 F (36.3 C)   Resp 17   Ht '5\' 5"'$  (1.651 m)   Wt 187 lb 9.6 oz (85.1 kg)   LMP 04/09/2009   SpO2 98%   BMI 31.22 kg/m   General Appearance: Well nourished, well groomed and in no  apparent distress.  Eyes: PERRLA, EOMs, conjunctiva no swelling or erythema, normal fundi and vessels. Sinuses: No frontal/maxillary tenderness ENT/Mouth: EACs patent / TMs  nl. Nares clear without erythema, swelling, mucoid exudates. Oral hygiene is good. No erythema, swelling, or exudate. Tongue normal, non-obstructing. Tonsils not swollen or erythematous. Hearing normal.  Neck: Supple, thyroid not palpable. No bruits, nodes or JVD. Respiratory: Respiratory effort normal.  BS equal and clear bilateral without rales, rhonci, wheezing or stridor. Cardio: Heart sounds are normal with regular rate and rhythm and no murmurs, rubs or gallops. Peripheral pulses are normal and equal bilaterally without edema. No aortic or femoral bruits. Chest: symmetric with normal excursions and percussion. Breasts: Symmetric, without lumps, nipple discharge, retractions, or fibrocystic changes.  Abdomen: Flat, soft with bowel sounds active. Nontender, no guarding, rebound, hernias, masses, or organomegaly.  Lymphatics: Non tender without lymphadenopathy.  Genitourinary:  Musculoskeletal: Full ROM all peripheral extremities, joint stability, 5/5 strength, and normal gait. Skin: Warm and dry without rashes, lesions, cyanosis, clubbing or  ecchymosis. (+) chalky thickened dystrophic Lt  1st toenail.  Neuro: Cranial nerves intact, reflexes equal bilaterally. Normal muscle tone, no cerebellar symptoms. Sensation intact.  Pysch: Alert and oriented X 3, normal affect, Insight and Judgment appropriate.    Assessment and Plan  1. Annual Preventative Screening Examination   2. Morbid obesity ([place]) - BMI 30+ with OSA, T2DM, htn, hyperlipidemia   3. Essential hypertension  - EKG 12-Lead - Urinalysis, Routine w reflex microscopic - Microalbumin / creatinine urine ratio - CBC with Differential/Platelet - COMPLETE METABOLIC PANEL WITH GFR - Magnesium - TSH  4. Hyperlipidemia associated with type 2 diabetes  mellitus (HCC)  - Lipid panel - TSH  5. Controlled type 2 diabetes mellitus with stage 2 chronic kidney  disease, without long-term current use of insulin (HCC)  - Urinalysis, Routine w reflex microscopic - Microalbumin / creatinine urine ratio - HM DIABETES FOOT EXAM - LOW EXTREMITY NEUR EXAM DOCUM - Hemoglobin A1c - Insulin, random  6. Vitamin D deficiency  - Lipid panel - VITAMIN D 25 Hydroxy (Vit-D Deficiency, Fractures)  7. Aortic atherosclerosis by CTscan Jan 2021  - EKG 12-Lead  8. OSA and COPD overlap syndrome ([place])   9. Depression, major, in remission ([place])  - TSH  10. Onychomycosis of toenail  - terbinafine (LAMISIL) 250 MG tablet;  Take 1 tablet Daily for Nail Fungus   Dispense: 90 tablet; Refill: 1  11. Screening for colorectal cancer  - POC Hemoccult Bld/Stl   12. Screening for ischemic heart disease  - EKG 12-Lead  13. FHx: heart disease  - EKG 12-Lead  14. Smoker  - EKG 12-Lead  15. Medication management  - CBC with Differential/Platelet - COMPLETE METABOLIC PANEL WITH GFR - Magnesium - Lipid panel - TSH - Hemoglobin A1c - Insulin, random - VITAMIN D 25 Hydroxy          Patient was counseled in prudent diet to achieve/maintain BMI less than 25 for weight control, BP monitoring, regular exercise and medications. Discussed med's effects and SE's. Screening labs and tests as requested with regular follow-up as recommended. Over 40 minutes of exam, counseling, chart review and high complex critical decision making was performed.   Kirtland Bouchard, MD

## 2020-10-21 ENCOUNTER — Other Ambulatory Visit: Payer: Self-pay

## 2020-10-21 ENCOUNTER — Ambulatory Visit (INDEPENDENT_AMBULATORY_CARE_PROVIDER_SITE_OTHER): Payer: Medicare Other | Admitting: Internal Medicine

## 2020-10-21 VITALS — BP 122/78 | HR 75 | Temp 97.4°F | Resp 17 | Ht 65.0 in | Wt 187.6 lb

## 2020-10-21 DIAGNOSIS — I10 Essential (primary) hypertension: Secondary | ICD-10-CM | POA: Diagnosis not present

## 2020-10-21 DIAGNOSIS — Z0000 Encounter for general adult medical examination without abnormal findings: Secondary | ICD-10-CM

## 2020-10-21 DIAGNOSIS — Z136 Encounter for screening for cardiovascular disorders: Secondary | ICD-10-CM

## 2020-10-21 DIAGNOSIS — B351 Tinea unguium: Secondary | ICD-10-CM

## 2020-10-21 DIAGNOSIS — E785 Hyperlipidemia, unspecified: Secondary | ICD-10-CM

## 2020-10-21 DIAGNOSIS — E1169 Type 2 diabetes mellitus with other specified complication: Secondary | ICD-10-CM

## 2020-10-21 DIAGNOSIS — F325 Major depressive disorder, single episode, in full remission: Secondary | ICD-10-CM

## 2020-10-21 DIAGNOSIS — G4733 Obstructive sleep apnea (adult) (pediatric): Secondary | ICD-10-CM

## 2020-10-21 DIAGNOSIS — E1122 Type 2 diabetes mellitus with diabetic chronic kidney disease: Secondary | ICD-10-CM

## 2020-10-21 DIAGNOSIS — F17200 Nicotine dependence, unspecified, uncomplicated: Secondary | ICD-10-CM

## 2020-10-21 DIAGNOSIS — Z1211 Encounter for screening for malignant neoplasm of colon: Secondary | ICD-10-CM

## 2020-10-21 DIAGNOSIS — Z0001 Encounter for general adult medical examination with abnormal findings: Secondary | ICD-10-CM

## 2020-10-21 DIAGNOSIS — I700 Atherosclerosis of aorta: Secondary | ICD-10-CM

## 2020-10-21 DIAGNOSIS — J449 Chronic obstructive pulmonary disease, unspecified: Secondary | ICD-10-CM

## 2020-10-21 DIAGNOSIS — Z8249 Family history of ischemic heart disease and other diseases of the circulatory system: Secondary | ICD-10-CM

## 2020-10-21 DIAGNOSIS — Z79899 Other long term (current) drug therapy: Secondary | ICD-10-CM

## 2020-10-21 DIAGNOSIS — E559 Vitamin D deficiency, unspecified: Secondary | ICD-10-CM

## 2020-10-21 MED ORDER — TERBINAFINE HCL 250 MG PO TABS: ORAL_TABLET | ORAL | 1 refills | Status: DC

## 2020-10-22 LAB — CBC WITH DIFFERENTIAL/PLATELET
Absolute Monocytes: 502 cells/uL (ref 200–950)
Basophils Absolute: 51 cells/uL (ref 0–200)
Basophils Relative: 0.9 %
Eosinophils Absolute: 342 cells/uL (ref 15–500)
Eosinophils Relative: 6 %
HCT: 44.1 % (ref 35.0–45.0)
Hemoglobin: 14.1 g/dL (ref 11.7–15.5)
Lymphs Abs: 1864 cells/uL (ref 850–3900)
MCH: 29.9 pg (ref 27.0–33.0)
MCHC: 32 g/dL (ref 32.0–36.0)
MCV: 93.6 fL (ref 80.0–100.0)
MPV: 12 fL (ref 7.5–12.5)
Monocytes Relative: 8.8 %
Neutro Abs: 2941 cells/uL (ref 1500–7800)
Neutrophils Relative %: 51.6 %
Platelets: 163 10*3/uL (ref 140–400)
RBC: 4.71 10*6/uL (ref 3.80–5.10)
RDW: 13.1 % (ref 11.0–15.0)
Total Lymphocyte: 32.7 %
WBC: 5.7 10*3/uL (ref 3.8–10.8)

## 2020-10-22 LAB — COMPLETE METABOLIC PANEL WITH GFR
AG Ratio: 1.4 (calc) (ref 1.0–2.5)
ALT: 34 U/L — ABNORMAL HIGH (ref 6–29)
AST: 37 U/L — ABNORMAL HIGH (ref 10–35)
Albumin: 3.7 g/dL (ref 3.6–5.1)
Alkaline phosphatase (APISO): 178 U/L — ABNORMAL HIGH (ref 37–153)
BUN: 23 mg/dL (ref 7–25)
CO2: 30 mmol/L (ref 20–32)
Calcium: 9 mg/dL (ref 8.6–10.4)
Chloride: 105 mmol/L (ref 98–110)
Creat: 0.75 mg/dL (ref 0.50–1.05)
Globulin: 2.7 g/dL (calc) (ref 1.9–3.7)
Glucose, Bld: 80 mg/dL (ref 65–99)
Potassium: 4 mmol/L (ref 3.5–5.3)
Sodium: 141 mmol/L (ref 135–146)
Total Bilirubin: 0.4 mg/dL (ref 0.2–1.2)
Total Protein: 6.4 g/dL (ref 6.1–8.1)
eGFR: 87 mL/min/{1.73_m2} (ref >=60)

## 2020-10-22 LAB — URINALYSIS, ROUTINE W REFLEX MICROSCOPIC
Bacteria, UA: NONE SEEN /HPF
Bilirubin Urine: NEGATIVE
Glucose, UA: NEGATIVE
Hgb urine dipstick: NEGATIVE
Ketones, ur: NEGATIVE
Nitrite: NEGATIVE
Protein, ur: NEGATIVE
Specific Gravity, Urine: 1.021 (ref 1.001–1.035)
pH: <=5 (ref 5.0–8.0)

## 2020-10-22 LAB — MAGNESIUM: Magnesium: 2.1 mg/dL (ref 1.5–2.5)

## 2020-10-22 LAB — LIPID PANEL
Cholesterol: 128 mg/dL (ref <200)
HDL: 48 mg/dL — ABNORMAL LOW (ref >=50)
LDL Cholesterol (Calc): 67 mg/dL (calc)
Non-HDL Cholesterol (Calc): 80 mg/dL (calc) (ref <130)
Total CHOL/HDL Ratio: 2.7 (calc) (ref <5.0)
Triglycerides: 52 mg/dL (ref <150)

## 2020-10-22 LAB — HEMOGLOBIN A1C
Hgb A1c MFr Bld: 5.4 % of total Hgb (ref <5.7)
Mean Plasma Glucose: 108 mg/dL
eAG (mmol/L): 6 mmol/L

## 2020-10-22 LAB — TSH: TSH: 4.79 mIU/L — ABNORMAL HIGH (ref 0.40–4.50)

## 2020-10-22 LAB — VITAMIN D 25 HYDROXY (VIT D DEFICIENCY, FRACTURES): Vit D, 25-Hydroxy: 68 ng/mL (ref 30–100)

## 2020-10-22 LAB — MICROALBUMIN / CREATININE URINE RATIO
Creatinine, Urine: 81 mg/dL (ref 20–275)
Microalb, Ur: <0.2 mg/dL

## 2020-10-22 LAB — INSULIN, RANDOM: Insulin: 7.8 u[IU]/mL

## 2020-10-22 NOTE — Progress Notes (Signed)
============================================================ -   Test results slightly outside the reference range are not unusual. If there is anything important, I will review this with you,  otherwise it is considered normal test values.  If you have further questions,  please do not hesitate to contact me at the office or via My Chart.  ============================================================ ============================================================  -  Liver enzymes still elevated about the same - continue with weight loss. ============================================================ ============================================================  -  Total Chol = 128  & LDL Chol = 67 - Both  Excellent   - Very low risk for Heart Attack  / Stroke ============================================================ ============================================================  -  TSH  ( Thyroid ) - slightly elevated,  but not enough to recommend                                                                        starting thyroid meds at this point   - but please be sure not taking any supplements with large amounts of biotin,                                As biotin will interfere with the measurement of your thyroid.  ============================================================ ============================================================  -  A1c remains Normal - Non Diabetic range - Great  ! ============================================================ ============================================================  -  Vitamin D = 68 - Excellent ! ============================================================ ============================================================  -  All Else - CBC - Kidneys - Electrolytes - Liver & Magnesium   - all  Normal / OK ============================================================  ============================================================

## 2020-11-07 ENCOUNTER — Encounter: Payer: Medicare Other | Admitting: Internal Medicine

## 2020-11-18 ENCOUNTER — Encounter: Payer: Medicare Other | Admitting: Internal Medicine

## 2020-11-18 ENCOUNTER — Other Ambulatory Visit: Payer: Self-pay

## 2020-11-18 DIAGNOSIS — Z1212 Encounter for screening for malignant neoplasm of rectum: Secondary | ICD-10-CM

## 2020-11-18 DIAGNOSIS — Z1211 Encounter for screening for malignant neoplasm of colon: Secondary | ICD-10-CM

## 2020-11-18 LAB — POC HEMOCCULT BLD/STL (HOME/3-CARD/SCREEN)
Card #2 Fecal Occult Blod, POC: NEGATIVE
Card #3 Fecal Occult Blood, POC: NEGATIVE
Fecal Occult Blood, POC: NEGATIVE

## 2020-11-19 DIAGNOSIS — Z1212 Encounter for screening for malignant neoplasm of rectum: Secondary | ICD-10-CM | POA: Diagnosis not present

## 2020-11-19 DIAGNOSIS — Z1211 Encounter for screening for malignant neoplasm of colon: Secondary | ICD-10-CM | POA: Diagnosis not present

## 2020-12-05 ENCOUNTER — Other Ambulatory Visit: Payer: Self-pay

## 2020-12-05 ENCOUNTER — Encounter (HOSPITAL_BASED_OUTPATIENT_CLINIC_OR_DEPARTMENT_OTHER): Payer: Self-pay | Admitting: Obstetrics & Gynecology

## 2020-12-05 ENCOUNTER — Ambulatory Visit (INDEPENDENT_AMBULATORY_CARE_PROVIDER_SITE_OTHER): Payer: Medicare Other | Admitting: Obstetrics & Gynecology

## 2020-12-05 VITALS — BP 122/79 | HR 79 | Ht 64.0 in | Wt 189.0 lb

## 2020-12-05 DIAGNOSIS — N3281 Overactive bladder: Secondary | ICD-10-CM

## 2020-12-05 DIAGNOSIS — Z01419 Encounter for gynecological examination (general) (routine) without abnormal findings: Secondary | ICD-10-CM | POA: Diagnosis not present

## 2020-12-05 DIAGNOSIS — Z780 Asymptomatic menopausal state: Secondary | ICD-10-CM | POA: Diagnosis not present

## 2020-12-05 DIAGNOSIS — R634 Abnormal weight loss: Secondary | ICD-10-CM

## 2020-12-05 DIAGNOSIS — D251 Intramural leiomyoma of uterus: Secondary | ICD-10-CM | POA: Diagnosis not present

## 2020-12-05 MED ORDER — CLOBETASOL PROPIONATE 0.05 % EX OINT
TOPICAL_OINTMENT | CUTANEOUS | 0 refills | Status: DC
Start: 2020-12-05 — End: 2021-01-02

## 2020-12-05 NOTE — Progress Notes (Signed)
70 y.o. [id number] Divorced White or Caucasian female here for breast and pelvic exam.  She is having some issues with urinary incontinence.  She has been seeing Dr. Matilde Sprang.  She reports she does not have much prolapse.  He thinks this is bladder spasm.  She is receiving botox injections.  This lasts about 4 months for her.  She is going to call him to have injection done again.  She does want to know what I think about any possible prolapse and if she should consider any other treatment.    Denies vaginal bleeding.  Has BMD in 2021.  There was mild osteopenia.  She's lost 90 pounds.  Weight is now stable. We discussed the changes seen with BMDs with weight loss.  I feel she should wait until 2023-2024 for repeat testing.    Using walking now due to chronic knee issues and failed knee replacement surgeries.  Patient's last menstrual period was 04/09/2009.          Sexually active: No.  H/O STD:  no  Health Maintenance: PCP:  Dr. Melford Aase.  Last wellness appt was 10/2020.  Did blood work at that appt:  yes Vaccines are up to date:  has not done the 4th Covid vaccine nor flu shot Colonoscopy:  10/12/2012, follow up 5 years.  Doing Cologuard with Dr. Melford Aase.  Pt reports this is normal.    MMG:  10/02/2020 BMD:  07/17/2019, osteopenia Last pap smear:  06/26/2019 Negative.   H/o abnormal pap smear:  no   reports that she has been smoking cigarettes. She started smoking about 49 years ago. She has a 35.25 pack-year smoking history. She has never used smokeless tobacco. She reports that she does not drink alcohol and does not use drugs.  Past Medical History:  Diagnosis Date   Abscessed tooth    Anemia    Anxiety    Arthritis    Bell's palsy 24/5809   Complication of anesthesia    COPD (chronic obstructive pulmonary disease) ([place])    Depression    Diabetes mellitus without complication ([place])    Fibroid uterus 12/20/2014   7cm, 3.4cm and 1.9cm fibroids    GERD (gastroesophageal reflux  disease)    Hypertension    IBS (irritable bowel syndrome) 12/89   Obesity hypoventilation syndrome ([place]) 11/19/2014   Obesity, Class III, BMI 40-49.9 (morbid obesity) ([place])    OSA (obstructive sleep apnea)    no cpap   Vitamin D deficiency     Past Surgical History:  Procedure Laterality Date   BLADDER SUSPENSION  03/2006, 07/2006, 01/2007   for stress incontinence   CHOLECYSTECTOMY N/A 12/08/2018   Procedure: LAPAROSCOPIC CHOLECYSTECTOMY;  Surgeon: Coralie Keens, MD;  Location: [facility];  Service: General;  Laterality: N/A;   COLONOSCOPY  05/2005   Normal, recheck 10 yrs   LUMBAR FUSION Right 09/2016   L4-5 fusion by Dr. Sherley Bounds   TONSILLECTOMY AND ADENOIDECTOMY     TOTAL KNEE ARTHROPLASTY Left 2012   x 2 left knee   TOTAL KNEE REVISION  2014   TOTAL KNEE REVISION Left 08/2018   Dr Tracie Harrier, [facility]    Current Outpatient Medications  Medication Sig Dispense Refill   albuterol (VENTOLIN HFA) 108 (90 Base) MCG/ACT inhaler Use 2 inhalations 15 minutes apart every 4 hours as needed to rescue Asthma Attack 48 g 3   ALPRAZolam (XANAX) 0.25 MG tablet Take 1/2 to 1 tablet 2 x / day if needed for anxiety. (Patient taking  differently: Take 0.25 mg by mouth 2 (two) times daily.) 60 tablet 0   b complex vitamins capsule Take 1 capsule by mouth daily.     Cholecalciferol (VITAMIN D) 50 MCG (2000 UT) CAPS Take 1 capsule by mouth daily.     docusate sodium (COLACE) 100 MG capsule Take 100 mg by mouth 2 (two) times daily.     glucose blood (ACCU-CHEK AVIVA PLUS) test strip      LINZESS 145 MCG CAPS capsule Take 145 mcg by mouth every morning.     Magnesium 500 MG TABS Take 1 tablet by mouth daily.     metoCLOPramide (REGLAN) 10 MG tablet Take  1 tablet  4 x /day  before Meals & Bedtime for Acid Reflux / Patient knows to take by mouth 360 tablet 3   milk thistle 175 MG tablet Take 175 mg by mouth daily.     Omega-3 Fatty Acids (FISH OIL OMEGA-3 PO) Take 1 capsule by mouth daily.      rosuvastatin (CRESTOR) 10 MG tablet TAKE 1 TABLET BY MOUTH DAILY FOR CHOLESTEROL 90 tablet 3   terbinafine (LAMISIL) 250 MG tablet Take 1 tablet Daily for Nail Fungus 90 tablet 1   venlafaxine XR (EFFEXOR-XR) 150 MG 24 hr capsule Take 150 mg by mouth 2 (two) times daily.      vitamin E 180 MG (400 UNITS) capsule Take 400 Units by mouth daily.     zaleplon (SONATA) 10 MG capsule Take 10 mg by mouth at bedtime as needed for sleep.      zinc gluconate 50 MG tablet Take 50 mg by mouth daily.     famotidine (PEPCID) 10 MG tablet Take 10 mg by mouth at bedtime. (Patient not taking: Reported on 10/21/2020)     ONETOUCH DELICA LANCETS 42A MISC Check blood sugar 1 time daily-DX-E11.22 (Patient not taking: Reported on 12/05/2020) 100 each 4   ONETOUCH VERIO test strip USE TO CHECK BLOOD SUGAR ONCE DAILY (Patient not taking: Reported on 12/05/2020) 100 each 3   No current facility-administered medications for this visit.    Family History  Problem Relation Age of Onset   Hypertension Father    Diabetes Father    Parkinson's disease Father    Dementia Mother    Heart attack Paternal Grandfather 45       Died MI    Review of Systems  Constitutional: Negative.   Gastrointestinal: Negative.   Genitourinary: Negative.    Exam:   BP 122/79 (BP Location: Right Arm, Patient Position: Sitting, Cuff Size: Large)   Pulse 79   Ht 5\' 4"  (1.626 m)   Wt 189 lb (85.7 kg)   LMP 04/09/2009   BMI 32.44 kg/m   Height: 5\' 4"  (162.6 cm)  General appearance: alert, cooperative and appears stated age Breasts: normal appearance, no masses or tenderness Abdomen: soft, non-tender; bowel sounds normal; no masses,  no organomegaly Lymph nodes: Cervical, supraclavicular, and axillary nodes normal.  No abnormal inguinal nodes palpated Neurologic: Grossly normal  Pelvic: External genitalia:  no lesions              Urethra:  normal appearing urethra with no masses, tenderness or lesions              Bartholins and  Skenes: normal                 Vagina: normal appearing vagina with atrophic changes and no discharge, no lesions, mild cystocele  Cervix: no lesions              Pap taken: No. Bimanual Exam:  Uterus:  about 8 weeks size, mobile              Adnexa: normal adnexa and no mass, fullness, tenderness               Rectovaginal: Confirms               Anus:  normal sphincter tone, no lesions  Chaperone, Octaviano Batty, CMA, was present for exam.  Assessment/Plan: 1. Encntr for gyn exam (general) (routine) w/o abn findings - pap neg 2021 - MMG 09/2020 - colonoscopy overdue.  Pt is aware.  Reports she is doing cologuard with Dr. Franne Forts.  This is not in Epic and she is aware I cannot see this. - Lab work reviewed - care gaps reviewed/updated  2. OAB (overactive bladder) - discussed with pt urogynecology referral.  Possible medications and acupuncture discussed.    3. Postmenopausal - no HRT  4. Intramural leiomyoma of uterus - uterus smaller in size this year  5. Weight loss - this is stable over last year  6.  Osteopenia

## 2021-01-02 ENCOUNTER — Encounter (HOSPITAL_BASED_OUTPATIENT_CLINIC_OR_DEPARTMENT_OTHER): Payer: Self-pay | Admitting: Obstetrics & Gynecology

## 2021-01-02 ENCOUNTER — Ambulatory Visit (INDEPENDENT_AMBULATORY_CARE_PROVIDER_SITE_OTHER): Payer: Medicare Other | Admitting: Obstetrics & Gynecology

## 2021-01-02 ENCOUNTER — Other Ambulatory Visit: Payer: Self-pay

## 2021-01-02 VITALS — BP 123/80 | HR 70 | Ht 65.0 in | Wt 189.2 lb

## 2021-01-02 DIAGNOSIS — L900 Lichen sclerosus et atrophicus: Secondary | ICD-10-CM | POA: Diagnosis not present

## 2021-01-02 DIAGNOSIS — B372 Candidiasis of skin and nail: Secondary | ICD-10-CM | POA: Diagnosis not present

## 2021-01-02 MED ORDER — CLOBETASOL PROPIONATE 0.05 % EX OINT
TOPICAL_OINTMENT | CUTANEOUS | 0 refills | Status: DC
Start: 2021-01-02 — End: 2021-06-24

## 2021-01-02 MED ORDER — MOMETASONE FUROATE 0.1 % EX OINT: TOPICAL_OINTMENT | Freq: Every day | CUTANEOUS | 0 refills | Status: DC

## 2021-01-02 MED ORDER — NYSTATIN 100000 UNIT/GM EX CREA: 1.0000 "application " | TOPICAL_CREAM | Freq: Two times a day (BID) | CUTANEOUS | 1 refills | Status: DC

## 2021-01-02 NOTE — Patient Instructions (Addendum)
Please continue the clobetasol ointment nightly for another 6-8 weeks in the specific locations we discussed.    After that time, switch to mometasone ointment and use just twice weekly.  You can use this continuously.    For the red rash on the abdomen, Lotrimin or Lamisil are good over the counter products    Lichen Sclerosus Lichen sclerosus is a skin problem. It can happen on any part of the body, but it commonly involves the anal and genital areas. It can cause itching and discomfort in these areas. Treatment can help to control symptoms. When the genital area is affected, getting treatment is important because the condition can cause scarring that may lead to other problems if left untreated. What are the causes? The cause of this condition is not known. It may be related to an overactive immune system or a lack of certain hormones. Lichen sclerosus is not an infection or a fungus, and it is not passed from one person to another (non-contagious). What increases the risk? The following factors may make you more likely to develop this condition: You are a woman who has reached menopause. You are a man who was not circumcised. This condition may also develop for the first time in children, usually before they enter puberty. What are the signs or symptoms? Symptoms of this condition include: White areas (plaques) on the skin that may be thin and wrinkled, or thickened. Red and swollen patches (lesions) on the skin. Tears or cracks in the skin. Bruising. Blood blisters. Severe itching. Pain, itching, or burning when urinating. Constipation is also common in children with lichen sclerosus, but can be seen in adults. How is this diagnosed? This condition may be diagnosed with a physical exam. In some cases, a tissue sample may be removed to be checked under a microscope (biopsy). How is this treated? This condition may be treated with: Topical steroids. These are medicated creams or  ointments that are applied over the affected areas. Medicines that are taken by mouth. Topical immunotherapy. These are medicated creams or ointments that are applied over the affected areas. They stimulate your immune system to fight the skin condition. This may be used if steroids are not effective. Surgery. This may be needed in more severe cases that are causing problems such as scarring. Follow these instructions at home: Medicines Take over-the-counter and prescription medicines only as told by your health care provider. Use creams or ointments as told by your health care provider. Skin care Do not scratch the affected areas of skin. If you are a woman, be sure to keep the vaginal area as clean and dry as possible. Clean the affected area of skin gently with water only. Pat skin dry and avoid the use of rough towels or toilet paper. Avoid irritating skin products, including soap and scented lotions. Use emollient creams as directed by your health care provider to help reduce itching. General instructions Keep all follow-up visits. This is important. Your condition may cause constipation. To prevent or treat constipation, you may need to: Drink enough fluid to keep your urine pale yellow. Take over-the-counter or prescription medicines. Eat foods that are high in fiber, such as beans, whole grains, and fresh fruits and vegetables. Limit foods that are high in fat and processed sugars, such as fried or sweet foods. Contact a health care provider if: You have increasing redness, swelling, or pain in the affected area. You have fluid, blood, or pus coming from the affected area. You have  new lesions on your skin. You have a fever. You have pain during sex. Get help right away if: You develop severe pain or burning in the affected areas, especially in the genital area. Summary Lichen sclerosus is a skin problem. When the genital area is affected, getting treatment is important because  the condition can cause scarring that may lead to other problems if left untreated. This condition is usually treated with medicated creams or ointments (topical steroids) that are applied over the affected areas. Take or use over-the-counter and prescription medicines only as told by your health care provider. Contact a health care provider if you have new lesions on your skin, have pain during sex, or have increasing redness, swelling, or pain in the affected area. Keep all follow-up visits. This is important. This information is not intended to replace advice given to you by your health care provider. Make sure you discuss any questions you have with your health care provider. Document Revised: 07/08/2019 Document Reviewed: 07/08/2019 Elsevier Patient Education  [place].

## 2021-01-05 DIAGNOSIS — L900 Lichen sclerosus et atrophicus: Secondary | ICD-10-CM | POA: Insufficient documentation

## 2021-01-05 NOTE — Progress Notes (Signed)
GYNECOLOGY  VISIT  CC:  lichen sclerosus f/u  HPI: 70 y.o. G30P1021 Divorced White or Caucasian female here for f/u of probable lichen sclerosus noted at last visit.  Using clobetasol.  Reports this is hard to keep on skin due to urinary and bowel issues.  Has been trying to use twice daily.  Denies discharge, bleeding or significant itching.  Has some questions about lichen sclerosus causes.  These were answered.  Treatment with higher potency steroids and maintenance with lower potency steroids discussed.  Reasons for biopsying discussed.   Patient Active Problem List   Diagnosis Date Noted   Hepatic steatosis 02/07/2020   OAB (overactive bladder) 02/07/2020   CKD stage 2 due to type 2 diabetes mellitus ([place]) 02/06/2020   Aortic atherosclerosis by CTscan Jan 2021 04/01/2019   Overweight (BMI 25.0-29.9) 01/25/2019   S/P laparoscopic cholecystectomy 12/08/2018   Hyperlipidemia associated with type 2 diabetes mellitus ([place]) 06/27/2018   FHx: heart disease 02/14/2018   Osteopenia 11/09/2017   Seasonal allergies 12/15/2016   COPD (chronic obstructive pulmonary disease) ([place]) 12/20/2014   OSA and COPD overlap syndrome ([place]) 11/19/2014   GERD  11/19/2014   Medication management 09/27/2013   T2_NIDDM w/CKD 2  ([place])    Vitamin D deficiency    Iron deficiency anemia 06/17/2012   Smoker 06/16/2012   Essential hypertension 06/02/2012   Depression, major, 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   IBS (irritable bowel syndrome) 02/07/1988   Bell's palsy 01/07/1985    Past Medical History:  Diagnosis Date   Abscessed tooth    Anemia    Anxiety    Arthritis    Bell's palsy 49/6759   Complication of anesthesia    COPD (chronic obstructive pulmonary disease) ([place])    Depression    Diabetes mellitus without complication ([place])    Fibroid uterus 12/20/2014   7cm, 3.4cm and 1.9cm fibroids    GERD (gastroesophageal reflux disease)    Hypertension    IBS (irritable bowel syndrome) 12/89   Obesity  hypoventilation syndrome ([place]) 11/19/2014   Obesity, Class III, BMI 40-49.9 (morbid obesity) ([place])    OSA (obstructive sleep apnea)    no cpap   Vitamin D deficiency     Past Surgical History:  Procedure Laterality Date   BLADDER SUSPENSION  03/2006, 07/2006, 01/2007   for stress incontinence   CHOLECYSTECTOMY N/A 12/08/2018   Procedure: LAPAROSCOPIC CHOLECYSTECTOMY;  Surgeon: Coralie Keens, MD;  Location: [facility];  Service: General;  Laterality: N/A;   COLONOSCOPY  05/2005   Normal, recheck 10 yrs   LUMBAR FUSION Right 09/2016   L4-5 fusion by Dr. Sherley Bounds   TONSILLECTOMY AND ADENOIDECTOMY     TOTAL KNEE ARTHROPLASTY Left 2012   x 2 left knee   TOTAL KNEE REVISION  2014   TOTAL KNEE REVISION Left 08/2018   Dr Tracie Harrier, [facility]    MEDS:   Current Outpatient Medications on File Prior to Visit  Medication Sig Dispense Refill   albuterol (VENTOLIN HFA) 108 (90 Base) MCG/ACT inhaler Use 2 inhalations 15 minutes apart every 4 hours as needed to rescue Asthma Attack 48 g 3   ALPRAZolam (XANAX) 0.25 MG tablet Take 1/2 to 1 tablet 2 x / day if needed for anxiety. (Patient taking differently: Take 0.25 mg by mouth 2 (two) times daily.) 60 tablet 0   b complex vitamins capsule Take 1 capsule by mouth daily.     Cholecalciferol (VITAMIN D) 50 MCG (2000 UT) CAPS Take 1 capsule by  mouth daily.     docusate sodium (COLACE) 100 MG capsule Take 100 mg by mouth 2 (two) times daily.     famotidine (PEPCID) 10 MG tablet Take 10 mg by mouth at bedtime.     glucose blood (ACCU-CHEK AVIVA PLUS) test strip      LINZESS 145 MCG CAPS capsule Take 145 mcg by mouth every morning.     Magnesium 500 MG TABS Take 1 tablet by mouth daily.     metoCLOPramide (REGLAN) 10 MG tablet Take  1 tablet  4 x /day  before Meals & Bedtime for Acid Reflux / Patient knows to take by mouth 360 tablet 3   milk thistle 175 MG tablet Take 175 mg by mouth daily.     Omega-3 Fatty Acids (FISH OIL OMEGA-3 PO) Take 1  capsule by mouth daily.     ONETOUCH DELICA LANCETS 42H MISC Check blood sugar 1 time daily-DX-E11.22 100 each 4   ONETOUCH VERIO test strip USE TO CHECK BLOOD SUGAR ONCE DAILY 100 each 3   rosuvastatin (CRESTOR) 10 MG tablet TAKE 1 TABLET BY MOUTH DAILY FOR CHOLESTEROL 90 tablet 3   terbinafine (LAMISIL) 250 MG tablet Take 1 tablet Daily for Nail Fungus 90 tablet 1   venlafaxine XR (EFFEXOR-XR) 150 MG 24 hr capsule Take 150 mg by mouth 2 (two) times daily.      vitamin E 180 MG (400 UNITS) capsule Take 400 Units by mouth daily.     zaleplon (SONATA) 10 MG capsule Take 10 mg by mouth at bedtime as needed for sleep.      zinc gluconate 50 MG tablet Take 50 mg by mouth daily.     No current facility-administered medications on file prior to visit.    ALLERGIES: Penicillins, Sulfa antibiotics, Celebrex [celecoxib], Ciprofloxacin, Codeine, Levaquin [levofloxacin], Motrin ib [ibuprofen], and Prempro [conj estrog-medroxyprogest ace]  Family History  Problem Relation Age of Onset   Hypertension Father    Diabetes Father    Parkinson's disease Father    Dementia Mother    Heart attack Paternal Grandfather 58       Died MI    SH:  divorced, smoker  Review of Systems  Constitutional: Negative.   Genitourinary:        Incontinence   PHYSICAL EXAMINATION:    BP 123/80 (BP Location: Right Arm, Patient Position: Sitting, Cuff Size: Large)   Pulse 70   Ht 5\' 5"  (1.651 m) Comment: reported  Wt 189 lb 3.2 oz (85.8 kg)   LMP 04/09/2009   BMI 31.48 kg/m     Physical Exam Constitutional:      Appearance: Normal appearance.  Genitourinary:    Labia:        Right: Lesion present. No rash.        Left: Lesion present. No rash.      Urethra: No urethral lesion.    Lymphadenopathy:     Lower Body: No right inguinal adenopathy. No left inguinal adenopathy.  Skin:    General: Skin is warm.     Comments: Erythematous slightly raised rash underneath skin fold on lower abdomen with satellite  lesions.  Neurological:     General: No focal deficit present.     Mental Status: She is alert.     Assessment/Plan: 1. Lichen sclerosus - will continue to treat BID with clobetasol for another 6-8 weeks an and then transition to lower potency steroid for maintenance with twice weekly dosage - clobetasol ointment (TEMOVATE) 0.05 %;  Apply bid daily for 5-7 days with new symptoms.  Dispense: 60 g; Refill: 0 - mometasone (ELOCON) 0.1 % ointment; Apply topically daily.  Dispense: 45 g; Refill: 0 - reasons for being sooner discussed.  Will recheck 1 year  2. Candidal skin infection - nystatin cream (MYCOSTATIN); Apply 1 application topically 2 (two) times daily. Apply to affected area BID for up to 7 days.  Dispense: 30 g; Refill: 1 There are no diagnoses linked to this encounter.

## 2021-01-27 NOTE — Progress Notes (Signed)
MEDICARE ANNUAL WELLNESS VISIT AND FOLLOW UP   Assessment:    Encounter for Medicare annual wellness exam 1 year  Controlled type 2 diabetes mellitus with chronic kidney disease, without long-term current use of insulin, unspecified CKD stage ([place]) -     Hemoglobin A1c Discussed general issues about diabetes pathophysiology and management., Educational material distributed., Suggested low cholesterol diet., Encouraged aerobic exercise., Discussed foot care., Reminded to get yearly retinal exam.  Aortic atherosclerosis ([place]) Control blood pressure, cholesterol, glucose, increase exercise.   OSA and COPD overlap syndrome (HCC) Sleep apnea- continue CPAP, CPAP is helping with daytime fatigue, weight loss still advised.   Obesity hypoventilation syndrome (HCC) Sleep apnea- continue CPAP, CPAP is helping with daytime fatigue, weight loss still advised.   Chronic obstructive pulmonary disease, unspecified COPD type ([place]) No triggers, well controlled symptoms, cont to monitor  Hyperlipidemia associated with type 2 diabetes mellitus ([place]) -     Lipid panel -     Hemoglobin A1c - TSH - CMP check lipids decrease fatty foods increase activity.   Morbid obesity ([place]) - BMI 30+ with OSA, T2DM, htn, hyperlipidemia - follow up 3 months for progress monitoring - increase veggies, decrease carbs - long discussion about weight loss, diet, and exercise  Depression, major, in remission ([place]) - continue medications, stress management techniques discussed, increase water, good sleep hygiene discussed, increase exercise, and increase veggies.   Essential hypertension -     CBC with Differential/Platelet -     COMPLETE METABOLIC PANEL WITH GFR -     TSH - continue medications, DASH diet, exercise and monitor at home. Call if greater than 130/80.   Vitamin D deficiency Continue Vit D supplementation  Medication management -     Continued  Irritable bowel syndrome, unspecified type If  not on benefiber then add it, decrease stress,  if any worsening symptoms, blood in stool, AB pain, etc call office Follow up Dr. Collene Mares  Gastroesophageal reflux disease, unspecified whether esophagitis present Continue PPI/H2 blocker, diet discussed  Osteopenia of multiple sites Monitor  Seasonal allergies Allergy Continue OTC allergy pills   Over 40 minutes of exam, counseling, chart review and critical decision making was performed Future Appointments  Date Time Provider [place]  05/01/2021 11:30 AM [name], MD [facility] None  10/21/2021 10:00 AM [name], MD [facility] None  01/05/2022 10:15 AM Megan Salon, MD [facility]  02/02/2022 11:00 AM Magda Bernheim, NP [facility] None     Plan:   During the course of the visit the patient was educated and counseled about appropriate screening and preventive services including:   Pneumococcal vaccine  Prevnar 13 Influenza vaccine Td vaccine Screening electrocardiogram Bone densitometry screening Colorectal cancer screening Diabetes screening Glaucoma screening Nutrition counseling  Advanced directives: requested   Subjective:  Beth [name] is a 70 y.o. female who presents for Medicare Annual Wellness Visit and 3 month follow up.   Patient was seeing Dr Elta Guadeloupe Phillips/Pain Mgmt over 20+ years for Chronic Pain Syndrome consequent of Lumbar DDD, stopped after last surgery in Jul2018.  She is using a walker due left knee pain, she had total knee revision in June 2020 with Dr. Tracie Harrier but states it was not successful, she has to wear a brace and use her walker still.   She was seen by GI last year, she was unable to do prep due to abnormality of gait. Is willing to do Cologuard.   She currently continues to smoke, now down to  less than a 1/2 pack a day; discussed risks associated with smoking, patient is ready to quit. Has declined medications thus far.  She has a 47 pack year smoking history.  Low  dose CT lung cancer 04/26/20- repeat- had aortic atherosclerosis and COPD.   She has hx of major depression in remission on effexor; she is also prescribed xanax 0.25 mg BID PRN breakthrough anxiety. Has not had to take Sonata in months, sleeping well.   BMI is Body mass index is 31.02 kg/m., she is still in PT for her knee.  Wt Readings from Last 3 Encounters:  01/29/21 186 lb 6.4 oz (84.6 kg)  01/02/21 189 lb 3.2 oz (85.8 kg)  12/05/20 189 lb (85.7 kg)    Her blood pressure has been controlled at home, today their BP is BP: 130/82 BP Readings from Last 3 Encounters:  01/29/21 130/82  01/02/21 123/80  12/05/20 122/79    She does not workout. She denies chest pain, shortness of breath, dizziness.   She is on cholesterol medication (crestor 10mg , not at goal of 70) and denies myalgias. Her cholesterol is at goal. The cholesterol last visit was:   Lab Results  Component Value Date   CHOL 128 10/21/2020   HDL 48 (L) 10/21/2020   LDLCALC 67 10/21/2020   TRIG 52 10/21/2020   CHOLHDL 2.7 10/21/2020    She has not been working on diet and exercise for T2DM currently treated by lifestyle modification,, and denies foot ulcerations, increased appetite, nausea, polydipsia, polyuria and vomiting.  Last A1C in the office was:  Lab Results  Component Value Date   HGBA1C 5.4 10/21/2020   Last GFR: Lab Results  Component Value Date   GFRNONAA 79 05/14/2020   Patient is on Vitamin D supplement.   Lab Results  Component Value Date   VD25OH 68 10/21/2020      Medication Review:   Current Outpatient Medications (Cardiovascular):    rosuvastatin (CRESTOR) 10 MG tablet, TAKE 1 TABLET BY MOUTH DAILY FOR CHOLESTEROL  Current Outpatient Medications (Respiratory):    albuterol (VENTOLIN HFA) 108 (90 Base) MCG/ACT inhaler, Use 2 inhalations 15 minutes apart every 4 hours as needed to rescue Asthma Attack    Current Outpatient Medications (Other):    ALPRAZolam (XANAX) 0.25 MG tablet,  Take 1/2 to 1 tablet 2 x / day if needed for anxiety. (Patient taking differently: Take 0.25 mg by mouth 2 (two) times daily.)   b complex vitamins capsule, Take 1 capsule by mouth daily.   Cholecalciferol (VITAMIN D) 50 MCG (2000 UT) CAPS, Take 1 capsule by mouth daily.   clobetasol ointment (TEMOVATE) 0.05 %, Apply bid daily for 5-7 days with new symptoms.   docusate sodium (COLACE) 100 MG capsule, Take 100 mg by mouth 2 (two) times daily.   famotidine (PEPCID) 10 MG tablet, Take 10 mg by mouth at bedtime.   LINZESS 145 MCG CAPS capsule, Take 145 mcg by mouth every morning.   Magnesium 500 MG TABS, Take 1 tablet by mouth daily.   metoCLOPramide (REGLAN) 10 MG tablet, Take  1 tablet  4 x /day  before Meals & Bedtime for Acid Reflux / Patient knows to take by mouth   milk thistle 175 MG tablet, Take 175 mg by mouth daily.   mometasone (ELOCON) 0.1 % ointment, Apply topically daily.   nystatin cream (MYCOSTATIN), Apply 1 application topically 2 (two) times daily. Apply to affected area BID for up to 7 days.   Omega-3 Fatty  Acids (FISH OIL OMEGA-3 PO), Take 1 capsule by mouth daily.   terbinafine (LAMISIL) 250 MG tablet, Take 1 tablet Daily for Nail Fungus   venlafaxine XR (EFFEXOR-XR) 150 MG 24 hr capsule, Take 150 mg by mouth 2 (two) times daily.    vitamin E 180 MG (400 UNITS) capsule, Take 400 Units by mouth daily.   zaleplon (SONATA) 10 MG capsule, Take 10 mg by mouth at bedtime as needed for sleep.    zinc gluconate 50 MG tablet, Take 50 mg by mouth daily.   glucose blood (ACCU-CHEK AVIVA PLUS) test strip,    ONETOUCH DELICA LANCETS 97D MISC, Check blood sugar 1 time daily-[id number]   ONETOUCH VERIO test strip, USE TO CHECK BLOOD SUGAR ONCE DAILY   Allergies  Allergen Reactions   Penicillins Anaphylaxis and Rash        Sulfa Antibiotics Shortness Of Breath    Also light headed, rash   Celebrex [Celecoxib] Other (See Comments)    Elevates Blood Pressure    Ciprofloxacin Nausea And  Vomiting   Codeine Nausea And Vomiting    And hydrocodone   Levaquin [Levofloxacin] Nausea And Vomiting   Motrin Ib [Ibuprofen] Hives   Prempro [Conj Estrog-Medroxyprogest Ace] Hives    Current Problems (verified) Patient Active Problem List   Diagnosis Date Noted   Lichen sclerosus 53/29/9242   Hepatic steatosis 02/07/2020   OAB (overactive bladder) 02/07/2020   CKD stage 2 due to type 2 diabetes mellitus ([place]) 02/06/2020   Aortic atherosclerosis by CTscan Jan 2021 04/01/2019   Overweight (BMI 25.0-29.9) 01/25/2019   S/P laparoscopic cholecystectomy 12/08/2018   Hyperlipidemia associated with type 2 diabetes mellitus ([place]) 06/27/2018   FHx: heart disease 02/14/2018   Osteopenia 11/09/2017   Seasonal allergies 12/15/2016   COPD (chronic obstructive pulmonary disease) ([place]) 12/20/2014   OSA and COPD overlap syndrome ([place]) 11/19/2014   GERD  11/19/2014   Medication management 09/27/2013   T2_NIDDM w/CKD 2  ([place])    Vitamin D deficiency    Iron deficiency anemia 06/17/2012   Smoker 06/16/2012   Essential hypertension 06/02/2012   Depression, major, in remission ([place]) 04/09/1988   IBS (irritable bowel syndrome) 02/07/1988   Bell's palsy 01/07/1985    Screening Tests Immunization History  Administered Date(s) Administered   DT (Pediatric) 05/21/2015   Influenza, High Dose Seasonal PF 01/26/2019, 12/08/2019   Influenza,inj,Quad PF,6+ Mos 12/02/2016   Influenza,inj,quad, With Preservative 12/09/2015   Influenza-Unspecified 12/07/2016, 12/11/2020   Moderna Sars-Covid-2 Vaccination 04/24/2019, 05/23/2019, 01/17/2020   PPD Test 03/21/2013, 05/21/2015   Pneumococcal Conjugate-13 01/04/2014   Pneumococcal Polysaccharide-23 03/09/2004, 11/11/2017   Td 03/09/2004   Tdap 03/10/2015   Typhoid Inactivated 05/10/2014   Zoster Recombinat (Shingrix) 05/21/2020, 07/21/2020   Zoster, Live 12/22/2012    Preventative care: Last colonoscopy: 10/12/2012 - declines, will do cologuard Last  mammogram: 10/02/20 benign repeat 1 year Last pap smear/pelvic exam: 12/2016 with GYN DEXA: 09/28/19 - osteopenia- repeat 2023 CT low dose lung screen 04/26/20 emphysema, aortic atherosclerosis repeat in 1 year  Prior vaccinations: TD or Tdap: 2017  Influenza: 20 Pneumococcal: 2019 Prevnar13: 2015 COVID vaccines done Shingles/Zostavax: 2014, 2/2 Shingrix 2022  Names of Other Physician/Practitioners you currently use: 1. [facility] here for primary care 2. Dr. Delman Cheadle, eye doctor, last visit 07/2020 3. Dr. Otila Kluver, dentist, last visit 07/2020  Patient Care Team: [name], MD as PCP - General (Internal Medicine) Juanita Craver, MD as Consulting Physician (Gastroenterology)  SURGICAL HISTORY She  has  a past surgical history that includes Colonoscopy (05/2005); Total knee arthroplasty (Left, 2012); Bladder suspension (03/2006, 07/2006, 01/2007); Total knee revision (2014); Tonsillectomy and adenoidectomy; Lumbar fusion (Right, 09/2016); Cholecystectomy (N/A, 12/08/2018); and Total knee revision (Left, 08/2018). FAMILY HISTORY Her family history includes Dementia in her mother; Diabetes in her father; Heart attack (age of onset: 23) in her paternal grandfather; Hypertension in her father; Parkinson's disease in her father. SOCIAL HISTORY She  reports that she has been smoking cigarettes. She started smoking about 49 years ago. She has a 35.25 pack-year smoking history. She has never used smokeless tobacco. She reports that she does not drink alcohol and does not use drugs.  MEDICARE WELLNESS OBJECTIVES: Physical activity: Current Exercise Habits: Structured exercise class, Type of exercise: calisthenics;strength training/weights, Time (Minutes): 30, Frequency (Times/Week): 3, Weekly Exercise (Minutes/Week): 90, Intensity: Mild, Exercise limited by: orthopedic condition(s) Cardiac risk factors: Cardiac Risk Factors include: advanced age (>67men, >35  women);dyslipidemia;diabetes mellitus;hypertension;obesity (BMI >30kg/m2);sedentary lifestyle;smoking/ tobacco exposure Depression/mood screen:   Depression screen [facility] 2/9 01/29/2021  Decreased Interest 0  Down, Depressed, Hopeless 0  PHQ - 2 Score 0  Altered sleeping -  Tired, decreased energy -  Change in appetite -  Feeling bad or failure about yourself  -  Trouble concentrating -  Moving slowly or fidgety/restless -  Suicidal thoughts -  PHQ-9 Score -  Some recent data might be hidden    ADLs:  In your present state of health, do you have any difficulty performing the following activities: 01/29/2021 10/20/2020  Hearing? N N  Vision? N N  Difficulty concentrating or making decisions? N N  Walking or climbing stairs? Y N  Comment uses rolling walker -  Dressing or bathing? N N  Doing errands, shopping? N N  Some recent data might be hidden     Cognitive Testing  Alert? Yes  Normal Appearance?Yes  Oriented to person? Yes  Place? Yes   Time? Yes  Recall of three objects?  Yes  Can perform simple calculations? Yes  Displays appropriate judgment?Yes  Can read the correct time from a watch face?Yes  EOL planning: Does Patient Have a Medical Advance Directive?: Yes Type of Advance Directive: Healthcare Power of Attorney, Living will Does patient want to make changes to medical advance directive?: No - Patient declined Copy of [place] in Chart?: No - copy requested  Review of Systems  Constitutional:  Negative for malaise/fatigue and weight loss.  HENT:  Negative for hearing loss and tinnitus.   Eyes:  Negative for blurred vision and double vision.  Respiratory:  Negative for cough, sputum production, shortness of breath and wheezing.   Cardiovascular:  Negative for chest pain, palpitations, orthopnea, claudication, leg swelling and PND.  Gastrointestinal:  Negative for abdominal pain, blood in stool, constipation, diarrhea, heartburn, melena, nausea  and vomiting.  Genitourinary:  Negative for dysuria, flank pain, frequency, hematuria and urgency.  Musculoskeletal:  Positive for falls (unsteady gait due to unequal leg lenghts, has done PT). Negative for joint pain and myalgias.  Skin:  Negative for rash.  Neurological:  Negative for dizziness, tingling, sensory change, weakness and headaches.  Endo/Heme/Allergies:  Negative for polydipsia.  Psychiatric/Behavioral: Negative.  Negative for depression, memory loss, substance abuse and suicidal ideas. The patient is not nervous/anxious and does not have insomnia.   All other systems reviewed and are negative.   Objective:     Today's Vitals   01/29/21 1127  BP: 130/82  Pulse: 79  Temp: Marland Kitchen)  97.3 F (36.3 C)  SpO2: 96%  Weight: 186 lb 6.4 oz (84.6 kg)   Body mass index is 31.02 kg/m. General Appearance: Well nourished, in no apparent distress. Eyes: PERRLA, EOMs, conjunctiva no swelling or erythema Sinuses: No Frontal/maxillary tenderness ENT/Mouth: Ext aud canals clear, TMs without erythema, bulging. No erythema, swelling, or exudate on post pharynx.  Tonsils not swollen or erythematous. Hearing normal.  Neck: Supple, thyroid normal.  Respiratory: Respiratory effort normal, BS equal bilaterally without rales, rhonchi, wheezing or stridor.  Cardio: RRR with no MRGs. Brisk peripheral pulses without edema.  Abdomen: Soft, + BS.  Non tender, no guarding, rebound, hernias, masses. Lymphatics: Non tender without lymphadenopathy.  Musculoskeletal: Full ROM, 5/5 strength, Normal gait with walker Skin: Warm, dry without rashes, lesions, ecchymosis.  Neuro: Cranial nerves intact. No cerebellar symptoms.  Psych: Awake and oriented X 3, normal affect, Insight and Judgment appropriate.   Medicare Attestation I have personally reviewed: The patient's medical and social history Their use of alcohol, tobacco or illicit drugs Their current medications and supplements The patient's functional  ability including ADLs,fall risks, home safety risks, cognitive, and hearing and visual impairment Diet and physical activities Evidence for depression or mood disorders  The patient's weight, height, BMI, and visual acuity have been recorded in the chart.  I have made referrals, counseling, and provided education to the patient based on review of the above and I have provided the patient with a written personalized care plan for preventive services.     Magda Bernheim, NP   01/29/2021

## 2021-01-29 ENCOUNTER — Other Ambulatory Visit: Payer: Self-pay

## 2021-01-29 ENCOUNTER — Ambulatory Visit (INDEPENDENT_AMBULATORY_CARE_PROVIDER_SITE_OTHER): Payer: Medicare Other | Admitting: Nurse Practitioner

## 2021-01-29 ENCOUNTER — Encounter: Payer: Self-pay | Admitting: Nurse Practitioner

## 2021-01-29 VITALS — BP 130/82 | HR 79 | Temp 97.3°F | Wt 186.4 lb

## 2021-01-29 DIAGNOSIS — E662 Morbid (severe) obesity with alveolar hypoventilation: Secondary | ICD-10-CM

## 2021-01-29 DIAGNOSIS — R6889 Other general symptoms and signs: Secondary | ICD-10-CM | POA: Diagnosis not present

## 2021-01-29 DIAGNOSIS — M8580 Other specified disorders of bone density and structure, unspecified site: Secondary | ICD-10-CM

## 2021-01-29 DIAGNOSIS — Z1211 Encounter for screening for malignant neoplasm of colon: Secondary | ICD-10-CM

## 2021-01-29 DIAGNOSIS — E1122 Type 2 diabetes mellitus with diabetic chronic kidney disease: Secondary | ICD-10-CM

## 2021-01-29 DIAGNOSIS — E785 Hyperlipidemia, unspecified: Secondary | ICD-10-CM

## 2021-01-29 DIAGNOSIS — K219 Gastro-esophageal reflux disease without esophagitis: Secondary | ICD-10-CM

## 2021-01-29 DIAGNOSIS — I10 Essential (primary) hypertension: Secondary | ICD-10-CM

## 2021-01-29 DIAGNOSIS — R748 Abnormal levels of other serum enzymes: Secondary | ICD-10-CM

## 2021-01-29 DIAGNOSIS — Z79899 Other long term (current) drug therapy: Secondary | ICD-10-CM

## 2021-01-29 DIAGNOSIS — E1169 Type 2 diabetes mellitus with other specified complication: Secondary | ICD-10-CM | POA: Diagnosis not present

## 2021-01-29 DIAGNOSIS — G4733 Obstructive sleep apnea (adult) (pediatric): Secondary | ICD-10-CM | POA: Diagnosis not present

## 2021-01-29 DIAGNOSIS — J302 Other seasonal allergic rhinitis: Secondary | ICD-10-CM

## 2021-01-29 DIAGNOSIS — E559 Vitamin D deficiency, unspecified: Secondary | ICD-10-CM

## 2021-01-29 DIAGNOSIS — I700 Atherosclerosis of aorta: Secondary | ICD-10-CM | POA: Diagnosis not present

## 2021-01-29 DIAGNOSIS — N182 Chronic kidney disease, stage 2 (mild): Secondary | ICD-10-CM

## 2021-01-29 DIAGNOSIS — Z0001 Encounter for general adult medical examination with abnormal findings: Secondary | ICD-10-CM

## 2021-01-29 DIAGNOSIS — F325 Major depressive disorder, single episode, in full remission: Secondary | ICD-10-CM

## 2021-01-29 DIAGNOSIS — Z0000 Encounter for general adult medical examination without abnormal findings: Secondary | ICD-10-CM

## 2021-01-29 DIAGNOSIS — K589 Irritable bowel syndrome without diarrhea: Secondary | ICD-10-CM

## 2021-01-29 NOTE — Patient Instructions (Signed)

## 2021-01-30 LAB — CBC WITH DIFFERENTIAL/PLATELET
Absolute Monocytes: 440 cells/uL (ref 200–950)
Basophils Absolute: 61 cells/uL (ref 0–200)
Basophils Relative: 1.1 %
Eosinophils Absolute: 440 cells/uL (ref 15–500)
Eosinophils Relative: 8 %
HCT: 41.8 % (ref 35.0–45.0)
Hemoglobin: 14.2 g/dL (ref 11.7–15.5)
Lymphs Abs: 1353 cells/uL (ref 850–3900)
MCH: 31.7 pg (ref 27.0–33.0)
MCHC: 34 g/dL (ref 32.0–36.0)
MCV: 93.3 fL (ref 80.0–100.0)
MPV: 11.8 fL (ref 7.5–12.5)
Monocytes Relative: 8 %
Neutro Abs: 3207 cells/uL (ref 1500–7800)
Neutrophils Relative %: 58.3 %
Platelets: 154 10*3/uL (ref 140–400)
RBC: 4.48 10*6/uL (ref 3.80–5.10)
RDW: 13 % (ref 11.0–15.0)
Total Lymphocyte: 24.6 %
WBC: 5.5 10*3/uL (ref 3.8–10.8)

## 2021-01-30 LAB — COMPLETE METABOLIC PANEL WITH GFR
AG Ratio: 1.4 (calc) (ref 1.0–2.5)
ALT: 28 U/L (ref 6–29)
AST: 36 U/L — ABNORMAL HIGH (ref 10–35)
Albumin: 3.7 g/dL (ref 3.6–5.1)
Alkaline phosphatase (APISO): 147 U/L (ref 37–153)
BUN: 18 mg/dL (ref 7–25)
CO2: 28 mmol/L (ref 20–32)
Calcium: 8.9 mg/dL (ref 8.6–10.4)
Chloride: 106 mmol/L (ref 98–110)
Creat: 0.76 mg/dL (ref 0.50–1.05)
Globulin: 2.7 g/dL (calc) (ref 1.9–3.7)
Glucose, Bld: 72 mg/dL (ref 65–99)
Potassium: 4.2 mmol/L (ref 3.5–5.3)
Sodium: 140 mmol/L (ref 135–146)
Total Bilirubin: 0.5 mg/dL (ref 0.2–1.2)
Total Protein: 6.4 g/dL (ref 6.1–8.1)
eGFR: 85 mL/min/{1.73_m2} (ref >=60)

## 2021-01-30 LAB — TSH: TSH: 2.83 mIU/L (ref 0.40–4.50)

## 2021-01-30 LAB — HEMOGLOBIN A1C
Hgb A1c MFr Bld: 5.5 % of total Hgb (ref <5.7)
Mean Plasma Glucose: 111 mg/dL
eAG (mmol/L): 6.2 mmol/L

## 2021-01-30 LAB — LIPID PANEL
Cholesterol: 130 mg/dL (ref <200)
HDL: 47 mg/dL — ABNORMAL LOW (ref >=50)
LDL Cholesterol (Calc): 70 mg/dL (calc)
Non-HDL Cholesterol (Calc): 83 mg/dL (calc) (ref <130)
Total CHOL/HDL Ratio: 2.8 (calc) (ref <5.0)
Triglycerides: 53 mg/dL (ref <150)

## 2021-02-19 LAB — COLOGUARD: COLOGUARD: POSITIVE — AB

## 2021-02-20 ENCOUNTER — Encounter: Payer: Self-pay | Admitting: Nurse Practitioner

## 2021-02-21 ENCOUNTER — Other Ambulatory Visit: Payer: Self-pay | Admitting: Nurse Practitioner

## 2021-02-21 DIAGNOSIS — R195 Other fecal abnormalities: Secondary | ICD-10-CM

## 2021-03-09 DIAGNOSIS — M75100 Unspecified rotator cuff tear or rupture of unspecified shoulder, not specified as traumatic: Secondary | ICD-10-CM

## 2021-03-09 HISTORY — DX: Unspecified rotator cuff tear or rupture of unspecified shoulder, not specified as traumatic: M75.100

## 2021-04-10 ENCOUNTER — Other Ambulatory Visit: Payer: Self-pay | Admitting: Gastroenterology

## 2021-04-18 ENCOUNTER — Other Ambulatory Visit: Payer: Self-pay | Admitting: Internal Medicine

## 2021-04-18 ENCOUNTER — Other Ambulatory Visit: Payer: Self-pay | Admitting: Adult Health

## 2021-04-18 DIAGNOSIS — E782 Mixed hyperlipidemia: Secondary | ICD-10-CM

## 2021-04-18 DIAGNOSIS — E1122 Type 2 diabetes mellitus with diabetic chronic kidney disease: Secondary | ICD-10-CM

## 2021-04-18 DIAGNOSIS — B351 Tinea unguium: Secondary | ICD-10-CM

## 2021-04-30 ENCOUNTER — Encounter: Payer: Self-pay | Admitting: Internal Medicine

## 2021-04-30 MED ORDER — ALBUTEROL SULFATE HFA 108 (90 BASE) MCG/ACT IN AERS: INHALATION_SPRAY | RESPIRATORY_TRACT | 3 refills | Status: DC

## 2021-04-30 NOTE — Progress Notes (Signed)
Future Appointments  Date Time Provider Department  05/01/2021                 6 mo OV 11:30 AM [name], MD [facility]  10/21/2021                    CPE  10:00 AM [name], MD [facility]  01/05/2022 10:15 AM Megan Salon, MD [facility]  02/02/2022               Wellness 11:00 AM Magda Bernheim, NP [facility]    History of Present Illness:       This very nice   71 y.o.  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  Patient has GERD controlled on her meds. Patient is followed in pain Mgmt by Dr Hardin Negus & sees Dr Mina Marble for EDSI's.        Patient is treated for HTN (2007)  & BP has been controlled at home. Todays BP is at goal  -  130/88. Patient has had no complaints of any cardiac type chest pain, palpitations, dyspnea Vertell Limber /PND, dizziness, claudication, or dependent edema.       Hyperlipidemia is controlled with diet & Rosuvastatin. Patient denies myalgias or other med SEs. 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 She has had no symptoms of reactive hypoglycemia, diabetic polys, paresthesias or visual blurring.  Last A1c was normal & at goal :  Lab Results  Component Value Date   HGBA1C 5.5 01/29/2021                                                            Further, the patient also has history of Vitamin D Deficiency ("13" /2008) and supplements vitamin D without any suspected side-effects. Last vitamin D was at goal :  Lab Results  Component Value Date   VD25OH 68 10/21/2020     Current Outpatient Medications on File Prior to Visit  Medication Sig   albuterol HFA  inhaler Use 2 inhalations every 4 hours as  needed    ALPRAZolam 0.25 MG tablet Take 1/2 to 1 tablet 2 x / day if needed   b complex vitamins capsule Take 1 capsule daily.    VITAMIN D 20,000 u Take 1 capsule  daily.   clobetasol oint (TEMOVATE) 0.05 % Apply bid daily for 5-7 days with new symptoms.   COLACE 100 MG capsule Take 100 mg  2 times daily.   famotidine 10 MG tablet Take at bedtime.   LINZESS 145 MCG CAPS capsule Take every morning.   Magnesium 500 MG TABS Take 1 tablet daily.   metoCLOPramide 10 MG tablet Take  1 tablet  4 x /day  before Meals & Bedtime    milk thistle 175 MG tablet Take daily.   mometasone (ELOCON) 0.1 % oint Apply topically daily.  nystatin cream  Apply 1 application topically 2 times daily.    Omega-3 FISH OIL  Take 1 capsule  daily.   rosuvastatin  10 MG tablet TAKE 1 TABLET DAILY    terbinafine 250 MG tablet Take 1 tablet Daily for Nail Fungus   venlafaxine -XR) 150 MG  Take 2 (two) times daily.    vitamin E 180 MG (400 UNITS)  Take 400 Units  daily.   zaleplon  10 MG capsule Take  at bedtime as needed for sleep.    zinc gluconate 50 MG tablet Take daily.     Allergies  Allergen Reactions   Penicillins Anaphylaxis and Rash   Sulfa Antibiotics Shortness Of Breath    Also light headed, rash   Celebrex [Celecoxib] Other (See Comments)    Elevates Blood Pressure   Ciprofloxacin Nausea And Vomiting   Codeine  &  hydrocodone Nausea And Vomiting   Levaquin [Levofloxacin] Nausea And Vomiting   Motrin Ib [Ibuprofen] Hives   Prempro [Conj Estrog-Medroxyprogest Ace] Hives     PMHx:   Past Medical History:  Diagnosis Date   Abscessed tooth    Anemia    Anxiety    Arthritis    Bell's palsy 69/4854   Complication of anesthesia    COPD (chronic obstructive pulmonary disease) (HCC)    Depression    Diabetes mellitus without complication (HCC)    Fibroid uterus 12/20/2014   7cm, 3.4cm and 1.9cm fibroids    GERD (gastroesophageal reflux disease)    Hypertension    IBS (irritable bowel  syndrome) 12/89   Obesity hypoventilation syndrome ([place]) 11/19/2014   Obesity, Class III, BMI 40-49.9 (morbid obesity) ([place])    OSA (obstructive sleep apnea)  -  no cpap       Vitamin D deficiency      Past Surgical History:  Procedure Laterality Date   BLADDER SUSPENSION  03/2006, 07/2006, 01/2007   for stress incontinence   CHOLECYSTECTOMY N/A 12/08/2018   Procedure: LAPAROSCOPIC CHOLECYSTECTOMY;  Surgeon: Coralie Keens, MD;  Location: [place];  Service: General;  Laterality: N/A;   COLONOSCOPY  05/2005   Normal, recheck 10 yrs   LUMBAR FUSION Right 09/2016   L4-5 fusion by Dr. Sherley Bounds   TONSILLECTOMY AND ADENOIDECTOMY     TOTAL KNEE ARTHROPLASTY Left 2012   x 2 left knee   TOTAL KNEE REVISION  2014   TOTAL KNEE REVISION Left 08/2018   Dr Tracie Harrier, [facility]    FHx:    Reviewed / unchanged  SHx:    Reviewed / unchanged   Systems Review:  Constitutional: Denies fever, chills, wt changes, headaches, insomnia, fatigue, night sweats, change in appetite. Eyes: Denies redness, blurred vision, diplopia, discharge, itchy, watery eyes.  ENT: Denies discharge, congestion, post nasal drip, epistaxis, sore throat, earache, hearing loss, dental pain, tinnitus, vertigo, sinus pain, snoring.  CV: Denies chest pain, palpitations, irregular heartbeat, syncope, dyspnea, diaphoresis, orthopnea, PND, claudication or edema. Respiratory: denies cough, dyspnea, DOE, pleurisy, hoarseness, laryngitis, wheezing.  Gastrointestinal: Denies dysphagia, odynophagia, heartburn, reflux, water brash, abdominal pain or cramps, nausea, vomiting, bloating, diarrhea, constipation, hematemesis, melena, hematochezia  or hemorrhoids. Genitourinary: Denies dysuria, frequency, urgency, nocturia, hesitancy, discharge, hematuria or flank pain. Musculoskeletal: Denies arthralgias, myalgias, stiffness, jt. swelling, pain, limping or strain/sprain.  Skin: Denies pruritus, rash, hives, warts, acne, eczema or change  in skin lesion(s). Neuro: No weakness, tremor, incoordination, spasms, paresthesia or pain. Psychiatric: Denies confusion, memory loss or sensory loss. Endo: Denies change in weight,  skin or hair change.  Heme/Lymph: No excessive bleeding, bruising or enlarged lymph nodes.  Physical Exam  BP 130/88    Pulse 69    Temp 97.9 F (36.6 C)    Resp 18    Ht 5\' 5"  (1.651 m)    Wt 186 lb 12.8 oz (84.7 kg)    LMP 04/09/2009    SpO2 97%    BMI 31.09 kg/m   Appears  over nourished  and in no distress.  Eyes: PERRLA, EOMs, conjunctiva no swelling or erythema. Sinuses: No frontal/maxillary tenderness ENT/Mouth: EAC's clear, TM's nl w/o erythema, bulging. Nares clear w/o erythema, swelling, exudates. Oropharynx clear without erythema or exudates. Oral hygiene is good. Tongue normal, non obstructing. Hearing intact.  Neck: Supple. Thyroid not palpable. Car 2+/2+ without bruits, nodes or JVD. Chest: Respirations nl with BS clear & equal w/o rales, rhonchi, wheezing or stridor.  Cor: Heart sounds normal w/ regular rate and rhythm without sig. murmurs, gallops, clicks or rubs. Peripheral pulses normal and equal  without edema.  Abdomen: Soft, rotund  & bowel sounds normal. Non-tender w/o guarding, rebound, hernias, masses or organomegaly.  Lymphatics: Unremarkable.  Musculoskeletal: Full ROM all peripheral extremities, joint stability, 5/5 strength and normal gait.  Skin: Warm, dry without exposed rashes, lesions or ecchymosis apparent.  Neuro: Cranial nerves intact, reflexes equal bilaterally. Sensory-motor testing grossly intact. Tendon reflexes grossly intact.  Pysch: Alert & oriented x 3.  Insight and judgement nl & appropriate. No ideations.  Assessment and Plan:  1. Essential hypertension  - Continue medication, monitor blood pressure at home.  - Continue DASH diet.  Reminder to go to the ER if any CP,  SOB, nausea, dizziness, severe HA, changes vision/speech.   - CBC with  Differential/Platelet - Magnesium - TSH - COMPLETE METABOLIC PANEL WITH GFR  2. Hyperlipidemia associated with type 2 diabetes mellitus ([place])  - Continue diet/meds, exercise,& lifestyle modifications.  - Continue monitor periodic cholesterol/liver & renal functions    - Lipid panel - TSH  3. Controlled type 2 diabetes mellitus with stage 2 chronic kidney  disease, without long-term current use of insulin (HCC)  - Continue diet, exercise  - Lifestyle modifications.  - Monitor appropriate labs   - Hemoglobin A1c - Insulin, random - COMPLETE METABOLIC PANEL WITH GFR  4. Vitamin D deficiency  - Continue supplementation   - VITAMIN D 25 Hydroxy   5. Gastroesophageal reflux disease  - CBC with Differential/Platelet  6. Morbid obesity ([place]) - BMI 30+ with OSA, T2DM, htn, hyperlipidemia  - TSH  7. Medication management  - CBC with Differential/Platelet - Magnesium - Lipid panel - TSH - Hemoglobin A1c - Insulin, random - VITAMIN D 25 Hydroxy  - COMPLETE METABOLIC PANEL WITH GFR         Discussed  regular exercise, BP monitoring, weight control to achieve/maintain BMI less than 25 and discussed med and SE's. Recommended labs to assess and monitor clinical status with further disposition pending results of labs.  I discussed the assessment and treatment plan with the patient. The patient was provided an opportunity to ask questions and all were answered. The patient agreed with the plan and demonstrated an understanding of the instructions.  I provided over 30 minutes of exam, counseling, chart review and  complex critical decision making.        The patient was advised to call back or seek an in-person evaluation if the symptoms worsen or if the condition fails to improve as anticipated.  Kirtland Bouchard, MD

## 2021-04-30 NOTE — Patient Instructions (Signed)

## 2021-05-01 ENCOUNTER — Encounter: Payer: Self-pay | Admitting: Internal Medicine

## 2021-05-01 ENCOUNTER — Ambulatory Visit (INDEPENDENT_AMBULATORY_CARE_PROVIDER_SITE_OTHER): Payer: Medicare Other | Admitting: Internal Medicine

## 2021-05-01 ENCOUNTER — Other Ambulatory Visit: Payer: Self-pay

## 2021-05-01 VITALS — BP 130/88 | HR 69 | Temp 97.9°F | Resp 18 | Ht 65.0 in | Wt 186.8 lb

## 2021-05-01 DIAGNOSIS — K219 Gastro-esophageal reflux disease without esophagitis: Secondary | ICD-10-CM

## 2021-05-01 DIAGNOSIS — E1169 Type 2 diabetes mellitus with other specified complication: Secondary | ICD-10-CM | POA: Diagnosis not present

## 2021-05-01 DIAGNOSIS — E559 Vitamin D deficiency, unspecified: Secondary | ICD-10-CM

## 2021-05-01 DIAGNOSIS — Z79899 Other long term (current) drug therapy: Secondary | ICD-10-CM

## 2021-05-01 DIAGNOSIS — I10 Essential (primary) hypertension: Secondary | ICD-10-CM

## 2021-05-01 DIAGNOSIS — J449 Chronic obstructive pulmonary disease, unspecified: Secondary | ICD-10-CM

## 2021-05-01 DIAGNOSIS — E1122 Type 2 diabetes mellitus with diabetic chronic kidney disease: Secondary | ICD-10-CM

## 2021-05-01 DIAGNOSIS — N182 Chronic kidney disease, stage 2 (mild): Secondary | ICD-10-CM

## 2021-05-01 DIAGNOSIS — E785 Hyperlipidemia, unspecified: Secondary | ICD-10-CM

## 2021-05-02 LAB — LIPID PANEL
Cholesterol: 132 mg/dL (ref <200)
HDL: 49 mg/dL — ABNORMAL LOW (ref >=50)
LDL Cholesterol (Calc): 72 mg/dL (calc)
Non-HDL Cholesterol (Calc): 83 mg/dL (calc) (ref <130)
Total CHOL/HDL Ratio: 2.7 (calc) (ref <5.0)
Triglycerides: 40 mg/dL (ref <150)

## 2021-05-02 LAB — COMPLETE METABOLIC PANEL WITH GFR
AG Ratio: 1.5 (calc) (ref 1.0–2.5)
ALT: 29 U/L (ref 6–29)
AST: 30 U/L (ref 10–35)
Albumin: 4 g/dL (ref 3.6–5.1)
Alkaline phosphatase (APISO): 149 U/L (ref 37–153)
BUN: 22 mg/dL (ref 7–25)
CO2: 28 mmol/L (ref 20–32)
Calcium: 9.3 mg/dL (ref 8.6–10.4)
Chloride: 105 mmol/L (ref 98–110)
Creat: 0.86 mg/dL (ref 0.50–1.05)
Globulin: 2.7 g/dL (calc) (ref 1.9–3.7)
Glucose, Bld: 87 mg/dL (ref 65–99)
Potassium: 4.2 mmol/L (ref 3.5–5.3)
Sodium: 140 mmol/L (ref 135–146)
Total Bilirubin: 0.4 mg/dL (ref 0.2–1.2)
Total Protein: 6.7 g/dL (ref 6.1–8.1)
eGFR: 74 mL/min/{1.73_m2} (ref >=60)

## 2021-05-02 LAB — HEMOGLOBIN A1C
Hgb A1c MFr Bld: 5.4 % of total Hgb (ref <5.7)
Mean Plasma Glucose: 108 mg/dL
eAG (mmol/L): 6 mmol/L

## 2021-05-02 LAB — CBC WITH DIFFERENTIAL/PLATELET
Absolute Monocytes: 429 cells/uL (ref 200–950)
Basophils Absolute: 50 cells/uL (ref 0–200)
Basophils Relative: 0.9 %
Eosinophils Absolute: 297 cells/uL (ref 15–500)
Eosinophils Relative: 5.4 %
HCT: 42.9 % (ref 35.0–45.0)
Hemoglobin: 14 g/dL (ref 11.7–15.5)
Lymphs Abs: 1386 cells/uL (ref 850–3900)
MCH: 30.3 pg (ref 27.0–33.0)
MCHC: 32.6 g/dL (ref 32.0–36.0)
MCV: 92.9 fL (ref 80.0–100.0)
MPV: 11.9 fL (ref 7.5–12.5)
Monocytes Relative: 7.8 %
Neutro Abs: 3339 cells/uL (ref 1500–7800)
Neutrophils Relative %: 60.7 %
Platelets: 147 10*3/uL (ref 140–400)
RBC: 4.62 10*6/uL (ref 3.80–5.10)
RDW: 12.5 % (ref 11.0–15.0)
Total Lymphocyte: 25.2 %
WBC: 5.5 10*3/uL (ref 3.8–10.8)

## 2021-05-02 LAB — VITAMIN D 25 HYDROXY (VIT D DEFICIENCY, FRACTURES): Vit D, 25-Hydroxy: 72 ng/mL (ref 30–100)

## 2021-05-02 LAB — TSH: TSH: 3.75 mIU/L (ref 0.40–4.50)

## 2021-05-02 LAB — MAGNESIUM: Magnesium: 2.1 mg/dL (ref 1.5–2.5)

## 2021-05-02 LAB — INSULIN, RANDOM: Insulin: 13 u[IU]/mL

## 2021-05-02 NOTE — Progress Notes (Signed)
=============================================================== °-   Test results slightly outside the reference range are not unusual. If there is anything important, I will review this with you,  otherwise it is considered normal test values.  If you have further questions,  please do not hesitate to contact me at the office or via My Chart.  =============================================================== ===============================================================  -  Total Chol = 132  / LDL Chol = 72  -    Both     Excellent   - Very low risk for Heart Attack  / Stroke ============================================================ ============================================================  -  A1c - Normal - No Diabetes   -  Great  ! =============================================================== ===============================================================  -   Vitamin D = 72  -  Excellent   -   Please keep dose same  =============================================================== ===============================================================  -   All Else - CBC - Kidneys - Electrolytes - Liver - Magnesium & Thyroid    - all  Normal / OK  =============================================================== ===============================================================

## 2021-06-20 ENCOUNTER — Encounter (HOSPITAL_COMMUNITY): Payer: Self-pay | Admitting: Gastroenterology

## 2021-06-27 ENCOUNTER — Encounter (HOSPITAL_COMMUNITY)
Admission: RE | Disposition: A | Discharge status: Home / Self Care | Payer: Self-pay | Source: Home / Self Care | Attending: Gastroenterology

## 2021-06-27 ENCOUNTER — Ambulatory Visit (HOSPITAL_BASED_OUTPATIENT_CLINIC_OR_DEPARTMENT_OTHER): Payer: Medicare Other | Admitting: Anesthesiology

## 2021-06-27 ENCOUNTER — Encounter (HOSPITAL_COMMUNITY): Payer: Self-pay | Admitting: Gastroenterology

## 2021-06-27 ENCOUNTER — Other Ambulatory Visit: Payer: Self-pay

## 2021-06-27 ENCOUNTER — Ambulatory Visit (HOSPITAL_COMMUNITY)
Admission: RE | Admit: 2021-06-27 | Discharge: 2021-06-27 | Disposition: A | Discharge status: Home / Self Care | Payer: Medicare Other | Attending: Gastroenterology | Admitting: Gastroenterology

## 2021-06-27 ENCOUNTER — Ambulatory Visit (HOSPITAL_COMMUNITY): Payer: Medicare Other | Admitting: Anesthesiology

## 2021-06-27 DIAGNOSIS — R195 Other fecal abnormalities: Secondary | ICD-10-CM | POA: Diagnosis not present

## 2021-06-27 DIAGNOSIS — E119 Type 2 diabetes mellitus without complications: Secondary | ICD-10-CM | POA: Diagnosis not present

## 2021-06-27 DIAGNOSIS — F17200 Nicotine dependence, unspecified, uncomplicated: Secondary | ICD-10-CM | POA: Diagnosis not present

## 2021-06-27 DIAGNOSIS — K635 Polyp of colon: Secondary | ICD-10-CM

## 2021-06-27 DIAGNOSIS — G4730 Sleep apnea, unspecified: Secondary | ICD-10-CM | POA: Diagnosis not present

## 2021-06-27 DIAGNOSIS — Z1211 Encounter for screening for malignant neoplasm of colon: Secondary | ICD-10-CM | POA: Diagnosis not present

## 2021-06-27 DIAGNOSIS — D1779 Benign lipomatous neoplasm of other sites: Secondary | ICD-10-CM | POA: Insufficient documentation

## 2021-06-27 DIAGNOSIS — N182 Chronic kidney disease, stage 2 (mild): Secondary | ICD-10-CM

## 2021-06-27 DIAGNOSIS — E1122 Type 2 diabetes mellitus with diabetic chronic kidney disease: Secondary | ICD-10-CM

## 2021-06-27 DIAGNOSIS — K219 Gastro-esophageal reflux disease without esophagitis: Secondary | ICD-10-CM | POA: Diagnosis not present

## 2021-06-27 DIAGNOSIS — J449 Chronic obstructive pulmonary disease, unspecified: Secondary | ICD-10-CM | POA: Diagnosis not present

## 2021-06-27 DIAGNOSIS — F17210 Nicotine dependence, cigarettes, uncomplicated: Secondary | ICD-10-CM

## 2021-06-27 DIAGNOSIS — I10 Essential (primary) hypertension: Secondary | ICD-10-CM | POA: Diagnosis not present

## 2021-06-27 DIAGNOSIS — I129 Hypertensive chronic kidney disease with stage 1 through stage 4 chronic kidney disease, or unspecified chronic kidney disease: Secondary | ICD-10-CM | POA: Diagnosis not present

## 2021-06-27 DIAGNOSIS — D175 Benign lipomatous neoplasm of intra-abdominal organs: Secondary | ICD-10-CM

## 2021-06-27 DIAGNOSIS — D120 Benign neoplasm of cecum: Secondary | ICD-10-CM | POA: Insufficient documentation

## 2021-06-27 HISTORY — PX: COLONOSCOPY WITH PROPOFOL: SHX5780

## 2021-06-27 HISTORY — PX: POLYPECTOMY: SHX5525

## 2021-06-27 HISTORY — PX: HEMOSTASIS CLIP PLACEMENT: SHX6857

## 2021-06-27 LAB — GLUCOSE, CAPILLARY
Glucose-Capillary: 68 mg/dL — ABNORMAL LOW (ref 70–99)
Glucose-Capillary: 85 mg/dL (ref 70–99)

## 2021-06-27 SURGERY — COLONOSCOPY WITH PROPOFOL
Anesthesia: Monitor Anesthesia Care

## 2021-06-27 MED ORDER — PHENYLEPHRINE 80 MCG/ML (10ML) SYRINGE FOR IV PUSH (FOR BLOOD PRESSURE SUPPORT)
PREFILLED_SYRINGE | INTRAVENOUS | Status: DC | PRN
  Administered 2021-06-27 (×2): 160 ug via INTRAVENOUS

## 2021-06-27 MED ORDER — PROPOFOL 500 MG/50ML IV EMUL: INTRAVENOUS | Status: DC | PRN

## 2021-06-27 MED ORDER — DEXTROSE 50 % IV SOLN
INTRAVENOUS | Status: AC
Start: 2021-06-27 — End: ?
  Filled 2021-06-27: qty 50

## 2021-06-27 MED ORDER — EPHEDRINE SULFATE-NACL 50-0.9 MG/10ML-% IV SOSY
PREFILLED_SYRINGE | INTRAVENOUS | Status: DC | PRN
  Administered 2021-06-27: 5 mg via INTRAVENOUS
  Administered 2021-06-27 (×2): 10 mg via INTRAVENOUS

## 2021-06-27 MED ORDER — SODIUM CHLORIDE 0.9 % IV SOLN: INTRAVENOUS | Status: DC

## 2021-06-27 MED ORDER — PROPOFOL 10 MG/ML IV BOLUS
INTRAVENOUS | Status: DC | PRN
  Administered 2021-06-27 (×9): 50 mg via INTRAVENOUS

## 2021-06-27 MED ORDER — DEXTROSE 50 % IV SOLN
12.5000 g | INTRAVENOUS | Status: AC
  Administered 2021-06-27: 12.5 g via INTRAVENOUS

## 2021-06-27 MED ORDER — LACTATED RINGERS IV SOLN
INTRAVENOUS | Status: AC | PRN
  Administered 2021-06-27: 1000 mL via INTRAVENOUS

## 2021-06-27 MED ORDER — LACTATED RINGERS IV SOLN: INTRAVENOUS | Status: DC | PRN

## 2021-06-27 SURGICAL SUPPLY — 22 items

## 2021-06-27 NOTE — Op Note (Signed)
[facility] ?Patient Name: Beth [name] ?Procedure Date: 06/27/2021 ?MRN: [id number] ?Attending MD: Carol Ada , MD ?Date of Birth: 02-27-1951 ?CSN: [id number] ?Age: 71 ?Admit Type: Outpatient ?Procedure:                Colonoscopy ?Indications:              Positive Cologuard test ?Providers:                Carol Ada, MD, Ervin Knack, [facility],  ?                          Technician ?Referring MD:              ?Medicines:                Propofol per Anesthesia ?Complications:            No immediate complications. ?Estimated Blood Loss:     Estimated blood loss: none. ?Procedure:                Pre-Anesthesia Assessment: ?                          - Prior to the procedure, a History and Physical  ?                          was performed, and patient medications and  ?                          allergies were reviewed. The patient's tolerance of  ?                          previous anesthesia was also reviewed. The risks  ?                          and benefits of the procedure and the sedation  ?                          options and risks were discussed with the patient.  ?                          All questions were answered, and informed consent  ?                          was obtained. Prior Anticoagulants: The patient has  ?                          taken no previous anticoagulant or antiplatelet  ?                          agents. ASA Grade Assessment: III - A patient with  ?                          severe systemic disease. After reviewing the risks  ?                          and benefits, the  patient was deemed in  ?                          satisfactory condition to undergo the procedure. ?                          - Sedation was administered by an anesthesia  ?                          professional. Deep sedation was attained. ?                          After obtaining informed consent, the colonoscope  ?                          was passed under direct vision. Throughout the  ?                           procedure, the patient's blood pressure, pulse, and  ?                          oxygen saturations were monitored continuously. The  ?                          CF-HQ190L ([id number]) Olympus colonoscope was  ?                          introduced through the anus and advanced to the the  ?                          cecum, identified by appendiceal orifice and  ?                          ileocecal valve. The colonoscopy was performed  ?                          without difficulty. The patient tolerated the  ?                          procedure well. The quality of the bowel  ?                          preparation was evaluated using the BBPS [facility]  ?                          Bowel Preparation Scale) with scores of: Right  ?                          Colon = 3 (entire mucosa seen well with no residual  ?                          staining, small fragments of stool or opaque  ?  liquid), Transverse Colon = 3 (entire mucosa seen  ?                          well with no residual staining, small fragments of  ?                          stool or opaque liquid) and Left Colon = 3 (entire  ?                          mucosa seen well with no residual staining, small  ?                          fragments of stool or opaque liquid). The total  ?                          BBPS score equals 9. The quality of the bowel  ?                          preparation was good. The ileocecal valve,  ?                          appendiceal orifice, and rectum were photographed. ?Scope In: 9:15:52 AM ?Scope Out: 10:01:45 AM ?Scope Withdrawal Time: 0 hours 38 minutes 31 seconds  ?Total Procedure Duration: 0 hours 45 minutes 53 seconds  ?Findings: ?     Three sessile polyps were found in the cecum. The polyps were 5 to 20 mm  ?     in size. These polyps were removed with a cold snare. Resection and  ?     retrieval were complete. To prevent bleeding post-intervention, five  ?     hemostatic clips were  successfully placed. There was no bleeding at the  ?     end of the procedure. ?     There was a medium-sized lipoma, in the ascending colon. ?     In the cecum three large polyps were identified. The largest measured 20  ?     mm and it was removed in a piecemeal fashion with a cold snare. A total  ?     of 5 hemoclips were deployed to prevent any post polypectomy bleeding. ?Impression:               - Three 5 to 20 mm polyps in the cecum, removed  ?                          with a cold snare. Resected and retrieved. Clips  ?                          were placed. ?                          - Medium-sized lipoma in the ascending colon. ?Moderate Sedation: ?     Not Applicable - Patient had care per Anesthesia. ?Recommendation:           - Patient has a contact number available for  ?  emergencies. The signs and symptoms of potential  ?                          delayed complications were discussed with the  ?                          patient. Return to normal activities tomorrow.  ?                          Written discharge instructions were provided to the  ?                          patient. ?                          - Resume previous diet. ?                          - Continue present medications. ?                          - Await pathology results. ?                          - Repeat colonoscopy in 6 months for surveillance. ?Procedure Code(s):        --- Professional --- ?                          [phone], Colonoscopy, flexible; with removal of  ?                          tumor(s), polyp(s), or other lesion(s) by snare  ?                          technique ?Diagnosis Code(s):        --- Professional --- ?                          K63.5, Polyp of colon ?                          D17.5, Benign lipomatous neoplasm of  ?                          intra-abdominal organs ?                          R19.5, Other fecal abnormalities ?CPT copyright 2019 American Medical Association. All rights  reserved. ?The codes documented in this report are preliminary and upon coder review may  ?be revised to meet current compliance requirements. ?Carol Ada, MD ?Carol Ada, MD ?06/27/2021 10:08:17 AM ?This report has been signed electronically. ?Number of Addenda: 0 ?

## 2021-06-27 NOTE — Anesthesia Postprocedure Evaluation (Signed)
Anesthesia Post Note ? ?Patient: Beola Cord ? ?Procedure(s) Performed: COLONOSCOPY WITH PROPOFOL ?POLYPECTOMY ?HEMOSTASIS CLIP PLACEMENT ? ?  ? ?Patient location during evaluation: PACU ?Anesthesia Type: MAC ?Level of consciousness: awake and alert ?Pain management: pain level controlled ?Vital Signs Assessment: post-procedure vital signs reviewed and stable ?Respiratory status: spontaneous breathing, nonlabored ventilation, respiratory function stable and patient connected to nasal cannula oxygen ?Cardiovascular status: stable and blood pressure returned to baseline ?Postop Assessment: no apparent nausea or vomiting ?Anesthetic complications: no ? ? ?No notable events documented. ? ?Last Vitals:  ?Vitals:  ? 06/27/21 1030 06/27/21 1037  ?BP: 118/79 113/61  ?Pulse: 79 78  ?Resp: 16 16  ?Temp:    ?SpO2: 99% 97%  ?  ?Last Pain:  ?Vitals:  ? 06/27/21 1037  ?TempSrc:   ?PainSc: 2   ? ? ?  ?  ?  ?  ?  ?  ? ?Suzette Battiest E ? ? ? ? ?

## 2021-06-27 NOTE — Anesthesia Procedure Notes (Signed)
Procedure Name: [place] ?Date/Time: 06/27/2021 9:08 AM ?Performed by: Rosaland Lao, CRNA ?Pre-anesthesia Checklist: Patient identified, Emergency Drugs available, Suction available, Patient being monitored and Timeout performed ?Patient Re-evaluated:Patient Re-evaluated prior to induction ?Oxygen Delivery Method: Simple face mask ?Preoxygenation: Pre-oxygenation with 100% oxygen ?Placement Confirmation: positive ETCO2 ? ? ? ? ?

## 2021-06-27 NOTE — Transfer of Care (Signed)
Immediate Anesthesia Transfer of Care Note ? ?Patient: Beth [name] ? ?Procedure(s) Performed: COLONOSCOPY WITH PROPOFOL ?POLYPECTOMY ?HEMOSTASIS CLIP PLACEMENT ? ?Patient Location: PACU and Endoscopy Unit ? ?Anesthesia Type:MAC ? ?Level of Consciousness: awake, alert  and oriented ? ?Airway & Oxygen Therapy: Patient Spontanous Breathing and Patient connected to face mask ? ?Post-op Assessment: Report given to RN and Post -op Vital signs reviewed and stable ? ?Post vital signs: Reviewed and stable ? ?Last Vitals:  ?Vitals Value Taken Time  ?BP 111/64 06/27/21 1005  ?Temp    ?Pulse 81 06/27/21 1007  ?Resp 19 06/27/21 1007  ?SpO2 100 % 06/27/21 1007  ?Vitals shown include unvalidated device data. ? ?Last Pain:  ?Vitals:  ? 06/27/21 0903  ?TempSrc: Oral  ?PainSc: 0-No pain  ?   ? ?  ? ?Complications: No notable events documented. ?

## 2021-06-27 NOTE — H&P (Signed)
Beth [name] ?HPI: This 71 year old white female presents to the office for colorectal cancer screening. She had a positive Cologuard DNA Stool test done by her PCP [name] in December 2022. She has occasional constipation and can go 1-2 days without a BM. She takes a stool softener as needed. She was taking Linzess 145 mcg every other day for constipation. She ran out of her prescription 3 weeks ago. She has 1- 3 BM's per day with no obvious blood or mucus in the stool. She has occasional acid reflux. She has a past history of elevated liver enzymes. Her most recent labs done on 02/12/2021 revealed at AST of 36; the rest of the liver panel was within normal limits. She denies any NSAID use or any alcohol consumption. She has a good appetite. She has lost 81 pounds since 2019 with change in diet and exercise. She denies having any complaints of abdominal pain, nausea, vomiting, dysphagia or odynophagia. She denies having a family history of colon cancer, celiac sprue or IBD. Her last colonoscopy done on 10/12/12 revealed internal hemorrhoids, a serrated polyp and hyperplastic polyp were removed. She was due for a colonoscopy in 2019 but was not schedule it due to revision on her left knee for the third time on 08/30/2018.   ? ?Past Medical History:  ?Diagnosis Date  ? Abscessed tooth   ? Anemia   ? Anxiety   ? Arthritis   ? Bell's palsy 01/1985  ? COPD (chronic obstructive pulmonary disease) ([place])   ? Depression   ? Diabetes mellitus without complication ([place])   ? Fibroid uterus 12/20/2014  ? 7cm, 3.4cm and 1.9cm fibroids   ? GERD (gastroesophageal reflux disease)   ? Hypertension   ? IBS (irritable bowel syndrome) 02/1988  ? Obesity hypoventilation syndrome ([place]) 11/19/2014  ? Obesity, Class III, BMI 40-49.9 (morbid obesity) ([place])   ? OSA (obstructive sleep apnea)   ? no cpap  ? Vitamin D deficiency   ? ? ?Past Surgical History:  ?Procedure Laterality Date  ? BLADDER SUSPENSION  03/2006, 07/2006, 01/2007  ?  for stress incontinence  ? CHOLECYSTECTOMY N/A 12/08/2018  ? Procedure: LAPAROSCOPIC CHOLECYSTECTOMY;  Surgeon: Coralie Keens, MD;  Location: [place];  Service: General;  Laterality: N/A;  ? COLONOSCOPY  05/2005  ? Normal, recheck 10 yrs  ? LUMBAR FUSION Right 09/2016  ? L4-5 fusion by Dr. Sherley Bounds  ? TONSILLECTOMY AND ADENOIDECTOMY    ? TOTAL KNEE ARTHROPLASTY Left 2012  ? x 2 left knee  ? TOTAL KNEE REVISION  2014  ? TOTAL KNEE REVISION Left 08/2018  ? Dr Tracie Harrier, [facility] [place]  ? ? ?Family History  ?Problem Relation Age of Onset  ? Hypertension Father   ? Diabetes Father   ? Parkinson's disease Father   ? Dementia Mother   ? Heart attack Paternal Grandfather 44  ?     Died MI  ? ? ?Social History:  reports that she has been smoking cigarettes. She started smoking about 50 years ago. She has a 35.25 pack-year smoking history. She has never used smokeless tobacco. She reports that she does not drink alcohol and does not use drugs. ? ?Allergies:  ?Allergies  ?Allergen Reactions  ? Penicillins Anaphylaxis and Rash  ?   ?  ? Sulfa Antibiotics Shortness Of Breath and Rash  ?  Also light headed  ? Hydrocodone Nausea And Vomiting  ? Celebrex [Celecoxib] Other (See Comments)  ?  Elevates Blood Pressure ?  ?  Ciprofloxacin Nausea And Vomiting  ? Codeine Nausea And Vomiting  ? Levaquin [Levofloxacin] Nausea And Vomiting  ? Motrin Ib [Ibuprofen] Hives  ? Prempro [Conj Estrog-Medroxyprogest Ace] Hives  ? ? ?Medications: Scheduled: ? dextrose  12.5 g Intravenous STAT  ? ?Continuous: ? sodium chloride    ? ? ?Results for orders placed or performed during the hospital encounter of 06/27/21 (from the past 24 hour(s))  ?Glucose, capillary     Status: Abnormal  ? Collection Time: 06/27/21  8:49 AM  ?Result Value Ref Range  ? Glucose-Capillary 68 (L) 70 - 99 mg/dL  ?  ? ?No results found. ? ?ROS:  As stated above in the HPI otherwise negative. ? ?Last menstrual period 04/09/2009.   ? ?PE: ?Gen: NAD, Alert and Oriented ?HEENT:   [place]/AT, EOMI ?Neck: Supple, no LAD ?Lungs: CTA Bilaterally ?CV: RRR without M/G/R ?ABD: Soft, NTND, +BS ?Ext: No C/C/E ? ?Assessment/Plan: ?1) Positive Cologuard - colonoscopy. ? ?Wenonah Milo D ?06/27/2021, 8:57 AM  ? ? ?  ? ?

## 2021-06-27 NOTE — Discharge Instructions (Signed)

## 2021-06-27 NOTE — Anesthesia Preprocedure Evaluation (Signed)
Anesthesia Evaluation  ?Patient identified by MRN, date of birth, ID band ?Patient awake ? ? ? ?Reviewed: ?Allergy & Precautions, NPO status , Patient's Chart, lab work & pertinent test results ? ?Airway ?Mallampati: II ? ?TM Distance: >3 FB ?Neck ROM: Full ? ? ? Dental ? ?(+) Dental Advisory Given ?  ?Pulmonary ?sleep apnea , COPD, Current Smoker and Patient abstained from smoking.,  ?  ?breath sounds clear to auscultation ? ? ? ? ? ? Cardiovascular ?hypertension,  ?Rhythm:Regular Rate:Normal ? ? ?  ?Neuro/Psych ? Neuromuscular disease   ? GI/Hepatic ?Neg liver ROS, GERD  ,  ?Endo/Other  ?diabetes ? Renal/GU ?CRFRenal disease  ? ?  ?Musculoskeletal ? ?(+) Arthritis ,  ? Abdominal ?  ?Peds ? Hematology ?negative hematology ROS ?(+)   ?Anesthesia Other Findings ? ? Reproductive/Obstetrics ? ?  ? ? ? ? ? ? ? ? ? ? ? ? ? ?  ?  ? ? ? ? ? ? ? ? ?Anesthesia Physical ?Anesthesia Plan ? ?ASA: 3 ? ?Anesthesia Plan: MAC  ? ?Post-op Pain Management: Minimal or no pain anticipated  ? ?Induction:  ? ?PONV Risk Score and Plan: 1 and Propofol infusion and Treatment may vary due to age or medical condition ? ?Airway Management Planned: Natural Airway and Simple Face Mask ? ?Additional Equipment:  ? ?Intra-op Plan:  ? ?Post-operative Plan:  ? ?Informed Consent: I have reviewed the patients History and Physical, chart, labs and discussed the procedure including the risks, benefits and alternatives for the proposed anesthesia with the patient or authorized representative who has indicated his/her understanding and acceptance.  ? ? ? ? ? ?Plan Discussed with:  ? ?Anesthesia Plan Comments:   ? ? ? ? ? ? ?Anesthesia Quick Evaluation ? ?

## 2021-06-27 NOTE — Progress Notes (Signed)
Hypoglycemic Event ? ?CBG: 68  ? ?Treatment: D50 25 mL (12.5 gm) ? ?Symptoms: None ? ?Follow-up CBG: Time: 1005 CBG Result:85 ? ?Possible Reasons for Event: Other: colon prep for colonoscopy ? ?Comments/MD notified:yes ? ? ? ?Beth [name] ? ? ?

## 2021-06-30 ENCOUNTER — Encounter (HOSPITAL_COMMUNITY): Payer: Self-pay | Admitting: Gastroenterology

## 2021-06-30 LAB — SURGICAL PATHOLOGY

## 2021-07-15 NOTE — Progress Notes (Signed)
? ? ?Future Appointments  ?Date Time Provider Department  ?07/16/2021  2:30 PM [name], MD [facility]  ?08/14/2021                3 mo OV   3:30 PM Liane Comber, NP [facility]  ?11/20/2021                 CPE   3:00 PM [name], MD [facility]  ?01/05/2022 10:15 AM Megan Salon, MD [facility]  ?02/02/2022             Wellness 11:00 AM Magda Bernheim, NP [facility]  ? ? ?History of Present Illness: ? ?                                                         Patient is a very nice   71 y.o.  DWF who presents  with HTN, HLD, T2_NIDDM, OSA/CPAP,  GERD and Vitamin D Deficiency.  Patient is followed by Dr Hardin Negus in Pain Mgmt  & Dr Mina Marble for for [facility] for Ch LBP. She presents today with a 1 -2 week hx/o unprovoked "soreness" of  Rt  biceps area occasionally radiating to the  shoulder & wrist. Denies limitation of ROM due to pain.   ? ?Medications ? ? ?  rosuvastatin 10 MG tablet, TAKE 1 TABLET DAILY  ?  ALPRAZolam 0.25 MG tablet, Take 1/2 to 1 tablet 2 x / day ?  b complex vitamins capsule, Take 1 capsule daily. ?  BIOTIN , Take 1 tablet  daily. ?  VITAMIN D, Take 2,000 Units  daily. ?  COPPER PO, Take 1 tablet daily. ?  famotidine' \\10'$  MG tablet, Take 10 mg at bedtime as needed  ?  LINZESS 145 MCG CAPS capsule, Take  daily with supper. ?  Magnesium 500 MG TABS, Take daily. ?  metoCLOPramide Take 10 mg  at bedtime. ?  milk thistle 175 MG tablet, Take  daily. ?  mometasone (ELOCON) 0.1 % ointment, Apply  daily ?  Omega-3 FISH OIL , Take 1 capsule  daily. ?  venlafaxine -XR 150 MG , Take 150 mg  2  times daily.  ?  vitamin C 500 MG tablet, Take  daily. ?  vitamin E 180 MG (400 UNITS) capsule, Take  daily. ?  zaleplon (SONATA) 10 MG , Take  at bedtime as needed for sleep.  ?  zinc  50 MG tablet, Take daily. ? 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  ([place]); Depression, major, in remission ([place]); IBS (irritable bowel syndrome); Bell's palsy; Vitamin D deficiency; Medication management;  OSA and COPD overlap syndrome ([place]); GERD ; COPD (chronic obstructive pulmonary disease) ([place]); Iron deficiency anemia; Smoker; Seasonal allergies; Osteopenia; FHx: heart disease; Hyperlipidemia associated with type 2 diabetes mellitus ([place]); S/P laparoscopic cholecystectomy; Overweight (BMI 25.0-29.9); Aortic atherosclerosis by CTscan Jan 2021; CKD stage 2 due to type 2 diabetes mellitus ([place]); Hepatic steatosis; OAB (overactive bladder); and Lichen sclerosus on their problem list. ?  ?Observations/Objective: ? ?BP 118/70   Pulse 90   Temp 97.9 ?F (36.6 ?C)   Resp 16   Ht '5\' 5"'$  (1.651 m)   Wt 180 lb 6.4 oz (81.8 kg)   LMP 04/09/2009   SpO2 96%   BMI 30.02 kg/m?  ? ?HEENT -  WNL. ?Neck - supple.  ?Chest - Clear equal BS. ?Cor - Nl HS. RRR w/o sig MGR. PP 1(+). No edema. ?MS- FROM w/o deformities.  Tender along mid portion of the Rt biceps w/o a "lump" palpitated.Gait Nl. ?Neuro -  Nl w/o focal abnormalities. ? ?Assessment and Plan: ? ?1. Musculoskeletal strain ? ?- dexamethasone  4 MG tablet;  ?Take 1 tab 3 x /day for 3 days, then 2 x /day for 3 days, then 1 tab daily   ?Dispense: 20 tablet; Refill: 1 ? ?Follow Up Instructions: ?  ?    I discussed the assessment and treatment plan with the patient. The patient was provided an opportunity to ask questions and all were answered. The patient agreed with the plan and demonstrated an understanding of the instructions. ?  ?    The patient was advised to call back or seek an in-person evaluation if the symptoms worsen or if the condition fails to improve as anticipated. ? ? ? ?Kirtland Bouchard, MD ? ?

## 2021-07-16 ENCOUNTER — Ambulatory Visit (INDEPENDENT_AMBULATORY_CARE_PROVIDER_SITE_OTHER): Payer: Medicare Other | Admitting: Internal Medicine

## 2021-07-16 ENCOUNTER — Encounter: Payer: Self-pay | Admitting: Internal Medicine

## 2021-07-16 VITALS — BP 118/70 | HR 90 | Temp 97.9°F | Resp 16 | Ht 65.0 in | Wt 180.4 lb

## 2021-07-16 DIAGNOSIS — T148XXA Other injury of unspecified body region, initial encounter: Secondary | ICD-10-CM | POA: Diagnosis not present

## 2021-07-16 MED ORDER — DEXAMETHASONE 4 MG PO TABS: ORAL_TABLET | ORAL | 1 refills | Status: DC

## 2021-07-20 NOTE — Patient Instructions (Signed)
Muscle Strain A muscle strain is an injury that occurs when a muscle is stretched beyond its normal length. Usually, a small number of muscle fibers are torn when this happens. There are three types of muscle strains. First-degree strains have the least amount of muscle fiber tearing and the least amount of pain. Second-degree and third-degree strains have more tearing and pain. Usually, recovery from muscle strain takes 1-2 weeks. Complete healing normally takes 5-6 weeks. What are the causes? This condition is caused when a sudden, violent force is placed on a muscle and stretches it too far. This may occur with a fall, while lifting, or during sports. What increases the risk? This condition is more likely to develop in athletes and people who are physically active. What are the signs or symptoms? Symptoms of this condition include: Pain. Tenderness. Bruising. Swelling. Trouble using the muscle. How is this diagnosed? This condition is diagnosed based on a physical exam and your medical history. Tests may also be done, including an X-ray, ultrasound, or MRI. How is this treated? This condition is initially treated with PRICE therapy. This therapy involves: Protecting the muscle from being injured again. Resting the injured muscle. Icing the injured muscle. Applying pressure (compression) to the injured muscle. This may be done with a splint or elastic bandage. Raising (elevating) the injured muscle. Your health care provider may also recommend medicine for pain. Follow these instructions at home: If you have a removable splint: Wear the splint as told by your health care provider. Remove it only as told by your health care provider. Check the skin around the splint every day. Tell your health care provider about any concerns. Loosen the splint if your fingers or toes tingle, become numb, or turn cold and blue. Keep the splint clean. If the splint is not waterproof: Do not let it get  wet. Cover it with a watertight covering when you take a bath or a shower. Managing pain, stiffness, and swelling  If directed, put ice on the injured area. To do this: If you have a removable splint, remove it as told by your health care provider. Put ice in a plastic bag. Place a towel between your skin and the bag. Leave the ice on for 20 minutes, 2-3 times a day. Remove the ice if your skin turns bright red. This is very important. If you cannot feel pain, heat, or cold, you have a greater risk of damage to the area. Move your fingers or toes often to reduce stiffness and swelling. Raise (elevate) the injured area above the level of your heart while you are sitting or lying down. Wear an elastic bandage as told by your health care provider. Make sure that it is not too tight. General instructions Take over-the-counter and prescription medicines only as told by your health care provider. Treatment may include muscle relaxants or medicines for pain and inflammation that are taken by mouth or applied to the skin. Restrict your activity and rest the injured muscle as told by your health care provider. Gentle movements may be allowed. If physical therapy was prescribed, do exercises as told by your health care provider. Do not put pressure on any part of the splint until it is fully hardened. This may take several hours. Do not use any products that contain nicotine or tobacco. These products include cigarettes, chewing tobacco, and vaping devices, such as e-cigarettes. If you need help quitting, ask your health care provider. Ask your health care provider when it is   safe to drive if you have a splint. Keep all follow-up visits. This is important. How is this prevented? Warm up before exercising. This helps to prevent future muscle strains. Contact a health care provider if: You have more pain or swelling in the injured area. Get help right away if: You have numbness or tingling in the  injured area. You lose a lot of strength in the injured area. Summary A muscle strain is an injury that occurs when a muscle is stretched beyond its normal length. This condition is caused when a sudden, violent force is placed on a muscle and stretches it too far. This condition is initially treated with PRICE therapy, which involves protecting, resting, icing, compressing, and elevating. Gentle movements may be allowed. If physical therapy was prescribed, do exercises as told by your health care provider. This information is not intended to replace advice given to you by your health care provider. Make sure you discuss any questions you have with your health care provider. Document Revised: 05/13/2020 Document Reviewed: 05/13/2020 Elsevier Patient Education  2023 Elsevier Inc.  

## 2021-08-10 ENCOUNTER — Other Ambulatory Visit: Payer: Self-pay | Admitting: Internal Medicine

## 2021-08-10 DIAGNOSIS — B351 Tinea unguium: Secondary | ICD-10-CM

## 2021-08-14 ENCOUNTER — Ambulatory Visit (INDEPENDENT_AMBULATORY_CARE_PROVIDER_SITE_OTHER): Payer: Medicare Other | Admitting: Adult Health

## 2021-08-14 ENCOUNTER — Encounter: Payer: Self-pay | Admitting: Adult Health

## 2021-08-14 VITALS — BP 100/68 | HR 91 | Temp 97.5°F | Wt 185.0 lb

## 2021-08-14 DIAGNOSIS — E1122 Type 2 diabetes mellitus with diabetic chronic kidney disease: Secondary | ICD-10-CM

## 2021-08-14 DIAGNOSIS — I10 Essential (primary) hypertension: Secondary | ICD-10-CM

## 2021-08-14 DIAGNOSIS — J449 Chronic obstructive pulmonary disease, unspecified: Secondary | ICD-10-CM

## 2021-08-14 DIAGNOSIS — E785 Hyperlipidemia, unspecified: Secondary | ICD-10-CM

## 2021-08-14 DIAGNOSIS — Z86010 Personal history of colonic polyps: Secondary | ICD-10-CM

## 2021-08-14 DIAGNOSIS — I700 Atherosclerosis of aorta: Secondary | ICD-10-CM

## 2021-08-14 DIAGNOSIS — F325 Major depressive disorder, single episode, in full remission: Secondary | ICD-10-CM

## 2021-08-14 DIAGNOSIS — N182 Chronic kidney disease, stage 2 (mild): Secondary | ICD-10-CM

## 2021-08-14 DIAGNOSIS — E1169 Type 2 diabetes mellitus with other specified complication: Secondary | ICD-10-CM

## 2021-08-14 DIAGNOSIS — F17200 Nicotine dependence, unspecified, uncomplicated: Secondary | ICD-10-CM

## 2021-08-14 DIAGNOSIS — G4733 Obstructive sleep apnea (adult) (pediatric): Secondary | ICD-10-CM

## 2021-08-14 DIAGNOSIS — T148XXA Other injury of unspecified body region, initial encounter: Secondary | ICD-10-CM

## 2021-08-14 DIAGNOSIS — E559 Vitamin D deficiency, unspecified: Secondary | ICD-10-CM

## 2021-08-14 DIAGNOSIS — E663 Overweight: Secondary | ICD-10-CM

## 2021-08-14 DIAGNOSIS — Z79899 Other long term (current) drug therapy: Secondary | ICD-10-CM

## 2021-08-14 MED ORDER — DEXAMETHASONE 4 MG PO TABS: ORAL_TABLET | ORAL | 1 refills | Status: DC

## 2021-08-14 NOTE — Progress Notes (Signed)
FOLLOW UP  Assessment and Plan:   Atherosclerosis of aorta (HCC) - CT 03/2019 Control blood pressure, cholesterol, glucose, increase exercise.   Hypertension Well controlled off of medications Monitor blood pressure at home; patient to call if consistently greater than 130/80 Continue DASH diet.   Reminder to go to the ER if any CP, SOB, nausea, dizziness, severe HA, changes vision/speech, left arm numbness and tingling and jaw pain.  COPD Per imaging; asymptomatic, monitor;   OSA/obesity hypoventilation syndrome  Weight loss to achieve normal weight encouraged; has lost 60+ lb; not snoring; declines CPAP  Cholesterol Now taking rosuvastatin 10 mg daily LDL goal <70 Continue low cholesterol diet and exercise.  Check lipid panel.   Diabetes with diabetic chronic kidney disease Currently controlled in prediabetic range by diet/lifestyle only - discussed need for weight loss Continue diet and exercise.  Perform daily foot/skin check, notify office of any concerning changes.  Check A1C  Morbid obesity with co morbidities - BMI 30+ with OSA, T2DM, htn, hyperlipidemia Long discussion about weight loss, diet, and exercise Recommended diet heavy in fruits and veggies and low in animal meats, cheeses, and dairy products, appropriate calorie intake Discussed ideal weight for height  Patient will work on continue with exercise, portion control  Will follow up in 3 months  Vitamin D Def At goal at last visit; continue supplementation to maintain goal of 70-100 Defer Vit D level  GERD Symptoms well managed without breakthrough; continue current medications  Depression in remission/ anxiety Depression managed by  psych; she is working on slow benzo taper Lifestyle discussed: diet/exerise, sleep hygiene, stress management, hydration  Tobacco use Discussed risks associated with tobacco use and advised to reduce or quit Patient is not ready to do so, but advised to consider  strongly Will follow up at the next visit  R shoulder pain/biceps tenderness - unsure if did steroid taper, resent, check with pharmacy  - try to avoid leaning with cane on that side - ice, rest, follow up if not resolving  IBS- C Fairly managed with linzess - try probiotic, soluble fiber supplement -    Orders Placed This Encounter  Procedures   CBC with Differential/Platelet   COMPLETE METABOLIC PANEL WITH GFR   Magnesium   Lipid panel   TSH   Hemoglobin A1c     Continue diet and meds as discussed. Further disposition pending results of labs. Discussed med's effects and SE's.   Over 30 minutes of exam, counseling, chart review, and critical decision making was performed.   Future Appointments  Date Time Provider [place]  11/20/2021  3:00 PM [name], MD [facility] None  01/05/2022 10:15 AM Megan Salon, MD [facility]  02/02/2022 11:00 AM Alycia Rossetti, NP [facility] None    ----------------------------------------------------------------------------------------------------------------------  HPI 71 y.o. female  presents for 3 month follow up on hypertension, cholesterol, diabetes, morbid obesity, GERD, depression and vitamin D deficiency. Hx of lumbar surgery by Dr. Ronnald Ramp and has done well since.    She had large 20 mm polyp resected in 06/2021 in pieces, 6 month recall was suggested by Dr. Benson Norway, path showed adenomatous polyp.   She notes persistent R shoulder/biceps pain since that time, working with PT, Dr. Melford Aase sent decadron taper in May, can't recall if she actually picked this up or not -   She had unsuccessful revision of L knee arthroplasty with Dr. Tracie Harrier ([place] in [place]), still has some instability and does maintenance therapy/ exercise and using a  cane for stability.  she has a diagnosis of depression/anxiety, managed by NP Pauline Good, currently in remission on medications (venlafaxine daily, 0.25 mg xanax BID PRN,  reports symptoms are well controlled on current regimen). She is also prescribed zaleplon 10 mg RPN for sleep which she takes rarely. Using CBD oil at night for sleep.   she currently continues to smoke 0.5 pack a day; smoked 1 pack day from age [age], less before and has cut down recently; discussed risks associated with smoking, patient is not ready to quit but is working on tapering down. She has COPD diagnosed by imaging currently asymptomatic without daily inhaler. Estimated pack year history of 35 years. Had low dose chest CT 04/26/2020 which was benign.   She has hx of sleep apnea, reports was mild, was recommended CPAP but couldn't tolerate. Has sine lost weight. She denies AM headaches; no snoring.   she has GERD which is currently managed by famotidine 10 mg at night, and reglan 5 mg BID Has IBS- more constipation, Dr. Collene Mares has seen, taking linzess 145 mg every other night with good results. Also uses colace as needed.   BMI is Body mass index is 30.79 kg/m., she has been working on diet and exercise. Doing PT three days a week. She is down from 270 lb in Sept 2019. She has also been seeing a nutritionist and helping with GI sx and weight loss.  Wt Readings from Last 3 Encounters:  08/14/21 185 lb (83.9 kg)  07/16/21 180 lb 6.4 oz (81.8 kg)  06/27/21 182 lb (82.6 kg)   Her blood pressure has not been controlled at home, today their BP is BP: 100/68   She does workout. She denies chest pain, shortness of breath, dizziness.  1 vessel CAD and aortic atherosclerosis per CT 03/2019.    She is on cholesterol medication (rosuvastatin 10 mg daily) and denies myalgias. Her cholesterol is not at goal of LDL <70.  The cholesterol last visit was:   Lab Results  Component Value Date   CHOL 132 05/01/2021   HDL 49 (L) 05/01/2021   LDLCALC 72 05/01/2021   TRIG 40 05/01/2021   CHOLHDL 2.7 05/01/2021    She has been working on diet and exercise for T2DM currently controlled by diet alone (A1C  6.7% in 09/2018, has since improved to WNL), and denies foot ulcerations, increased appetite, nausea, paresthesia of the feet, polydipsia, polyuria, visual disturbances and vomiting.  No longer checking sugars due to consistently low values.  Last A1C in the office was:  Lab Results  Component Value Date   HGBA1C 5.4 05/01/2021    She has CKD II associated with T2DM and htn and monitored at this office:  Lab Results  Component Value Date   EGFR 74 05/01/2021   Patient is on Vitamin D supplement and at goal of 60:  Lab Results  Component Value Date   VD25OH 72 05/01/2021        Current Medications:  Current Outpatient Medications on File Prior to Visit  Medication Sig   ALPRAZolam (XANAX) 0.25 MG tablet Take 1/2 to 1 tablet 2 x / day if needed for anxiety. (Patient taking differently: Take 0.25 mg by mouth 2 (two) times daily.)   b complex vitamins capsule Take 1 capsule by mouth daily.   BIOTIN PO Take 1 tablet by mouth daily.   Cholecalciferol (VITAMIN D) 50 MCG (2000 UT) CAPS Take 2,000 Units by mouth daily.   COPPER PO Take 1  tablet by mouth daily.   famotidine (PEPCID) 10 MG tablet Take 10 mg by mouth at bedtime as needed for heartburn.   glucose blood (ACCU-CHEK AVIVA PLUS) test strip    LINZESS 145 MCG CAPS capsule Take 145 mcg by mouth daily with supper.   Magnesium 500 MG TABS Take 500 mg by mouth daily.   metoCLOPramide (REGLAN) 10 MG tablet Take  1 tablet  4 x /day  before Meals & Bedtime for Acid Reflux / Patient knows to take by mouth (Patient taking differently: Take 10 mg by mouth at bedtime.)   milk thistle 175 MG tablet Take 175 mg by mouth daily.   mometasone (ELOCON) 0.1 % ointment Apply topically daily. (Patient taking differently: Apply 1 application  topically daily as needed (rash).)   Omega-3 Fatty Acids (FISH OIL OMEGA-3 PO) Take 1 capsule by mouth daily.   ONETOUCH DELICA LANCETS 91Y MISC Check blood sugar 1 time daily-[id number]   ONETOUCH VERIO test strip  USE TO CHECK BLOOD SUGAR ONCE DAILY   rosuvastatin (CRESTOR) 10 MG tablet TAKE 1 TABLET BY MOUTH DAILY FOR CHOLESTEROL   venlafaxine XR (EFFEXOR-XR) 150 MG 24 hr capsule Take 150 mg by mouth 2 (two) times daily.    vitamin C (ASCORBIC ACID) 500 MG tablet Take 500 mg by mouth daily.   vitamin E 180 MG (400 UNITS) capsule Take 400 Units by mouth daily.   zaleplon (SONATA) 10 MG capsule Take 10 mg by mouth at bedtime as needed for sleep.   zinc gluconate 50 MG tablet Take 50 mg by mouth daily.   No current facility-administered medications on file prior to visit.     Allergies:  Allergies  Allergen Reactions   Penicillins Anaphylaxis and Rash        Sulfa Antibiotics Shortness Of Breath and Rash    Also light headed   Hydrocodone Nausea And Vomiting   Celebrex [Celecoxib] Other (See Comments)    Elevates Blood Pressure    Ciprofloxacin Nausea And Vomiting   Codeine Nausea And Vomiting   Levaquin [Levofloxacin] Nausea And Vomiting   Motrin Ib [Ibuprofen] Hives   Prempro [Conj Estrog-Medroxyprogest Ace] Hives     Medical History:  Past Medical History:  Diagnosis Date   Abscessed tooth    Anemia    Anxiety    Arthritis    Bell's palsy 01/1985   COPD (chronic obstructive pulmonary disease) (HCC)    Depression    Diabetes mellitus without complication (HCC)    Fibroid uterus 12/20/2014   7cm, 3.4cm and 1.9cm fibroids    GERD (gastroesophageal reflux disease)    Hypertension    IBS (irritable bowel syndrome) 02/1988   Obesity hypoventilation syndrome ([place]) 11/19/2014   Obesity, Class III, BMI 40-49.9 (morbid obesity) (HCC)    OSA (obstructive sleep apnea)    no cpap   Vitamin D deficiency    Family history- Reviewed and unchanged Social history- Reviewed and unchanged   Review of Systems:  Review of Systems  Constitutional:  Negative for malaise/fatigue and weight loss.  HENT:  Negative for hearing loss and tinnitus.   Eyes:  Negative for blurred vision and double  vision.  Respiratory:  Negative for cough, shortness of breath and wheezing.   Cardiovascular:  Negative for chest pain, palpitations, orthopnea, claudication and leg swelling.  Gastrointestinal:  Positive for constipation (chronic, IBS). Negative for abdominal pain, blood in stool, diarrhea, heartburn, melena, nausea and vomiting.  Genitourinary: Negative.   Musculoskeletal:  Positive for joint  pain (knee, R shoulder). Negative for myalgias.  Skin:  Negative for rash.  Neurological:  Negative for dizziness, tingling, sensory change, weakness and headaches.  Endo/Heme/Allergies:  Negative for polydipsia.  Psychiatric/Behavioral: Negative.    All other systems reviewed and are negative.     Physical Exam: BP 100/68   Pulse 91   Temp (!) 97.5 F (36.4 C)   Wt 185 lb (83.9 kg)   LMP 04/09/2009   SpO2 96%   BMI 30.79 kg/m  Wt Readings from Last 3 Encounters:  08/14/21 185 lb (83.9 kg)  07/16/21 180 lb 6.4 oz (81.8 kg)  06/27/21 182 lb (82.6 kg)   General Appearance: Well nourished, in no apparent distress. Eyes: PERRLA, EOMs, conjunctiva no swelling or erythema Sinuses: No Frontal/maxillary tenderness ENT/Mouth: Ext aud canals clear, TMs without erythema, bulging. No erythema, swelling, or exudate on post pharynx.  Tonsils not swollen or erythematous. Hearing normal.  Neck: Supple, thyroid normal.  Respiratory: Respiratory effort normal, BS equal bilaterally without rales, rhonchi, wheezing or stridor.  Cardio: RRR with no MRGs. Distal pulses intact  Abdomen: Soft, + BS.  Non tender, no guarding, rebound, hernias, masses. Lymphatics: Non tender without lymphadenopathy.  Musculoskeletal: Slow/weak sitting to standing, R biceps tenderness without lump or weakness, pain with R shoulder external rotation, slow gait with cane Skin: Warm, dry without rashes, lesions, ecchymosis.  Neuro: Cranial nerves intact. No cerebellar symptoms.  Psych: Awake and oriented X 3, normal affect,  Insight and Judgment appropriate.    Izora Ribas, NP 4:09 PM [facility]

## 2021-08-14 NOTE — Patient Instructions (Addendum)
Check with pharmacy if you picked up steroid taper in May - was sent in 07/16/2021,  if not, may be worth filling to try - resent today    Try "Align" probiotic - 2-3 months Can also do soluble fiber supplement - like citrucel, benefiber With plenty of fluids  Can help with IBS -      Shoulder Impingement Syndrome  Shoulder impingement syndrome is a condition that causes pain when connective tissues (tendons) surrounding the shoulder joint become pinched. These tendons are part of the group including muscles and tissues that help to stabilize the shoulder (rotator cuff). Beneath the rotator cuff is a fluid-filled sac (bursa) that allows the muscles and tendons to glide smoothly. The bursa may become swollen or irritated (bursitis). Bursitis, swelling in the rotator cuff tendons, or both conditions can decrease how much space is under a bone in the shoulder joint (acromion), resulting in impingement. What are the causes? Shoulder impingement syndrome may be caused by bursitis or swelling of the rotator cuff tendons, which may result from: Repetitive overhead arm movements. Falling onto the shoulder. Weakness in the shoulder muscles. What increases the risk? You may be more likely to develop this condition if you: Play sports that involve throwing, such as baseball. Participate in sports such as tennis, volleyball, and swimming. Work as a Curator, Games developer, or Architect. Some people are also more likely to develop impingement syndrome because of the shape of their acromion bone. What are the signs or symptoms? The main symptom of this condition is pain on the front or side of the shoulder. The pain may: Get worse when lifting or raising the arm. Get worse at night. Wake you up from sleeping. Feel sharp when the shoulder is moved and then fade to an ache. Other symptoms may include: Tenderness. Stiffness. Inability to raise the arm above shoulder level or behind the  body. Weakness. How is this diagnosed? This condition may be diagnosed based on: Your symptoms and medical history. A physical exam. Imaging tests, such as: X-rays. MRI. Ultrasound. How is this treated? This condition may be treated by: Resting your shoulder and avoiding all activities that cause pain or put stress on the shoulder. Icing your shoulder. NSAIDs to help reduce pain and swelling. One or more injections of medicines to numb the area and reduce inflammation. Physical therapy. Surgery. This may be needed if nonsurgical treatments have not helped. Surgery may involve repairing the rotator cuff, reshaping the acromion, or removing the bursa. Follow these instructions at home: Managing pain, stiffness, and swelling  If directed, put ice on the injured area. Put ice in a plastic bag. Place a towel between your skin and the bag. Leave the ice on for 20 minutes, 2-3 times a day. Activity Rest and return to your normal activities as told by your health care provider. Ask your health care provider what activities are safe for you. Do exercises as told by your health care provider. General instructions Do not use any products that contain nicotine or tobacco, such as cigarettes, e-cigarettes, and chewing tobacco. These can delay healing. If you need help quitting, ask your health care provider. Ask your health care provider when it is safe for you to drive. Take over-the-counter and prescription medicines only as told by your health care provider. Keep all follow-up visits as told by your health care provider. This is important. How is this prevented? Give your body time to rest between periods of activity. Be safe and  responsible while being active. This will help you avoid falls. Maintain physical fitness, including strength and flexibility. Contact a health care provider if: Your symptoms have not improved after 1-2 months of treatment and rest. You cannot lift your arm  away from your body. Summary Shoulder impingement syndrome is a condition that causes pain when connective tissues (tendons) surrounding the shoulder joint become pinched. The main symptom of this condition is pain on the front or side of the shoulder. This condition is usually treated with rest, ice, and pain medicines as needed. This information is not intended to replace advice given to you by your health care provider. Make sure you discuss any questions you have with your health care provider. Document Revised: 12/04/2020 Document Reviewed: 12/04/2020 Elsevier Patient Education  [place].

## 2021-08-15 LAB — CBC WITH DIFFERENTIAL/PLATELET
Absolute Monocytes: 290 cells/uL (ref 200–950)
Basophils Absolute: 21 cells/uL (ref 0–200)
Basophils Relative: 0.5 %
Eosinophils Absolute: 307 cells/uL (ref 15–500)
Eosinophils Relative: 7.3 %
HCT: 41.7 % (ref 35.0–45.0)
Hemoglobin: 13.5 g/dL (ref 11.7–15.5)
Lymphs Abs: 676 cells/uL — ABNORMAL LOW (ref 850–3900)
MCH: 30 pg (ref 27.0–33.0)
MCHC: 32.4 g/dL (ref 32.0–36.0)
MCV: 92.7 fL (ref 80.0–100.0)
MPV: 12 fL (ref 7.5–12.5)
Monocytes Relative: 6.9 %
Neutro Abs: 2906 cells/uL (ref 1500–7800)
Neutrophils Relative %: 69.2 %
Platelets: 144 10*3/uL (ref 140–400)
RBC: 4.5 10*6/uL (ref 3.80–5.10)
RDW: 13.1 % (ref 11.0–15.0)
Total Lymphocyte: 16.1 %
WBC: 4.2 10*3/uL (ref 3.8–10.8)

## 2021-08-15 LAB — COMPLETE METABOLIC PANEL WITH GFR
AG Ratio: 1.4 (calc) (ref 1.0–2.5)
ALT: 40 U/L — ABNORMAL HIGH (ref 6–29)
AST: 38 U/L — ABNORMAL HIGH (ref 10–35)
Albumin: 3.5 g/dL — ABNORMAL LOW (ref 3.6–5.1)
Alkaline phosphatase (APISO): 146 U/L (ref 37–153)
BUN: 23 mg/dL (ref 7–25)
CO2: 27 mmol/L (ref 20–32)
Calcium: 9 mg/dL (ref 8.6–10.4)
Chloride: 108 mmol/L (ref 98–110)
Creat: 0.72 mg/dL (ref 0.50–1.05)
Globulin: 2.5 g/dL (calc) (ref 1.9–3.7)
Glucose, Bld: 96 mg/dL (ref 65–99)
Potassium: 4.2 mmol/L (ref 3.5–5.3)
Sodium: 142 mmol/L (ref 135–146)
Total Bilirubin: 0.4 mg/dL (ref 0.2–1.2)
Total Protein: 6 g/dL — ABNORMAL LOW (ref 6.1–8.1)
eGFR: 91 mL/min/{1.73_m2} (ref >=60)

## 2021-08-15 LAB — LIPID PANEL
Cholesterol: 132 mg/dL (ref <200)
HDL: 49 mg/dL — ABNORMAL LOW (ref >=50)
LDL Cholesterol (Calc): 65 mg/dL (calc)
Non-HDL Cholesterol (Calc): 83 mg/dL (calc) (ref <130)
Total CHOL/HDL Ratio: 2.7 (calc) (ref <5.0)
Triglycerides: 98 mg/dL (ref <150)

## 2021-08-15 LAB — HEMOGLOBIN A1C
Hgb A1c MFr Bld: 5.5 % of total Hgb (ref <5.7)
Mean Plasma Glucose: 111 mg/dL
eAG (mmol/L): 6.2 mmol/L

## 2021-08-15 LAB — MAGNESIUM: Magnesium: 1.9 mg/dL (ref 1.5–2.5)

## 2021-08-15 LAB — TSH: TSH: 2.34 mIU/L (ref 0.40–4.50)

## 2021-08-16 ENCOUNTER — Other Ambulatory Visit: Payer: Self-pay | Admitting: Internal Medicine

## 2021-08-16 ENCOUNTER — Encounter: Payer: Self-pay | Admitting: Internal Medicine

## 2021-08-16 DIAGNOSIS — B351 Tinea unguium: Secondary | ICD-10-CM

## 2021-08-16 MED ORDER — TERBINAFINE HCL 250 MG PO TABS: ORAL_TABLET | ORAL | 0 refills | Status: DC

## 2021-08-19 ENCOUNTER — Other Ambulatory Visit: Payer: Self-pay | Admitting: Adult Health

## 2021-08-19 ENCOUNTER — Encounter: Payer: Self-pay | Admitting: Adult Health

## 2021-08-19 DIAGNOSIS — M25511 Pain in right shoulder: Secondary | ICD-10-CM

## 2021-08-20 ENCOUNTER — Ambulatory Visit
Admission: RE | Admit: 2021-08-20 | Discharge: 2021-08-20 | Disposition: A | Discharge status: Home / Self Care | Payer: Medicare Other | Source: Ambulatory Visit | Attending: Adult Health | Admitting: Adult Health

## 2021-08-20 DIAGNOSIS — M25511 Pain in right shoulder: Secondary | ICD-10-CM

## 2021-08-20 LAB — HM DEXA SCAN

## 2021-08-21 ENCOUNTER — Encounter: Payer: Self-pay | Admitting: Adult Health

## 2021-08-21 ENCOUNTER — Other Ambulatory Visit: Payer: Self-pay | Admitting: Adult Health

## 2021-08-21 DIAGNOSIS — M25511 Pain in right shoulder: Secondary | ICD-10-CM

## 2021-08-24 ENCOUNTER — Encounter: Payer: Self-pay | Admitting: Adult Health

## 2021-09-05 ENCOUNTER — Ambulatory Visit
Admission: RE | Admit: 2021-09-05 | Discharge: 2021-09-05 | Disposition: A | Discharge status: Home / Self Care | Payer: Medicare Other | Source: Ambulatory Visit | Attending: Adult Health | Admitting: Adult Health

## 2021-09-05 DIAGNOSIS — M25511 Pain in right shoulder: Secondary | ICD-10-CM

## 2021-09-06 ENCOUNTER — Other Ambulatory Visit: Payer: Self-pay | Admitting: Adult Health

## 2021-09-06 DIAGNOSIS — M75121 Complete rotator cuff tear or rupture of right shoulder, not specified as traumatic: Secondary | ICD-10-CM

## 2021-09-06 DIAGNOSIS — M75101 Unspecified rotator cuff tear or rupture of right shoulder, not specified as traumatic: Secondary | ICD-10-CM | POA: Insufficient documentation

## 2021-09-08 ENCOUNTER — Encounter: Payer: Self-pay | Admitting: Adult Health

## 2021-09-10 LAB — HM DIABETES EYE EXAM

## 2021-10-01 ENCOUNTER — Encounter: Payer: Self-pay | Admitting: Internal Medicine

## 2021-10-14 ENCOUNTER — Other Ambulatory Visit: Payer: Self-pay | Admitting: Gastroenterology

## 2021-10-21 ENCOUNTER — Encounter: Payer: Medicare Other | Admitting: Internal Medicine

## 2021-10-27 ENCOUNTER — Encounter: Payer: Self-pay | Admitting: Internal Medicine

## 2021-11-19 NOTE — Progress Notes (Unsigned)
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  Patient has been followed for HTN, HLD, T2_NIDDM and Vitamin D Deficiency. Patient also has GERD controlled on Pepcid, Reglan & Protonix.  Patient has hx/o Major Depression. Patient is on CPAP for OSA with hx/o COPD overlap. Patient is followed in Pain Management clinic by Dr Hardin Negus & sees Dr Mina Marble for EDSI's.             HTN predates since 2007. Patient's BP has been controlled at home and patient denies any cardiac symptoms as chest pain, palpitations, shortness of breath, dizziness or ankle swelling. Today's BP is at goal - 118/84 .        Patient's hyperlipidemia is controlled with diet and medications. Patient denies myalgias or other medication SE's. 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 Last A1c was normal & at goal:  Lab Results  Component Value Date   HGBA1C 5.3 05/14/2020         Finally, patient has history of Vitamin D Deficiency ("13" /2008) and last Vitamin D was at goal:  Lab Results  Component Value Date   VD25OH 97 05/14/2020       Current Outpatient Medications:    ALPRAZolam (XANAX) 0.25 MG tablet, Take 1/2 to 1 tablet 2 x / day if needed for anxiety. (Patient taking differently: Take 0.25 mg by mouth 2 (two) times daily.), Disp: 60 tablet, Rfl: 0   b complex vitamins capsule, Take 1 capsule by mouth daily., Disp: , Rfl:    BIOTIN PO, Take 1 tablet by mouth daily., Disp: , Rfl:    Cholecalciferol (VITAMIN D) 50 MCG (2000 UT) CAPS, Take 2,000 Units by mouth daily., Disp: , Rfl:    COPPER PO, Take 1 tablet by mouth daily., Disp: , Rfl:    famotidine (PEPCID) 10 MG tablet, Take 10 mg by mouth at bedtime as needed for heartburn., Disp: , Rfl:    glucose blood (ACCU-CHEK AVIVA PLUS) test strip, , Disp: , Rfl:    LINZESS 145 MCG CAPS capsule, Take 145 mcg by mouth daily with supper., Disp: , Rfl:    Magnesium 500 MG TABS, Take 500 mg by mouth daily., Disp: , Rfl:    metoCLOPramide (REGLAN) 10 MG tablet, Take  1 tablet  4 x /day  before Meals & Bedtime  for Acid Reflux / Patient knows to take by mouth (Patient taking differently: Take 10 mg by mouth at bedtime.), Disp: 360 tablet, Rfl: 3   milk thistle 175 MG tablet, Take 175 mg by mouth daily., Disp: , Rfl:    Omega-3 Fatty Acids (FISH OIL OMEGA-3 PO), Take 1 capsule by mouth daily., Disp: , Rfl:    ONETOUCH DELICA LANCETS 94W MISC, Check blood sugar 1 time daily-DX-E11.22, Disp: 100 each, Rfl: 4   ONETOUCH VERIO test strip, USE TO CHECK BLOOD SUGAR ONCE DAILY, Disp: 100 each, Rfl: 3   rosuvastatin (CRESTOR) 10 MG tablet, TAKE 1 TABLET BY MOUTH DAILY FOR CHOLESTEROL, Disp: 90 tablet, Rfl: 3   terbinafine (LAMISIL) 250 MG tablet, Take 1 tablet Daily for 1 month on & 1 month off for Nail Fungus ( ie., skip every other month to extend over 6 months), Disp: 90 tablet, Rfl: 0   venlafaxine XR (EFFEXOR-XR) 150 MG 24 hr capsule, Take 150 mg by mouth 2 (two) times daily. , Disp: , Rfl:    vitamin C (ASCORBIC ACID) 500 MG tablet, Take 500 mg by mouth daily., Disp: , Rfl:     vitamin E 180 MG (400 UNITS) capsule, Take 400 Units by mouth daily., Disp: , Rfl:    zaleplon (SONATA) 10 MG capsule, Take 10 mg by mouth at bedtime as needed for sleep., Disp: , Rfl:    zinc gluconate 50 MG tablet, Take 50 mg by mouth daily., Disp: , Rfl:     Allergies  Allergen Reactions   Penicillins Anaphylaxis and Rash   Sulfa Antibiotics Shortness Of Breath    Also light headed, rash   Celebrex [Celecoxib] Other (See Comments)    Elevates Blood Pressure   Ciprofloxacin Nausea And Vomiting   Codeine And hydrocodone Nausea And Vomiting   Levaquin [Levofloxacin] Nausea And Vomiting   Motrin Ib [Ibuprofen] Hives   Prempro [Conj Estrog-Medroxyprogest Ace] Hives     Past Medical History:  Diagnosis Date   Abscessed tooth    Anemia    Anxiety    Arthritis    Bell's palsy 54/6270   Complication of anesthesia    COPD (chronic obstructive pulmonary disease) (HCC)    Depression    Diabetes mellitus without complication ([place])    Fibroid uterus 12/20/2014   7cm, 3.4cm and 1.9cm fibroids    GERD (gastroesophageal reflux disease)    Hypertension    IBS (irritable bowel syndrome) 12/89   Obesity hypoventilation syndrome ([place]) 11/19/2014   Obesity, Class III, BMI 40-49.9 (morbid obesity) (HCC)    OSA (obstructive sleep apnea)    Vitamin D deficiency      Health Maintenance  Topic Date Due   Zoster Vaccines- Shingrix (1 of 2) Never done   COLONOSCOPY  10/12/2017   URINE MICROALBUMIN  08/05/2018   COVID-19 Vaccine (4 - Booster for Moderna series) 04/18/2020   INFLUENZA VACCINE  10/07/2020   FOOT EXAM  10/30/2020   HEMOGLOBIN A1C  11/14/2020   OPHTHALMOLOGY EXAM  09/03/2021   MAMMOGRAM  10/03/2022   TETANUS/TDAP  05/20/2025   DEXA SCAN  Completed   Hepatitis C Screening  Completed   PNA vac Low Risk Adult  Completed   HPV VACCINES  Aged Out    Immunization History  Administered Date(s) Administered   DT  05/21/2015   Influenza, High Dose 01/26/2019, 12/08/2019    Influenza,inj 12/02/2016   Influenza,inj,quad 12/09/2015   Influenza- 12/07/2016   Moderna Sars-Covid-2 Vaccination 04/24/2019, 05/23/2019, 01/17/2020  PPD Test 03/21/2013, 05/21/2015   Pneumococcal - 13 01/04/2014   Pneumococcal - 23 03/09/2004, 11/11/2017   Td 03/09/2004   Tdap 03/10/2015   Typhoid Inactivated 05/10/2014   Zoster, Live 12/22/2012    Last Colon for (+) Cologard -  06/27/2021 - Dr Carol Ada  - Recc 6 month f/u   Screening Lung CT  - 04/28/2020 - recc 1 year f/u  Last MGM - 10/02/2020 Last dexa BMD  - 08/20/2021   T = -2.2   Osteopenia  Past Surgical History:  Procedure  Date   BLADDER SUSPENSION  03/2006, 07/2006, 01/2007   for stress incontinence   CHOLECYSTECTOMY N/A 12/08/2018   LAP  CHOLECYSTECTOMY; Coralie Keens, MD;    COLONOSCOPY  05/2005   Normal, recheck 10 yrs   LUMBAR FUSION Right 09/2016   L4-5 fusion by Dr. Sherley Bounds   TONSILLECTOMY AND ADENOIDECTOMY     TOTAL KNEE ARTHROPLASTY Left 2012   x 2 left knee   TOTAL KNEE REVISION  2014   TOTAL KNEE REVISION Left 08/2018   Dr Tracie Harrier, [facility]     Family History  Problem Relation Age of Onset   Hypertension Father    Diabetes Father    Parkinson's disease Father    Dementia Mother    Heart attack Paternal Grandfather 56       Died MI     Social History   Tobacco Use   Smoking status: Every Day    Packs/day: 0.75    Years: 47.00    Pack years: 35.25    Types: Cigarettes    Start date: 03/10/1971   Smokeless tobacco: Never   Tobacco comments:    1 pack/day from age [age], now down to 0.5  Vaping Use   Vaping Use: Never used  Substance Use Topics   Alcohol use: No   Drug use: No      ROS Constitutional: Denies fever, chills, weight loss/gain, headaches, insomnia,  night sweats, and change in appetite. Does c/o fatigue. Eyes: Denies redness, blurred vision, diplopia, discharge, itchy, watery eyes.  ENT: Denies discharge, congestion, post nasal drip, epistaxis, sore  throat, earache, hearing loss, dental pain, Tinnitus, Vertigo, Sinus pain, snoring.  Cardio: Denies chest pain, palpitations, irregular heartbeat, syncope, dyspnea, diaphoresis, orthopnea, PND, claudication, edema Respiratory: denies cough, dyspnea, DOE, pleurisy, hoarseness, laryngitis, wheezing.  Gastrointestinal: Denies dysphagia, heartburn, reflux, water brash, pain, cramps, nausea, vomiting, bloating, diarrhea, constipation, hematemesis, melena, hematochezia, jaundice, hemorrhoids Genitourinary: Denies dysuria, frequency, urgency, nocturia, hesitancy, discharge, hematuria, flank pain Breast: Breast lumps, nipple discharge, bleeding.  Musculoskeletal: Denies arthralgia, myalgia, stiffness, Jt. Swelling, pain, limp, and strain/sprain. Denies falls. Skin: Denies puritis, rash, hives, warts, acne, eczema, changing in skin lesion Neuro: No weakness, tremor, incoordination, spasms, paresthesia, pain Psychiatric: Denies confusion, memory loss, sensory loss. Denies Depression. Endocrine: Denies change in weight, skin, hair change, nocturia, and paresthesia, diabetic polys, visual blurring, hyper / hypo glycemic episodes.  Heme/Lymph: No excessive bleeding, bruising, enlarged lymph nodes.  Physical Exam  BP 118/84   Pulse 91   Temp 97.9 F (36.6 C)   Resp 18   Ht '5\' 5"'$  (1.651 m)   Wt 180 lb 9.6 oz (81.9 kg)   LMP 04/09/2009   SpO2 98%   BMI 30.05 kg/m   General Appearance: Well nourished, well groomed and in no apparent distress.  Eyes: PERRLA, EOMs, conjunctiva no swelling or erythema, normal fundi and vessels. Sinuses: No frontal/maxillary tenderness ENT/Mouth: EACs patent / TMs  nl. Nares  clear without erythema, swelling, mucoid exudates. Oral hygiene is good. No erythema, swelling, or exudate. Tongue normal, non-obstructing. Tonsils not swollen or erythematous. Hearing normal.  Neck: Supple, thyroid not palpable. No bruits, nodes or JVD. Respiratory: Respiratory effort normal.  BS  equal and clear bilateral without rales, rhonci, wheezing or stridor. Cardio: Heart sounds are normal with regular rate and rhythm and no murmurs, rubs or gallops. Peripheral pulses are normal and equal bilaterally without edema. No aortic or femoral bruits. Chest: symmetric with normal excursions and percussion. Breasts: Symmetric, without lumps, nipple discharge, retractions, or fibrocystic changes.  Abdomen: Flat, soft with bowel sounds active. Nontender, no guarding, rebound, hernias, masses, or organomegaly.  Lymphatics: Non tender without lymphadenopathy.  Genitourinary:  Musculoskeletal: Full ROM all peripheral extremities, joint stability, 5/5 strength, and normal gait. Skin: Warm and dry without rashes, lesions, cyanosis, clubbing or  ecchymosis. (+) chalky thickened dystrophic Lt  1st toenail.  Neuro: Cranial nerves intact, reflexes equal bilaterally. Normal muscle tone, no cerebellar symptoms. Sensation intact.  Pysch: Alert and oriented X 3, normal affect, Insight and Judgment appropriate.    Assessment and Plan  1. Annual Preventative Screening Examination   2. Morbid obesity ([place]) - BMI 30+ with OSA, T2DM, htn, hyperlipidemia   3. Essential hypertension  - EKG 12-Lead - Urinalysis, Routine w reflex microscopic - Microalbumin / creatinine urine ratio - CBC with Differential/Platelet - COMPLETE METABOLIC PANEL WITH GFR - Magnesium - TSH  4. Hyperlipidemia associated with type 2 diabetes mellitus (HCC)  - Lipid panel - TSH  5. Controlled type 2 diabetes mellitus with stage 2 chronic kidney  disease, without long-term current use of insulin (HCC)  - Urinalysis, Routine w reflex microscopic - Microalbumin / creatinine urine ratio - HM DIABETES FOOT EXAM - LOW EXTREMITY NEUR EXAM DOCUM - Hemoglobin A1c - Insulin, random  6. Vitamin D deficiency  - Lipid panel - VITAMIN D 25 Hydroxy (Vit-D Deficiency, Fractures)  7. Aortic atherosclerosis by CTscan Jan  2021  - EKG 12-Lead  8. OSA and COPD overlap syndrome ([place])   9. Depression, major, in remission ([place])  - TSH  10. Onychomycosis of toenail  - terbinafine (LAMISIL) 250 MG tablet;  Take 1 tablet Daily for Nail Fungus   Dispense: 90 tablet; Refill: 1  11. Screening for colorectal cancer  - POC Hemoccult Bld/Stl   12. Screening for ischemic heart disease  - EKG 12-Lead  13. FHx: heart disease  - EKG 12-Lead  14. Smoker  - EKG 12-Lead  15. Medication management  - CBC with Differential/Platelet - COMPLETE METABOLIC PANEL WITH GFR - Magnesium - Lipid panel - TSH - Hemoglobin A1c - Insulin, random - VITAMIN D 25 Hydroxy          Patient was counseled in prudent diet to achieve/maintain BMI less than 25 for weight control, BP monitoring, regular exercise and medications. Discussed med's effects and SE's. Screening labs and tests as requested with regular follow-up as recommended. Over 40 minutes of exam, counseling, chart review and high complex critical decision making was performed.   Kirtland Bouchard, MD

## 2021-11-19 NOTE — Patient Instructions (Signed)

## 2021-11-20 ENCOUNTER — Ambulatory Visit (INDEPENDENT_AMBULATORY_CARE_PROVIDER_SITE_OTHER): Payer: Medicare Other | Admitting: Internal Medicine

## 2021-11-20 ENCOUNTER — Encounter: Payer: Self-pay | Admitting: Internal Medicine

## 2021-11-20 DIAGNOSIS — Z136 Encounter for screening for cardiovascular disorders: Secondary | ICD-10-CM | POA: Diagnosis not present

## 2021-11-20 DIAGNOSIS — Z8249 Family history of ischemic heart disease and other diseases of the circulatory system: Secondary | ICD-10-CM | POA: Diagnosis not present

## 2021-11-20 DIAGNOSIS — F17200 Nicotine dependence, unspecified, uncomplicated: Secondary | ICD-10-CM

## 2021-11-20 DIAGNOSIS — I10 Essential (primary) hypertension: Secondary | ICD-10-CM

## 2021-11-20 DIAGNOSIS — Z0000 Encounter for general adult medical examination without abnormal findings: Secondary | ICD-10-CM

## 2021-11-20 DIAGNOSIS — I700 Atherosclerosis of aorta: Secondary | ICD-10-CM

## 2021-11-20 DIAGNOSIS — Z79899 Other long term (current) drug therapy: Secondary | ICD-10-CM

## 2021-11-20 DIAGNOSIS — G4733 Obstructive sleep apnea (adult) (pediatric): Secondary | ICD-10-CM

## 2021-11-20 DIAGNOSIS — E1122 Type 2 diabetes mellitus with diabetic chronic kidney disease: Secondary | ICD-10-CM

## 2021-11-20 DIAGNOSIS — Z86010 Personal history of colonic polyps: Secondary | ICD-10-CM

## 2021-11-20 DIAGNOSIS — F325 Major depressive disorder, single episode, in full remission: Secondary | ICD-10-CM

## 2021-11-20 DIAGNOSIS — E1169 Type 2 diabetes mellitus with other specified complication: Secondary | ICD-10-CM

## 2021-11-20 DIAGNOSIS — E559 Vitamin D deficiency, unspecified: Secondary | ICD-10-CM

## 2021-11-21 ENCOUNTER — Other Ambulatory Visit: Payer: Self-pay | Admitting: Internal Medicine

## 2021-11-21 DIAGNOSIS — R7989 Other specified abnormal findings of blood chemistry: Secondary | ICD-10-CM

## 2021-11-21 DIAGNOSIS — N3000 Acute cystitis without hematuria: Secondary | ICD-10-CM

## 2021-11-21 LAB — URINALYSIS, ROUTINE W REFLEX MICROSCOPIC
Bilirubin Urine: NEGATIVE
Glucose, UA: NEGATIVE
Hyaline Cast: NONE SEEN /LPF
Ketones, ur: NEGATIVE
Nitrite: POSITIVE — AB
Protein, ur: NEGATIVE
Specific Gravity, Urine: 1.032 (ref 1.001–1.035)
pH: 5.5 (ref 5.0–8.0)

## 2021-11-21 LAB — CBC WITH DIFFERENTIAL/PLATELET
Absolute Monocytes: 498 cells/uL (ref 200–950)
Basophils Absolute: 48 cells/uL (ref 0–200)
Basophils Relative: 0.8 %
Eosinophils Absolute: 306 cells/uL (ref 15–500)
Eosinophils Relative: 5.1 %
HCT: 40.2 % (ref 35.0–45.0)
Hemoglobin: 13.3 g/dL (ref 11.7–15.5)
Lymphs Abs: 1344 cells/uL (ref 850–3900)
MCH: 30.4 pg (ref 27.0–33.0)
MCHC: 33.1 g/dL (ref 32.0–36.0)
MCV: 91.8 fL (ref 80.0–100.0)
MPV: 11.7 fL (ref 7.5–12.5)
Monocytes Relative: 8.3 %
Neutro Abs: 3804 cells/uL (ref 1500–7800)
Neutrophils Relative %: 63.4 %
Platelets: 194 10*3/uL (ref 140–400)
RBC: 4.38 10*6/uL (ref 3.80–5.10)
RDW: 12.7 % (ref 11.0–15.0)
Total Lymphocyte: 22.4 %
WBC: 6 10*3/uL (ref 3.8–10.8)

## 2021-11-21 LAB — HEMOGLOBIN A1C
Hgb A1c MFr Bld: 5.3 % of total Hgb (ref <5.7)
Mean Plasma Glucose: 105 mg/dL
eAG (mmol/L): 5.8 mmol/L

## 2021-11-21 LAB — LIPID PANEL
Cholesterol: 129 mg/dL (ref <200)
HDL: 45 mg/dL — ABNORMAL LOW (ref >=50)
LDL Cholesterol (Calc): 70 mg/dL (calc)
Non-HDL Cholesterol (Calc): 84 mg/dL (calc) (ref <130)
Total CHOL/HDL Ratio: 2.9 (calc) (ref <5.0)
Triglycerides: 61 mg/dL (ref <150)

## 2021-11-21 LAB — COMPLETE METABOLIC PANEL WITH GFR
AG Ratio: 1.3 (calc) (ref 1.0–2.5)
ALT: 35 U/L — ABNORMAL HIGH (ref 6–29)
AST: 38 U/L — ABNORMAL HIGH (ref 10–35)
Albumin: 3.7 g/dL (ref 3.6–5.1)
Alkaline phosphatase (APISO): 218 U/L — ABNORMAL HIGH (ref 37–153)
BUN: 25 mg/dL (ref 7–25)
CO2: 27 mmol/L (ref 20–32)
Calcium: 9.1 mg/dL (ref 8.6–10.4)
Chloride: 106 mmol/L (ref 98–110)
Creat: 0.7 mg/dL (ref 0.50–1.05)
Globulin: 2.9 g/dL (calc) (ref 1.9–3.7)
Glucose, Bld: 117 mg/dL — ABNORMAL HIGH (ref 65–99)
Potassium: 3.8 mmol/L (ref 3.5–5.3)
Sodium: 140 mmol/L (ref 135–146)
Total Bilirubin: 0.3 mg/dL (ref 0.2–1.2)
Total Protein: 6.6 g/dL (ref 6.1–8.1)
eGFR: 94 mL/min/{1.73_m2} (ref >=60)

## 2021-11-21 LAB — MICROALBUMIN / CREATININE URINE RATIO
Creatinine, Urine: 130 mg/dL (ref 20–275)
Microalb Creat Ratio: 2 mcg/mg creat (ref <30)
Microalb, Ur: 0.2 mg/dL

## 2021-11-21 LAB — MAGNESIUM: Magnesium: 2 mg/dL (ref 1.5–2.5)

## 2021-11-21 LAB — INSULIN, RANDOM: Insulin: 31.6 u[IU]/mL — ABNORMAL HIGH

## 2021-11-21 LAB — VITAMIN D 25 HYDROXY (VIT D DEFICIENCY, FRACTURES): Vit D, 25-Hydroxy: 70 ng/mL (ref 30–100)

## 2021-11-21 LAB — TSH: TSH: 2.83 mIU/L (ref 0.40–4.50)

## 2021-11-21 MED ORDER — NITROFURANTOIN MONOHYD MACRO 100 MG PO CAPS: ORAL_CAPSULE | ORAL | 0 refills | Status: DC

## 2021-11-21 NOTE — Progress Notes (Signed)
<><><><><><><><><><><><><><><><><><><><><><><><><><><><><><><><><> <><><><><><><><><><><><><><><><><><><><><><><><><><><><><><><><><> - Test results slightly outside the reference range are not unusual. If there is anything important, I will review this with you,  otherwise it is considered normal test values.  If you have further questions,  please do not hesitate to contact me at the office or via My Chart.  <><><><><><><><><><><><><><><><><><><><><><><><><><><><><><><><><> <><><><><><><><><><><><><><><><><><><><><><><><><><><><><><><><><>  -  U/A shows a UTI , So sent in Rx for Macrobid  x 1 week   - Please call office to schedule a follow-uip Nurse visit to recheck U/A & Culture in about a month. <><><><><><><><><><><><><><><><><><><><><><><><><><><><><><><><><> <><><><><><><><><><><><><><><><><><><><><><><><><><><><><><><><><>  - Liver enzymes are also a little elevated again,                                                                   So will also recheck at 1 month Nurse Visit. <><><><><><><><><><><><><><><><><><><><><><><><><><><><><><><><><> <><><><><><><><><><><><><><><><><><><><><><><><><><><><><><><><><>  - Previous Ultrasound showed a fatty liver which tragically over time will                                                                                                             lead to problems   - So the treatment is  - weight loss                                 Fatty liver (Hepatic Steatosis) or                                                            Nonalcoholic fatty liver disease (NASH) is                                                    now the leading cause of liver failure in the [place].   It is normally from such risk factors as   obesity,  diabetes, insulin resistance, high cholesterol, or metabolic syndrome.   The only definitive therapy is  weight loss and exercise.   Suggest walking 20-30 mins daily.  Decreasing  carbohydrates, increasing veggies.  Liver cancer has been noted in patient with fatty liver without cirrhosis.   Fatty Liver                Fatty liver is the accumulation of fat in liver cells. It is also called hepatosteatosis or steatohepatitis. It is normal for your liver to contain some fat. If fat is more than 5 to 10% of your liver's  weight, you have fatty liver.                There are often no symptoms (problems) for years while damage is still occurring. People often learn about their fatty liver when they have medical tests for other reasons. Fat can damage your liver for years or even decades without causing problems. When it becomes severe, it can cause fatigue, weight loss, weakness, and confusion.                    This makes you more likely to develop more serious liver problems. The liver is the largest organ in the body. It does a lot of work and often gives no warning signs when it is sick until late in a disease.   The liver has many important jobs including:   Breaking down foods.  Storing vitamins, iron, and other minerals.  Making proteins.  Making bile for food digestion.  Breaking down many products including medications, alcohol and some poisons.   PROGNOSIS                             Fatty liver may cause no damage or                                                                         it can lead to an inflammation of the liver.                                                                                                                    This is, called steatohepatitis.   Over time the liver may become scarred and hardened.  This condition is called cirrhosis.                          Cirrhosis is serious and may lead to liver failure or cancer.  NASH is one of the leading causes of cirrhosis.  About 10-20% of Americans have fatty liver and a smaller 2-5% has NASH.   TREATMENT                          Weight loss, fat restriction and  exercise In overweight patients  produces inconsistent results   but is worth trying.   Good control of diabetes may reduce fatty liver.   Eat a balanced, healthy diet.   Increase your physical activity.                                   There are no medical or  surgical treatments for a fatty liver or NASH,  but improving your diet and increasing your exercise may help prevent or  reverse some of the damage.  <><><><><><><><><><><><><><><><><><><><><><><><><><><><><><><><><> <><><><><><><><><><><><><><><><><><><><><><><><><><><><><><><><><>  -  -  Total  Chol =    129 - Excellent            (  Ideal  or  Goal is less than 180  !  )  & -  Bad / Dangerous LDL  Chol =   70 - also Excellent             (  Ideal  or  Goal is less than 70  !  )    Excellent - Very low risk for Heart Attack  / Stroke  <><><><><><><><><><><><><><><><><><><><><><><><><><><><><><><><><> <><><><><><><><><><><><><><><><><><><><><><><><><><><><><><><><><>  - A1c - Normal - No Diabetes  - Great   ! <><><><><><><><><><><><><><><><><><><><><><><><><><><><><><><><><> <><><><><><><><><><><><><><><><><><><><><><><><><><><><><><><><><>  - Vitamin D = 70  - Excellent     -              Please continue dose same  <><><><><><><><><><><><><><><><><><><><><><><><><><><><><><><><><> <><><><><><><><><><><><><><><><><><><><><><><><><><><><><><><><><>  - All Else - CBC - Kidneys - Electrolytes - Liver - Magnesium & Thyroid    - all  Normal / OK  <><><><><><><><><><><><><><><><><><><><><><><><><><><><><><><><><> <><><><><><><><><><><><><><><><><><><><><><><><><><><><><><><><><>

## 2021-11-28 LAB — HM MAMMOGRAPHY

## 2021-12-04 ENCOUNTER — Telehealth: Payer: Self-pay | Admitting: Gastroenterology

## 2021-12-04 NOTE — Telephone Encounter (Signed)
I called patient to schedule, no response. Left a detailed message for patient to call back ans schedule.

## 2021-12-04 NOTE — Telephone Encounter (Signed)
Supervising MD 11/22/2021 PM   Hi Dr. Silverio Decamp,    We received a referral for this patient for History of adenomatous polyp of colon. Patient had GI history with Dr Benson [place] on 06/27/2021. States she had a bad experience with him reason for the transfer over to [place]. Records are in Epic for you to review. Please advise on scheduling.     Thank you

## 2021-12-04 NOTE — Telephone Encounter (Signed)
Please schedule pt for direct colon in [place], see note below.

## 2021-12-04 NOTE — Telephone Encounter (Signed)
Patient is due for surveillance colonoscopy with h/o advanced adenomatous polyps removed piecemeal. Please schedule direct colonoscopy at [facility] next available appointment if she currently doesn't have any GI symptoms. Thanks

## 2021-12-09 ENCOUNTER — Encounter: Payer: Self-pay | Admitting: Gastroenterology

## 2021-12-09 NOTE — Telephone Encounter (Signed)
Called patient to schedule direct colon [facility], states she would like an OV first with Dr. Silverio Decamp first before proceeding on scheduling colon. Patient is schedule for 12/13.   Thank you

## 2021-12-26 ENCOUNTER — Ambulatory Visit (HOSPITAL_COMMUNITY): Admit: 2021-12-26 | Payer: Medicare Other | Admitting: Gastroenterology

## 2021-12-26 ENCOUNTER — Encounter (HOSPITAL_COMMUNITY): Payer: Self-pay

## 2021-12-26 SURGERY — COLONOSCOPY WITH PROPOFOL
Anesthesia: Monitor Anesthesia Care

## 2021-12-31 ENCOUNTER — Other Ambulatory Visit: Payer: Self-pay | Admitting: Internal Medicine

## 2021-12-31 DIAGNOSIS — B351 Tinea unguium: Secondary | ICD-10-CM

## 2022-01-05 ENCOUNTER — Other Ambulatory Visit (HOSPITAL_COMMUNITY)
Admission: RE | Admit: 2022-01-05 | Discharge: 2022-01-05 | Disposition: A | Discharge status: Home / Self Care | Payer: Medicare Other | Source: Ambulatory Visit | Attending: Obstetrics & Gynecology | Admitting: Obstetrics & Gynecology

## 2022-01-05 ENCOUNTER — Encounter (HOSPITAL_BASED_OUTPATIENT_CLINIC_OR_DEPARTMENT_OTHER): Payer: Self-pay | Admitting: Obstetrics & Gynecology

## 2022-01-05 ENCOUNTER — Ambulatory Visit (INDEPENDENT_AMBULATORY_CARE_PROVIDER_SITE_OTHER): Payer: Medicare Other | Admitting: Obstetrics & Gynecology

## 2022-01-05 VITALS — BP 121/95 | HR 82 | Ht 66.0 in | Wt 176.8 lb

## 2022-01-05 DIAGNOSIS — Z780 Asymptomatic menopausal state: Secondary | ICD-10-CM

## 2022-01-05 DIAGNOSIS — B372 Candidiasis of skin and nail: Secondary | ICD-10-CM

## 2022-01-05 DIAGNOSIS — M8580 Other specified disorders of bone density and structure, unspecified site: Secondary | ICD-10-CM

## 2022-01-05 DIAGNOSIS — Z124 Encounter for screening for malignant neoplasm of cervix: Secondary | ICD-10-CM | POA: Diagnosis present

## 2022-01-05 DIAGNOSIS — Z01411 Encounter for gynecological examination (general) (routine) with abnormal findings: Secondary | ICD-10-CM | POA: Diagnosis not present

## 2022-01-05 DIAGNOSIS — L900 Lichen sclerosus et atrophicus: Secondary | ICD-10-CM

## 2022-01-05 MED ORDER — MOMETASONE FUROATE 0.1 % EX OINT: TOPICAL_OINTMENT | CUTANEOUS | 2 refills | Status: DC

## 2022-01-05 MED ORDER — NYSTATIN 100000 UNIT/GM EX CREA: 1.0000 | TOPICAL_CREAM | Freq: Two times a day (BID) | CUTANEOUS | 0 refills | Status: DC

## 2022-01-05 NOTE — Progress Notes (Signed)
71 y.o. G65P1021 Divorced White or Caucasian female here for breast and pelvic exam.  I am also following her for h/o lichen sclerosus.  Denies vaginal bleeding.  Denies vaginal bleeding.  Patient's last menstrual period was 04/09/2009.          Sexually active: No.  H/O STD:  no  Health Maintenance: PCP:  Dr. Melford Aase.  Last wellness appt was this 11/2021.  Did blood work at that appt:  yes Vaccines are up to date:  has not done next covid update Colonoscopy:  06/2021.  Tubular adenomas.  Has follow up with Dr. Silverio Decamp next month. MMG:  09/2020.  Pt has done one this year. BMD:  pt reports had one done this summer.  Will call to see if can get result Last pap smear:  2021.   H/o abnormal pap smear:  no    reports that she has been smoking cigarettes. She started smoking about 50 years ago. She has a 35.25 pack-year smoking history. She has never used smokeless tobacco. She reports that she does not drink alcohol and does not use drugs.  Past Medical History:  Diagnosis Date   Abscessed tooth    Anemia    Anxiety    Arthritis    Bell's palsy 01/1985   COPD (chronic obstructive pulmonary disease) (HCC)    Depression    Diabetes mellitus without complication ([place])    Fibroid uterus 12/20/2014   7cm, 3.4cm and 1.9cm fibroids    GERD (gastroesophageal reflux disease)    Hypertension    IBS (irritable bowel syndrome) 02/1988   Obesity hypoventilation syndrome ([place]) 11/19/2014   Obesity, Class III, BMI 40-49.9 (morbid obesity) ([place])    OSA (obstructive sleep apnea)    no cpap   Vitamin D deficiency     Past Surgical History:  Procedure Laterality Date   BLADDER SUSPENSION  03/2006, 07/2006, 01/2007   for stress incontinence   CHOLECYSTECTOMY N/A 12/08/2018   Procedure: LAPAROSCOPIC CHOLECYSTECTOMY;  Surgeon: Coralie Keens, MD;  Location: [facility];  Service: General;  Laterality: N/A;   COLONOSCOPY  05/2005   Normal, recheck 10 yrs   COLONOSCOPY WITH PROPOFOL N/A 06/27/2021    Procedure: COLONOSCOPY WITH PROPOFOL;  Surgeon: Carol Ada, MD;  Location: [facility];  Service: Endoscopy;  Laterality: N/A;   HEMOSTASIS CLIP PLACEMENT  06/27/2021   Procedure: HEMOSTASIS CLIP PLACEMENT;  Surgeon: Carol Ada, MD;  Location: [facility];  Service: Endoscopy;;   LUMBAR FUSION Right 09/2016   L4-5 fusion by Dr. Sherley Bounds   POLYPECTOMY  06/27/2021   Procedure: POLYPECTOMY;  Surgeon: Carol Ada, MD;  Location: [facility];  Service: Endoscopy;;   TONSILLECTOMY AND ADENOIDECTOMY     TOTAL KNEE ARTHROPLASTY Left 2012   x 2 left knee   TOTAL KNEE REVISION  2014   TOTAL KNEE REVISION Left 08/2018   Dr Tracie Harrier, [facility]    Current Outpatient Medications  Medication Sig Dispense Refill   ALPRAZolam (XANAX) 0.25 MG tablet Take 1/2 to 1 tablet 2 x / day if needed for anxiety. (Patient taking differently: Take 0.25 mg by mouth 2 (two) times daily.) 60 tablet 0   b complex vitamins capsule Take 1 capsule by mouth daily.     BIOTIN PO Take 1 tablet by mouth daily.     Cholecalciferol (VITAMIN D) 50 MCG (2000 UT) CAPS Take 2,000 Units by mouth daily.     COPPER PO Take 1 tablet by mouth daily.     famotidine (PEPCID)  10 MG tablet Take 10 mg by mouth at bedtime as needed for heartburn.     glucose blood (ACCU-CHEK AVIVA PLUS) test strip      LINZESS 145 MCG CAPS capsule Take 145 mcg by mouth daily with supper.     Magnesium 500 MG TABS Take 500 mg by mouth daily.     metoCLOPramide (REGLAN) 10 MG tablet Take  1 tablet  4 x /day  before Meals & Bedtime for Acid Reflux / Patient knows to take by mouth (Patient taking differently: Take 10 mg by mouth at bedtime.) 360 tablet 3   milk thistle 175 MG tablet Take 175 mg by mouth daily.     nitrofurantoin, macrocrystal-monohydrate, (MACROBID) 100 MG capsule Take  1 capsule 2 x /day with Food for UTI 14 capsule 0   Omega-3 Fatty Acids (FISH OIL OMEGA-3 PO) Take 1 capsule by mouth daily.     ONETOUCH DELICA LANCETS 62U MISC  Check blood sugar 1 time daily-DX-E11.22 100 each 4   ONETOUCH VERIO test strip USE TO CHECK BLOOD SUGAR ONCE DAILY 100 each 3   rosuvastatin (CRESTOR) 10 MG tablet TAKE 1 TABLET BY MOUTH DAILY FOR CHOLESTEROL 90 tablet 3   terbinafine (LAMISIL) 250 MG tablet TAKE 1 TABLET BY MOUTH EVERY DAY FOR ONE MONTH ON AND 1 MONTH OFF FOR NAIL FUNGUS. SKIP EVERY OTHER MONTH. 90 tablet 0   venlafaxine XR (EFFEXOR-XR) 150 MG 24 hr capsule Take 150 mg by mouth 2 (two) times daily.      vitamin C (ASCORBIC ACID) 500 MG tablet Take 500 mg by mouth daily.     vitamin E 180 MG (400 UNITS) capsule Take 400 Units by mouth daily.     zaleplon (SONATA) 10 MG capsule Take 10 mg by mouth at bedtime as needed for sleep.     zinc gluconate 50 MG tablet Take 50 mg by mouth daily.     No current facility-administered medications for this visit.    Family History  Problem Relation Age of Onset   Hypertension Father    Diabetes Father    Parkinson's disease Father    Dementia Mother    Heart attack Paternal Grandfather 69       Died MI    Review of Systems  Constitutional: Negative.   Genitourinary: Negative.     Exam:   BP (!) 121/95 (BP Location: Right Arm, Patient Position: Sitting, Cuff Size: Large)   Pulse 82   Ht '5\' 6"'$  (1.676 m) Comment: Reported  Wt 176 lb 12.8 oz (80.2 kg)   LMP 04/09/2009   BMI 28.54 kg/m   Height: '5\' 6"'$  (167.6 cm) (Reported)  General appearance: alert, cooperative and appears stated age Breasts: normal appearance, no masses or tenderness Abdomen: soft, non-tender; bowel sounds normal; no masses,  no organomegaly Lymph nodes: Cervical, supraclavicular, and axillary nodes normal.  No abnormal inguinal nodes palpated Neurologic: Grossly normal  Pelvic: External genitalia:  small area of hypopigmentation at upper left labia minora and near clitoris               Urethra:  normal appearing urethra with no masses, tenderness or lesions              Bartholins and Skenes: normal                  Vagina: normal appearing vagina with atrophic changes and no discharge, no lesions  Cervix: no lesions              Pap taken: Yes.   Bimanual Exam:  Uterus:  normal size, contour, position, consistency, mobility, non-tender              Adnexa: normal adnexa and no mass, fullness, tenderness               Rectovaginal: Confirms               Anus:  normal sphincter tone, no lesions  Chaperone, Octaviano Batty, CMA, was present for exam.  Assessment/Plan: 1. Encntr for gyn exam (general) (routine) w abnormal findings - Pap smear obtained today - Mammogram copy will be obtained - Colonoscopy done 06/2021.  Has consult with Dr. Silverio Decamp next month about when follow up needed - Bone mineral density done this year - lab work done with PCP - vaccines reviewed/updated  2. Cervical cancer screening - Cytology - PAP( [place]) - PR OBTAINING SCREEN PAP SMEAR  3. Lichen sclerosus - will add mometasone 0.1%ointment two to three times weekly for maintenance  4. Postmenopausal - no HRT  5. Osteopenia, unspecified location - will get copy of one done this summer  6.  Skin candida - rx for nystatin sent to pharmacy to use bid for up to 7 days.

## 2022-01-06 LAB — CYTOLOGY - PAP: Diagnosis: NEGATIVE

## 2022-01-12 ENCOUNTER — Encounter (HOSPITAL_BASED_OUTPATIENT_CLINIC_OR_DEPARTMENT_OTHER): Payer: Self-pay | Admitting: *Deleted

## 2022-01-26 ENCOUNTER — Ambulatory Visit (INDEPENDENT_AMBULATORY_CARE_PROVIDER_SITE_OTHER): Payer: Medicare Other | Admitting: Nurse Practitioner

## 2022-01-26 ENCOUNTER — Other Ambulatory Visit: Payer: Self-pay

## 2022-01-26 VITALS — HR 86 | Temp 96.9°F

## 2022-01-26 DIAGNOSIS — Z1152 Encounter for screening for COVID-19: Secondary | ICD-10-CM | POA: Diagnosis not present

## 2022-01-26 LAB — POC COVID19 BINAXNOW: SARS Coronavirus 2 Ag: POSITIVE — AB

## 2022-01-26 MED ORDER — MOLNUPIRAVIR EUA 200MG CAPSULE: 4.0000 | ORAL_CAPSULE | Freq: Two times a day (BID) | ORAL | 0 refills | Status: AC

## 2022-01-26 NOTE — Progress Notes (Signed)
THIS ENCOUNTER IS A VIRTUAL VISIT DUE TO COVID-19 - PATIENT WAS NOT SEEN IN THE OFFICE.  PATIENT HAS CONSENTED TO VIRTUAL VISIT / TELEMEDICINE VISIT   Virtual Visit via telephone Note  I connected with  Beth [name] on 01/26/2022 by telephone.  I verified that I am speaking with the correct person using two identifiers.    I discussed the limitations of evaluation and management by telemedicine and the availability of in person appointments. The patient expressed understanding and agreed to proceed.  History of Present Illness:  Pulse 86   Temp (!) 96.9 F (36.1 C)   LMP 04/09/2009   SpO2 95%  71 y.o. patient contacted office reporting URI sx body aches, fatigue, weakness, runny nose, congestion. . she tested positive by home test kit. OV was conducted by telephone to minimize exposure. This patient has been vaccinated for covid 19, 01/2020.  Sx began 2 days ago with sore throat and runny  nose.    Treatments tried so far: Nothing  Exposures: Last Sunday   Medications   Current Outpatient Medications (Cardiovascular):    rosuvastatin (CRESTOR) 10 MG tablet, TAKE 1 TABLET BY MOUTH DAILY FOR CHOLESTEROL     Current Outpatient Medications (Other):    ALPRAZolam (XANAX) 0.25 MG tablet, Take 1/2 to 1 tablet 2 x / day if needed for anxiety. (Patient taking differently: Take 0.25 mg by mouth 2 (two) times daily.)   b complex vitamins capsule, Take 1 capsule by mouth daily.   BIOTIN PO, Take 1 tablet by mouth daily.   Cholecalciferol (VITAMIN D) 50 MCG (2000 UT) CAPS, Take 2,000 Units by mouth daily.   famotidine (PEPCID) 10 MG tablet, Take 10 mg by mouth at bedtime as needed for heartburn.   glucose blood (ACCU-CHEK AVIVA PLUS) test strip,    LINZESS 145 MCG CAPS capsule, Take 145 mcg by mouth daily with supper.   Magnesium 500 MG TABS, Take 500 mg by mouth daily.   metoCLOPramide (REGLAN) 10 MG tablet, Take  1 tablet  4 x /day  before Meals & Bedtime for Acid Reflux / Patient  knows to take by mouth (Patient taking differently: Take 10 mg by mouth at bedtime.)   milk thistle 175 MG tablet, Take 175 mg by mouth daily.   mometasone (ELOCON) 0.1 % ointment, Apply topically two to three times weekly.   Omega-3 Fatty Acids (FISH OIL OMEGA-3 PO), Take 1 capsule by mouth daily.   ONETOUCH DELICA LANCETS 69G MISC, Check blood sugar 1 time daily-DX-E11.22   ONETOUCH VERIO test strip, USE TO CHECK BLOOD SUGAR ONCE DAILY   terbinafine (LAMISIL) 250 MG tablet, TAKE 1 TABLET BY MOUTH EVERY DAY FOR ONE MONTH ON AND 1 MONTH OFF FOR NAIL FUNGUS. SKIP EVERY OTHER MONTH.   venlafaxine XR (EFFEXOR-XR) 150 MG 24 hr capsule, Take 150 mg by mouth 2 (two) times daily.    vitamin C (ASCORBIC ACID) 500 MG tablet, Take 500 mg by mouth daily.   vitamin E 180 MG (400 UNITS) capsule, Take 400 Units by mouth daily.   zaleplon (SONATA) 10 MG capsule, Take 10 mg by mouth at bedtime as needed for sleep.   zinc gluconate 50 MG tablet, Take 50 mg by mouth daily.   COPPER PO, Take 1 tablet by mouth daily. (Patient not taking: Reported on 01/26/2022)   nitrofurantoin, macrocrystal-monohydrate, (MACROBID) 100 MG capsule, Take  1 capsule 2 x /day with Food for UTI (Patient not taking: Reported on 01/26/2022)   nystatin  cream (MYCOSTATIN), Apply 1 Application topically 2 (two) times daily. Apply to affected area BID for up to 7 days. 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  ([place]);  Depression, major, in remission ([place]); IBS (irritable bowel syndrome); Bell's palsy; Vitamin D deficiency; Medication management; OSA and COPD overlap syndrome ([place]); GERD ; COPD (chronic obstructive pulmonary disease) ([place]); Iron deficiency anemia; Smoker; Seasonal allergies; Osteopenia; FHx: heart disease; Hyperlipidemia associated with type 2 diabetes mellitus ([place]); S/P laparoscopic cholecystectomy; Overweight (BMI 25.0-29.9); Aortic atherosclerosis by CTscan Jan 2021; CKD stage 2 due to type 2 diabetes mellitus ([place]); Hepatic steatosis; OAB (overactive bladder); Lichen sclerosus; History of adenomatous polyp of colon; and Right rotator cuff tear on their problem list.   Social History:   reports that she has been smoking cigarettes. She started smoking about 50 years ago. She has a 35.25 pack-year smoking history. She has never used smokeless tobacco. She reports that she does not drink alcohol and does not use drugs.  Observations/Objective:  General : Well sounding patient in no apparent distress HEENT: no hoarseness, no cough for duration of visit Lungs: speaks in complete sentences, no audible wheezing, no apparent distress Neurological: alert, oriented x 3 Psychiatric: pleasant, judgement appropriate   Assessment and Plan:  Covid 19 Covid 19 positive per rapid screening test in home Risk factors include: HTN, COPD, DM, CKD, HLD Symptoms are: mild Due to co morbid conditions and risk factors, discussed antivirals Molnupiravir Immue support reviewed including continuation of zinc, vitamin c and vitamin d Take tylenol PRN temp 101+ Push hydration Regular ambulation or calf exercises exercises for clot prevention and 81 mg ASA unless contraindicated Sx supportive therapy suggested Follow up via mychart or telephone if needed Advised patient obtain O2 monitor; present to ED if persistently <90% or with severe dyspnea, CP, fever uncontrolled by tylenol, confusion, sudden decline Should  remain in isolation until at least 5 days from onset of sx, 24-48 hours fever free without tylenol, sx such as cough are improved.   1. Encounter for screening for COVID-19 POSITIVE  - POC COVID-19 - molnupiravir EUA (LAGEVRIO) 200 mg CAPS capsule; Take 4 capsules (800 mg total) by mouth 2 (two) times daily for 5 days.  Dispense: 40 capsule; Refill: 0     Follow Up Instructions:  I discussed the assessment and treatment plan with the patient. The patient was provided an opportunity to ask questions and all were answered. The patient agreed with the plan and demonstrated an understanding of the instructions.   The patient was advised to call back or seek an in-person evaluation if the symptoms worsen or if the condition fails to improve as anticipated.  I provided 15 minutes of non-face-to-face time during this encounter.   Darrol Jump, NP

## 2022-02-02 ENCOUNTER — Ambulatory Visit (INDEPENDENT_AMBULATORY_CARE_PROVIDER_SITE_OTHER): Payer: Medicare Other | Admitting: Nurse Practitioner

## 2022-02-02 ENCOUNTER — Encounter: Payer: Self-pay | Admitting: Nurse Practitioner

## 2022-02-02 VITALS — BP 118/74 | HR 87 | Temp 97.5°F | Ht 65.0 in | Wt 172.2 lb

## 2022-02-02 DIAGNOSIS — J439 Emphysema, unspecified: Secondary | ICD-10-CM

## 2022-02-02 DIAGNOSIS — Z0001 Encounter for general adult medical examination with abnormal findings: Secondary | ICD-10-CM | POA: Diagnosis not present

## 2022-02-02 DIAGNOSIS — E559 Vitamin D deficiency, unspecified: Secondary | ICD-10-CM

## 2022-02-02 DIAGNOSIS — I700 Atherosclerosis of aorta: Secondary | ICD-10-CM | POA: Diagnosis not present

## 2022-02-02 DIAGNOSIS — R6889 Other general symptoms and signs: Secondary | ICD-10-CM

## 2022-02-02 DIAGNOSIS — M8580 Other specified disorders of bone density and structure, unspecified site: Secondary | ICD-10-CM

## 2022-02-02 DIAGNOSIS — J449 Chronic obstructive pulmonary disease, unspecified: Secondary | ICD-10-CM

## 2022-02-02 DIAGNOSIS — K589 Irritable bowel syndrome without diarrhea: Secondary | ICD-10-CM

## 2022-02-02 DIAGNOSIS — E1169 Type 2 diabetes mellitus with other specified complication: Secondary | ICD-10-CM

## 2022-02-02 DIAGNOSIS — F17200 Nicotine dependence, unspecified, uncomplicated: Secondary | ICD-10-CM

## 2022-02-02 DIAGNOSIS — R7989 Other specified abnormal findings of blood chemistry: Secondary | ICD-10-CM

## 2022-02-02 DIAGNOSIS — J302 Other seasonal allergic rhinitis: Secondary | ICD-10-CM

## 2022-02-02 DIAGNOSIS — K219 Gastro-esophageal reflux disease without esophagitis: Secondary | ICD-10-CM

## 2022-02-02 DIAGNOSIS — F325 Major depressive disorder, single episode, in full remission: Secondary | ICD-10-CM

## 2022-02-02 DIAGNOSIS — E785 Hyperlipidemia, unspecified: Secondary | ICD-10-CM

## 2022-02-02 DIAGNOSIS — I10 Essential (primary) hypertension: Secondary | ICD-10-CM

## 2022-02-02 DIAGNOSIS — N182 Chronic kidney disease, stage 2 (mild): Secondary | ICD-10-CM

## 2022-02-02 DIAGNOSIS — G4733 Obstructive sleep apnea (adult) (pediatric): Secondary | ICD-10-CM | POA: Diagnosis not present

## 2022-02-02 DIAGNOSIS — Z0000 Encounter for general adult medical examination without abnormal findings: Secondary | ICD-10-CM

## 2022-02-02 DIAGNOSIS — Z86010 Personal history of colonic polyps: Secondary | ICD-10-CM

## 2022-02-02 NOTE — Progress Notes (Signed)
MEDICARE ANNUAL WELLNESS VISIT AND FOLLOW UP   Assessment:    Encounter for Medicare annual wellness exam 1 year  Controlled type 2 diabetes mellitus with chronic kidney disease, without long-term current use of insulin, unspecified CKD stage ([place]) Discussed general issues about diabetes pathophysiology and management., Educational material distributed., Suggested low cholesterol diet., Encouraged aerobic exercise., Discussed foot care., Reminded to get yearly retinal exam. - DM foot exam today - CMP, defer A1c normal 2 months ago  Aortic atherosclerosis ([place]) Control blood pressure, cholesterol, glucose, increase exercise.   OSA and COPD overlap syndrome (HCC) Sleep apnea- continue CPAP, CPAP is helping with daytime fatigue, weight loss still advised.   Obesity hypoventilation syndrome (HCC) Sleep apnea- continue CPAP, CPAP is helping with daytime fatigue, weight loss still advised.   Smoker/Chronic obstructive pulmonary disease, unspecified COPD type ([place]) No triggers, well controlled symptoms, cont to monitor D/C smoking Low dose Chest CT Lung cancer Screening ordered  Hyperlipidemia associated with type 2 diabetes mellitus ([place]) - CMP check lipids decrease fatty foods increase activity.   Morbid obesity ([place]) - BMI 30+ with OSA, T2DM, htn, hyperlipidemia - follow up 3 months for progress monitoring - increase veggies, decrease carbs - long discussion about weight loss, diet, and exercise  Depression, major, in remission ([place]) - continue medications, stress management techniques discussed, increase water, good sleep hygiene discussed, increase exercise, and increase veggies.   Essential hypertension -     CBC with Differential/Platelet -     COMPLETE METABOLIC PANEL WITH GFR - continue medications, DASH diet, exercise and monitor at home. Call if greater than 130/80.   Vitamin D deficiency Continue Vit D supplementation  Medication management -      Continued  Elevated LFT - Avoid Tylenol and alcohol - CMP  Irritable bowel syndrome, unspecified type/ polyp on colonoscopy If not on benefiber then add it, decrease stress,  if any worsening symptoms, blood in stool, AB pain, etc call office Appt scheduled with Dr. Silverio Decamp  Gastroesophageal reflux disease, unspecified whether esophagitis present Continue PPI/H2 blocker, diet discussed  Osteopenia of multiple sites Monitor  Seasonal allergies Allergy Continue OTC allergy pills   Over 40 minutes of exam, counseling, chart review and critical decision making was performed Future Appointments  Date Time Provider [place]  02/18/2022 10:10 AM Mauri Pole, MD [facility]  05/21/2022  3:30 PM [name], MD [facility] None  09/03/2022 11:30 AM Alycia Rossetti, NP [facility] None  11/25/2022  3:00 PM [name], MD [facility] None     Plan:   During the course of the visit the patient was educated and counseled about appropriate screening and preventive services including:   Pneumococcal vaccine  Prevnar 13 Influenza vaccine Td vaccine Screening electrocardiogram Bone densitometry screening Colorectal cancer screening Diabetes screening Glaucoma screening Nutrition counseling  Advanced directives: requested   Subjective:  Beth [name] is a 71 y.o. female who presents for Medicare Annual Wellness Visit and 3 month follow up.   She did have Covid 01/26/22- has some fatigue that remains and occasional runny nose of clear mucus.   Patient was seeing Dr Elta Guadeloupe Phillips/Pain Mgmt over 20+ years for Chronic Pain Syndrome consequent of Lumbar DDD, stopped after last surgery in Jul 2018.  She is using a walker due left knee pain, she had total knee revision in June 2020 with Dr. Tracie Harrier but states it was not successful, she has to wear a brace and use her walker still.   Did Cologuard 02/2021  which was positive.  Colonoscopy 06/27/21 with Dr.  Benson Norway 3 polyps. Was to have repeat colonoscopy 12/26/21- she has changed GI doctors and is going to meet with Dr. [facility] at [facility]  02/18/22. She got torn rotator cuff after her colonoscopy 06/2021. She has limited range of motion and is doing PT  Continues to have some ache in right shoulder.   She currently continues to smoke, now 1/2 pack a day; discussed risks associated with smoking, patient is ready to quit. Has declined medications thus far.  She has a 47 pack year smoking history.  Low dose CT lung cancer 04/26/20- repeat- had aortic atherosclerosis and COPD. She is due for repeat CT will scheduled  She has hx of major depression in remission on effexor; she is also prescribed xanax 0.25 mg BID PRN breakthrough anxiety. Has not had to take Sonata in months, sleeping well.   BMI is Body mass index is 28.66 kg/m., she is working on diet and exercise. She does use a walker for ambulation due to persistent left knee pain.  Wt Readings from Last 3 Encounters:  02/02/22 172 lb 3.2 oz (78.1 kg)  01/05/22 176 lb 12.8 oz (80.2 kg)  11/20/21 180 lb 9.6 oz (81.9 kg)    Her blood pressure has been controlled at home, today their BP is BP: 118/74 BP Readings from Last 3 Encounters:  02/02/22 118/74  01/05/22 (!) 121/95  11/20/21 118/84   She does not workout. She denies chest pain, shortness of breath, dizziness.   She is on cholesterol medication (crestor 62m, not at goal of 70) and denies myalgias. Her cholesterol is at goal. The cholesterol last visit was:   Lab Results  Component Value Date   CHOL 129 11/20/2021   HDL 45 (L) 11/20/2021   LDLCALC 70 11/20/2021   TRIG 61 11/20/2021   CHOLHDL 2.9 11/20/2021    She has not been working on diet and exercise for T2DM currently treated by lifestyle modification,, and denies foot ulcerations, increased appetite, nausea, polydipsia, polyuria and vomiting.  Last A1C in the office was:  Lab Results  Component Value Date   HGBA1C 5.3 11/20/2021    Continues to drink lots of water. Last GFR: Lab Results  Component Value Date   EGFR 94 11/20/2021    Patient is on Vitamin D supplement.   Lab Results  Component Value Date   VD25OH 70 11/20/2021      Medication Review:   Current Outpatient Medications (Cardiovascular):    rosuvastatin (CRESTOR) 10 MG tablet, TAKE 1 TABLET BY MOUTH DAILY FOR CHOLESTEROL     Current Outpatient Medications (Other):    ALPRAZolam (XANAX) 0.25 MG tablet, Take 1/2 to 1 tablet 2 x / day if needed for anxiety. (Patient taking differently: Take 0.25 mg by mouth 2 (two) times daily.)   b complex vitamins capsule, Take 1 capsule by mouth daily.   BIOTIN PO, Take 1 tablet by mouth daily.   Cholecalciferol (VITAMIN D) 50 MCG (2000 UT) CAPS, Take 2,000 Units by mouth daily.   famotidine (PEPCID) 10 MG tablet, Take 10 mg by mouth at bedtime as needed for heartburn.   glucose blood (ACCU-CHEK AVIVA PLUS) test strip,    LINZESS 145 MCG CAPS capsule, Take 145 mcg by mouth daily with supper.   Magnesium 500 MG TABS, Take 500 mg by mouth daily.   metoCLOPramide (REGLAN) 10 MG tablet, Take  1 tablet  4 x /day  before Meals & Bedtime for Acid Reflux /  Patient knows to take by mouth (Patient taking differently: Take 10 mg by mouth at bedtime.)   milk thistle 175 MG tablet, Take 175 mg by mouth daily.   mometasone (ELOCON) 0.1 % ointment, Apply topically two to three times weekly.   Omega-3 Fatty Acids (FISH OIL OMEGA-3 PO), Take 1 capsule by mouth daily.   ONETOUCH DELICA LANCETS 60F MISC, Check blood sugar 1 time daily-DX-E11.22   ONETOUCH VERIO test strip, USE TO CHECK BLOOD SUGAR ONCE DAILY   terbinafine (LAMISIL) 250 MG tablet, TAKE 1 TABLET BY MOUTH EVERY DAY FOR ONE MONTH ON AND 1 MONTH OFF FOR NAIL FUNGUS. SKIP EVERY OTHER MONTH.   venlafaxine XR (EFFEXOR-XR) 150 MG 24 hr capsule, Take 150 mg by mouth 2 (two) times daily.    vitamin C (ASCORBIC ACID) 500 MG tablet, Take 500 mg by mouth daily.   vitamin E  180 MG (400 UNITS) capsule, Take 400 Units by mouth daily.   zaleplon (SONATA) 10 MG capsule, Take 10 mg by mouth at bedtime as needed for sleep.   zinc gluconate 50 MG tablet, Take 50 mg by mouth daily.   COPPER PO, Take 1 tablet by mouth daily. (Patient not taking: Reported on 01/26/2022)   nitrofurantoin, macrocrystal-monohydrate, (MACROBID) 100 MG capsule, Take  1 capsule 2 x /day with Food for UTI (Patient not taking: Reported on 01/26/2022)   nystatin cream (MYCOSTATIN), Apply 1 Application topically 2 (two) times daily. Apply to affected area BID for up to 7 days. (Patient not taking: Reported on 01/26/2022)   Allergies  Allergen Reactions   Penicillins Anaphylaxis and Rash        Sulfa Antibiotics Shortness Of Breath and Rash    Also light headed   Hydrocodone Nausea And Vomiting   Celebrex [Celecoxib] Other (See Comments)    Elevates Blood Pressure    Ciprofloxacin Nausea And Vomiting   Codeine Nausea And Vomiting   Levaquin [Levofloxacin] Nausea And Vomiting   Motrin Ib [Ibuprofen] Hives   Prempro [Conj Estrog-Medroxyprogest Ace] Hives    Current Problems (verified) Patient Active Problem List   Diagnosis Date Noted   Right rotator cuff tear 09/06/2021   History of adenomatous polyp of colon 09/32/3557   Lichen sclerosus 32/20/2542   Hepatic steatosis 02/07/2020   OAB (overactive bladder) 02/07/2020   CKD stage 2 due to type 2 diabetes mellitus ([place]) 02/06/2020   Aortic atherosclerosis by CTscan Jan 2021 04/01/2019   Overweight (BMI 25.0-29.9) 01/25/2019   S/P laparoscopic cholecystectomy 12/08/2018   Hyperlipidemia associated with type 2 diabetes mellitus ([place]) 06/27/2018   FHx: heart disease 02/14/2018   Osteopenia 11/09/2017   Seasonal allergies 12/15/2016   COPD (chronic obstructive pulmonary disease) ([place]) 12/20/2014   OSA and COPD overlap syndrome ([place]) 11/19/2014   GERD  11/19/2014   Medication management 09/27/2013   T2_NIDDM w/CKD 2  ([place])    Vitamin  D deficiency    Iron deficiency anemia 06/17/2012   Smoker 06/16/2012   Essential hypertension 06/02/2012   Depression, major, in remission ([place]) 04/09/1988   IBS (irritable bowel syndrome) 02/07/1988   Bell's palsy 01/07/1985    Screening Tests Immunization History  Administered Date(s) Administered   DT (Pediatric) 05/21/2015   Fluad Quad(high Dose 65+) 12/17/2021   Influenza, High Dose Seasonal PF 01/26/2019, 12/08/2019   Influenza,inj,Quad PF,6+ Mos 12/02/2016   Influenza,inj,quad, With Preservative 12/09/2015   Influenza-Unspecified 12/07/2016, 12/11/2020   Moderna Sars-Covid-2 Vaccination 04/24/2019, 05/23/2019, 01/17/2020   PPD Test 03/21/2013, 05/21/2015   Pneumococcal  Conjugate-13 01/04/2014   Pneumococcal Polysaccharide-23 03/09/2004, 11/11/2017   Td 03/09/2004   Tdap 03/10/2015   Typhoid Inactivated 05/10/2014   Zoster Recombinat (Shingrix) 05/21/2020, 07/21/2020   Zoster, Live 12/22/2012    Preventative care: Last colonoscopy: 06/2021- repeat was due 12/2021 but was canceled Last mammogram: 11/28/21 benign repeat 1 year Last pap smear/pelvic exam: 18/2023 with GYN DEXA: 08/20/21 osteopenia T score - 2.2 CT low dose lung screen 04/26/20 emphysema, aortic atherosclerosis repeat in 1 year  Prior vaccinations: TD or Tdap: 2017  Influenza: 20 Pneumococcal: 2019 Prevnar13: 2015 COVID vaccines done Shingles/Zostavax: 2014, 2/2 Shingrix 2022  Names of Other Physician/Practitioners you currently use: 1. [facility] here for primary care 2. Dr. Delman Cheadle, eye doctor, last visit 2023 3. Dr. Otila Kluver, dentist, last visit 2023  Patient Care Team: [name], MD as PCP - General (Internal Medicine) Juanita Craver, MD as Consulting Physician (Gastroenterology)  SURGICAL HISTORY She  has a past surgical history that includes Colonoscopy (05/2005); Total knee arthroplasty (Left, 2012); Bladder suspension (03/2006, 07/2006, 01/2007); Total  knee revision (2014); Tonsillectomy and adenoidectomy; Lumbar fusion (Right, 09/2016); Cholecystectomy (N/A, 12/08/2018); Total knee revision (Left, 08/2018); Colonoscopy with propofol (N/A, 06/27/2021); polypectomy (06/27/2021); and Hemostasis clip placement (06/27/2021). FAMILY HISTORY Her family history includes Dementia in her mother; Diabetes in her father; Heart attack (age of onset: 65) in her paternal grandfather; Hypertension in her father; Parkinson's disease in her father. SOCIAL HISTORY She  reports that she has been smoking cigarettes. She started smoking about 50 years ago. She has a 35.25 pack-year smoking history. She has never used smokeless tobacco. She reports that she does not drink alcohol and does not use drugs.  MEDICARE WELLNESS OBJECTIVES: Physical activity: Current Exercise Habits: Home exercise routine, Type of exercise: calisthenics, Time (Minutes): 20, Frequency (Times/Week): 3, Weekly Exercise (Minutes/Week): 60, Intensity: Mild, Exercise limited by: orthopedic condition(s) Cardiac risk factors: Cardiac Risk Factors include: advanced age (>44mn, >>53women);diabetes mellitus;dyslipidemia;hypertension;sedentary lifestyle;smoking/ tobacco exposure Depression/mood screen:      02/02/2022   11:29 AM  Depression screen PHQ 2/9  Decreased Interest 0  Down, Depressed, Hopeless 1  PHQ - 2 Score 1    ADLs:     02/02/2022   11:29 AM  In your present state of health, do you have any difficulty performing the following activities:  Hearing? 0  Vision? 0  Difficulty concentrating or making decisions? 0  Walking or climbing stairs? 1  Dressing or bathing? 0  Doing errands, shopping? 0     Cognitive Testing  Alert? Yes  Normal Appearance?Yes  Oriented to person? Yes  Place? Yes   Time? Yes  Recall of three objects?  Yes  Can perform simple calculations? Yes  Displays appropriate judgment?Yes  Can read the correct time from a watch face?Yes  EOL planning: Does  Patient Have a Medical Advance Directive?: Yes Type of Advance Directive: Healthcare Power of Attorney, Living will Does patient want to make changes to medical advance directive?: No - Patient declined [place] of [place]?: No - copy requested  Review of Systems  Constitutional:  Negative for malaise/fatigue and weight loss.  HENT:  Positive for congestion (seasonal allergies). Negative for hearing loss and tinnitus.   Eyes:  Negative for blurred vision and double vision.  Respiratory:  Negative for cough, sputum production, shortness of breath and wheezing.   Cardiovascular:  Negative for chest pain, palpitations, orthopnea, claudication, leg swelling and PND.  Gastrointestinal:  Negative for  abdominal pain, blood in stool, constipation, diarrhea, heartburn, melena, nausea and vomiting.  Genitourinary:  Negative for dysuria, flank pain, frequency, hematuria and urgency.  Musculoskeletal:  Positive for falls (unsteady gait due to unequal leg lenghts, has done PT) and joint pain (left knee, right shoulder). Negative for myalgias.  Skin:  Negative for rash.  Neurological:  Negative for dizziness, tingling, sensory change, weakness and headaches.  Endo/Heme/Allergies:  Negative for polydipsia.  Psychiatric/Behavioral:  Positive for depression. Negative for memory loss, substance abuse and suicidal ideas. The patient is not nervous/anxious and does not have insomnia.   All other systems reviewed and are negative.    Objective:     Today's Vitals   02/02/22 1054  BP: 118/74  Pulse: 87  Temp: (!) 97.5 F (36.4 C)  SpO2: 97%  Weight: 172 lb 3.2 oz (78.1 kg)  Height: _0  (1.651 m)   Body mass index is 28.66 kg/m. General Appearance: Well nourished, in no apparent distress. Eyes: PERRLA, EOMs, conjunctiva no swelling or erythema Sinuses: No Frontal/maxillary tenderness ENT/Mouth: Ext aud canals clear, TMs without erythema, bulging. No erythema, swelling, or  exudate on post pharynx.  Tonsils not swollen or erythematous. Hearing normal.  Neck: Supple, thyroid normal.  Respiratory: Respiratory effort normal, BS equal bilaterally without rales, rhonchi, wheezing or stridor.  Cardio: RRR with no MRGs. Brisk peripheral pulses without edema.  Abdomen: Soft, + BS.  Non tender, no guarding, rebound, hernias, masses. Lymphatics: Non tender without lymphadenopathy.  Musculoskeletal: Full ROM, 4/5 strength right shoulder and left knee, Normal gait with walker Skin: Warm, dry without rashes, lesions, ecchymosis.  Neuro: Cranial nerves intact. No cerebellar symptoms.  Psych: Awake and oriented X 3, normal affect, Insight and Judgment appropriate.   Medicare Attestation I have personally reviewed: The patient's medical and social history Their use of alcohol, tobacco or illicit drugs Their current medications and supplements The patient's functional ability including ADLs,fall risks, home safety risks, cognitive, and hearing and visual impairment Diet and physical activities Evidence for depression or mood disorders  The patient's weight, height, BMI, and visual acuity have been recorded in the chart.  I have made referrals, counseling, and provided education to the patient based on review of the above and I have provided the patient with a written personalized care plan for preventive services.     Alycia Rossetti, NP   02/02/2022

## 2022-02-02 NOTE — Patient Instructions (Signed)

## 2022-02-03 LAB — COMPLETE METABOLIC PANEL WITH GFR
AG Ratio: 1.2 (calc) (ref 1.0–2.5)
ALT: 16 U/L (ref 6–29)
AST: 24 U/L (ref 10–35)
Albumin: 3.7 g/dL (ref 3.6–5.1)
Alkaline phosphatase (APISO): 133 U/L (ref 37–153)
BUN: 18 mg/dL (ref 7–25)
CO2: 28 mmol/L (ref 20–32)
Calcium: 9.2 mg/dL (ref 8.6–10.4)
Chloride: 105 mmol/L (ref 98–110)
Creat: 0.7 mg/dL (ref 0.50–1.05)
Globulin: 3.1 g/dL (calc) (ref 1.9–3.7)
Glucose, Bld: 78 mg/dL (ref 65–99)
Potassium: 4 mmol/L (ref 3.5–5.3)
Sodium: 142 mmol/L (ref 135–146)
Total Bilirubin: 0.5 mg/dL (ref 0.2–1.2)
Total Protein: 6.8 g/dL (ref 6.1–8.1)
eGFR: 94 mL/min/{1.73_m2} (ref >=60)

## 2022-02-03 LAB — CBC WITH DIFFERENTIAL/PLATELET
Absolute Monocytes: 468 cells/uL (ref 200–950)
Basophils Absolute: 20 cells/uL (ref 0–200)
Basophils Relative: 0.3 %
Eosinophils Absolute: 169 cells/uL (ref 15–500)
Eosinophils Relative: 2.6 %
HCT: 41.7 % (ref 35.0–45.0)
Hemoglobin: 13.9 g/dL (ref 11.7–15.5)
Lymphs Abs: 1599 cells/uL (ref 850–3900)
MCH: 30.4 pg (ref 27.0–33.0)
MCHC: 33.3 g/dL (ref 32.0–36.0)
MCV: 91.2 fL (ref 80.0–100.0)
MPV: 11.6 fL (ref 7.5–12.5)
Monocytes Relative: 7.2 %
Neutro Abs: 4245 cells/uL (ref 1500–7800)
Neutrophils Relative %: 65.3 %
Platelets: 192 10*3/uL (ref 140–400)
RBC: 4.57 10*6/uL (ref 3.80–5.10)
RDW: 12.8 % (ref 11.0–15.0)
Total Lymphocyte: 24.6 %
WBC: 6.5 10*3/uL (ref 3.8–10.8)

## 2022-02-04 ENCOUNTER — Encounter: Payer: Self-pay | Admitting: Nurse Practitioner

## 2022-02-18 ENCOUNTER — Ambulatory Visit (INDEPENDENT_AMBULATORY_CARE_PROVIDER_SITE_OTHER): Payer: Medicare Other | Admitting: Gastroenterology

## 2022-02-18 ENCOUNTER — Encounter: Payer: Self-pay | Admitting: Gastroenterology

## 2022-02-18 ENCOUNTER — Encounter: Payer: Self-pay | Admitting: Internal Medicine

## 2022-02-18 VITALS — BP 122/72 | HR 92 | Ht 65.0 in | Wt 176.0 lb

## 2022-02-18 DIAGNOSIS — Z86010 Personal history of colonic polyps: Secondary | ICD-10-CM | POA: Diagnosis not present

## 2022-02-18 DIAGNOSIS — K581 Irritable bowel syndrome with constipation: Secondary | ICD-10-CM

## 2022-02-18 MED ORDER — LINACLOTIDE 72 MCG PO CAPS: 72.0000 ug | ORAL_CAPSULE | Freq: Every day | ORAL | 0 refills | Status: DC

## 2022-02-18 MED ORDER — AMBULATORY NON FORMULARY MEDICATION: 0 refills | Status: DC

## 2022-02-18 NOTE — Patient Instructions (Signed)
You have been scheduled for a colonoscopy. Please follow written instructions given to you at your visit today.  Please pick up your prep supplies at the pharmacy within the next 1-3 days. If you use inhalers (even only as needed), please bring them with you on the day of your procedure.  We are giving you samples today of Linzess 75mg to try.   Linzess works best when taken once a day every day, on an empty stomach, at least 30 minutes before your first meal of the day.  When Linzess is taken daily as directed:  *Constipation relief is typically felt in about a week *IBS-C patients may begin to experience relief from belly pain and overall abdominal symptoms (pain, discomfort, and bloating) in about 1 week,   with symptoms typically improving over 12 weeks.  Diarrhea may occur in the first 2 weeks -keep taking it.  The diarrhea should go away and you should start having normal, complete, full bowel movements. It may be helpful to start treatment when you can be near the comfort of your own bathroom, such as a weekend.    We have provided you with a rx for a bedside commode today.   I appreciate the opportunity to care for you. KHarl BowieMD

## 2022-02-18 NOTE — Progress Notes (Signed)
[name]    [id number]    10-May-1950  Primary Care Physician:McKeown, Gwyndolyn Saxon, MD  Referring Physician: [name], MD [place] [address] [place],  [place] [address]   Chief complaint:  Colon polyps  HPI:  70 year old very pleasant female here for new patient visit to discuss surveillance colonoscopy.  She was previously followed by Dr. Adriana Mccallum. She underwent colonoscopy in April with resection of large polyp, had multiple hemostatic clips placed subsequent to the procedure she has been having intermittent right lower quadrant tenderness and abdominal bloating.  She was recommended to undergo surveillance colonoscopy in 6 months, she has not scheduled it yet, she is reluctant to back because she has concerns about her previous experience Denies any rectal bleeding, melena, vomiting, unintentional weight loss or decreased appetite  Colonoscopy 06/27/21 Three sessile polyps were found in the cecum. The polyps were 5 to 20 mm in size. These polyps were removed with a cold snare. Resection and retrieval were complete. To prevent bleeding postintervention, five hemostatic clips were successfully placed. There was no bleeding at the end of the procedure.  A. COLON, CECAL, POLYPECTOMY:  - Tubular adenoma(s) without high-grade dysplasia or malignancy  - Sessile serrated polyp(s) without cytologic dysplasia      Outpatient Encounter Medications as of 02/18/2022  Medication Sig   ALPRAZolam (XANAX) 0.25 MG tablet Take 1/2 to 1 tablet 2 x / day if needed for anxiety. (Patient taking differently: Take 0.25 mg by mouth 2 (two) times daily.)   b complex vitamins capsule Take 1 capsule by mouth daily.   BIOTIN PO Take 1 tablet by mouth daily.   Cholecalciferol (VITAMIN D) 50 MCG (2000 UT) CAPS Take 2,000 Units by mouth daily.   famotidine (PEPCID) 10 MG tablet Take 10 mg by mouth at bedtime as needed for heartburn.   LINZESS 145 MCG CAPS capsule Take 145  mcg by mouth every other day.   Magnesium 500 MG TABS Take 500 mg by mouth daily.   metoCLOPramide (REGLAN) 10 MG tablet Take  1 tablet  4 x /day  before Meals & Bedtime for Acid Reflux / Patient knows to take by mouth (Patient taking differently: Take 10 mg by mouth at bedtime.)   milk thistle 175 MG tablet Take 175 mg by mouth daily.   mometasone (ELOCON) 0.1 % ointment Apply topically two to three times weekly.   Omega-3 Fatty Acids (FISH OIL OMEGA-3 PO) Take 1 capsule by mouth daily.   ONETOUCH DELICA LANCETS 01X MISC Check blood sugar 1 time daily-DX-E11.22   ONETOUCH VERIO test strip USE TO CHECK BLOOD SUGAR ONCE DAILY   rosuvastatin (CRESTOR) 10 MG tablet TAKE 1 TABLET BY MOUTH DAILY FOR CHOLESTEROL   terbinafine (LAMISIL) 250 MG tablet TAKE 1 TABLET BY MOUTH EVERY DAY FOR ONE MONTH ON AND 1 MONTH OFF FOR NAIL FUNGUS. SKIP EVERY OTHER MONTH.   venlafaxine XR (EFFEXOR-XR) 150 MG 24 hr capsule Take 150 mg by mouth 2 (two) times daily.    vitamin C (ASCORBIC ACID) 500 MG tablet Take 500 mg by mouth daily.   vitamin E 180 MG (400 UNITS) capsule Take 400 Units by mouth daily.   zaleplon (SONATA) 10 MG capsule Take 10 mg by mouth at bedtime as needed for sleep.   zinc gluconate 50 MG tablet Take 50 mg by mouth daily.   [DISCONTINUED] glucose blood (ACCU-CHEK AVIVA PLUS) test strip    No facility-administered encounter medications  on file as of 02/18/2022.    Allergies as of 02/18/2022 - Review Complete 02/18/2022  Allergen Reaction Noted   Penicillins Anaphylaxis and Rash 06/02/2012   Sulfa antibiotics Shortness Of Breath and Rash 06/02/2012   Hydrocodone Nausea And Vomiting 06/24/2021   Celebrex [celecoxib] Other (See Comments) 03/20/2013   Ciprofloxacin Nausea And Vomiting 03/20/2013   Codeine Nausea And Vomiting 06/02/2012   Levaquin [levofloxacin] Nausea And Vomiting 03/20/2013   Motrin ib [ibuprofen] Hives 03/20/2013   Prempro [conj estrog-medroxyprogest ace] Hives 03/20/2013     Past Medical History:  Diagnosis Date   Abscessed tooth    Anemia    Anxiety    Arthritis    Bell's palsy 01/1985   COPD (chronic obstructive pulmonary disease) (HCC)    Depression    Diabetes mellitus without complication ([facility])    Fibroid uterus 12/20/2014   7cm, 3.4cm and 1.9cm fibroids    Gallstones    GERD (gastroesophageal reflux disease)    Hyperlipidemia    Hypertension    IBS (irritable bowel syndrome) 02/1988   Obesity hypoventilation syndrome ([facility]) 11/19/2014   Obesity, Class III, BMI 40-49.9 (morbid obesity) ([facility])    OSA (obstructive sleep apnea)    no cpap   Osteopenia    Vitamin D deficiency     Past Surgical History:  Procedure Laterality Date   BLADDER SUSPENSION  03/2006, 07/2006, 01/2007   for stress incontinence   CHOLECYSTECTOMY N/A 12/08/2018   Procedure: LAPAROSCOPIC CHOLECYSTECTOMY;  Surgeon: Coralie Keens, MD;  Location: [facility];  Service: General;  Laterality: N/A;   COLONOSCOPY  05/2005   Normal, recheck 10 yrs   COLONOSCOPY WITH PROPOFOL N/A 06/27/2021   Procedure: COLONOSCOPY WITH PROPOFOL;  Surgeon: Carol Ada, MD;  Location: [facility];  Service: Endoscopy;  Laterality: N/A;   HEMOSTASIS CLIP PLACEMENT  06/27/2021   Procedure: HEMOSTASIS CLIP PLACEMENT;  Surgeon: Carol Ada, MD;  Location: [facility];  Service: Endoscopy;;   LUMBAR FUSION Right 09/2016   L4-5 fusion by Dr. Sherley Bounds   POLYPECTOMY  06/27/2021   Procedure: POLYPECTOMY;  Surgeon: Carol Ada, MD;  Location: [facility];  Service: Endoscopy;;   TONSILLECTOMY AND ADENOIDECTOMY     TOTAL KNEE ARTHROPLASTY Left 2012   x 2 left knee   TOTAL KNEE REVISION  2014   TOTAL KNEE REVISION Left 08/2018   Dr Tracie Harrier, [facility]    Family History  Problem Relation Age of Onset   Dementia Mother    Hypertension Father    Diabetes Father    Parkinson's disease Father    Heart attack Paternal Grandfather 23       Died MI   Colon cancer Neg Hx    Esophageal cancer  Neg Hx    Pancreatic cancer Neg Hx    Stomach cancer Neg Hx     Social History   Socioeconomic History   Marital status: Divorced    Spouse name: Not on file   Number of children: 1   Years of education: Not on file   Highest education level: Not on file  Occupational History    Employer: RETIRED  Tobacco Use   Smoking status: Every Day    Packs/day: 0.75    Years: 47.00    Total pack years: 35.25    Types: Cigarettes    Start date: 03/10/1971   Smokeless tobacco: Never   Tobacco comments:    1 pack/day from age [age], now down to 0.5  Vaping Use   Vaping Use:  Never used  Substance and Sexual Activity   Alcohol use: No   Drug use: No   Sexual activity: Not Currently    Partners: Male    Birth control/protection: Post-menopausal  Other Topics Concern   Not on file  Social History Narrative   Lives alone.     Social Determinants of Health   Financial Resource Strain: Not on file  Food Insecurity: Not on file  Transportation Needs: Not on file  Physical Activity: Not on file  Stress: Not on file  Social Connections: Not on file  Intimate Partner Violence: Not on file      Review of systems: All other review of systems negative except as mentioned in the HPI.   Physical Exam: Vitals:   02/18/22 0956  BP: 122/72  Pulse: 92  SpO2: 99%   Body mass index is 29.29 kg/m. Gen:      No acute distress HEENT:  sclera anicteric Abd:      soft, non-tender; no palpable masses, no distension Ext:    No edema Neuro: alert and oriented x 3 Psych: normal mood and affect  Data Reviewed:  Reviewed labs, radiology imaging, old records and pertinent past GI work up   Assessment and Plan/Recommendations:  71 year old very pleasant female with history of advanced adenomatous colon polyps with abdominal bloating and irregular bowel habits likely secondary to IBS predominant constipation Use low-dose Linzess 72 mcg daily, will titrate dose based on response Increase  dietary fiber and water intake  Due for surveillance colonoscopy, will schedule it The risks and benefits as well as alternatives of endoscopic procedure(s) have been discussed and reviewed. All questions answered. The patient agrees to proceed.   The patient was provided an opportunity to ask questions and all were answered. The patient agreed with the plan and demonstrated an understanding of the instructions.  Damaris Hippo , MD    CC: [name], MD

## 2022-02-24 ENCOUNTER — Encounter: Payer: Self-pay | Admitting: Gastroenterology

## 2022-03-06 ENCOUNTER — Ambulatory Visit
Admission: RE | Admit: 2022-03-06 | Discharge: 2022-03-06 | Disposition: A | Discharge status: Home / Self Care | Payer: Medicare Other | Source: Ambulatory Visit | Attending: Nurse Practitioner | Admitting: Nurse Practitioner

## 2022-03-06 DIAGNOSIS — F17200 Nicotine dependence, unspecified, uncomplicated: Secondary | ICD-10-CM

## 2022-03-06 DIAGNOSIS — J439 Emphysema, unspecified: Secondary | ICD-10-CM

## 2022-03-11 ENCOUNTER — Other Ambulatory Visit: Payer: Self-pay | Admitting: Nurse Practitioner

## 2022-03-11 DIAGNOSIS — I771 Stricture of artery: Secondary | ICD-10-CM

## 2022-03-13 ENCOUNTER — Telehealth: Payer: Self-pay | Admitting: Gastroenterology

## 2022-03-13 NOTE — Telephone Encounter (Signed)
Got it.

## 2022-03-13 NOTE — Telephone Encounter (Signed)
Called the patient to offer a different date for her case (ID [id number]).  No answer. Left my name and phone number on her voicemail.

## 2022-03-13 NOTE — Telephone Encounter (Signed)
Patient called to cancel her hospital procedure, said she is just not up for the prep.

## 2022-03-30 ENCOUNTER — Ambulatory Visit (HOSPITAL_COMMUNITY): Admit: 2022-03-30 | Payer: Medicare Other | Admitting: Gastroenterology

## 2022-03-30 ENCOUNTER — Encounter (HOSPITAL_COMMUNITY): Payer: Self-pay

## 2022-03-30 SURGERY — COLONOSCOPY WITH PROPOFOL
Anesthesia: Monitor Anesthesia Care

## 2022-03-31 ENCOUNTER — Encounter: Payer: Self-pay | Admitting: Nurse Practitioner

## 2022-03-31 ENCOUNTER — Other Ambulatory Visit: Payer: Self-pay

## 2022-03-31 ENCOUNTER — Other Ambulatory Visit: Payer: Self-pay | Admitting: Nurse Practitioner

## 2022-03-31 DIAGNOSIS — B351 Tinea unguium: Secondary | ICD-10-CM

## 2022-03-31 DIAGNOSIS — E782 Mixed hyperlipidemia: Secondary | ICD-10-CM

## 2022-03-31 DIAGNOSIS — E1122 Type 2 diabetes mellitus with diabetic chronic kidney disease: Secondary | ICD-10-CM

## 2022-03-31 MED ORDER — ROSUVASTATIN CALCIUM 10 MG PO TABS: ORAL_TABLET | ORAL | 3 refills | Status: DC

## 2022-04-03 ENCOUNTER — Encounter: Payer: Self-pay | Admitting: Internal Medicine

## 2022-05-21 ENCOUNTER — Encounter: Payer: Self-pay | Admitting: Internal Medicine

## 2022-05-21 ENCOUNTER — Ambulatory Visit (INDEPENDENT_AMBULATORY_CARE_PROVIDER_SITE_OTHER): Payer: Medicare Other | Admitting: Internal Medicine

## 2022-05-21 VITALS — BP 138/80 | HR 88 | Temp 97.9°F | Resp 17 | Ht 65.0 in | Wt 179.8 lb

## 2022-05-21 DIAGNOSIS — I700 Atherosclerosis of aorta: Secondary | ICD-10-CM

## 2022-05-21 DIAGNOSIS — E559 Vitamin D deficiency, unspecified: Secondary | ICD-10-CM

## 2022-05-21 DIAGNOSIS — Z79899 Other long term (current) drug therapy: Secondary | ICD-10-CM

## 2022-05-21 DIAGNOSIS — I10 Essential (primary) hypertension: Secondary | ICD-10-CM | POA: Diagnosis not present

## 2022-05-21 DIAGNOSIS — R4189 Other symptoms and signs involving cognitive functions and awareness: Secondary | ICD-10-CM

## 2022-05-21 DIAGNOSIS — N182 Chronic kidney disease, stage 2 (mild): Secondary | ICD-10-CM

## 2022-05-21 DIAGNOSIS — E1122 Type 2 diabetes mellitus with diabetic chronic kidney disease: Secondary | ICD-10-CM

## 2022-05-21 DIAGNOSIS — K219 Gastro-esophageal reflux disease without esophagitis: Secondary | ICD-10-CM

## 2022-05-21 NOTE — Progress Notes (Addendum)
Future Appointments  Date Time Provider Department  05/21/2022                 6 mo  ov  3:30 PM [name], MD [facility]  09/03/2022                wellness 11:30 AM Alycia Rossetti, NP [facility]  11/25/2022                  cpe  3:00 PM [name], MD [facility]    History of Present Illness:       This very nice   72 y.o.  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  Patient has GERD controlled on her meds. Patient is followed in pain Mgmt by Dr Hardin Negus & sees Dr Mina Marble for EDSI's.  Abd CT scan in 2021 showed Aortic Atherosclerosis        Patient relates recognition of increasing difficulty with short & long term memory recall  and comprehension and also that her daughter who lives in the [place] area has expressed major concerns regarding her ability to function independently, manage finances, drive, etc and patient requests Neurology referral for "Cognitive Evaluation".        Patient is treated for HTN (2007)  & BP has been controlled at home. Today's BP is at goal  -  138/80 .      Patient has had no complaints of any cardiac type chest pain, palpitations, dyspnea Vertell Limber /PND, dizziness, claudication, or dependent edema.       Hyperlipidemia is controlled with diet & Rosuvastatin. Patient denies myalgias or other med SE's. 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 She has had no symptoms of reactive hypoglycemia, diabetic polys, paresthesias or visual blurring.  Last A1c was normal & at goal :  Lab Results  Component Value Date   HGBA1C 5.3 11/20/2021                                                         Further,  the patient also has history of Vitamin D Deficiency ("13" /2008) and supplements vitamin D without any suspected side-effects. Last vitamin D was at goal :  Lab Results  Component Value Date   VD25OH 70 11/20/2021       Current Outpatient Medications  Medication Instructions   ALPRAZolam (XANAX) 0.25 MG tablet Take 1/2 to 1 tablet 2 x / day if needed for anxiety.   ascorbic acid (VITAMIN C) 500 mg, Oral, Daily   b complex vitamins capsule 1 capsule, Oral, Daily   BIOTIN PO 1 tablet, Oral, Daily   famotidine (PEPCID) 10 mg, Oral, At bedtime PRN   linaclotide (LINZESS) 72 mcg, Oral, Daily before breakfast   Linzess 145 mcg, Oral, Every other day   Magnesium 500 mg, Oral, Daily   metoCLOPramide (REGLAN) 10 MG tablet Take  1 tablet  4 x /day  before Meals & Bedtime f   milk thistle 175 mg, Oral, Daily   mometasone (ELOCON) 0.1 % ointment Apply topically two to three times weekly.   Omega-3 FISH OIL  1 capsule, Oral, Daily   rosuvastatin (CRESTOR) 10 MG tablet TAKE 1 TABLET BY MOUTH DAILY FOR CHOLESTEROL   terbinafine (LAMISIL) 250 MG tablet TAKE 1 TABLET EVERY DAY FOR ONE MONTH ON AND 1 MONTH OFF    venlafaxine XR (EFFEXOR-XR) 150 mg, Oral, 2 times daily   Vitamin D 2,000 Units, Oral, Daily   vitamin E 400 Units, Oral, Daily   zaleplon (SONATA) 10 mg, Oral, At bedtime PRN   zinc gluconate 50 mg, Oral, Daily     Allergies  Allergen Reactions   Penicillins Anaphylaxis and Rash   Sulfa Antibiotics Shortness Of Breath    Also light headed, rash   Celebrex [Celecoxib] Other (See Comments)    Elevates Blood Pressure   Ciprofloxacin Nausea And Vomiting   Codeine  &  hydrocodone Nausea And Vomiting   Levaquin [Levofloxacin] Nausea And Vomiting   Motrin Ib [Ibuprofen] Hives   Prempro [Conj Estrog-Medroxyprogest Ace] Hives     PMHx:   Past Medical History:  Diagnosis Date   Abscessed tooth    Anemia    Anxiety    Arthritis    Bell's palsy [id number]   Complication of anesthesia     COPD (chronic obstructive pulmonary disease) (HCC)    Depression    Diabetes mellitus without complication ([place])    Fibroid uterus 12/20/2014   7cm, 3.4cm and 1.9cm fibroids    GERD (gastroesophageal reflux disease)    Hypertension    IBS (irritable bowel syndrome) 12/89   Obesity hypoventilation syndrome ([facility]) 11/19/2014   Obesity, Class III, BMI 40-49.9 (morbid obesity) (HCC)    OSA (obstructive sleep apnea)  -  no cpap       Vitamin D deficiency      Past Surgical History:  Procedure Laterality Date   BLADDER SUSPENSION/2008, 07/2006, 01/2007  1   for stress incontinence   CHOLECYSTECTOMY N/A 12/08/2018   Procedure: LAPAROSCOPIC CHOLECYSTECTOMY;  Surgeon: Coralie Keens, MD;  Location: [facility];  Service: General;  Laterality: N/A;   COLONOSCOPY  05/2005   Normal, recheck 10 yrs   LUMBAR FUSION Right 09/2016   L4-5 fusion by Dr. Sherley Bounds   TONSILLECTOMY AND ADENOIDECTOMY     TOTAL KNEE ARTHROPLASTY Left 2012   x 2 left knee   TOTAL KNEE REVISION  2014   TOTAL KNEE REVISION Left 08/2018   Dr Tracie Harrier, [facility]    FHx:    Reviewed / unchanged  SHx:    Reviewed / unchanged   Systems Review:  Constitutional: Denies fever, chills, wt changes, headaches, insomnia, fatigue, night sweats, change in appetite. Eyes: Denies redness, blurred vision, diplopia, discharge, itchy, watery eyes.  ENT: Denies discharge, congestion, post nasal drip, epistaxis, sore throat, earache, hearing loss, dental pain, tinnitus, vertigo, sinus pain, snoring.  CV: Denies chest pain, palpitations, irregular heartbeat, syncope, dyspnea, diaphoresis, orthopnea, PND, claudication or edema. Respiratory: denies cough, dyspnea, DOE, pleurisy, hoarseness, laryngitis, wheezing.  Gastrointestinal: Denies dysphagia, odynophagia, heartburn, reflux, water brash, abdominal pain or cramps, nausea, vomiting, bloating, diarrhea, constipation, hematemesis, melena, hematochezia  or hemorrhoids. Genitourinary:  Denies dysuria, frequency, urgency, nocturia, hesitancy, discharge, hematuria or flank pain. Musculoskeletal: Denies arthralgias, myalgias, stiffness, jt. swelling, pain, limping or strain/sprain.  Skin: Denies pruritus, rash, hives, warts, acne, eczema or  change in skin lesion(s). Neuro: No weakness, tremor, incoordination, spasms, paresthesia or pain. Psychiatric: Denies confusion, memory loss or sensory loss. Endo: Denies change in weight, skin or hair change.  Heme/Lymph: No excessive bleeding, bruising or enlarged lymph nodes.  Physical Exam  BP 138/80   Pulse 88   Temp 97.9 F (36.6 C)   Resp 17   Ht 5\' 5"  (1.651 m)   Wt 179 lb 12.8 oz (81.6 kg)   LMP 04/09/2009   SpO2 96%   BMI 29.92 kg/m   Appears  over nourished  and in no distress.  Eyes: PERRLA, EOMs, conjunctiva no swelling or erythema. Sinuses: No frontal/maxillary tenderness ENT/Mouth: EAC's clear, TM's nl w/o erythema, bulging. Nares clear w/o erythema, swelling, exudates. Oropharynx clear without erythema or exudates. Oral hygiene is good. Tongue normal, non obstructing. Hearing intact.  Neck: Supple. Thyroid not palpable. Car 2+/2+ without bruits, nodes or JVD. Chest: Respirations nl with BS clear & equal w/o rales, rhonchi, wheezing or stridor.  Cor: Heart sounds normal w/ regular rate and rhythm without sig. murmurs, gallops, clicks or rubs. Peripheral pulses normal and equal  without edema.  Abdomen: Soft, rotund  & bowel sounds normal. Non-tender w/o guarding, rebound, hernias, masses or organomegaly.  Lymphatics: Unremarkable.  Musculoskeletal: Full ROM all peripheral extremities, joint stability, 5/5 strength and normal gait.  Skin: Warm, dry without exposed rashes, lesions or ecchymosis apparent.  Neuro: Cranial nerves intact, reflexes equal bilaterally. Sensory-motor testing grossly intact. Tendon reflexes grossly intact.  Pysch: Alert & oriented x 3.  Insight and judgement nl & appropriate. No  ideations.  Assessment and Plan:  1. Essential hypertension  - Continue medication, monitor blood pressure at home.  - Continue DASH diet.  Reminder to go to the ER if any CP,  SOB, nausea, dizziness, severe HA, changes vision/speech.   - CBC with Differential/Platelet - Magnesium - TSH - COMPLETE METABOLIC PANEL WITH GFR  2. Hyperlipidemia associated with type 2 diabetes mellitus ([place])  - Continue diet/meds, exercise,& lifestyle modifications.  - Continue monitor periodic cholesterol/liver & renal functions    - Lipid panel - TSH  3. Controlled type 2 diabetes mellitus with stage 2 chronic kidney  disease, without long-term current use of insulin (HCC)  - Continue diet, exercise  - Lifestyle modifications.  - Monitor appropriate labs   - Hemoglobin A1c - Insulin, random - COMPLETE METABOLIC PANEL WITH GFR  4. Vitamin D deficiency  - Continue supplementation   - VITAMIN D 25 Hydroxy   5. Gastroesophageal reflux disease  - CBC with Differential/Platelet  6. Morbid obesity ([facility]) - BMI 30+ with OSA, T2DM, htn, hyperlipidemia  - TSH  7. Medication management  - CBC with Differential/Platelet - Magnesium - Lipid panel - TSH - Hemoglobin A1c - Insulin, random - VITAMIN D 25 Hydroxy  - COMPLETE METABOLIC PANEL WITH GFR         Discussed  regular exercise, BP monitoring, weight control to achieve/maintain BMI less than 25 and discussed med and SE's. Recommended labs to assess and monitor clinical status with further disposition pending results of labs.  I discussed the assessment and treatment plan with the patient. The patient was provided an opportunity to ask questions and all were answered. The patient agreed with the plan and demonstrated an understanding of the instructions.  I provided over 30 minutes of exam, counseling, chart review and  complex critical decision making.        The patient was advised to call back  or seek an in-person evaluation if the  symptoms worsen or if the condition fails to improve as anticipated.   Kirtland Bouchard, MD

## 2022-05-21 NOTE — Patient Instructions (Signed)

## 2022-05-22 LAB — LIPID PANEL
Cholesterol: 137 mg/dL (ref <200)
HDL: 49 mg/dL — ABNORMAL LOW (ref >=50)
LDL Cholesterol (Calc): 73 mg/dL (calc)
Non-HDL Cholesterol (Calc): 88 mg/dL (calc) (ref <130)
Total CHOL/HDL Ratio: 2.8 (calc) (ref <5.0)
Triglycerides: 71 mg/dL (ref <150)

## 2022-05-22 LAB — INSULIN, RANDOM: Insulin: 25.2 u[IU]/mL — ABNORMAL HIGH

## 2022-05-22 LAB — CBC WITH DIFFERENTIAL/PLATELET
Absolute Monocytes: 388 cells/uL (ref 200–950)
Basophils Absolute: 41 cells/uL (ref 0–200)
Basophils Relative: 0.6 %
Eosinophils Absolute: 197 cells/uL (ref 15–500)
Eosinophils Relative: 2.9 %
HCT: 39.7 % (ref 35.0–45.0)
Hemoglobin: 13 g/dL (ref 11.7–15.5)
Lymphs Abs: 1727 cells/uL (ref 850–3900)
MCH: 29.7 pg (ref 27.0–33.0)
MCHC: 32.7 g/dL (ref 32.0–36.0)
MCV: 90.8 fL (ref 80.0–100.0)
MPV: 11.5 fL (ref 7.5–12.5)
Monocytes Relative: 5.7 %
Neutro Abs: 4447 cells/uL (ref 1500–7800)
Neutrophils Relative %: 65.4 %
Platelets: 183 10*3/uL (ref 140–400)
RBC: 4.37 10*6/uL (ref 3.80–5.10)
RDW: 13.1 % (ref 11.0–15.0)
Total Lymphocyte: 25.4 %
WBC: 6.8 10*3/uL (ref 3.8–10.8)

## 2022-05-22 LAB — COMPLETE METABOLIC PANEL WITH GFR
AG Ratio: 1.2 (calc) (ref 1.0–2.5)
ALT: 18 U/L (ref 6–29)
AST: 28 U/L (ref 10–35)
Albumin: 3.6 g/dL (ref 3.6–5.1)
Alkaline phosphatase (APISO): 122 U/L (ref 37–153)
BUN: 16 mg/dL (ref 7–25)
CO2: 30 mmol/L (ref 20–32)
Calcium: 9 mg/dL (ref 8.6–10.4)
Chloride: 106 mmol/L (ref 98–110)
Creat: 0.68 mg/dL (ref 0.50–1.05)
Globulin: 3.1 g/dL (calc) (ref 1.9–3.7)
Glucose, Bld: 126 mg/dL — ABNORMAL HIGH (ref 65–99)
Potassium: 3.7 mmol/L (ref 3.5–5.3)
Sodium: 144 mmol/L (ref 135–146)
Total Bilirubin: 0.3 mg/dL (ref 0.2–1.2)
Total Protein: 6.7 g/dL (ref 6.1–8.1)
eGFR: 94 mL/min/{1.73_m2} (ref >=60)

## 2022-05-22 LAB — TSH: TSH: 2.92 mIU/L (ref 0.40–4.50)

## 2022-05-22 LAB — HEMOGLOBIN A1C
Hgb A1c MFr Bld: 5.6 % of total Hgb (ref <5.7)
Mean Plasma Glucose: 114 mg/dL
eAG (mmol/L): 6.3 mmol/L

## 2022-05-22 LAB — MAGNESIUM: Magnesium: 2 mg/dL (ref 1.5–2.5)

## 2022-05-22 LAB — VITAMIN D 25 HYDROXY (VIT D DEFICIENCY, FRACTURES): Vit D, 25-Hydroxy: 75 ng/mL (ref 30–100)

## 2022-05-22 NOTE — Progress Notes (Signed)
<><><><><><><><><><><><><><><><><><><><><><><><><><><><><><><><><> <><><><><><><><><><><><><><><><><><><><><><><><><><><><><><><><><> -   Test results slightly outside the reference range are not unusual. If there is anything important, I will review this with you,  otherwise it is considered normal test values.  If you have further questions,  please do not hesitate to contact me at the office or via My Chart.  <><><><><><><><><><><><><><><><><><><><><><><><><><><><><><><><><> <><><><><><><><><><><><><><><><><><><><><><><><><><><><><><><><><>  -  Chol = 137    &   LDL 73      - Both  Excellent   - Very low risk for Heart Attack  / Stroke <><><><><><><><><><><><><><><><><><><><><><><><><><><><><><><><><>  -  A1c = 5.6%  - Normal  - No Diabetes  -  Great !  <><><><><><><><><><><><><><><><><><><><><><><><><><><><><><><><><>  -  Vitamin D = 75  - Excellent - Please keep dosage same  <><><><><><><><><><><><><><><><><><><><><><><><><><><><><><><><><>  -  All Else - CBC - Kidneys - Electrolytes - Liver - Magnesium & Thyroid    - all  Normal / OK <><><><><><><><><><><><><><><><><><><><><><><><><><><><><><><><><> <><><><><><><><><><><><><><><><><><><><><><><><><><><><><><><><><>

## 2022-05-24 ENCOUNTER — Encounter: Payer: Self-pay | Admitting: Internal Medicine

## 2022-05-24 NOTE — Addendum Note (Signed)
Addended by: [name] on: 05/24/2022 01:14 PM   Modules accepted: Orders

## 2022-06-20 ENCOUNTER — Encounter (HOSPITAL_BASED_OUTPATIENT_CLINIC_OR_DEPARTMENT_OTHER): Payer: Self-pay | Admitting: Emergency Medicine

## 2022-06-20 ENCOUNTER — Emergency Department (HOSPITAL_BASED_OUTPATIENT_CLINIC_OR_DEPARTMENT_OTHER): Payer: Medicare Other

## 2022-06-20 ENCOUNTER — Emergency Department (HOSPITAL_BASED_OUTPATIENT_CLINIC_OR_DEPARTMENT_OTHER)
Admission: EM | Admit: 2022-06-20 | Discharge: 2022-06-20 | Disposition: A | Discharge status: Home / Self Care | Payer: Medicare Other | Attending: Emergency Medicine | Admitting: Emergency Medicine

## 2022-06-20 ENCOUNTER — Other Ambulatory Visit: Payer: Self-pay

## 2022-06-20 DIAGNOSIS — J449 Chronic obstructive pulmonary disease, unspecified: Secondary | ICD-10-CM | POA: Diagnosis not present

## 2022-06-20 DIAGNOSIS — I129 Hypertensive chronic kidney disease with stage 1 through stage 4 chronic kidney disease, or unspecified chronic kidney disease: Secondary | ICD-10-CM | POA: Insufficient documentation

## 2022-06-20 DIAGNOSIS — Y9239 Other specified sports and athletic area as the place of occurrence of the external cause: Secondary | ICD-10-CM | POA: Insufficient documentation

## 2022-06-20 DIAGNOSIS — W2189XA Striking against or struck by other sports equipment, initial encounter: Secondary | ICD-10-CM | POA: Diagnosis not present

## 2022-06-20 DIAGNOSIS — Y93B9 Activity, other involving muscle strengthening exercises: Secondary | ICD-10-CM | POA: Insufficient documentation

## 2022-06-20 DIAGNOSIS — M25562 Pain in left knee: Secondary | ICD-10-CM | POA: Diagnosis present

## 2022-06-20 DIAGNOSIS — E119 Type 2 diabetes mellitus without complications: Secondary | ICD-10-CM | POA: Insufficient documentation

## 2022-06-20 DIAGNOSIS — S72435A Nondisplaced fracture of medial condyle of left femur, initial encounter for closed fracture: Secondary | ICD-10-CM | POA: Insufficient documentation

## 2022-06-20 DIAGNOSIS — N182 Chronic kidney disease, stage 2 (mild): Secondary | ICD-10-CM | POA: Insufficient documentation

## 2022-06-20 NOTE — Discharge Instructions (Signed)
Thank you for coming to [name]. You were seen for knee pain after fall. We did an exam, and imaging, and these showed small nondisplaced distal femur fracture. I called and discussed with Dr. Dion Saucier who recommended weight bearing as tolerated and follow up in clinic. You can take 1,000 mg tylenol (acetaminophen) every 8 hours Please follow up with your primary care provider within 1 week.   Do not hesitate to return to the ED or call 911 if you experience: -Worsening symptoms -Numbness/tingling -Lightheadedness, passing out -Fevers/chills -Anything else that concerns you

## 2022-06-20 NOTE — ED Notes (Signed)
Per provider she wanted a knee brace for the patient not an immobilizer and no crutches.

## 2022-06-20 NOTE — ED Triage Notes (Signed)
Pt had a fall yesterday in the gym. She was holding on to bar infront of her, thinks hands slipped and she fell to her right side. Pt has had 4 surgeries to her left knee and noticed later that day that left knee was hurting and continued through the night.

## 2022-06-20 NOTE — ED Provider Notes (Signed)
EMERGENCY DEPARTMENT AT [facility] Provider Note   CSN: [id number] Arrival date & time: 06/20/22  1644     History {Add pertinent medical, surgical, social history, OB history to HPI:1} No chief complaint on file.   Beth [name] is a 72 y.o. female with HTN, HLD, IBS, h/o bell's palsy, T2DM, OAB, hepatic steatosis, CKD stage 2, COPD, GERD, IDA, osteopenia, who presents with left knee pain after fall. Patient was exercising at the gym doing a standing hip flexion exercise and thinks maybe she hit the bar she was holding with her leg and she fell onto the ground. Did not hit her head when she fell, no LOC. Denies neck pain. Has had left knee pain since the incident. Hasn't been able to put full weight on it. Pt has had 4 surgeries to her left knee and noticed later that day that left knee was hurting and continued through the night.   HPI     Home Medications Prior to Admission medications   Medication Sig Start Date End Date Taking? Authorizing Provider  ALPRAZolam Prudy Feeler) 0.25 MG tablet Take 1/2 to 1 tablet 2 x / day if needed for anxiety. Patient taking differently: Take 0.25 mg by mouth 2 (two) times daily. 07/19/18   Lucky Cowboy, MD  b complex vitamins capsule Take 1 capsule by mouth daily.    [provider]  BIOTIN PO Take 1 tablet by mouth daily.    [provider]  Cholecalciferol (VITAMIN D) 50 MCG (2000 UT) CAPS Take 2,000 Units by mouth daily.    [provider]  famotidine (PEPCID) 10 MG tablet Take 10 mg by mouth at bedtime as needed for heartburn. Patient not taking: Reported on 05/21/2022    [provider]  linaclotide Karlene Einstein) 72 MCG capsule Take 1 capsule (72 mcg total) by mouth daily before breakfast. 02/18/22   Nandigam, Eleonore Chiquito, MD  LINZESS 145 MCG CAPS capsule Take 145 mcg by mouth every other day. Patient not taking: Reported on 05/21/2022 06/13/19   [provider]  Magnesium 500 MG TABS Take  500 mg by mouth daily.    [provider]  metoCLOPramide (REGLAN) 10 MG tablet Take  1 tablet  4 x /day  before Meals & Bedtime for Acid Reflux / Patient knows to take by mouth Patient not taking: Reported on 05/21/2022 09/09/20   Lucky Cowboy, MD  milk thistle 175 MG tablet Take 175 mg by mouth daily.    [provider]  mometasone (ELOCON) 0.1 % ointment Apply topically two to three times weekly. 01/05/22   Jerene Bears, MD  Omega-3 Fatty Acids (FISH OIL OMEGA-3 PO) Take 1 capsule by mouth daily.    [provider]  Dola Argyle LANCETS 33G MISC Check blood sugar 1 time daily-DX-E11.22 11/13/16   Lucky Cowboy, MD  [facility] VERIO test strip USE TO CHECK BLOOD SUGAR ONCE DAILY 01/17/18   Lucky Cowboy, MD  rosuvastatin (CRESTOR) 10 MG tablet TAKE 1 TABLET BY MOUTH DAILY FOR CHOLESTEROL 03/31/22   Raynelle Dick, NP  terbinafine (LAMISIL) 250 MG tablet TAKE 1 TABLET BY MOUTH EVERY DAY FOR ONE MONTH ON AND 1 MONTH OFF FOR NAIL FUNGUS. SKIP EVERY OTHER MONTH. 12/31/21   Raynelle Dick, NP  venlafaxine XR (EFFEXOR-XR) 150 MG 24 hr capsule Take 150 mg by mouth 2 (two) times daily.     [provider]  vitamin C (ASCORBIC ACID) 500 MG tablet Take 500 mg by  mouth daily.    [provider]  vitamin E 180 MG (400 UNITS) capsule Take 400 Units by mouth daily.    [provider]  zaleplon (SONATA) 10 MG capsule Take 10 mg by mouth at bedtime as needed for sleep.    [provider]  zinc gluconate 50 MG tablet Take 50 mg by mouth daily.    [provider]      Allergies    Penicillins, Sulfa antibiotics, Hydrocodone, Celebrex [celecoxib], Ciprofloxacin, Codeine, Levaquin [levofloxacin], Motrin ib [ibuprofen], and Prempro [conj estrog-medroxyprogest ace]    Review of Systems   Review of Systems Review of systems Negative for head trauma, LOC, f/c.  A 10 point review of systems was performed and is negative unless  otherwise reported in HPI.  Physical Exam Updated Vital Signs BP (!) 149/87 (BP Location: Left Arm)   Pulse 80   Temp 98.3 F (36.8 C)   Resp 18   LMP 04/09/2009   SpO2 100%  Physical Exam General: Normal appearing female, lying in bed.  HEENT: PERRLA, Sclera anicteric, MMM, trachea midline.  Cardiology: RRR, no murmurs/rubs/gallops. BL radial and DP pulses equal bilaterally.  Resp: Normal respiratory rate and effort. CTAB, no wheezes, rhonchi, crackles.  Abd: Soft, non-tender, non-distended. No rebound tenderness or guarding.  GU: Deferred. MSK: No peripheral edema or signs of trauma. Extremities without deformity or TTP. No cyanosis or clubbing. Skin: warm, dry. No rashes or lesions. Back: No CVA tenderness Neuro: A&Ox4, CNs II-XII grossly intact. MAEs. Sensation grossly intact.  Psych: Normal mood and affect.   ED Results / Procedures / Treatments   Labs (all labs ordered are listed, but only abnormal results are displayed) Labs Reviewed - No data to display  EKG None  Radiology DG Knee Complete 4 Views Left  Result Date: 06/20/2022 CLINICAL DATA:  Knee pain, injury. Fall yesterday. EXAM: LEFT KNEE - COMPLETE 4+ VIEW COMPARISON:  Most recent comparison 06/22/2010 FINDINGS: Revision total knee arthroplasty. There is a nondisplaced fracture involving the medial distal femur, not definitively extending to the prosthesis, appreciated only on 1 of the oblique views. No periprosthetic lucency. There has been prior patellar resurfacing. IMPRESSION: 1. Nondisplaced fracture involving the medial distal femur, not definitively extending to the prosthesis. 2. Revision total knee arthroplasty. Electronically Signed   By: Narda Rutherford M.D.   On: 06/20/2022 20:06    Procedures Procedures  {Document cardiac monitor, telemetry assessment procedure when appropriate:1}  Medications Ordered in ED Medications - No data to display  ED Course/ Medical Decision Making/ A&P                           Medical Decision Making Amount and/or Complexity of Data Reviewed Radiology: ordered. Decision-making details documented in ED Course.    This patient presents to the ED for concern of fall, knee pain; this involves an extensive number of treatment options, and is a complaint that carries with it a high risk of complications and morbidity. Patient is overall very well-appearing and HDS.   MDM:     HI RISK - Tibial Plateau This *** patient presents with knee pain suspicious for a tibial plateau fracture or femur fracture given history, exam, and mechanism. No e/o compartment syndrome, septic arthritis, other acute fracture. Range of motion is ***. Will get plain films, consider CT, likely ortho consultation, pain control, NWB.  PEARLS: Knee EXTENSION is an extremely important motor finding to document. Inability to extend  is a key indicator of serious pathology. In peds, follow the medial tibial diaphysis up to the epiphysis - it should be smooth all the way. A bump should make you suspicious for a buckle fracture; it's frequently missed by radiology. Proximal fibular fractures are associated with a peroneal nerve injury (you should document its status) Tibial plateau fractures are high risk for compartment syndrome. Tibial spine fractures are a) associated with ACL tears and b) often missed unless you order a tunnel view plain film.  *** Presentation most consistent with knee sprain without joint instability. History, Exam, and Workup not consistent with fracture, compartment syndrome, arterial or nerve injury. Rx: R.I.C.E  Disposition: Discharge. Strict return precautions discussed and understood at bedside. Follow up with primary care doctor within next week for symptom follow up.  Acute knee injury   Knee dislocation   Knee fractures   Patella fracture   Segond fracture   Tibial plateau fracture   Meniscus and ligament knee injuries   Patella dislocation   Patellar  tendonitis   Patellar tendon rupture   Quadriceps tendon rupture Nontraumatic/Subacute   Arthritis   Gout and Pseudogout   Osgood-Schlatter disease   Patellofemoral syndrome (Runner's Knee)   Patellar tendonitis (Jumper's knee)   Pes anserine bursitis   Popliteal cyst (Bakers cyst)   Prepatellar bursitis (nonseptic)   Septic bursitis   Septic joint   DVT   Clinical Course as of 06/20/22 2036  Sat Jun 20, 2022  2012 DG Knee Complete 4 Views Left 1. Nondisplaced fracture involving the medial distal femur, not definitively extending to the prosthesis. 2. Revision total knee arthroplasty.   [HN]    Clinical Course User Index [HN] Loetta Rough, MD    Labs: I Ordered, and personally interpreted labs.  The pertinent results include:  ***  Imaging Studies ordered: I ordered imaging studies including *** I independently visualized and interpreted imaging. I agree with the radiologist interpretation  Additional history obtained from ***.  External records from outside source obtained and reviewed including ***  Cardiac Monitoring: The patient was maintained on a cardiac monitor.  I personally viewed and interpreted the cardiac monitored which showed an underlying rhythm of: ***  Reevaluation: After the interventions noted above, I reevaluated the patient and found that they have :{resolved/improved/worsened:23923::"improved"}  Social Determinants of Health: ***  Disposition:  ***  Co morbidities that complicate the patient evaluation  Past Medical History:  Diagnosis Date   Abscessed tooth    Anemia    Anxiety    Arthritis    Bell's palsy 01/1985   COPD (chronic obstructive pulmonary disease)    Depression    Diabetes mellitus without complication    Fibroid uterus 12/20/2014   7cm, 3.4cm and 1.9cm fibroids    Gallstones    GERD (gastroesophageal reflux disease)    Hyperlipidemia    Hypertension    IBS (irritable bowel syndrome) 02/1988   Obesity  hypoventilation syndrome 11/19/2014   Obesity, Class III, BMI 40-49.9 (morbid obesity)    OSA (obstructive sleep apnea)    no cpap   Osteopenia    Vitamin D deficiency      Medicines No orders of the defined types were placed in this encounter.   I have reviewed the patients home medicines and have made adjustments as needed  Problem List / ED Course: Problem List Items Addressed This Visit   None        {Document critical care time when appropriate:1} {Document review of  labs and clinical decision tools ie heart score, Chads2Vasc2 etc:1}  {Document your independent review of radiology images, and any outside records:1} {Document your discussion with family members, caretakers, and with consultants:1} {Document social determinants of health affecting pt's care:1} {Document your decision making why or why not admission, treatments were needed:1}  This note was created using dictation software, which may contain spelling or grammatical errors.

## 2022-06-20 NOTE — Progress Notes (Signed)
Called regarding left knee pain, periprosthetic fracture.  Upon review of the records, it appears that she had a primary total knee replacement done by Dr. Cleophas Dunker, then subsequently had a polyethylene dislocation which I reduced back in 2012.  She has had subsequent complications, and has had multiple additional operations including a distal femoral replacement.  I am not sure the location of where the distal femoral placement was performed, but it was not within the [facility] health system or by [facility] health surgeon.   She apparently took a fall, and injured her left knee, and went to [facility] emergency room.  Upon review of the x-rays, it appears that the distal femoral replacement is still stable, and in appropriate alignment and position.  It looks like she has a very small nondisplaced crack on some of the metaphyseal bone that is adjacent to the primary prosthesis.  She can be placed in a knee immobilizer, and can weight-bear on that leg if possible based on pain limitations, use crutches or a walker, and can follow-up with me in the office.  I do not think she will need revision surgery based on what I am seeing on the plain films.  She has multiple complicating medical comorbidities, and if she is incapable of being discharged she may need medical admission for placement.  We will see her if she is still in the hospital system tomorrow, otherwise she can see me next week in the office.  Teryl Lucy, MD

## 2022-07-23 ENCOUNTER — Encounter: Payer: Self-pay | Admitting: Neurology

## 2022-07-23 ENCOUNTER — Ambulatory Visit (INDEPENDENT_AMBULATORY_CARE_PROVIDER_SITE_OTHER): Payer: Medicare Other | Admitting: Neurology

## 2022-07-23 ENCOUNTER — Telehealth: Payer: Self-pay | Admitting: Neurology

## 2022-07-23 VITALS — BP 156/91 | HR 83 | Ht 65.0 in | Wt 169.5 lb

## 2022-07-23 DIAGNOSIS — R413 Other amnesia: Secondary | ICD-10-CM | POA: Diagnosis not present

## 2022-07-23 NOTE — Patient Instructions (Signed)
ATN profile to look for Alzheimer disease biomarker  MRI Brain  Continue current medications  Follow up with PCP  Return as needed   There are well-accepted and sensible ways to reduce risk for Alzheimers disease and other degenerative brain disorders .  Exercise Daily Walk A daily 20 minute walk should be part of your routine. Disease related apathy can be a significant roadblock to exercise and the only way to overcome this is to make it a daily routine and perhaps have a reward at the end (something your loved one loves to eat or drink perhaps) or a personal trainer coming to the home can also be very useful. Most importantly, the patient is much more likely to exercise if the caregiver / spouse does it with him/her. In general a structured, repetitive schedule is best.  General Health: Any diseases which effect your body will effect your brain such as a pneumonia, urinary infection, blood clot, heart attack or stroke. Keep contact with your primary care doctor for regular follow ups.  Sleep. A good nights sleep is healthy for the brain. Seven hours is recommended. If you have insomnia or poor sleep habits we can give you some instructions. If you have sleep apnea wear your mask.  Diet: Eating a heart healthy diet is also a good idea; fish and poultry instead of red meat, nuts (mostly non-peanuts), vegetables, fruits, olive oil or canola oil (instead of butter), minimal salt (use other spices to flavor foods), whole grain rice, bread, cereal and pasta and wine in moderation.Research is now showing that the MIND diet, which is a combination of The Mediterranean diet and the DASH diet, is beneficial for cognitive processing and longevity. Information about this diet can be found in The MIND Diet, a book by Alonna Minium, MS, RDN, and online at [employer]  Finances, Power of [address] and Advance Directives: You should consider putting legal safeguards in place with  regard to financial and medical decision making. While the spouse always has power of attorney for medical and financial issues in the absence of any form, you should consider what you want in case the spouse / caregiver is no longer around or capable of making decisions.

## 2022-07-23 NOTE — Telephone Encounter (Signed)
[employer] medicare NPR sent to GI [phone] 

## 2022-07-23 NOTE — Progress Notes (Signed)
[facility]  PATIENT: Beth [name] DOB: [birth year]  REQUESTING CLINICIAN: Lucky Cowboy, MD HISTORY FROM: Patient  REASON FOR VISIT: Memory loss    HISTORICAL  CHIEF COMPLAINT:  Chief Complaint  Patient presents with   New Patient (Initial Visit)    Rm 12, alone Memory concerns, moca:26    HISTORY OF PRESENT ILLNESS:  This is a 72 year old woman with past medical history of anxiety, depression, hyperlipidemia who is presenting per her daughter request for memory problem.  Patient feels like her memory is fine, she is forgetful but independent in all activities of daily living, she has trouble with names but otherwise she is fine.  She reports that her daughter has complained about her memory, she said that patient asked the same questions, about the same thing and at one point she even thought her mother needed to be moved to a nursing home which made patient very upset.  Patient reports that she lives alone, she cooks very little and use a lot of microwave food.  She does some cleaning, has a housekeeper and also does online shopping.  She does not have any issue with self-care.  She manages her bills online.  She drives, denies any accident in any denies being lost in familiar places.   TBI:   No past history of TBI Stroke:   no past history of stroke Seizures:   no past history of seizures Sleep:  Previous history but improved after weight loss. Mood: Yes, both anxiety and depression Family history of Dementia: Both parents with dementia but at a later age (in their 24)  Functional status: independent in all ADLs and IADLs Patient lives alone. Cooking: very little cooking, microwave Cleaning: some, has a Water quality scientist: yes, online shopping  Bathing: no issues  Toileting: no issues  Driving: yes, no recent accident Bills: Online payment   Ever left the stove on by accident?: Denies  Forget how to use items around the house?: Denies   Getting lost going to familiar places?: Denies  Forgetting loved ones names?: Denies  Word finding difficulty? Yes Sleep: Good    OTHER MEDICAL CONDITIONS: Anxiety/Depression, hyperlipidemia,   REVIEW OF SYSTEMS: Full 14 system review of systems performed and negative with exception of: As noted in the HPI   ALLERGIES: Allergies  Allergen Reactions   Penicillins Anaphylaxis and Rash        Sulfa Antibiotics Shortness Of Breath and Rash    Also light headed   Hydrocodone Nausea And Vomiting   Celebrex [Celecoxib] Other (See Comments)    Elevates Blood Pressure    Ciprofloxacin Nausea And Vomiting   Codeine Nausea And Vomiting   Levaquin [Levofloxacin] Nausea And Vomiting   Motrin Ib [Ibuprofen] Hives   Prempro [Conj Estrog-Medroxyprogest Ace] Hives    HOME MEDICATIONS: Outpatient Medications Prior to Visit  Medication Sig Dispense Refill   ALPRAZolam (XANAX) 0.25 MG tablet Take 1/2 to 1 tablet 2 x / day if needed for anxiety. (Patient taking differently: Take 0.25 mg by mouth 2 (two) times daily.) 60 tablet 0   b complex vitamins capsule Take 1 capsule by mouth daily.     BIOTIN PO Take 1 tablet by mouth daily.     Cholecalciferol (VITAMIN D) 50 MCG (2000 UT) CAPS Take 2,000 Units by mouth daily.     famotidine (PEPCID) 10 MG tablet Take 10 mg by mouth at bedtime as needed for heartburn.     linaclotide (LINZESS) 72 MCG capsule Take  1 capsule (72 mcg total) by mouth daily before breakfast. 8 capsule 0   LINZESS 145 MCG CAPS capsule Take 145 mcg by mouth every other day.     Magnesium 500 MG TABS Take 500 mg by mouth daily.     milk thistle 175 MG tablet Take 175 mg by mouth daily.     mometasone (ELOCON) 0.1 % ointment Apply topically two to three times weekly. 45 g 2   Omega-3 Fatty Acids (FISH OIL OMEGA-3 PO) Take 1 capsule by mouth daily.     ONETOUCH DELICA LANCETS 33G MISC Check blood sugar 1 time daily-DX-E11.22 100 each 4   ONETOUCH VERIO test strip USE TO CHECK  BLOOD SUGAR ONCE DAILY 100 each 3   rosuvastatin (CRESTOR) 10 MG tablet TAKE 1 TABLET BY MOUTH DAILY FOR CHOLESTEROL 90 tablet 3   terbinafine (LAMISIL) 250 MG tablet TAKE 1 TABLET BY MOUTH EVERY DAY FOR ONE MONTH ON AND 1 MONTH OFF FOR NAIL FUNGUS. SKIP EVERY OTHER MONTH. 90 tablet 0   venlafaxine XR (EFFEXOR-XR) 150 MG 24 hr capsule Take 150 mg by mouth 2 (two) times daily.      vitamin C (ASCORBIC ACID) 500 MG tablet Take 500 mg by mouth daily.     vitamin E 180 MG (400 UNITS) capsule Take 400 Units by mouth daily.     zaleplon (SONATA) 10 MG capsule Take 10 mg by mouth at bedtime as needed for sleep.     zinc gluconate 50 MG tablet Take 50 mg by mouth daily.     metoCLOPramide (REGLAN) 10 MG tablet Take  1 tablet  4 x /day  before Meals & Bedtime for Acid Reflux / Patient knows to take by mouth (Patient not taking: Reported on 05/21/2022) 360 tablet 3   No facility-administered medications prior to visit.    PAST MEDICAL HISTORY: Past Medical History:  Diagnosis Date   Abscessed tooth    Anemia    Anxiety    Arthritis    Bell's palsy 01/1985   COPD (chronic obstructive pulmonary disease) (HCC)    Depression    Diabetes mellitus without complication (HCC)    Fibroid uterus 12/20/2014   7cm, 3.4cm and 1.9cm fibroids    Gallstones    GERD (gastroesophageal reflux disease)    Hyperlipidemia    Hypertension    IBS (irritable bowel syndrome) 02/1988   Obesity hypoventilation syndrome (HCC) 11/19/2014   Obesity, Class III, BMI 40-49.9 (morbid obesity) (HCC)    OSA (obstructive sleep apnea)    no cpap   Osteopenia    Vitamin D deficiency     PAST SURGICAL HISTORY: Past Surgical History:  Procedure Laterality Date   BLADDER SUSPENSION  03/2006, 07/2006, 01/2007   for stress incontinence   CHOLECYSTECTOMY N/A 12/08/2018   Procedure: LAPAROSCOPIC CHOLECYSTECTOMY;  Surgeon: Abigail Miyamoto, MD;  Location: [facility] OR;  Service: General;  Laterality: N/A;   COLONOSCOPY  05/2005   Normal,  recheck 10 yrs   COLONOSCOPY WITH PROPOFOL N/A 06/27/2021   Procedure: COLONOSCOPY WITH PROPOFOL;  Surgeon: Jeani Hawking, MD;  Location: [facility];  Service: Endoscopy;  Laterality: N/A;   HEMOSTASIS CLIP PLACEMENT  06/27/2021   Procedure: HEMOSTASIS CLIP PLACEMENT;  Surgeon: Jeani Hawking, MD;  Location: [facility];  Service: Endoscopy;;   LUMBAR FUSION Right 09/2016   L4-5 fusion by Dr. Marikay Alar   POLYPECTOMY  06/27/2021   Procedure: POLYPECTOMY;  Surgeon: Jeani Hawking, MD;  Location: [facility];  Service: Endoscopy;;  TONSILLECTOMY AND ADENOIDECTOMY     TOTAL KNEE ARTHROPLASTY Left 2012   x 2 left knee   TOTAL KNEE REVISION  2014   TOTAL KNEE REVISION Left 08/2018   Dr Brandy Hale, [facility] [place]    FAMILY HISTORY: Family History  Problem Relation Age of Onset   Dementia Mother    Hypertension Father    Diabetes Father    Parkinson's disease Father    Heart attack Paternal Grandfather 94       Died MI   Colon cancer Neg Hx    Esophageal cancer Neg Hx    Pancreatic cancer Neg Hx    Stomach cancer Neg Hx     SOCIAL HISTORY: Social History   Socioeconomic History   Marital status: Divorced    Spouse name: Not on file   Number of children: 1   Years of education: Not on file   Highest education level: Not on file  Occupational History    Employer: RETIRED  Tobacco Use   Smoking status: Every Day    Packs/day: 0.75    Years: 47.00    Additional pack years: 0.00    Total pack years: 35.25    Types: Cigarettes    Start date: 03/10/1971   Smokeless tobacco: Never   Tobacco comments:    1 pack/day from age [age], now down to 0.5  Vaping Use   Vaping Use: Never used  Substance and Sexual Activity   Alcohol use: No   Drug use: No   Sexual activity: Not Currently    Partners: Male    Birth control/protection: Post-menopausal  Other Topics Concern   Not on file  Social History Narrative   Lives alone.     Social Determinants of Health   Financial  Resource Strain: Not on file  Food Insecurity: Not on file  Transportation Needs: Not on file  Physical Activity: Not on file  Stress: Not on file  Social Connections: Not on file  Intimate Partner Violence: Not on file    PHYSICAL EXAM  GENERAL EXAM/CONSTITUTIONAL: Vitals:  Vitals:   07/23/22 1253  BP: (!) 156/91  Pulse: 83  Weight: 169 lb 8 oz (76.9 kg)  Height: 5\' 5"  (1.651 m)   Body mass index is 28.21 kg/m. Wt Readings from Last 3 Encounters:  07/23/22 169 lb 8 oz (76.9 kg)  05/21/22 179 lb 12.8 oz (81.6 kg)  02/18/22 176 lb (79.8 kg)   Patient is in no distress; well developed, nourished and groomed; neck is supple  MUSCULOSKELETAL: Gait, strength, tone, movements noted in Neurologic exam below  NEUROLOGIC: MENTAL STATUS:      No data to display            07/23/2022    1:00 PM 07/23/2022   12:56 PM  Montreal Cognitive Assessment   Visuospatial/ Executive (0/5) 5 5  Naming (0/3) 3 3  Attention: Read list of digits (0/2) 2 2  Attention: Read list of letters (0/1) 1 1  Attention: Serial 7 subtraction starting at 100 (0/3) 3   Language: Repeat phrase (0/2) 2   Language : Fluency (0/1) 1   Abstraction (0/2) 2   Delayed Recall (0/5) 1   Orientation (0/6) 6   Total 26   Adjusted Score (based on education) 26      CRANIAL NERVE:  2nd, 3rd, 4th, 6th- visual fields full to confrontation, extraocular muscles intact, no nystagmus 5th - facial sensation symmetric 7th - facial strength symmetric 8th - hearing intact  9th - palate elevates symmetrically, uvula midline 11th - shoulder shrug symmetric 12th - tongue protrusion midline  MOTOR:  normal bulk and tone, full strength in the BUE, BLE  SENSORY:  normal and symmetric to light touch  COORDINATION:  finger-nose-finger, fine finger movements normal  GAIT/STATION:  normal     DIAGNOSTIC DATA (LABS, IMAGING, TESTING) - I reviewed patient records, labs, notes, testing and imaging myself where  available.  Lab Results  Component Value Date   WBC 6.8 05/21/2022   HGB 13.0 05/21/2022   HCT 39.7 05/21/2022   MCV 90.8 05/21/2022   PLT 183 05/21/2022      Component Value Date/Time   NA 144 05/21/2022 1526   K 3.7 05/21/2022 1526   CL 106 05/21/2022 1526   CO2 30 05/21/2022 1526   GLUCOSE 126 (H) 05/21/2022 1526   BUN 16 05/21/2022 1526   CREATININE 0.68 05/21/2022 1526   CALCIUM 9.0 05/21/2022 1526   PROT 6.7 05/21/2022 1526   ALBUMIN 3.7 10/14/2018 1316   AST 28 05/21/2022 1526   ALT 18 05/21/2022 1526   ALKPHOS 150 (H) 10/14/2018 1316   BILITOT 0.3 05/21/2022 1526   GFRNONAA 79 05/14/2020 1428   GFRAA 91 05/14/2020 1428   Lab Results  Component Value Date   CHOL 137 05/21/2022   HDL 49 (L) 05/21/2022   LDLCALC 73 05/21/2022   TRIG 71 05/21/2022   CHOLHDL 2.8 05/21/2022   Lab Results  Component Value Date   HGBA1C 5.6 05/21/2022   Lab Results  Component Value Date   VITAMINB12 322 07/05/2019   Lab Results  Component Value Date   TSH 2.92 05/21/2022     ASSESSMENT AND PLAN  72 y.o. year old female with anxiety depression, hyperlipidemia who is presenting for memory problem.  She reports that she is forgetful about names, but she is independent in all activities of daily living.  She does drive denies, any recent accident and handling her own bills.  Today on exam she scored 26 out of 30 on the Moca which is normal.  Based on examination, patient has normal cognition versus mild cognitive impairment.  Her TSH is normal and she reports taking B12 supplement.  I will obtain ATN profile to look for Alzheimer disease biomarker, and I will also obtain a MRI brain.  I will contact the patient to go over the results and to discuss medication if needed but otherwise advised her to continue following up with PCP and return as needed.   1. Memory loss      Patient Instructions  ATN profile to look for Alzheimer disease biomarker  MRI Brain  Continue current  medications  Follow up with PCP  Return as needed   There are well-accepted and sensible ways to reduce risk for Alzheimers disease and other degenerative brain disorders .  Exercise Daily Walk A daily 20 minute walk should be part of your routine. Disease related apathy can be a significant roadblock to exercise and the only way to overcome this is to make it a daily routine and perhaps have a reward at the end (something your loved one loves to eat or drink perhaps) or a personal trainer coming to the home can also be very useful. Most importantly, the patient is much more likely to exercise if the caregiver / spouse does it with him/her. In general a structured, repetitive schedule is best.  General Health: Any diseases which effect your body will effect your brain such as  a pneumonia, urinary infection, blood clot, heart attack or stroke. Keep contact with your primary care doctor for regular follow ups.  Sleep. A good nights sleep is healthy for the brain. Seven hours is recommended. If you have insomnia or poor sleep habits we can give you some instructions. If you have sleep apnea wear your mask.  Diet: Eating a heart healthy diet is also a good idea; fish and poultry instead of red meat, nuts (mostly non-peanuts), vegetables, fruits, olive oil or canola oil (instead of butter), minimal salt (use other spices to flavor foods), whole grain rice, bread, cereal and pasta and wine in moderation.Research is now showing that the MIND diet, which is a combination of The Mediterranean diet and the DASH diet, is beneficial for cognitive processing and longevity. Information about this diet can be found in The MIND Diet, a book by Alonna Minium, MS, RDN, and online at [employer]  Finances, Power of [address] and Advance Directives: You should consider putting legal safeguards in place with regard to financial and medical decision making. While the spouse always has power  of attorney for medical and financial issues in the absence of any form, you should consider what you want in case the spouse / caregiver is no longer around or capable of making decisions.     Orders Placed This Encounter  Procedures   MR BRAIN WO CONTRAST   ATN PROFILE    No orders of the defined types were placed in this encounter.   Return if symptoms worsen or fail to improve.    Windell Norfolk, MD 07/24/2022, 9:31 AM  [facility] [address] [place], [place] [address] [phone]

## 2022-07-26 ENCOUNTER — Other Ambulatory Visit: Payer: Medicare Other

## 2022-07-27 LAB — ATN PROFILE

## 2022-07-28 LAB — ATN PROFILE
A -- Beta-amyloid 42/40 Ratio: 0.1 — ABNORMAL LOW (ref >0.102)
Beta-amyloid 40: 191.26 pg/mL
Beta-amyloid 42: 19.05 pg/mL
T -- p-tau181: 1.52 pg/mL — ABNORMAL HIGH (ref 0.00–0.97)

## 2022-09-01 ENCOUNTER — Ambulatory Visit
Admission: RE | Admit: 2022-09-01 | Discharge: 2022-09-01 | Disposition: A | Discharge status: Home / Self Care | Payer: Medicare Other | Source: Ambulatory Visit | Attending: Neurology | Admitting: Neurology

## 2022-09-01 DIAGNOSIS — R413 Other amnesia: Secondary | ICD-10-CM | POA: Diagnosis not present

## 2022-09-02 NOTE — Progress Notes (Addendum)
MEDICARE ANNUAL WELLNESS VISIT AND FOLLOW UP   Assessment:    Encounter for Medicare annual wellness exam 1 year  Controlled type 2 diabetes mellitus with chronic kidney disease, without long-term current use of insulin, unspecified CKD stage (HCC) Discussed general issues about diabetes pathophysiology and management., Educational material distributed., Suggested low cholesterol diet., Encouraged aerobic exercise., Discussed foot care., Reminded to get yearly retinal exam. - DM foot exam today - CMP, defer A1c normal 2 months ago  Aortic atherosclerosis (HCC) Control blood pressure, cholesterol, glucose, increase exercise.   OSA and COPD overlap syndrome (HCC) Sleep apnea- continue CPAP, CPAP is helping with daytime fatigue, weight loss still advised.    Smoker/Chronic obstructive pulmonary disease, unspecified COPD type (HCC) No triggers, well controlled symptoms, cont to monitor Strongly encouraged to D/C smoking- not currently ready to quit Low dose Chest CT Lung cancer Screening ordered  Hyperlipidemia associated with type 2 diabetes mellitus (HCC) - CMP check lipids decrease fatty foods increase activity.   Overweight with OSA, T2DM, htn, hyperlipidemia - follow up 3 months for progress monitoring - increase veggies, decrease carbs - long discussion about weight loss, diet, and exercise  Depression, major, in remission (HCC) - continue medications, stress management techniques discussed, increase water, good sleep hygiene discussed, increase exercise, and increase veggies.   Essential hypertension -     CBC with Differential/Platelet -     COMPLETE METABOLIC PANEL WITH GFR - continue medications, DASH diet, exercise and monitor at home. Call if greater than 130/80.   Vitamin D deficiency Continue Vit D supplementation  Irritable bowel syndrome, unspecified type/ polyp on colonoscopy If not on benefiber then add it, decrease stress,  if any worsening symptoms,  blood in stool, AB pain, etc call office Appt scheduled with Dr. Lavon Paganini  Gastroesophageal reflux disease, unspecified whether esophagitis present Continue PPI/H2 blocker, diet discussed  Osteopenia of multiple sites Continue weight bearing exercises and Vit D  Medication management -     CBC with Differential/Platelet -     COMPLETE METABOLIC PANEL WITH GFR -     Lipid panel -     Hemoglobin A1c  Left femur fracture Continue to use walker and exercises as directed by ortho Continue to follow with orthopedics  Onychomycosis of toenail Liver functions have been normal.  Continue Lamisil 30 days on 30 days off -     terbinafine (LAMISIL) 250 MG tablet; TAKE 1 TABLET BY MOUTH EVERY DAY FOR ONE MONTH ON AND 1 MONTH OFF FOR NAIL FUNGUS. SKIP EVERY OTHER MONTH.  Constipation, unspecified constipation type Increase water and fiber intake Continue Linzess as needed -     linaclotide (LINZESS) 72 MCG capsule; Take 1 capsule (72 mcg total) by mouth daily before breakfast.  Memory changes Continue to follow with neurology Avoid any further sleep medications  28/30 MMSE Continue to follow with neurology- having workup for possible Alzheimer Neuro results show positive ATN which shows presence of Alzheimer's disease biomarkers  Over 40 minutes of exam, counseling, chart review and critical decision making was performed Future Appointments  Date Time Provider Department Center  12/24/2022  3:00 PM Lucky Cowboy, MD [facility] None  09/08/2023 11:30 AM Raynelle Dick, NP [facility] None     Plan:   During the course of the visit the patient was educated and counseled about appropriate screening and preventive services including:   Pneumococcal vaccine  Prevnar 13 Influenza vaccine Td vaccine Screening electrocardiogram Bone densitometry screening Colorectal cancer screening Diabetes screening Glaucoma screening  Nutrition counseling  Advanced directives:  requested   Subjective:  Beth [name] is a 72 y.o. female who presents for Medicare Annual Wellness Visit and 3 month follow up.    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  She is using a walker due left knee pain, she had total knee revision in June 2020 with Dr. Brandy Hale but states it was not successful, she has to wear a brace and use her walker still.   She had neurology appointment 07/23/22 with Dr. Teresa Coombs for family concerns of Alzheimers.  Neuro results show positive ATN which shows presence of Alzheimer's disease biomarkers. She did just have MRI- showed small vessel disease, no acute findings. MMSE 26/30  Did Cologuard 02/2021 which was positive.  Colonoscopy 06/27/21 with Dr. Elnoria Howard 3 polyps. Was to have repeat colonoscopy 12/26/21- she has changed GI doctors . Colonoscopy with Dr. Lavon Paganini 06/27/21 repeat due 2028  She got torn rotator cuff after her colonoscopy 06/2021. She has limited range of motion and is doing PT  MRI showed complete tear- ROM is now pretty good but putting right arm behind back is limited.   She fell in 06/20/22 and had nondisplaced fracture of medial distal femur left. She does continue with exercises 3 times a week   She currently continues to smoke, now 1 pack a day; discussed risks associated with smoking, patient is ready to quit. Has declined medications thus far.  She has a 47 pack year smoking history.  Low dose CT lung cancer 03/06/22  repeat-  benign and showed  aortic atherosclerosis and COPD. Continue annual CT for screening  She continues to use terbinafine for onychomycosis of great toe on left foot- almost completely resolved.   She has hx of major depression in remission on effexor; she is also prescribed xanax 0.25 mg BID PRN breakthrough anxiety. She has been sleeping well without medication.   BMI is Body mass index is 27.96 kg/m.,  she is working on diet and exercise. She does use a walker for ambulation due to persistent left knee pain. She does see a nutritionist once a month  Wt Readings from Last 3 Encounters:  09/03/22 168 lb (76.2 kg)  07/23/22 169 lb 8 oz (76.9 kg)  05/21/22 179 lb 12.8 oz (81.6 kg)    Her blood pressure has been controlled at home, previously used Benicar HCT but has been off meds x 3 years. today their BP is BP: 134/80 BP Readings from Last 3 Encounters:  09/03/22 134/80  07/23/22 (!) 156/91  06/20/22 130/80   She does not workout. She denies chest pain, shortness of breath, dizziness.   She is on cholesterol medication (crestor 10mg , not at goal of 70) and denies myalgias. Her cholesterol is at goal. The cholesterol last visit was:   Lab Results  Component Value Date   CHOL 137 05/21/2022   HDL 49 (L) 05/21/2022   LDLCALC 73 05/21/2022   TRIG 71 05/21/2022   CHOLHDL 2.8 05/21/2022    She has not been working on diet and exercise for T2DM currently treated by lifestyle modification,, and denies foot ulcerations, increased appetite, nausea, polydipsia, polyuria and vomiting.  Last A1C in the office was:  Lab Results  Component Value Date   HGBA1C 5.6 05/21/2022   Continues to drink lots of water. Last GFR: Lab Results  Component Value Date   EGFR 94 05/21/2022  Patient is on Vitamin D supplement.   Lab Results  Component Value Date   VD25OH 75 05/21/2022      Medication Review:   Current Outpatient Medications (Cardiovascular):    rosuvastatin (CRESTOR) 10 MG tablet, TAKE 1 TABLET BY MOUTH DAILY FOR CHOLESTEROL    Current Outpatient Medications (Hematological):    Cyanocobalamin (B-12) 100 MCG TABS, Take by mouth.  Current Outpatient Medications (Other):    ALPRAZolam (XANAX) 0.25 MG tablet, Take 1/2 to 1 tablet 2 x / day if needed for anxiety. (Patient taking differently: Take 0.25 mg by mouth 2 (two) times daily.)   B Complex-Biotin-FA (B-100 COMPLEX PO), Take by  mouth.   BIOTIN PO, Take 1 tablet by mouth daily.   Cholecalciferol (VITAMIN D) 50 MCG (2000 UT) CAPS, Take 2,000 Units by mouth daily.   Magnesium 500 MG TABS, Take 500 mg by mouth daily.   milk thistle 175 MG tablet, Take 175 mg by mouth daily.   mometasone (ELOCON) 0.1 % ointment, Apply topically two to three times weekly.   Omega-3 Fatty Acids (FISH OIL OMEGA-3 PO), Take 1 capsule by mouth daily.   ONETOUCH DELICA LANCETS 33G MISC, Check blood sugar 1 time daily-DX-E11.22   ONETOUCH VERIO test strip, USE TO CHECK BLOOD SUGAR ONCE DAILY   TURMERIC PO, Take by mouth.   venlafaxine XR (EFFEXOR-XR) 150 MG 24 hr capsule, Take 150 mg by mouth 2 (two) times daily.    vitamin C (ASCORBIC ACID) 500 MG tablet, Take 500 mg by mouth daily.   vitamin E 180 MG (400 UNITS) capsule, Take 400 Units by mouth daily.   zaleplon (SONATA) 10 MG capsule, Take 10 mg by mouth at bedtime as needed for sleep.   zinc gluconate 50 MG tablet, Take 50 mg by mouth daily.   famotidine (PEPCID) 10 MG tablet, Take 10 mg by mouth at bedtime as needed for heartburn. (Patient not taking: Reported on 09/03/2022)   linaclotide (LINZESS) 72 MCG capsule, Take 1 capsule (72 mcg total) by mouth daily before breakfast. (Patient not taking: Reported on 09/03/2022)   LINZESS 145 MCG CAPS capsule, Take 145 mcg by mouth every other day. (Patient not taking: Reported on 09/03/2022)   terbinafine (LAMISIL) 250 MG tablet, TAKE 1 TABLET BY MOUTH EVERY DAY FOR ONE MONTH ON AND 1 MONTH OFF FOR NAIL FUNGUS. SKIP EVERY OTHER MONTH. (Patient not taking: Reported on 09/03/2022)   Allergies  Allergen Reactions   Penicillins Anaphylaxis and Rash        Sulfa Antibiotics Shortness Of Breath and Rash    Also light headed   Hydrocodone Nausea And Vomiting   Celebrex [Celecoxib] Other (See Comments)    Elevates Blood Pressure    Ciprofloxacin Nausea And Vomiting   Codeine Nausea And Vomiting   Levaquin [Levofloxacin] Nausea And Vomiting   Motrin  Ib [Ibuprofen] Hives   Prempro [Conj Estrog-Medroxyprogest Ace] Hives    Current Problems (verified) Patient Active Problem List   Diagnosis Date Noted   Right rotator cuff tear 09/06/2021   History of adenomatous polyp of colon 08/14/2021   Lichen sclerosus 01/05/2021   Hepatic steatosis 02/07/2020   OAB (overactive bladder) 02/07/2020   CKD stage 2 due to type 2 diabetes mellitus (HCC) 02/06/2020   Aortic atherosclerosis by CTscan Jan 2021 04/01/2019   Overweight (BMI 25.0-29.9) 01/25/2019   S/P laparoscopic cholecystectomy 12/08/2018   Hyperlipidemia associated with type 2 diabetes mellitus (HCC) 06/27/2018   FHx: heart disease 02/14/2018   Osteopenia 11/09/2017  Seasonal allergies 12/15/2016   COPD (chronic obstructive pulmonary disease) (HCC) 12/20/2014   OSA and COPD overlap syndrome (HCC) 11/19/2014   GERD  11/19/2014   Medication management 09/27/2013   T2_NIDDM w/CKD 2  (HCC)    Vitamin D deficiency    Iron deficiency anemia 06/17/2012   Smoker 06/16/2012   Essential hypertension 06/02/2012   Depression, major, 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   IBS (irritable bowel syndrome) 02/07/1988   Bell's palsy 01/07/1985    Screening Tests Immunization History  Administered Date(s) Administered   DT (Pediatric) 05/21/2015   Fluad Quad(high Dose 65+) 12/17/2021   Influenza, High Dose Seasonal PF 01/26/2019, 12/08/2019   Influenza,inj,Quad PF,6+ Mos 12/02/2016   Influenza,inj,quad, With Preservative 12/09/2015   Influenza-Unspecified 12/07/2016, 12/11/2020   Moderna Sars-Covid-2 Vaccination 04/24/2019, 05/23/2019, 01/17/2020   PPD Test 03/21/2013, 05/21/2015   Pneumococcal Conjugate-13 01/04/2014   Pneumococcal Polysaccharide-23 03/09/2004, 11/11/2017   Td 03/09/2004   Tdap 03/10/2015   Typhoid Inactivated 05/10/2014   Zoster Recombinat (Shingrix) 05/21/2020, 07/21/2020   Zoster, Live 12/22/2012    Health Maintenance  Topic Date Due   COVID-19 Vaccine (4 - 2023-24  season) 09/19/2022 (Originally 11/07/2021)   OPHTHALMOLOGY EXAM  09/11/2022   INFLUENZA VACCINE  10/08/2022   Diabetic kidney evaluation - Urine ACR  11/21/2022   HEMOGLOBIN A1C  11/21/2022   FOOT EXAM  02/03/2023   Lung Cancer Screening  03/07/2023   Diabetic kidney evaluation - eGFR measurement  05/21/2023   Medicare Annual Wellness (AWV)  09/03/2023   MAMMOGRAM  11/29/2023   DTaP/Tdap/Td (4 - Td or Tdap) 05/20/2025   Colonoscopy  06/28/2026   Pneumonia Vaccine 22+ Years old  Completed   DEXA SCAN  Completed   Hepatitis C Screening  Completed   Zoster Vaccines- Shingrix  Completed   HPV VACCINES  Aged Out     Names of Other Physician/Practitioners you currently use: 1. [facility] here for primary care 2. Dr. Emily Filbert, eye doctor, last visit 2023 3. Dr. Tresa Moore, dentist, last visit 2023  Patient Care Team: Lucky Cowboy, MD as PCP - General (Internal Medicine) Charna Elizabeth, MD as Consulting Physician (Gastroenterology)  SURGICAL HISTORY She  has a past surgical history that includes Colonoscopy (05/2005); Total knee arthroplasty (Left, 2012); Bladder suspension (03/2006, 07/2006, 01/2007); Total knee revision (2014); Tonsillectomy and adenoidectomy; Lumbar fusion (Right, 09/2016); Cholecystectomy (N/A, 12/08/2018); Total knee revision (Left, 08/2018); Colonoscopy with propofol (N/A, 06/27/2021); polypectomy (06/27/2021); and Hemostasis clip placement (06/27/2021). FAMILY HISTORY Her family history includes Dementia in her mother; Diabetes in her father; Heart attack (age of onset: 17) in her paternal grandfather; Hypertension in her father; Parkinson's disease in her father. SOCIAL HISTORY She  reports that she has been smoking cigarettes. She started smoking about 51 years ago. She has a 35.25 pack-year smoking history. She has never used smokeless tobacco. She reports that she does not drink alcohol and does not use drugs.  MEDICARE WELLNESS  OBJECTIVES: Physical activity:   Cardiac risk factors: Cardiac Risk Factors include: advanced age (>22men, >32 women);smoking/ tobacco exposure;sedentary lifestyle;dyslipidemia;diabetes mellitus Depression/mood screen:      09/03/2022   12:04 PM  Depression screen PHQ 2/9  Decreased Interest 0  Down, Depressed, Hopeless 0  PHQ - 2 Score 0    ADLs:     09/03/2022   11:59 AM 05/21/2022    4:18 PM  In your present state of health, do you have any difficulty performing the following activities:  Hearing? 0 0  Vision? 0 0  Difficulty concentrating or making decisions? 0 0  Walking or climbing stairs? 1 0  Dressing or bathing? 0 0  Doing errands, shopping? 1 0     Cognitive Testing  Alert? Yes  Normal Appearance?Yes  Oriented to person? Yes  Place? Yes   Time? Yes  Recall of three objects?  1  Can perform simple calculations? Yes  Displays appropriate judgment?Yes  Can read the correct time from a watch face?Yes  EOL planning: Does Patient Have a Medical Advance Directive?: Yes Type of Advance Directive: Healthcare Power of Attorney, Living will Does patient want to make changes to medical advance directive?: No - Patient declined Copy of Healthcare Power of Attorney in Chart?: No - copy requested     09/03/2022   12:11 PM  MMSE - Mini Mental State Exam  Orientation to time 5  Orientation to Place 5  Registration 3  Attention/ Calculation 5  Recall 1  Language- name 2 objects 2  Language- repeat 1  Language- follow 3 step command 3  Language- read & follow direction 1  Write a sentence 1  Copy design 1  Total score 28       Review of Systems  Constitutional:  Negative for malaise/fatigue and weight loss.  HENT:  Negative for congestion, hearing loss and tinnitus.   Eyes:  Negative for blurred vision and double vision.  Respiratory:  Negative for cough, sputum production, shortness of breath and wheezing.   Cardiovascular:  Negative for chest pain, palpitations,  orthopnea, claudication, leg swelling and PND.  Gastrointestinal:  Negative for abdominal pain, blood in stool, constipation, diarrhea, heartburn, melena, nausea and vomiting.  Genitourinary:  Negative for dysuria, flank pain, frequency, hematuria and urgency.  Musculoskeletal:  Positive for falls (unsteady gait due to unequal leg lenghts, has done PT) and joint pain (left knee). Negative for myalgias.  Skin:  Negative for rash.  Neurological:  Negative for dizziness, tingling, sensory change, weakness and headaches.  Endo/Heme/Allergies:  Negative for polydipsia.  Psychiatric/Behavioral:  Positive for depression and memory loss (mild memory changes). Negative for substance abuse and suicidal ideas. The patient is not nervous/anxious and does not have insomnia.   All other systems reviewed and are negative.    Objective:     Today's Vitals   09/03/22 1130  BP: 134/80  Pulse: 79  Temp: (!) 97.5 F (36.4 C)  SpO2: 95%  Weight: 168 lb (76.2 kg)  Height: 5\' 5"  (1.651 m)   Body mass index is 27.96 kg/m. General Appearance: Well nourished, in no apparent distress. Eyes: PERRLA, EOMs, conjunctiva no swelling or erythema Sinuses: No Frontal/maxillary tenderness ENT/Mouth: Ext aud canals clear, TMs without erythema, bulging. No erythema, swelling, or exudate on post pharynx.  Hearing normal.  Neck: Supple, thyroid normal.  Respiratory: Respiratory effort normal, BS equal bilaterally without rales, rhonchi, wheezing or stridor.  Cardio: RRR with no MRGs. Brisk peripheral pulses without edema.  Abdomen: Soft, + BS.  Non tender, no guarding, rebound, hernias, masses. Lymphatics: Non tender without lymphadenopathy.  Musculoskeletal: Full ROM, 4/5 strength left leg, Antalgic gait with walker Skin: Warm, dry without rashes, lesions, ecchymosis.  Neuro: Cranial nerves intact. No cerebellar symptoms.  Psych: Awake and oriented X 3, normal affect, Insight and Judgment appropriate.   Medicare  Attestation I have personally reviewed: The patient's medical and social history Their use of alcohol, tobacco or illicit drugs Their current medications and supplements The patient's functional ability including ADLs,fall risks, home safety risks,  cognitive, and hearing and visual impairment Diet and physical activities Evidence for depression or mood disorders  The patient's weight, height, BMI, and visual acuity have been recorded in the chart.  I have made referrals, counseling, and provided education to the patient based on review of the above and I have provided the patient with a written personalized care plan for preventive services.     Raynelle Dick, NP   09/03/2022

## 2022-09-03 ENCOUNTER — Ambulatory Visit (INDEPENDENT_AMBULATORY_CARE_PROVIDER_SITE_OTHER): Payer: Medicare Other | Admitting: Nurse Practitioner

## 2022-09-03 ENCOUNTER — Encounter: Payer: Self-pay | Admitting: Nurse Practitioner

## 2022-09-03 VITALS — BP 134/80 | HR 79 | Temp 97.5°F | Ht 65.0 in | Wt 168.0 lb

## 2022-09-03 DIAGNOSIS — R413 Other amnesia: Secondary | ICD-10-CM

## 2022-09-03 DIAGNOSIS — M8580 Other specified disorders of bone density and structure, unspecified site: Secondary | ICD-10-CM

## 2022-09-03 DIAGNOSIS — F325 Major depressive disorder, single episode, in full remission: Secondary | ICD-10-CM

## 2022-09-03 DIAGNOSIS — S72415D Nondisplaced unspecified condyle fracture of lower end of left femur, subsequent encounter for closed fracture with routine healing: Secondary | ICD-10-CM

## 2022-09-03 DIAGNOSIS — I771 Stricture of artery: Secondary | ICD-10-CM | POA: Diagnosis not present

## 2022-09-03 DIAGNOSIS — K589 Irritable bowel syndrome without diarrhea: Secondary | ICD-10-CM

## 2022-09-03 DIAGNOSIS — J302 Other seasonal allergic rhinitis: Secondary | ICD-10-CM

## 2022-09-03 DIAGNOSIS — E1122 Type 2 diabetes mellitus with diabetic chronic kidney disease: Secondary | ICD-10-CM | POA: Diagnosis not present

## 2022-09-03 DIAGNOSIS — R6889 Other general symptoms and signs: Secondary | ICD-10-CM

## 2022-09-03 DIAGNOSIS — E663 Overweight: Secondary | ICD-10-CM

## 2022-09-03 DIAGNOSIS — E662 Morbid (severe) obesity with alveolar hypoventilation: Secondary | ICD-10-CM

## 2022-09-03 DIAGNOSIS — E785 Hyperlipidemia, unspecified: Secondary | ICD-10-CM

## 2022-09-03 DIAGNOSIS — G4733 Obstructive sleep apnea (adult) (pediatric): Secondary | ICD-10-CM

## 2022-09-03 DIAGNOSIS — Z79899 Other long term (current) drug therapy: Secondary | ICD-10-CM

## 2022-09-03 DIAGNOSIS — Z0000 Encounter for general adult medical examination without abnormal findings: Secondary | ICD-10-CM

## 2022-09-03 DIAGNOSIS — J449 Chronic obstructive pulmonary disease, unspecified: Secondary | ICD-10-CM

## 2022-09-03 DIAGNOSIS — J439 Emphysema, unspecified: Secondary | ICD-10-CM

## 2022-09-03 DIAGNOSIS — N182 Chronic kidney disease, stage 2 (mild): Secondary | ICD-10-CM

## 2022-09-03 DIAGNOSIS — E1169 Type 2 diabetes mellitus with other specified complication: Secondary | ICD-10-CM

## 2022-09-03 DIAGNOSIS — I700 Atherosclerosis of aorta: Secondary | ICD-10-CM

## 2022-09-03 DIAGNOSIS — F17200 Nicotine dependence, unspecified, uncomplicated: Secondary | ICD-10-CM

## 2022-09-03 DIAGNOSIS — I10 Essential (primary) hypertension: Secondary | ICD-10-CM

## 2022-09-03 DIAGNOSIS — Z0001 Encounter for general adult medical examination with abnormal findings: Secondary | ICD-10-CM

## 2022-09-03 DIAGNOSIS — K5900 Constipation, unspecified: Secondary | ICD-10-CM

## 2022-09-03 DIAGNOSIS — R2681 Unsteadiness on feet: Secondary | ICD-10-CM

## 2022-09-03 DIAGNOSIS — B351 Tinea unguium: Secondary | ICD-10-CM

## 2022-09-03 DIAGNOSIS — K219 Gastro-esophageal reflux disease without esophagitis: Secondary | ICD-10-CM

## 2022-09-03 DIAGNOSIS — E559 Vitamin D deficiency, unspecified: Secondary | ICD-10-CM

## 2022-09-03 LAB — CBC WITH DIFFERENTIAL/PLATELET
MCHC: 32.6 g/dL (ref 32.0–36.0)
MPV: 11.4 fL (ref 7.5–12.5)
Monocytes Relative: 7.8 %
RBC: 4.33 10*6/uL (ref 3.80–5.10)
Total Lymphocyte: 24.4 %
WBC: 5.9 10*3/uL (ref 3.8–10.8)

## 2022-09-03 MED ORDER — LINACLOTIDE 72 MCG PO CAPS
72.0000 ug | ORAL_CAPSULE | Freq: Every day | ORAL | 2 refills | Status: DC
Start: 2022-09-03 — End: 2022-12-11

## 2022-09-03 MED ORDER — TERBINAFINE HCL 250 MG PO TABS: ORAL_TABLET | ORAL | 0 refills | Status: DC

## 2022-09-03 NOTE — Patient Instructions (Signed)

## 2022-09-04 LAB — HEMOGLOBIN A1C
Hgb A1c MFr Bld: 5.6 % of total Hgb (ref <5.7)
Mean Plasma Glucose: 114 mg/dL
eAG (mmol/L): 6.3 mmol/L

## 2022-09-04 LAB — CBC WITH DIFFERENTIAL/PLATELET
Absolute Monocytes: 460 cells/uL (ref 200–950)
Basophils Absolute: 30 cells/uL (ref 0–200)
Basophils Relative: 0.5 %
Eosinophils Absolute: 201 cells/uL (ref 15–500)
Eosinophils Relative: 3.4 %
HCT: 38.9 % (ref 35.0–45.0)
Hemoglobin: 12.7 g/dL (ref 11.7–15.5)
Lymphs Abs: 1440 cells/uL (ref 850–3900)
MCH: 29.3 pg (ref 27.0–33.0)
MCV: 89.8 fL (ref 80.0–100.0)
Neutro Abs: 3770 cells/uL (ref 1500–7800)
Neutrophils Relative %: 63.9 %
Platelets: 172 10*3/uL (ref 140–400)
RDW: 13.4 % (ref 11.0–15.0)

## 2022-09-04 LAB — COMPLETE METABOLIC PANEL WITH GFR
AG Ratio: 1.3 (calc) (ref 1.0–2.5)
ALT: 21 U/L (ref 6–29)
AST: 28 U/L (ref 10–35)
Albumin: 3.5 g/dL — ABNORMAL LOW (ref 3.6–5.1)
Alkaline phosphatase (APISO): 133 U/L (ref 37–153)
BUN: 16 mg/dL (ref 7–25)
CO2: 28 mmol/L (ref 20–32)
Calcium: 9 mg/dL (ref 8.6–10.4)
Chloride: 107 mmol/L (ref 98–110)
Creat: 0.69 mg/dL (ref 0.50–1.05)
Globulin: 2.7 g/dL (calc) (ref 1.9–3.7)
Glucose, Bld: 78 mg/dL (ref 65–99)
Potassium: 4.1 mmol/L (ref 3.5–5.3)
Sodium: 143 mmol/L (ref 135–146)
Total Bilirubin: 0.4 mg/dL (ref 0.2–1.2)
Total Protein: 6.2 g/dL (ref 6.1–8.1)
eGFR: 94 mL/min/{1.73_m2} (ref >=60)

## 2022-09-04 LAB — LIPID PANEL
Cholesterol: 132 mg/dL (ref <200)
HDL: 49 mg/dL — ABNORMAL LOW (ref >=50)
LDL Cholesterol (Calc): 69 mg/dL (calc)
Non-HDL Cholesterol (Calc): 83 mg/dL (calc) (ref <130)
Total CHOL/HDL Ratio: 2.7 (calc) (ref <5.0)
Triglycerides: 49 mg/dL (ref <150)

## 2022-09-16 ENCOUNTER — Encounter: Payer: Self-pay | Admitting: Neurology

## 2022-09-16 ENCOUNTER — Encounter: Payer: Self-pay | Admitting: Internal Medicine

## 2022-09-16 LAB — HM DIABETES EYE EXAM

## 2022-09-17 ENCOUNTER — Encounter: Payer: Self-pay | Admitting: Internal Medicine

## 2022-09-17 NOTE — Addendum Note (Signed)
Addended by: Anda Kraft E on: 09/17/2022 03:36 PM   Modules accepted: Orders

## 2022-09-18 ENCOUNTER — Other Ambulatory Visit: Payer: Self-pay | Admitting: Neurology

## 2022-09-18 DIAGNOSIS — G309 Alzheimer's disease, unspecified: Secondary | ICD-10-CM

## 2022-09-18 NOTE — Telephone Encounter (Signed)
MRI was within normal limits, we will obtain a pet amyloid.

## 2022-09-21 ENCOUNTER — Telehealth: Payer: Self-pay | Admitting: Neurology

## 2022-09-21 NOTE — Telephone Encounter (Signed)
[employer] medicare Berkley Harvey: [id number] exp. 09/21/22-11/05/22 sent to [facility] [phone]

## 2022-10-08 ENCOUNTER — Encounter (HOSPITAL_COMMUNITY)
Admission: RE | Admit: 2022-10-08 | Discharge: 2022-10-08 | Disposition: A | Discharge status: Home / Self Care | Payer: Medicare Other | Source: Ambulatory Visit | Attending: Neurology | Admitting: Neurology

## 2022-10-08 DIAGNOSIS — G309 Alzheimer's disease, unspecified: Secondary | ICD-10-CM | POA: Insufficient documentation

## 2022-10-08 MED ORDER — FLORBETAPIR F 18 500-1900 MBQ/ML IV SOLN
9.7000 | Freq: Once | INTRAVENOUS | Status: AC
  Administered 2022-10-08: 9.7 via INTRAVENOUS

## 2022-10-27 ENCOUNTER — Ambulatory Visit: Payer: Medicare Other | Admitting: Neurology

## 2022-11-25 ENCOUNTER — Encounter: Payer: Medicare Other | Admitting: Internal Medicine

## 2022-12-11 ENCOUNTER — Other Ambulatory Visit: Payer: Self-pay | Admitting: Nurse Practitioner

## 2022-12-11 DIAGNOSIS — K5900 Constipation, unspecified: Secondary | ICD-10-CM

## 2022-12-11 LAB — HM MAMMOGRAPHY

## 2022-12-15 ENCOUNTER — Telehealth: Payer: Self-pay

## 2022-12-15 ENCOUNTER — Ambulatory Visit (INDEPENDENT_AMBULATORY_CARE_PROVIDER_SITE_OTHER): Payer: Medicare Other | Admitting: Neurology

## 2022-12-15 ENCOUNTER — Encounter: Payer: Self-pay | Admitting: Neurology

## 2022-12-15 VITALS — BP 134/81 | HR 89 | Ht 65.0 in | Wt 165.0 lb

## 2022-12-15 DIAGNOSIS — G3184 Mild cognitive impairment, so stated: Secondary | ICD-10-CM

## 2022-12-15 DIAGNOSIS — G309 Alzheimer's disease, unspecified: Secondary | ICD-10-CM

## 2022-12-15 NOTE — Progress Notes (Signed)
[facility]  PATIENT: Beth [name] DOB: 01-12-1951  REQUESTING CLINICIAN: Lucky Cowboy, MD HISTORY FROM: Patient  REASON FOR VISIT: Memory loss    HISTORICAL  CHIEF COMPLAINT:  Chief Complaint  Patient presents with   Follow-up    Rm12, friend present, discuss PET scan results and tx options   INTERVAL HISTORY 12/15/2022 Patient presents today for follow-up, last visit was in May, at that time we obtained a ATN profile which was positive for Alzheimer disease biomarker, she did have a amyloid PET scan which was positive, showed moderate to frequent amyloid plaque.  She is interested in the new medication such as Leqembi and wanted to get more information.   HISTORY OF PRESENT ILLNESS:  This is a 72 year old woman with past medical history of anxiety, depression, hyperlipidemia who is presenting per her daughter request for memory problem.  Patient feels like her memory is fine, she is forgetful but independent in all activities of daily living, she has trouble with names but otherwise she is fine.  She reports that her daughter has complained about her memory, she said that patient asked the same questions, about the same thing and at one point she even thought her mother needed to be moved to a nursing home which made patient very upset.  Patient reports that she lives alone, she cooks very little and use a lot of microwave food.  She does some cleaning, has a housekeeper and also does online shopping.  She does not have any issue with self-care.  She manages her bills online.  She drives, denies any accident in any denies being lost in familiar places.   TBI:   No past history of TBI Stroke:   no past history of stroke Seizures:   no past history of seizures Sleep:  Previous history but improved after weight loss. Mood: Yes, both anxiety and depression Family history of Dementia: Both parents with dementia but at a later age (in their 1)  Functional  status: independent in all ADLs and IADLs Patient lives alone. Cooking: very little cooking, microwave Cleaning: some, has a Water quality scientist: yes, online shopping  Bathing: no issues  Toileting: no issues  Driving: yes, no recent accident Bills: Online payment   Ever left the stove on by accident?: Denies  Forget how to use items around the house?: Denies  Getting lost going to familiar places?: Denies  Forgetting loved ones names?: Denies  Word finding difficulty? Yes Sleep: Good    OTHER MEDICAL CONDITIONS: Anxiety/Depression, hyperlipidemia,   REVIEW OF SYSTEMS: Full 14 system review of systems performed and negative with exception of: As noted in the HPI   ALLERGIES: Allergies  Allergen Reactions   Penicillins Anaphylaxis and Rash        Sulfa Antibiotics Shortness Of Breath and Rash    Also light headed   Hydrocodone Nausea And Vomiting   Celebrex [Celecoxib] Other (See Comments)    Elevates Blood Pressure    Ciprofloxacin Nausea And Vomiting   Codeine Nausea And Vomiting   Levaquin [Levofloxacin] Nausea And Vomiting   Motrin Ib [Ibuprofen] Hives   Prempro [Conj Estrog-Medroxyprogest Ace] Hives    HOME MEDICATIONS: Outpatient Medications Prior to Visit  Medication Sig Dispense Refill   ALPRAZolam (XANAX) 0.25 MG tablet Take 1/2 to 1 tablet 2 x / day if needed for anxiety. (Patient taking differently: Take 0.25 mg by mouth 2 (two) times daily.) 60 tablet 0   B Complex-Biotin-FA (B-100 COMPLEX PO) Take by  mouth.     BIOTIN PO Take 1 tablet by mouth daily.     Cholecalciferol (VITAMIN D) 50 MCG (2000 UT) CAPS Take 2,000 Units by mouth daily.     Cyanocobalamin (B-12) 100 MCG TABS Take by mouth.     LINZESS 72 MCG capsule TAKE 1 CAPSULE(72 MCG) BY MOUTH DAILY BEFORE BREAKFAST 30 capsule 2   Magnesium 500 MG TABS Take 500 mg by mouth daily.     milk thistle 175 MG tablet Take 175 mg by mouth daily.     mometasone (ELOCON) 0.1 % ointment Apply topically two to  three times weekly. 45 g 2   Omega-3 Fatty Acids (FISH OIL OMEGA-3 PO) Take 1 capsule by mouth daily.     rosuvastatin (CRESTOR) 10 MG tablet TAKE 1 TABLET BY MOUTH DAILY FOR CHOLESTEROL 90 tablet 3   terbinafine (LAMISIL) 250 MG tablet TAKE 1 TABLET BY MOUTH EVERY DAY FOR ONE MONTH ON AND 1 MONTH OFF FOR NAIL FUNGUS. SKIP EVERY OTHER MONTH. 90 tablet 0   TURMERIC PO Take by mouth.     venlafaxine XR (EFFEXOR-XR) 150 MG 24 hr capsule Take 150 mg by mouth 2 (two) times daily.      vitamin C (ASCORBIC ACID) 500 MG tablet Take 500 mg by mouth daily.     vitamin E 180 MG (400 UNITS) capsule Take 400 Units by mouth daily.     zinc gluconate 50 MG tablet Take 50 mg by mouth daily.     ONETOUCH DELICA LANCETS 33G MISC Check blood sugar 1 time daily-DX-E11.22 100 each 4   No facility-administered medications prior to visit.    PAST MEDICAL HISTORY: Past Medical History:  Diagnosis Date   Abscessed tooth    Anemia    Anxiety    Arthritis    Bell's palsy 01/1985   COPD (chronic obstructive pulmonary disease) (HCC)    Depression    Diabetes mellitus without complication (HCC)    Fibroid uterus 12/20/2014   7cm, 3.4cm and 1.9cm fibroids    Gallstones    GERD (gastroesophageal reflux disease)    Hyperlipidemia    Hypertension    IBS (irritable bowel syndrome) 02/1988   Obesity hypoventilation syndrome (HCC) 11/19/2014   Obesity, Class III, BMI 40-49.9 (morbid obesity) (HCC)    OSA (obstructive sleep apnea)    no cpap   Osteopenia    Vitamin D deficiency     PAST SURGICAL HISTORY: Past Surgical History:  Procedure Laterality Date   BLADDER SUSPENSION  03/2006, 07/2006, 01/2007   for stress incontinence   CHOLECYSTECTOMY N/A 12/08/2018   Procedure: LAPAROSCOPIC CHOLECYSTECTOMY;  Surgeon: Abigail Miyamoto, MD;  Location: [facility] OR;  Service: General;  Laterality: N/A;   COLONOSCOPY  05/2005   Normal, recheck 10 yrs   COLONOSCOPY WITH PROPOFOL N/A 06/27/2021   Procedure: COLONOSCOPY WITH  PROPOFOL;  Surgeon: Jeani Hawking, MD;  Location: [facility];  Service: Endoscopy;  Laterality: N/A;   HEMOSTASIS CLIP PLACEMENT  06/27/2021   Procedure: HEMOSTASIS CLIP PLACEMENT;  Surgeon: Jeani Hawking, MD;  Location: [facility];  Service: Endoscopy;;   LUMBAR FUSION Right 09/2016   L4-5 fusion by Dr. Marikay Alar   POLYPECTOMY  06/27/2021   Procedure: POLYPECTOMY;  Surgeon: Jeani Hawking, MD;  Location: [facility];  Service: Endoscopy;;   TONSILLECTOMY AND ADENOIDECTOMY     TOTAL KNEE ARTHROPLASTY Left 2012   x 2 left knee   TOTAL KNEE REVISION  2014   TOTAL KNEE REVISION Left 08/2018  Dr Brandy Hale, [facility]    FAMILY HISTORY: Family History  Problem Relation Age of Onset   Dementia Mother    Hypertension Father    Diabetes Father    Parkinson's disease Father    Heart attack Paternal Grandfather 63       Died MI   Colon cancer Neg Hx    Esophageal cancer Neg Hx    Pancreatic cancer Neg Hx    Stomach cancer Neg Hx     SOCIAL HISTORY: Social History   Socioeconomic History   Marital status: Divorced    Spouse name: Not on file   Number of children: 1   Years of education: Not on file   Highest education level: Not on file  Occupational History    Employer: RETIRED  Tobacco Use   Smoking status: Every Day    Current packs/day: 0.75    Average packs/day: 0.8 packs/day for 51.8 years (38.8 ttl pk-yrs)    Types: Cigarettes    Start date: 03/10/1971   Smokeless tobacco: Never   Tobacco comments:    1 pack/day from age [age], now down to 0.5  Vaping Use   Vaping status: Never Used  Substance and Sexual Activity   Alcohol use: No   Drug use: No   Sexual activity: Not Currently    Partners: Male    Birth control/protection: Post-menopausal  Other Topics Concern   Not on file  Social History Narrative   Lives alone.     Social Determinants of Health   Financial Resource Strain: Not on file  Food Insecurity: Not on file  Transportation Needs: Not on  file  Physical Activity: Not on file  Stress: Not on file  Social Connections: Not on file  Intimate Partner Violence: Not on file    PHYSICAL EXAM  GENERAL EXAM/CONSTITUTIONAL: Vitals:  Vitals:   12/15/22 1509  BP: 134/81  Pulse: 89  Weight: 165 lb (74.8 kg)  Height: 5\' 5"  (1.651 m)    Body mass index is 27.46 kg/m. Wt Readings from Last 3 Encounters:  12/15/22 165 lb (74.8 kg)  09/03/22 168 lb (76.2 kg)  07/23/22 169 lb 8 oz (76.9 kg)   Patient is in no distress; well developed, nourished and groomed; neck is supple  MUSCULOSKELETAL: Gait, strength, tone, movements noted in Neurologic exam below  NEUROLOGIC: MENTAL STATUS:     09/03/2022   12:11 PM  MMSE - Mini Mental State Exam  Orientation to time 5  Orientation to Place 5  Registration 3  Attention/ Calculation 5  Recall 1  Language- name 2 objects 2  Language- repeat 1  Language- follow 3 step command 3  Language- read & follow direction 1  Write a sentence 1  Copy design 1  Total score 28      07/23/2022    1:00 PM 07/23/2022   12:56 PM  Montreal Cognitive Assessment   Visuospatial/ Executive (0/5) 5 5  Naming (0/3) 3 3  Attention: Read list of digits (0/2) 2 2  Attention: Read list of letters (0/1) 1 1  Attention: Serial 7 subtraction starting at 100 (0/3) 3   Language: Repeat phrase (0/2) 2   Language : Fluency (0/1) 1   Abstraction (0/2) 2   Delayed Recall (0/5) 1   Orientation (0/6) 6   Total 26   Adjusted Score (based on education) 26      CRANIAL NERVE:  2nd, 3rd, 4th, 6th- visual fields full to confrontation, extraocular muscles intact, no  nystagmus 5th - facial sensation symmetric 7th - facial strength symmetric 8th - hearing intact 9th - palate elevates symmetrically, uvula midline 11th - shoulder shrug symmetric 12th - tongue protrusion midline  MOTOR:  normal bulk and tone, full strength in the BUE, BLE  SENSORY:  normal and symmetric to light touch  COORDINATION:   finger-nose-finger, fine finger movements normal  GAIT/STATION:  Walk with a walker      DIAGNOSTIC DATA (LABS, IMAGING, TESTING) - I reviewed patient records, labs, notes, testing and imaging myself where available.  Lab Results  Component Value Date   WBC 5.9 09/03/2022   HGB 12.7 09/03/2022   HCT 38.9 09/03/2022   MCV 89.8 09/03/2022   PLT 172 09/03/2022      Component Value Date/Time   NA 143 09/03/2022 1225   K 4.1 09/03/2022 1225   CL 107 09/03/2022 1225   CO2 28 09/03/2022 1225   GLUCOSE 78 09/03/2022 1225   BUN 16 09/03/2022 1225   CREATININE 0.69 09/03/2022 1225   CALCIUM 9.0 09/03/2022 1225   PROT 6.2 09/03/2022 1225   ALBUMIN 3.7 10/14/2018 1316   AST 28 09/03/2022 1225   ALT 21 09/03/2022 1225   ALKPHOS 150 (H) 10/14/2018 1316   BILITOT 0.4 09/03/2022 1225   GFRNONAA 79 05/14/2020 1428   GFRAA 91 05/14/2020 1428   Lab Results  Component Value Date   CHOL 132 09/03/2022   HDL 49 (L) 09/03/2022   LDLCALC 69 09/03/2022   TRIG 49 09/03/2022   CHOLHDL 2.7 09/03/2022   Lab Results  Component Value Date   HGBA1C 5.6 09/03/2022   Lab Results  Component Value Date   VITAMINB12 322 07/05/2019   Lab Results  Component Value Date   TSH 2.92 05/21/2022   ATN Profile positive for Alzheimer disease biomarkers  MRI Brain 09/02/2022 - Mild periventricular and subcortical and pontine foci of chronic small vessel ischemic disease. - No acute findings.  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 year old female with anxiety depression, hyperlipidemia who is presenting for treatment of her mild cognitive impairment due to Alzheimer disease.  We discussed treatment plan including Aricept, Leqembi and Kisunla.  We Discussed with Leqembi and Kisunla, there are side Effect Including Brain Bleed and Brain Swelling.   On Top of That She wont be Able to Get Any Thrombolytic in the Case That She Had a Stroke.  We Also Informed Her That We Will Continue Monitor her brain by obtaining Serial MRIs.  We Also Discussed the Length of the Treatment Which Is about 18-Months and Patient Voiced Understanding and Commitment to Treatment.  We will obtain ApoE4 genotype and submit paperwork for approval for Leqembi.  I will see the patient in 1 year for follow-up but she understand to contact me for any question or concerns.  1. Mild cognitive impairment due to Alzheimer's disease [facility])       Patient Instructions  ApoE4 genotype Will submit paperwork for Leqembi infusion appointment Follow-up in 1 year or sooner if worse.  Orders Placed This Encounter  Procedures   APOE Alzheimer's Risk    No orders of the defined types were placed in this encounter.   Return in about 1 year (around 12/15/2023).  I have spent a total of 45 minutes dedicated to this patient today, preparing to see patient, performing a  medically appropriate examination and evaluation, ordering tests and/or medications and procedures, and counseling and educating the patient/family/caregiver; independently interpreting result and communicating results to the family/patient/caregiver; and documenting clinical information in the electronic medical record.   Windell Norfolk, MD 12/15/2022, 3:51 PM  [facility] [address] [place], [place] [address] [phone]

## 2022-12-15 NOTE — Patient Instructions (Addendum)
ApoE4 genotype Will submit paperwork for Leqembi infusion appointment Follow-up in 1 year or sooner if worse.

## 2022-12-15 NOTE — Telephone Encounter (Signed)
Called and spoke to pt and asked them to come in for labs required to start infusion process. Pt voiced gratitude and understanding.

## 2022-12-15 NOTE — Addendum Note (Signed)
Addended byWindell Norfolk on: 12/15/2022 04:59 PM   Modules accepted: Orders

## 2022-12-17 ENCOUNTER — Other Ambulatory Visit (INDEPENDENT_AMBULATORY_CARE_PROVIDER_SITE_OTHER): Payer: Self-pay

## 2022-12-17 DIAGNOSIS — Z0289 Encounter for other administrative examinations: Secondary | ICD-10-CM

## 2022-12-17 DIAGNOSIS — G309 Alzheimer's disease, unspecified: Secondary | ICD-10-CM

## 2022-12-24 ENCOUNTER — Encounter: Payer: Self-pay | Admitting: Internal Medicine

## 2022-12-24 ENCOUNTER — Ambulatory Visit (INDEPENDENT_AMBULATORY_CARE_PROVIDER_SITE_OTHER): Payer: Medicare Other | Admitting: Internal Medicine

## 2022-12-24 VITALS — BP 130/78 | HR 98 | Temp 97.9°F | Resp 17 | Ht 65.0 in | Wt 164.8 lb

## 2022-12-24 DIAGNOSIS — Z0001 Encounter for general adult medical examination with abnormal findings: Secondary | ICD-10-CM

## 2022-12-24 DIAGNOSIS — Z0000 Encounter for general adult medical examination without abnormal findings: Secondary | ICD-10-CM | POA: Diagnosis not present

## 2022-12-24 DIAGNOSIS — Z136 Encounter for screening for cardiovascular disorders: Secondary | ICD-10-CM | POA: Diagnosis not present

## 2022-12-24 DIAGNOSIS — E559 Vitamin D deficiency, unspecified: Secondary | ICD-10-CM

## 2022-12-24 DIAGNOSIS — Z1211 Encounter for screening for malignant neoplasm of colon: Secondary | ICD-10-CM

## 2022-12-24 DIAGNOSIS — I10 Essential (primary) hypertension: Secondary | ICD-10-CM

## 2022-12-24 DIAGNOSIS — Z8249 Family history of ischemic heart disease and other diseases of the circulatory system: Secondary | ICD-10-CM

## 2022-12-24 DIAGNOSIS — F17200 Nicotine dependence, unspecified, uncomplicated: Secondary | ICD-10-CM

## 2022-12-24 DIAGNOSIS — E1122 Type 2 diabetes mellitus with diabetic chronic kidney disease: Secondary | ICD-10-CM

## 2022-12-24 DIAGNOSIS — F325 Major depressive disorder, single episode, in full remission: Secondary | ICD-10-CM

## 2022-12-24 DIAGNOSIS — E1169 Type 2 diabetes mellitus with other specified complication: Secondary | ICD-10-CM

## 2022-12-24 DIAGNOSIS — I700 Atherosclerosis of aorta: Secondary | ICD-10-CM

## 2022-12-24 DIAGNOSIS — Z79899 Other long term (current) drug therapy: Secondary | ICD-10-CM

## 2022-12-24 DIAGNOSIS — J439 Emphysema, unspecified: Secondary | ICD-10-CM

## 2022-12-24 DIAGNOSIS — G4733 Obstructive sleep apnea (adult) (pediatric): Secondary | ICD-10-CM

## 2022-12-24 LAB — APOE ALZHEIMER'S RISK

## 2022-12-24 NOTE — Progress Notes (Signed)
Annual Screening/Preventative Visit & Comprehensive Evaluation &  Examination   Future Appointments  Date Time Provider Department  12/24/2022                       cpe  3:00 PM Lucky Cowboy, MD [facility]  09/08/2023 11:30 AM Raynelle Dick, NP [facility]  12/21/2023  2:45 PM Windell Norfolk, MD [facility]  01/13/2024                       cpe  3:00 PM Lucky Cowboy, MD [facility]        This very nice 72 y.o. 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  Patient has been followed for HTN, HLD, T2_NIDDM and Vitamin D Deficiency. Patient also has GERD controlled on Pepcid, Reglan & Protonix.  Patient has hx/o Major Depression. Patient is on CPAP for OSA with hx/o COPD overlap. Patient is followed in Pain Management clinic by Dr Vear Clock & sees Dr Regino Schultze for EDSI's.   She recently was evaluated by Dr Windell Norfolk for cognitive impairment .        HTN predates since 2007. Patient's BP has been controlled at home and patient denies any cardiac symptoms as chest pain, palpitations, shortness of breath, dizziness or ankle swelling. Today's BP is at goal - 130/78 .        Patient's hyperlipidemia is controlled with diet and medications. Patient denies myalgias or other medication SE's. 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 Last A1c was normal & at goal:  Lab Results  Component Value Date   HGBA1C 5.6 09/03/2022         Finally, patient has history of Vitamin D Deficiency ("13" /2008) and last Vitamin D was at goal:  Lab Results   Component Value Date   VD25OH 75 05/21/2022       Current Outpatient Medications  Medication Instructions   ALPRAZolam  0.25 MG tablet Take 1/2 to 1 tablet 2 x / day if needed    VITAMIN C  500 mg  Daily   B Complex-Biotin-FA /B-100 COMPLEX  Oral   BIOTIN  1 tablet Daily   B-12 100 mcg Oral   LINZESS 72 MCG cap TAKE 1 CAPSULE DAILY    Magnesium500 mg Daily   milk thistle175 mg Daily   mometasone (ELOCON) 0.1 % oint Apply topically two to three times weekly.   Omega-3 FISH OIL OMEGA-3  1 capsule Daily   rosuvastatin 10 MG tablet TAKE 1 TABLET DAILY FOR CHOLESTEROL   terbinafine  250 MG tablet TAKE 1 TABLET  FOR ONE MONTH ON AND 1 MONTH OFF   TURMERIC  daily   venlafaxine XR -XR  150 mg 2 times daily   Vitamin D  2,000 Units Daily   vitamin E  400 Units Daily   zinc  50 mg Daily     Allergies  Allergen Reactions   Penicillins Anaphylaxis and Rash   Sulfa Antibiotics Shortness Of  Breath    Also light headed, rash   Celebrex [Celecoxib] Other (See Comments)    Elevates Blood Pressure   Ciprofloxacin Nausea And Vomiting   Codeine And hydrocodone Nausea And Vomiting   Levaquin [Levofloxacin] Nausea And Vomiting   Motrin Ib [Ibuprofen] Hives   Prempro [Conj Estrog-Medroxyprogest Ace] Hives     Past Medical History:  Diagnosis Date   Abscessed tooth    Anemia    Anxiety    Arthritis    Bell's palsy 01/1985   Complication of anesthesia    COPD (chronic obstructive pulmonary disease) (HCC)    Depression    Diabetes mellitus without complication (HCC)    Fibroid uterus 12/20/2014   7cm, 3.4cm and 1.9cm fibroids    GERD (gastroesophageal reflux disease)    Hypertension    IBS (irritable bowel syndrome) 12/89   Obesity hypoventilation syndrome (HCC) 11/19/2014   Obesity, Class III, BMI 40-49.9 (morbid obesity) (HCC)    OSA (obstructive sleep apnea)    Vitamin D deficiency      Health Maintenance  Topic Date Due   Zoster Vaccines- Shingrix (1 of 2) Never done    COLONOSCOPY  10/12/2017   URINE MICROALBUMIN  08/05/2018   COVID-19 Vaccine Booster/Moderna  04/18/2020   INFLUENZA VACCINE  10/07/2020   FOOT EXAM  10/30/2020   HEMOGLOBIN A1C  11/14/2020   OPHTHALMOLOGY EXAM  09/03/2021   MAMMOGRAM  10/03/2022   TETANUS/TDAP  05/20/2025   DEXA SCAN  Completed   Hepatitis C Screening  Completed   PNA vac Low Risk Adult  Completed   HPV VACCINES  Aged Out    Immunization History  Administered Date(s) Administered   DT  05/21/2015   Influenza, High Dose 01/26/2019, 12/08/2019   Influenza,inj 12/02/2016   Influenza,inj,quad 12/09/2015   Influenza- 12/07/2016   Moderna Sars-Covid-2 Vaccination 04/24/2019, 05/23/2019, 01/17/2020   PPD Test 03/21/2013, 05/21/2015   Pneumococcal - 13 01/04/2014   Pneumococcal - 23 03/09/2004, 11/11/2017   Td 03/09/2004   Tdap 03/10/2015   Typhoid Inactivated 05/10/2014   Zoster, Live 12/22/2012    Last Colon for (+) Cologard -  06/27/2021 - Dr Beth [name]  - Recc 6 month f/u    Screening Lung CT  - 03/06/2022 - recc 1 year f/u    Last MGM - 11/28/2021   Last dexa BMD  - 08/20/2021   T = -2.2   Osteopenia   Past Surgical History:  Procedure  Date   BLADDER SUSPENSION  03/2006, 07/2006, 01/2007   for stress incontinence   CHOLECYSTECTOMY N/A 12/08/2018   LAP  CHOLECYSTECTOMY; Beth Miyamoto, MD;    COLONOSCOPY  05/2005   Normal, recheck 10 yrs   LUMBAR FUSION Right 09/2016   L4-5 fusion by Dr. Marikay [name]   TONSILLECTOMY AND ADENOIDECTOMY     TOTAL KNEE ARTHROPLASTY Left 2012   x 2 left knee   TOTAL KNEE REVISION  2014   TOTAL KNEE REVISION Left 08/2018   Dr Beth [name], [facility]     Family History  Problem Relation Age of Onset   Hypertension Father    Diabetes Father    Parkinson's disease Father    Dementia Mother    Heart attack Paternal Grandfather 41       Died MI     Social History   Tobacco Use   Smoking status: Every Day    Packs/day: 0.75    Years: 47.00    Pack  years: 35.25    Types:  Cigarettes    Start date: 03/10/1971   Smokeless tobacco: Never   Tobacco comments:    1 pack/day from age [age], now down to 0.5  Vaping Use   Vaping Use: Never used  Substance Use Topics   Alcohol use: No   Drug use: No      ROS Constitutional: Denies fever, chills, weight loss/gain, headaches, insomnia,  night sweats, and change in appetite. Does c/o fatigue. Eyes: Denies redness, blurred vision, diplopia, discharge, itchy, watery eyes.  ENT: Denies discharge, congestion, post nasal drip, epistaxis, sore throat, earache, hearing loss, dental pain, Tinnitus, Vertigo, Sinus pain, snoring.  Cardio: Denies chest pain, palpitations, irregular heartbeat, syncope, dyspnea, diaphoresis, orthopnea, PND, claudication, edema Respiratory: denies cough, dyspnea, DOE, pleurisy, hoarseness, laryngitis, wheezing.  Gastrointestinal: Denies dysphagia, heartburn, reflux, water brash, pain, cramps, nausea, vomiting, bloating, diarrhea, constipation, hematemesis, melena, hematochezia, jaundice, hemorrhoids Genitourinary: Denies dysuria, frequency, urgency, nocturia, hesitancy, discharge, hematuria, flank pain Breast: Breast lumps, nipple discharge, bleeding.  Musculoskeletal: Denies arthralgia, myalgia, stiffness, Jt. Swelling, pain, limp, and strain/sprain. Denies falls. Skin: Denies puritis, rash, hives, warts, acne, eczema, changing in skin lesion Neuro: No weakness, tremor, incoordination, spasms, paresthesia, pain Psychiatric: Denies confusion, memory loss, sensory loss. Denies Depression. Endocrine: Denies change in weight, skin, hair change, nocturia, and paresthesia, diabetic polys, visual blurring, hyper / hypo glycemic episodes.  Heme/Lymph: No excessive bleeding, bruising, enlarged lymph nodes.  Physical Exam  BP 130/78   Pulse 98   Temp 97.9 F (36.6 C)   Resp 17   Ht 5\' 5"  (1.651 m)   Wt 164 lb 12.8 oz (74.8 kg)   LMP 04/09/2009   SpO2 98%   BMI 27.42 kg/m    General Appearance: Well nourished, well groomed and in no apparent distress.  Eyes: PERRLA, EOMs, conjunctiva no swelling or erythema, normal fundi and vessels. Sinuses: No frontal/maxillary tenderness ENT/Mouth: EACs patent / TMs  nl. Nares clear without erythema, swelling, mucoid exudates. Oral hygiene is good. No erythema, swelling, or exudate. Tongue normal, non-obstructing. Tonsils not swollen or erythematous. Hearing normal.  Neck: Supple, thyroid not palpable. No bruits, nodes or JVD. Respiratory: Respiratory effort normal.  BS equal and clear bilateral without rales, rhonci, wheezing or stridor. Cardio: Heart sounds are normal with regular rate and rhythm and no murmurs, rubs or gallops. Peripheral pulses are normal and equal bilaterally without edema. No aortic or femoral bruits. Chest: symmetric with normal excursions and percussion. Breasts: Deferred to [facility]. Abdomen: Flat, soft with bowel sounds active. Nontender, no guarding, rebound, hernias, masses, or organomegaly.  Lymphatics: Non tender without lymphadenopathy.  Musculoskeletal: Full ROM all peripheral extremities, joint stability, 5/5 strength & gait w/walker. Skin: Warm and dry without rashes, lesions, cyanosis, clubbing or  ecchymosis. (+) chalky thickened dystrophic Lt  1st toenail.  Neuro: Cranial nerves intact, reflexes equal bilaterally. Normal muscle tone, no cerebellar symptoms. Sensation intact.  Pysch: Alert and oriented X 3, normal affect, Insight and Judgment appropriate.    Assessment and Plan  1. Annual Preventative Screening Examination   2. Morbid obesity (HCC) - BMI 30+ with OSA, T2DM, htn, hyperlipidemia   3. Essential hypertension  - EKG 12-Lead - Urinalysis, Routine w reflex microscopic - Microalbumin / creatinine urine ratio - CBC with Differential/Platelet - COMPLETE METABOLIC PANEL WITH GFR - Magnesium - TSH   4. Hyperlipidemia associated with type 2 diabetes mellitus (HCC)  - Lipid  panel - TSH   5. Controlled type 2 diabetes mellitus with stage 2 chronic kidney  disease, without  long-term current use of insulin (HCC)  - Urinalysis, Routine w reflex microscopic - Microalbumin / creatinine urine ratio - HM DIABETES FOOT EXAM - LOW EXTREMITY NEUR EXAM DOCUM - Hemoglobin A1c - Insulin, random   6. Vitamin D deficiency  - Lipid panel - VITAMIN D 25 Hydroxy    7. Aortic atherosclerosis by CTscan Jan 2021  - EKG 12-Lead   8. OSA and COPD overlap syndrome (HCC)   9. Depression, major, in remission (HCC)  - TSH    10. Onychomycosis of toenail  - terbinafine (LAMISIL) 250 MG tablet;  Take 1 tablet Daily for Nail Fungus   Dispense: 90 tablet; Refill: 1    11. Screening for colorectal cancer  - Refer GI for hx/o colon polyposis     ( Patient has requested referral to a different GI provider  for further follow-ups )    12. Screening for ischemic heart disease  - EKG 12   13. FHx: heart disease  - EKG 12   14. Smoker  - EKG 12   15. Medication management  - CBC with Differential/Platelet - COMPLETE METABOLIC PANEL WITH GFR - Magnesium - Lipid panel - TSH - Hemoglobin A1c - Insulin, random - VITAMIN D 25 Hydroxy          Patient was counseled in prudent diet to achieve/maintain BMI less than 25 for weight control, BP monitoring, regular exercise and medications. Discussed med's effects and SE's. Screening labs and tests as requested with regular follow-up as recommended. Over 40 minutes of exam, counseling, chart review and high complex critical decision making was performed.   Beth Maw, MD

## 2022-12-24 NOTE — Patient Instructions (Signed)
Due to recent changes in healthcare laws, you may see the results of your imaging and laboratory studies on MyChart before your provider has had a chance to review them.  We understand that in some cases there may be results that are confusing or concerning to you. Not all laboratory results come back in the same time frame and the provider may be waiting for multiple results in order to interpret others.  Please give [place] 48 hours in order for your provider to thoroughly review all the results before contacting the office for clarification of your results.   +++++++++++++++++++++++++  Vit D  & Vit C 1,000 mg   are recommended to help protect  against the Covid-19 and other Corona viruses.    Also it's recommended  to take  Zinc 50 mg  to help  protect against the Covid-19   and best place to get  is also on [email or web address]  and don't pay more than 6-8 cents /pill !  ================================ Coronavirus (COVID-19) Are you at risk?  Are you at risk for the Coronavirus (COVID-19)?  To be considered HIGH RISK for Coronavirus (COVID-19), you have to meet the following criteria:  Traveled to [place], [place], [place], [place] or [place]; or in the [place] to [place], [place], [place]  or [place]; and have fever, cough, and shortness of breath within the last 2 weeks of travel OR Been in close contact with a person diagnosed with COVID-19 within the last 2 weeks and have  fever, cough,and shortness of breath  IF YOU DO NOT MEET THESE CRITERIA, YOU ARE CONSIDERED LOW RISK FOR COVID-19.  What to do if you are HIGH RISK for COVID-19?  If you are having a medical emergency, call 911. Seek medical care right away. Before you go to a doctor's office, urgent care or emergency department,  call ahead and tell them about your recent travel, contact with someone diagnosed with COVID-19   and your symptoms.  You should receive instructions from your physician's office regarding next  steps of care.  When you arrive at healthcare provider, tell the healthcare staff immediately you have returned from  visiting [place], [place], [place], [place] or [place]; or traveled in the [place] to [place], [place],  [place] or [place] in the last two weeks or you have been in close contact with a person diagnosed with  COVID-19 in the last 2 weeks.   Tell the health care staff about your symptoms: fever, cough and shortness of breath. After you have been seen by a medical provider, you will be either: Tested for (COVID-19) and discharged home on quarantine except to seek medical care if  symptoms worsen, and asked to  Stay home and avoid contact with others until you get your results (4-5 days)  Avoid travel on public transportation if possible (such as bus, train, or airplane) or Sent to the Emergency Department by EMS for evaluation, COVID-19 testing  and  possible admission depending on your condition and test results.  What to do if you are LOW RISK for COVID-19?  Reduce your risk of any infection by using the same precautions used for avoiding the common cold or flu:  Wash your hands often with soap and warm water for at least 20 seconds.  If soap and water are not readily available,  use an alcohol-based hand sanitizer with at least 60% alcohol.  If coughing or sneezing, cover your mouth and nose by coughing  or sneezing into the elbow areas of your shirt or coat,  into a tissue or into your sleeve (not your hands). Avoid shaking hands with others and consider head nods or verbal greetings only. Avoid touching your eyes, nose, or mouth with unwashed hands.  Avoid close contact with people who are sick. Avoid places or events with large numbers of people in one location, like concerts or sporting events. Carefully consider travel plans you have or are making. If you are planning any travel outside or inside the [place], visit the CDC's Travelers' Health webpage for the  latest health notices. If you have some symptoms but not all symptoms, continue to monitor at home and seek medical attention  if your symptoms worsen. If you are having a medical emergency, call 911.   >>>>>>>>>>>>>>>>>>>>>>>>>>>>>>>>>  We Do NOT Approve of [employer] > > > > > > > > > > > > > > > > > > > > > > > > > > > > > > > > > > > > > > >  Preventive Care for Adults  A healthy lifestyle and preventive care can promote health and wellness. Preventive health guidelines for women include the following key practices. A routine yearly physical is a good way to check with your health care provider about your health and preventive screening. It is a chance to share any concerns and updates on your health and to receive a thorough exam. Visit your dentist for a routine exam and preventive care every 6 months. Brush your teeth twice a day and floss once a day. Good oral hygiene prevents tooth decay and gum disease. The frequency of eye exams is based on your age, health, family medical history, use of contact lenses, and other factors. Follow your health care provider's recommendations for frequency of eye exams. Eat a healthy diet. Foods like vegetables, fruits, whole grains, low-fat dairy products, and lean protein foods contain the nutrients you need without too many calories. Decrease your intake of foods high in solid fats, added sugars, and salt. Eat the right amount of calories for you. Get information about a proper diet from your health care provider, if necessary. Regular physical exercise is one of the most important things you can do for your health. Most adults should get at least 150 minutes of moderate-intensity exercise (any activity that increases your heart rate and causes you to sweat) each week. In addition, most adults need muscle-strengthening exercises on 2 or more days a week. Maintain a healthy weight. The body mass index (BMI) is a screening tool to identify  possible weight problems. It provides an estimate of body fat based on height and weight. Your health care provider can find your BMI and can help you achieve or maintain a healthy weight. For adults 20 years and older: A BMI below 18.5 is considered underweight. A BMI of 18.5 to 24.9 is normal. A BMI of 25 to 29.9 is considered overweight. A BMI of 30 and above is considered obese. Maintain normal blood lipids and cholesterol levels by exercising and minimizing your intake of saturated fat. Eat a balanced diet with plenty of fruit and vegetables. If your lipid or cholesterol levels are high, you are over 50, or you are at high risk for heart disease, you may need your cholesterol levels checked more frequently. Ongoing high lipid and cholesterol levels should be treated with medicines if diet and exercise are not working. If you smoke, find out from  your health care provider how to quit. If you do not use tobacco, do not start. Lung cancer screening is recommended for adults aged 55-80 years who are at high risk for developing lung cancer because of a history of smoking. A yearly low-dose CT scan of the lungs is recommended for people who have at least a 30-pack-year history of smoking and are a current smoker or have quit within the past 15 years. A pack year of smoking is smoking an average of 1 pack of cigarettes a day for 1 year (for example: 1 pack a day for 30 years or 2 packs a day for 15 years). Yearly screening should continue until the smoker has stopped smoking for at least 15 years. Yearly screening should be stopped for people who develop a health problem that would prevent them from having lung cancer treatment. Avoid use of street drugs. Do not share needles with anyone. Ask for help if you need support or instructions about stopping the use of drugs. High blood pressure causes heart disease and increases the risk of stroke.  Ongoing high blood pressure should be treated with medicines if  weight loss and exercise do not work. If you are 42-20 years old, ask your health care provider if you should take aspirin to prevent strokes. Diabetes screening involves taking a blood sample to check your fasting blood sugar level. This should be done once every 3 years, after age 10, if you are within normal weight and without risk factors for diabetes. Testing should be considered at a younger age or be carried out more frequently if you are overweight and have at least 1 risk factor for diabetes. Breast cancer screening is essential preventive care for women. You should practice "breast self-awareness." This means understanding the normal appearance and feel of your breasts and may include breast self-examination. Any changes detected, no matter how small, should be reported to a health care provider. Women in their 69s and 30s should have a clinical breast exam (CBE) by a health care provider as part of a regular health exam every 1 to 3 years. After age 69, women should have a CBE every year. Starting at age 44, women should consider having a mammogram (breast X-ray test) every year. Women who have a family history of breast cancer should talk to their health care provider about genetic screening. Women at a high risk of breast cancer should talk to their health care providers about having an MRI and a mammogram every year. Breast cancer gene (BRCA)-related cancer risk assessment is recommended for women who have family members with BRCA-related cancers. BRCA-related cancers include breast, ovarian, tubal, and peritoneal cancers. Having family members with these cancers may be associated with an increased risk for harmful changes (mutations) in the breast cancer genes BRCA1 and BRCA2. Results of the assessment will determine the need for genetic counseling and BRCA1 and BRCA2 testing. Routine pelvic exams to screen for cancer are no longer recommended for nonpregnant women who are considered low risk for  cancer of the pelvic organs (ovaries, uterus, and vagina) and who do not have symptoms. Ask your health care provider if a screening pelvic exam is right for you. If you have had past treatment for cervical cancer or a condition that could lead to cancer, you need Pap tests and screening for cancer for at least 20 years after your treatment. If Pap tests have been discontinued, your risk factors (such as having a new sexual partner) need to be  reassessed to determine if screening should be resumed. Some women have medical problems that increase the chance of getting cervical cancer. In these cases, your health care provider may recommend more frequent screening and Pap tests.  Colorectal cancer can be detected and often prevented. Most routine colorectal cancer screening begins at the age of 96 years and continues through age 83 years. However, your health care provider may recommend screening at an earlier age if you have risk factors for colon cancer. On a yearly basis, your health care provider may provide home test kits to check for hidden blood in the stool. Use of a small camera at the end of a tube, to directly examine the colon (sigmoidoscopy or colonoscopy), can detect the earliest forms of colorectal cancer. Talk to your health care provider about this at age 39, when routine screening begins.  Direct exam of the colon should be repeated every 5-10 years through age 32 years, unless early forms of pre-cancerous polyps or small growths are found. Osteoporosis is a disease in which the bones lose minerals and strength with aging. This can result in serious bone fractures or breaks. The risk of osteoporosis can be identified using a bone density scan. Women ages 71 years and over and women at risk for fractures or osteoporosis should discuss screening with their health care providers. Ask your health care provider whether you should take a calcium supplement or vitamin D to reduce the rate of  osteoporosis. Menopause can be associated with physical symptoms and risks. Hormone replacement therapy is available to decrease symptoms and risks. You should talk to your health care provider about whether hormone replacement therapy is right for you. Use sunscreen. Apply sunscreen liberally and repeatedly throughout the day. You should seek shade when your shadow is shorter than you. Protect yourself by wearing long sleeves, pants, a wide-brimmed hat, and sunglasses year round, whenever you are outdoors. Once a month, do a whole body skin exam, using a mirror to look at the skin on your back. Tell your health care provider of new moles, moles that have irregular borders, moles that are larger than a pencil eraser, or moles that have changed in shape or color. Stay current with required vaccines (immunizations). Influenza vaccine. All adults should be immunized every year. Tetanus, diphtheria, and acellular pertussis (Td, Tdap) vaccine. Pregnant women should receive 1 dose of Tdap vaccine during each pregnancy. The dose should be obtained regardless of the length of time since the last dose. Immunization is preferred during the 27th-36th week of gestation. An adult who has not previously received Tdap or who does not know her vaccine status should receive 1 dose of Tdap. This initial dose should be followed by tetanus and diphtheria toxoids (Td) booster doses every 10 years. Adults with an unknown or incomplete history of completing a 3-dose immunization series with Td-containing vaccines should begin or complete a primary immunization series including a Tdap dose. Adults should receive a Td booster every 10 years.  Zoster vaccine. One dose is recommended for adults aged 70 years or older unless certain conditions are present.  Pneumococcal 13-valent conjugate (PCV13) vaccine. When indicated, a person who is uncertain of her immunization history and has no record of immunization should receive the PCV13  vaccine. An adult aged 64 years or older who has certain medical conditions and has not been previously immunized should receive 1 dose of PCV13 vaccine. This PCV13 should be followed with a dose of pneumococcal polysaccharide (PPSV23) vaccine. The PPSV23  vaccine dose should be obtained at least 1 or more year(s) after the dose of PCV13 vaccine. An adult aged 85 years or older who has certain medical conditions and previously received 1 or more doses of PPSV23 vaccine should receive 1 dose of PCV13. The PCV13 vaccine dose should be obtained 1 or more years after the last PPSV23 vaccine dose.  Pneumococcal polysaccharide (PPSV23) vaccine. When PCV13 is also indicated, PCV13 should be obtained first. All adults aged 60 years and older should be immunized. An adult younger than age 6 years who has certain medical conditions should be immunized. Any person who resides in a nursing home or long-term care facility should be immunized. An adult smoker should be immunized. People with an immunocompromised condition and certain other conditions should receive both PCV13 and PPSV23 vaccines. People with human immunodeficiency virus (HIV) infection should be immunized as soon as possible after diagnosis. Immunization during chemotherapy or radiation therapy should be avoided. Routine use of PPSV23 vaccine is not recommended for American Indians, [address], or people younger than 65 years unless there are medical conditions that require PPSV23 vaccine. When indicated, people who have unknown immunization and have no record of immunization should receive PPSV23 vaccine. One-time revaccination 5 years after the first dose of PPSV23 is recommended for people aged 19-64 years who have chronic kidney failure, nephrotic syndrome, asplenia, or immunocompromised conditions. People who received 1-2 doses of PPSV23 before age 30 years should receive another dose of PPSV23 vaccine at age 80 years or later if at least 5 years have  passed since the previous dose. Doses of PPSV23 are not needed for people immunized with PPSV23 at or after age 28 years.  Preventive Services / Frequency  Ages 65 years and over Blood pressure check. Lipid and cholesterol check. Lung cancer screening. / Every year if you are aged 55-80 years and have a 30-pack-year history of smoking and currently smoke or have quit within the past 15 years. Yearly screening is stopped once you have quit smoking for at least 15 years or develop a health problem that would prevent you from having lung cancer treatment. Clinical breast exam.** / Every year after age 54 years.  BRCA-related cancer risk assessment.** / For women who have family members with a BRCA-related cancer (breast, ovarian, tubal, or peritoneal cancers). Mammogram.** / Every year beginning at age 75 years and continuing for as long as you are in good health. Consult with your health care provider. Pap test.** / Every 3 years starting at age 43 years through age 70 or 43 years with 3 consecutive normal Pap tests. Testing can be stopped between 65 and 70 years with 3 consecutive normal Pap tests and no abnormal Pap or HPV tests in the past 10 years. Fecal occult blood test (FOBT) of stool. / Every year beginning at age 24 years and continuing until age 14 years. You may not need to do this test if you get a colonoscopy every 10 years. Flexible sigmoidoscopy or colonoscopy.** / Every 5 years for a flexible sigmoidoscopy or every 10 years for a colonoscopy beginning at age 51 years and continuing until age 80 years. Hepatitis C blood test.** / For all people born from [birth year] through 1963 and any individual with known risks for hepatitis C. Osteoporosis screening.** / A one-time screening for women ages 52 years and over and women at risk for fractures or osteoporosis. Skin self-exam. / Monthly. Influenza vaccine. / Every year. Tetanus, diphtheria, and acellular pertussis (Tdap/Td) vaccine.** /  1 dose  of Td every 10 years. Zoster vaccine.** / 1 dose for adults aged 36 years or older. Pneumococcal 13-valent conjugate (PCV13) vaccine.** / Consult your health care provider. Pneumococcal polysaccharide (PPSV23) vaccine.** / 1 dose for all adults aged 1 years and older. Screening for abdominal aortic aneurysm (AAA)  by ultrasound is recommended for people who have history of high blood pressure or who are current or former smokers. ++++++++++++++++++++ Recommend Adult Low Dose Aspirin or  coated  Aspirin 81 mg daily  To reduce risk of Colon Cancer 40 %,  Skin Cancer 26 % ,  Melanoma 46%  and  Pancreatic cancer 60% ++++++++++++++++++++ Vitamin D goal  is between 70-100.  Please make sure that you are taking your Vitamin D as directed.  It is very important as a natural anti-inflammatory  helping hair, skin, and nails, as well as reducing stroke and heart attack risk.  It helps your bones and helps with mood. It also decreases numerous cancer risks so please take it as directed.  Low Vit D is associated with a 200-300% higher risk for CANCER  and 200-300% higher risk for HEART   ATTACK  &  STROKE.   .....................................Marland Kitchen It is also associated with higher death rate at younger ages,  autoimmune diseases like Rheumatoid arthritis, Lupus, Multiple Sclerosis.    Also many other serious conditions, like depression, Alzheimer's Dementia, infertility, muscle aches, fatigue, fibromyalgia - just to name a few. ++++++++++++++++++ Recommend the book "The END of DIETING" by Dr Monico Hoar  & the book "The END of DIABETES " by Dr Monico Hoar At [email or web address] - get book & Audio CD's    Being diabetic has a  300% increased risk for heart attack, stroke, cancer, and alzheimer- type vascular dementia. It is very important that you work harder with diet by avoiding all foods that are white. Avoid white rice (brown & wild rice is OK), white potatoes (sweetpotatoes in moderation is OK),  White bread or wheat bread or anything made out of white flour like bagels, donuts, rolls, buns, biscuits, cakes, pastries, cookies, pizza crust, and pasta (made from white flour & egg whites) - vegetarian pasta or spinach or wheat pasta is OK. Multigrain breads like Arnold's or [employer], or multigrain sandwich thins or flatbreads.  Diet, exercise and weight loss can reverse and cure diabetes in the early stages.  Diet, exercise and weight loss is very important in the control and prevention of complications of diabetes which affects every system in your body, ie. Brain - dementia/stroke, eyes - glaucoma/blindness, heart - heart attack/heart failure, kidneys - dialysis, stomach - gastric paralysis, intestines - malabsorption, nerves - severe painful neuritis, circulation - gangrene & loss of a leg(s), and finally cancer and Alzheimers.    I recommend avoid fried & greasy foods,  sweets/candy, white rice (brown or wild rice or Quinoa is OK), white potatoes (sweet potatoes are OK) - anything made from white flour - bagels, doughnuts, rolls, buns, biscuits,white and wheat breads, pizza crust and traditional pasta made of white flour & egg white(vegetarian pasta or spinach or wheat pasta is OK).  Multi-grain bread is OK - like multi-grain flat bread or sandwich thins. Avoid alcohol in excess. Exercise is also important.    Eat all the vegetables you want - avoid meat, especially red meat and dairy - especially cheese.  Cheese is the most concentrated form of trans-fats which is the worst thing to clog up our arteries. Veggie cheese is OK  which can be found in the fresh produce section at [facility] or [employer] or [employer]  +++++++++++++++++++ DASH Eating Plan  DASH stands for "Dietary Approaches to Stop Hypertension."   The DASH eating plan is a healthy eating plan that has been shown to reduce high blood pressure (hypertension). Additional health benefits may include reducing the risk of type 2  diabetes mellitus, heart disease, and stroke. The DASH eating plan may also help with weight loss. WHAT DO I NEED TO KNOW ABOUT THE DASH EATING PLAN? For the DASH eating plan, you will follow these general guidelines: Choose foods with a percent daily value for sodium of less than 5% (as listed on the food label). Use salt-free seasonings or herbs instead of table salt or sea salt. Check with your health care provider or pharmacist before using salt substitutes. Eat lower-sodium products, often labeled as "lower sodium" or "no salt added." Eat fresh foods. Eat more vegetables, fruits, and low-fat dairy products. Choose whole grains. Look for the word "whole" as the first word in the ingredient list. Choose fish  Limit sweets, desserts, sugars, and sugary drinks. Choose heart-healthy fats. Eat veggie cheese  Eat more home-cooked food and less restaurant, buffet, and fast food. Limit fried foods. Cook foods using methods other than frying. Limit canned vegetables. If you do use them, rinse them well to decrease the sodium. When eating at a restaurant, ask that your food be prepared with less salt, or no salt if possible.                      WHAT FOODS CAN I EAT? Read Dr Francis Dowse Fuhrman's books on The End of Dieting & The End of Diabetes  Grains Whole grain or whole wheat bread. Brown rice. Whole grain or whole wheat pasta. Quinoa, bulgur, and whole grain cereals. Low-sodium cereals. Corn or whole wheat flour tortillas. Whole grain cornbread. Whole grain crackers. Low-sodium crackers.  Vegetables Fresh or frozen vegetables (raw, steamed, roasted, or grilled). Low-sodium or reduced-sodium tomato and vegetable juices. Low-sodium or reduced-sodium tomato sauce and paste. Low-sodium or reduced-sodium canned vegetables.   Fruits All fresh, canned (in natural juice), or frozen fruits.  Protein Products  All fish and seafood.  Dried beans, peas, or lentils. Unsalted nuts and seeds. Unsalted  canned beans.  Dairy Low-fat dairy products, such as skim or 1% milk, 2% or reduced-fat cheeses, low-fat ricotta or cottage cheese, or plain low-fat yogurt. Low-sodium or reduced-sodium cheeses.  Fats and Oils Tub margarines without trans fats. Light or reduced-fat mayonnaise and salad dressings (reduced sodium). Avocado. Safflower, olive, or canola oils. Natural peanut or almond butter.  Other Unsalted popcorn and pretzels. The items listed above may not be a complete list of recommended foods or beverages. Contact your dietitian for more options.  +++++++++++++++  WHAT FOODS ARE NOT RECOMMENDED? Grains/ White flour or wheat flour White bread. White pasta. White rice. Refined cornbread. Bagels and croissants. Crackers that contain trans fat.  Vegetables  Creamed or fried vegetables. Vegetables in a . Regular canned vegetables. Regular canned tomato sauce and paste. Regular tomato and vegetable juices.  Fruits Dried fruits. Canned fruit in light or heavy syrup. Fruit juice.  Meat and Other Protein Products Meat in general - RED meat & White meat.  Fatty cuts of meat. Ribs, chicken wings, all processed meats as bacon, sausage, bologna, salami, fatback, hot dogs, bratwurst and packaged luncheon meats.  Dairy Whole or 2% milk, cream, half-and-half, and cream cheese.  Whole-fat or sweetened yogurt. Full-fat cheeses or blue cheese. Non-dairy creamers and whipped toppings. Processed cheese, cheese spreads, or cheese curds.  Condiments Onion and garlic salt, seasoned salt, table salt, and sea salt. Canned and packaged gravies. Worcestershire sauce. Tartar sauce. Barbecue sauce. Teriyaki sauce. Soy sauce, including reduced sodium. Steak sauce. Fish sauce. Oyster sauce. Cocktail sauce. Horseradish. Ketchup and mustard. Meat flavorings and tenderizers. Bouillon cubes. Hot sauce. Tabasco sauce. Marinades. Taco seasonings. Relishes.  Fats and Oils Butter, stick margarine, lard, shortening and  bacon fat. Coconut, palm kernel, or palm oils. Regular salad dressings.  Pickles and olives. Salted popcorn and pretzels.  The items listed above may not be a complete list of foods and beverages to avoid.

## 2022-12-25 LAB — CBC WITH DIFFERENTIAL/PLATELET
Absolute Lymphocytes: 1607 {cells}/uL (ref 850–3900)
Absolute Monocytes: 343 {cells}/uL (ref 200–950)
Basophils Absolute: 29 {cells}/uL (ref 0–200)
Basophils Relative: 0.6 %
Eosinophils Absolute: 201 {cells}/uL (ref 15–500)
Eosinophils Relative: 4.1 %
HCT: 43.6 % (ref 35.0–45.0)
Hemoglobin: 13.8 g/dL (ref 11.7–15.5)
MCH: 29.8 pg (ref 27.0–33.0)
MCHC: 31.7 g/dL — ABNORMAL LOW (ref 32.0–36.0)
MCV: 94.2 fL (ref 80.0–100.0)
MPV: 11.7 fL (ref 7.5–12.5)
Monocytes Relative: 7 %
Neutro Abs: 2720 {cells}/uL (ref 1500–7800)
Neutrophils Relative %: 55.5 %
Platelets: 156 10*3/uL (ref 140–400)
RBC: 4.63 10*6/uL (ref 3.80–5.10)
RDW: 13.4 % (ref 11.0–15.0)
Total Lymphocyte: 32.8 %
WBC: 4.9 10*3/uL (ref 3.8–10.8)

## 2022-12-25 LAB — HEMOGLOBIN A1C
Hgb A1c MFr Bld: 5.5 %{Hb} (ref <5.7)
Mean Plasma Glucose: 111 mg/dL
eAG (mmol/L): 6.2 mmol/L

## 2022-12-25 LAB — URINALYSIS, ROUTINE W REFLEX MICROSCOPIC
Bacteria, UA: NONE SEEN /[HPF]
Bilirubin Urine: NEGATIVE
Glucose, UA: NEGATIVE
Hgb urine dipstick: NEGATIVE
Hyaline Cast: NONE SEEN /[LPF]
Ketones, ur: NEGATIVE
Nitrite: NEGATIVE
Protein, ur: NEGATIVE
RBC / HPF: NONE SEEN /[HPF] (ref 0–2)
Specific Gravity, Urine: 1.033 (ref 1.001–1.035)
pH: <=5 (ref 5.0–8.0)

## 2022-12-25 LAB — COMPLETE METABOLIC PANEL WITH GFR
AG Ratio: 1.4 (calc) (ref 1.0–2.5)
ALT: 24 U/L (ref 6–29)
AST: 32 U/L (ref 10–35)
Albumin: 3.9 g/dL (ref 3.6–5.1)
Alkaline phosphatase (APISO): 131 U/L (ref 37–153)
BUN: 23 mg/dL (ref 7–25)
CO2: 30 mmol/L (ref 20–32)
Calcium: 9 mg/dL (ref 8.6–10.4)
Chloride: 105 mmol/L (ref 98–110)
Creat: 0.72 mg/dL (ref 0.60–1.00)
Globulin: 2.8 g/dL (ref 1.9–3.7)
Glucose, Bld: 102 mg/dL — ABNORMAL HIGH (ref 65–99)
Potassium: 3.7 mmol/L (ref 3.5–5.3)
Sodium: 142 mmol/L (ref 135–146)
Total Bilirubin: 0.4 mg/dL (ref 0.2–1.2)
Total Protein: 6.7 g/dL (ref 6.1–8.1)
eGFR: 90 mL/min/{1.73_m2} (ref >=60)

## 2022-12-25 LAB — LIPID PANEL
Cholesterol: 135 mg/dL (ref <200)
HDL: 55 mg/dL (ref >=50)
LDL Cholesterol (Calc): 67 mg/dL
Non-HDL Cholesterol (Calc): 80 mg/dL (ref <130)
Total CHOL/HDL Ratio: 2.5 (calc) (ref <5.0)
Triglycerides: 47 mg/dL (ref <150)

## 2022-12-25 LAB — VITAMIN D 25 HYDROXY (VIT D DEFICIENCY, FRACTURES): Vit D, 25-Hydroxy: 89 ng/mL (ref 30–100)

## 2022-12-25 LAB — INSULIN, RANDOM: Insulin: 18.3 u[IU]/mL

## 2022-12-25 LAB — MAGNESIUM: Magnesium: 2 mg/dL (ref 1.5–2.5)

## 2022-12-25 LAB — MICROALBUMIN / CREATININE URINE RATIO
Creatinine, Urine: 127 mg/dL (ref 20–275)
Microalb, Ur: <0.2 mg/dL

## 2022-12-25 LAB — MICROSCOPIC MESSAGE

## 2022-12-25 LAB — TSH: TSH: 3.7 m[IU]/L (ref 0.40–4.50)

## 2022-12-26 NOTE — Progress Notes (Signed)
<>*<>*<>*<>*<>*<>*<>*<>*<>*<>*<>*<>*<>*<>*<>*<>*<>*<>*<>*<>*<>*<>*<>*<>*<> <>*<>*<>*<>*<>*<>*<>*<>*<>*<>*<>*<>*<>*<>*<>*<>*<>*<>*<>*<>*<>*<>*<>*<>*<>  -  Test results slightly outside the reference range are not unusual. If there is anything important, I will review this with you,  otherwise it is considered normal test values.  If you have further questions,  please do not hesitate to contact me at the office or via My Chart.   <>*<>*<>*<>*<>*<>*<>*<>*<>*<>*<>*<>*<>*<>*<>*<>*<>*<>*<>*<>*<>*<>*<>*<>*<> <>*<>*<>*<>*<>*<>*<>*<>*<>*<>*<>*<>*<>*<>*<>*<>*<>*<>*<>*<>*<>*<>*<>*<>*<>  -  U/A has WBCs , but since Nitrates Negative = No Infection  <>*<>*<>*<>*<>*<>*<>*<>*<>*<>*<>*<>*<>*<>*<>*<>*<>*<>*<>*<>*<>*<>*<>*<>*<> <>*<>*<>*<>*<>*<>*<>*<>*<>*<>*<>*<>*<>*<>*<>*<>*<>*<>*<>*<>*<>*<>*<>*<>*<>  -  Chol = 135  & LDL = 67  - Both   Excellent   - Very low risk for Heart Attack  / Stroke  <>*<>*<>*<>*<>*<>*<>*<>*<>*<>*<>*<>*<>*<>*<>*<>*<>*<>*<>*<>*<>*<>*<>*<>*<> <>*<>*<>*<>*<>*<>*<>*<>*<>*<>*<>*<>*<>*<>*<>*<>*<>*<>*<>*<>*<>*<>*<>*<>*<>  -  A1c - Normal - No Diabetes - Great   !   <>*<>*<>*<>*<>*<>*<>*<>*<>*<>*<>*<>*<>*<>*<>*<>*<>*<>*<>*<>*<>*<>*<>*<>*<> <>*<>*<>*<>*<>*<>*<>*<>*<>*<>*<>*<>*<>*<>*<>*<>*<>*<>*<>*<>*<>*<>*<>*<>*<>  -  Vitamin D = 89 - Excellent  - Please keep dosage same   <>*<>*<>*<>*<>*<>*<>*<>*<>*<>*<>*<>*<>*<>*<>*<>*<>*<>*<>*<>*<>*<>*<>*<>*<> <>*<>*<>*<>*<>*<>*<>*<>*<>*<>*<>*<>*<>*<>*<>*<>*<>*<>*<>*<>*<>*<>*<>*<>*<>  -  All Else - CBC - Kidneys - Electrolytes - Liver - Magnesium & Thyroid    - all  Normal / OK  <>*<>*<>*<>*<>*<>*<>*<>*<>*<>*<>*<>*<>*<>*<>*<>*<>*<>*<>*<>*<>*<>*<>*<>*<> <>*<>*<>*<>*<>*<>*<>*<>*<>*<>*<>*<>*<>*<>*<>*<>*<>*<>*<>*<>*<>*<>*<>*<>*<>  -  Keep up the [place] Work  !    <>*<>*<>*<>*<>*<>*<>*<>*<>*<>*<>*<>*<>*<>*<>*<>*<>*<>*<>*<>*<>*<>*<>*<>*<> <>*<>*<>*<>*<>*<>*<>*<>*<>*<>*<>*<>*<>*<>*<>*<>*<>*<>*<>*<>*<>*<>*<>*<>*<>

## 2022-12-27 ENCOUNTER — Encounter: Payer: Self-pay | Admitting: Internal Medicine

## 2022-12-29 ENCOUNTER — Telehealth: Payer: Self-pay

## 2022-12-29 NOTE — Telephone Encounter (Signed)
Mychart message sent.

## 2022-12-31 ENCOUNTER — Ambulatory Visit (INDEPENDENT_AMBULATORY_CARE_PROVIDER_SITE_OTHER): Payer: Self-pay

## 2022-12-31 DIAGNOSIS — Z0289 Encounter for other administrative examinations: Secondary | ICD-10-CM

## 2022-12-31 NOTE — Progress Notes (Signed)
RN visit to give MoCa test for [employer]

## 2023-01-28 ENCOUNTER — Other Ambulatory Visit: Payer: Self-pay | Admitting: Neurology

## 2023-01-28 ENCOUNTER — Telehealth: Payer: Self-pay | Admitting: Neurology

## 2023-01-28 DIAGNOSIS — G309 Alzheimer's disease, unspecified: Secondary | ICD-10-CM

## 2023-01-28 NOTE — Telephone Encounter (Signed)
Two MRI brain orders sent to GI [phone] No auth required

## 2023-02-18 ENCOUNTER — Encounter: Payer: Self-pay | Admitting: Internal Medicine

## 2023-02-22 ENCOUNTER — Other Ambulatory Visit: Payer: Self-pay

## 2023-02-22 DIAGNOSIS — G3184 Mild cognitive impairment, so stated: Secondary | ICD-10-CM

## 2023-02-22 DIAGNOSIS — G309 Alzheimer's disease, unspecified: Secondary | ICD-10-CM

## 2023-02-22 DIAGNOSIS — R413 Other amnesia: Secondary | ICD-10-CM

## 2023-02-22 NOTE — Progress Notes (Signed)
New MRI ordered for correct time frame

## 2023-02-24 ENCOUNTER — Telehealth: Payer: Self-pay

## 2023-02-24 NOTE — Telephone Encounter (Signed)
Call to patient and informed MRI would need to be moved between time frame 03/09/23-03/18/23. She states she did miss a call from [facility] imaging and will call them back today.

## 2023-03-01 ENCOUNTER — Other Ambulatory Visit: Payer: Self-pay | Admitting: Nurse Practitioner

## 2023-03-01 DIAGNOSIS — B351 Tinea unguium: Secondary | ICD-10-CM

## 2023-03-04 ENCOUNTER — Other Ambulatory Visit: Payer: Self-pay | Admitting: Nurse Practitioner

## 2023-03-05 ENCOUNTER — Other Ambulatory Visit: Payer: Medicare Other

## 2023-03-14 ENCOUNTER — Ambulatory Visit
Admission: RE | Admit: 2023-03-14 | Discharge: 2023-03-14 | Disposition: A | Discharge status: Home / Self Care | Payer: Medicare Other | Source: Ambulatory Visit | Attending: Neurology | Admitting: Neurology

## 2023-03-14 DIAGNOSIS — G3184 Mild cognitive impairment, so stated: Secondary | ICD-10-CM | POA: Diagnosis not present

## 2023-03-16 ENCOUNTER — Other Ambulatory Visit (HOSPITAL_BASED_OUTPATIENT_CLINIC_OR_DEPARTMENT_OTHER): Payer: Self-pay | Admitting: Obstetrics & Gynecology

## 2023-03-16 ENCOUNTER — Ambulatory Visit (HOSPITAL_BASED_OUTPATIENT_CLINIC_OR_DEPARTMENT_OTHER): Payer: Medicare Other | Admitting: Obstetrics & Gynecology

## 2023-03-16 ENCOUNTER — Encounter (HOSPITAL_BASED_OUTPATIENT_CLINIC_OR_DEPARTMENT_OTHER): Payer: Self-pay | Admitting: Obstetrics & Gynecology

## 2023-03-16 ENCOUNTER — Telehealth (HOSPITAL_BASED_OUTPATIENT_CLINIC_OR_DEPARTMENT_OTHER): Payer: Self-pay | Admitting: *Deleted

## 2023-03-16 VITALS — BP 119/83 | HR 82 | Ht 65.0 in | Wt 164.8 lb

## 2023-03-16 DIAGNOSIS — T83718A Erosion of other implanted mesh and other prosthetic materials to surrounding organ or tissue, initial encounter: Secondary | ICD-10-CM | POA: Diagnosis not present

## 2023-03-16 DIAGNOSIS — N3281 Overactive bladder: Secondary | ICD-10-CM | POA: Diagnosis not present

## 2023-03-16 DIAGNOSIS — F17200 Nicotine dependence, unspecified, uncomplicated: Secondary | ICD-10-CM

## 2023-03-16 DIAGNOSIS — L900 Lichen sclerosus et atrophicus: Secondary | ICD-10-CM

## 2023-03-16 DIAGNOSIS — M85851 Other specified disorders of bone density and structure, right thigh: Secondary | ICD-10-CM

## 2023-03-16 DIAGNOSIS — Z01411 Encounter for gynecological examination (general) (routine) with abnormal findings: Secondary | ICD-10-CM

## 2023-03-16 DIAGNOSIS — M85852 Other specified disorders of bone density and structure, left thigh: Secondary | ICD-10-CM

## 2023-03-16 DIAGNOSIS — Z860101 Personal history of adenomatous and serrated colon polyps: Secondary | ICD-10-CM

## 2023-03-16 MED ORDER — MOMETASONE FUROATE 0.1 % EX OINT: TOPICAL_OINTMENT | Freq: Every day | CUTANEOUS | 2 refills | Status: DC

## 2023-03-16 MED ORDER — ESTRADIOL 0.1 MG/GM VA CREA
TOPICAL_CREAM | VAGINAL | 1 refills | Status: DC
Start: 2023-03-16 — End: 2023-07-01

## 2023-03-16 NOTE — Telephone Encounter (Signed)
 LMOVM for pt to call office regarding appt today.

## 2023-03-16 NOTE — Telephone Encounter (Signed)
-----   Message from Ronal GORMAN Pinal sent at 03/16/2023  1:12 PM EST ----- Regarding: f/u Luke, I saw this pt today.  She has new erosion of mid urethral sling mesh.  MacDiarmid put it in.  I talked with him.  He recommended vaginal estrogen cream.  When I put the order in for the pt after she left, it says she had hives to prem-pro.  Can you call her and see if she remembers this and if she's ever used any other estrogen products?  Also, I told her she could follow up in two years but Sherell was already gone for lunch (pt came late).  She has lichen sclerosus so I want to see her yearly.  Can you make appt for her?  Thanks.  Once I know about any other estrogen she's used, then I can send in new rx for her.  Thanks.  Elvie

## 2023-03-16 NOTE — Progress Notes (Deleted)
 73 y.o. [id number] Divorced White or Caucasian female here for breast and pelvic exam.  I am also following her for ***.  Denies vaginal bleeding.  Patient's last menstrual period was 04/09/2009.          Sexually active: {yes no:314532}  H/O STD:  {YES NO:22349}  Health Maintenance: PCP:  ****.  Last wellness appt was ***.  Did blood work at that appt:   Vaccines are up to date:  {YES NO:22349} Colonoscopy:  *** MMG:  *** BMD:  *** Last pap smear:  ***.   H/o abnormal pap smear:      reports that she has been smoking cigarettes. She started smoking about 52 years ago. She has a 39 pack-year smoking history. She has never used smokeless tobacco. She reports that she does not drink alcohol and does not use drugs.  Past Medical History:  Diagnosis Date   Abscessed tooth    Anemia    Anxiety    Arthritis    Bell's palsy 01/1985   COPD (chronic obstructive pulmonary disease) (HCC)    Depression    Diabetes mellitus without complication (HCC)    Fibroid uterus 12/20/2014   7cm, 3.4cm and 1.9cm fibroids    Gallstones    GERD (gastroesophageal reflux disease)    Hyperlipidemia    Hypertension    IBS (irritable bowel syndrome) 02/1988   Obesity hypoventilation syndrome (HCC) 11/19/2014   Obesity, Class III, BMI 40-49.9 (morbid obesity) (HCC)    OSA (obstructive sleep apnea)    no cpap   Osteopenia    Vitamin D  deficiency     Past Surgical History:  Procedure Laterality Date   BLADDER SUSPENSION  03/2006, 07/2006, 01/2007   for stress incontinence   CHOLECYSTECTOMY N/A 12/08/2018   Procedure: LAPAROSCOPIC CHOLECYSTECTOMY;  Surgeon: Vernetta Berg, MD;  Location: [facility] OR;  Service: General;  Laterality: N/A;   COLONOSCOPY  05/2005   Normal, recheck 10 yrs   COLONOSCOPY WITH PROPOFOL  N/A 06/27/2021   Procedure: COLONOSCOPY WITH PROPOFOL ;  Surgeon: Rollin Dover, MD;  Location: [facility];  Service: Endoscopy;  Laterality: N/A;   HEMOSTASIS CLIP PLACEMENT  06/27/2021    Procedure: HEMOSTASIS CLIP PLACEMENT;  Surgeon: Rollin Dover, MD;  Location: [facility];  Service: Endoscopy;;   LUMBAR FUSION Right 09/2016   L4-5 fusion by Dr. Alm Molt   POLYPECTOMY  06/27/2021   Procedure: POLYPECTOMY;  Surgeon: Rollin Dover, MD;  Location: [facility];  Service: Endoscopy;;   TONSILLECTOMY AND ADENOIDECTOMY     TOTAL KNEE ARTHROPLASTY Left 2012   x 2 left knee   TOTAL KNEE REVISION  2014   TOTAL KNEE REVISION Left 08/2018   Dr Omar, [facility]    Current Outpatient Medications  Medication Sig Dispense Refill   ALPRAZolam  (XANAX ) 0.25 MG tablet Take 1/2 to 1 tablet 2 x / day if needed for anxiety. (Patient taking differently: Take 0.25 mg by mouth 2 (two) times daily.) 60 tablet 0   B Complex-Biotin-FA (B-100 COMPLEX PO) Take by mouth.     BIOTIN PO Take 1 tablet by mouth daily.     Cholecalciferol (VITAMIN D ) 50 MCG (2000 UT) CAPS Take 2,000 Units by mouth daily.     Cyanocobalamin (B-12) 100 MCG TABS Take by mouth.     LINZESS  72 MCG capsule TAKE 1 CAPSULE(72 MCG) BY MOUTH DAILY BEFORE BREAKFAST 30 capsule 2   Magnesium 500 MG TABS Take 500 mg by mouth daily.     milk thistle 175 MG  tablet Take 175 mg by mouth daily.     mometasone  (ELOCON ) 0.1 % ointment Apply topically two to three times weekly. 45 g 2   Omega-3 Fatty Acids (FISH OIL OMEGA-3 PO) Take 1 capsule by mouth daily.     rosuvastatin  (CRESTOR ) 10 MG tablet TAKE 1 TABLET BY MOUTH DAILY FOR CHOLESTEROL 90 tablet 3   terbinafine  (LAMISIL ) 250 MG tablet TAKE 1 TABLET BY MOUTH DAILY FOR 1 MONTH ON, THEN 1 MONTH OFF FOR NAIL FUNGUS. SKIP EVERY OTHER MONTH 90 tablet 0   TURMERIC PO Take by mouth.     venlafaxine  XR (EFFEXOR -XR) 150 MG 24 hr capsule Take 150 mg by mouth 2 (two) times daily.      vitamin C (ASCORBIC ACID) 500 MG tablet Take 500 mg by mouth daily.     vitamin E 180 MG (400 UNITS) capsule Take 400 Units by mouth daily.     zinc gluconate 50 MG tablet Take 50 mg by mouth daily.      No current facility-administered medications for this visit.    Family History  Problem Relation Age of Onset   Dementia Mother    Hypertension Father    Diabetes Father    Parkinson's disease Father    Heart attack Paternal Grandfather 27       Died MI   Colon cancer Neg Hx    Esophageal cancer Neg Hx    Pancreatic cancer Neg Hx    Stomach cancer Neg Hx     Review of Systems  Exam:   BP 119/83 (BP Location: Left Arm, Patient Position: Sitting, Cuff Size: Large)   Pulse 82   Ht 5' 5 (1.651 m) Comment: Reported  Wt 164 lb 12.8 oz (74.8 kg)   LMP 04/09/2009   BMI 27.42 kg/m   Height: 5' 5 (165.1 cm) (Reported)  General appearance: alert, cooperative and appears stated age Breasts: {Exam; breast:13139::normal appearance, no masses or tenderness} Abdomen: soft, non-tender; bowel sounds normal; no masses,  no organomegaly Lymph nodes: Cervical, supraclavicular, and axillary nodes normal.  No abnormal inguinal nodes palpated Neurologic: Grossly normal  Pelvic: External genitalia:  no lesions              Urethra:  normal appearing urethra with no masses, tenderness or lesions              Bartholins and Skenes: normal                 Vagina: normal appearing vagina with atrophic changes ***and no discharge, no lesions              Cervix: {exam; cervix:14595}              Pap taken: {yes no:314532} Bimanual Exam:  Uterus:  {exam; uterus:12215}              Adnexa: {exam; adnexa:12223}               Rectovaginal: Confirms               Anus:  normal sphincter tone, no lesions  Chaperone, ***, CMA, was present for exam.  Assessment/Plan: There are no diagnoses linked to this encounter.

## 2023-03-16 NOTE — Progress Notes (Signed)
 73 y.o. [id number] Divorced White or Caucasian female here for breast and pelvic exam.  I am also following her for PMP status.  Denies vaginal bleeding.    Is having some vulvar itching.  Not really using topical steroid at this time.  Feels this is related to urinary incontinence issues and GI issues with IBS.    Has been having Botox injections for urinary incontinence.  Is still having a lot of leaking.  Had 100 pound weight loss a few years ago and feels this make symptoms worse.   Saw neurology this past year due to family's concerns about memory.  She had MRI and amyloid testing.  She is in a medication trial.  She is unsure how long the trial will last.  Has been diagnosed with alzheimer's.    Patient's last menstrual period was 04/09/2009.          Sexually active: No.  H/O STD:  no  Health Maintenance: PCP:  Dr. Tonita.  Last wellness appt was 12/2022.  Did blood work at that appt:  yes Vaccines are up to date:  yes Colonoscopy:  06/2021.  Large adenomatous polyp was removed.  6 month follow up was recommended.  She declines doing a follow up.   MMG:  12/2022 BMD:  08/2021, osteopenia Last pap smear:  12/2021.   H/o abnormal pap smear:  no    reports that she has been smoking cigarettes. She started smoking about 52 years ago. She has a 39 pack-year smoking history. She has never used smokeless tobacco. She reports that she does not drink alcohol and does not use drugs.  Past Medical History:  Diagnosis Date   Abscessed tooth    Anemia    Anxiety    Arthritis    Bell's palsy 01/1985   COPD (chronic obstructive pulmonary disease) (HCC)    Depression    Diabetes mellitus without complication (HCC)    Fibroid uterus 12/20/2014   7cm, 3.4cm and 1.9cm fibroids    Gallstones    GERD (gastroesophageal reflux disease)    Hyperlipidemia    Hypertension    IBS (irritable bowel syndrome) 02/1988   Obesity hypoventilation syndrome (HCC) 11/19/2014   Obesity, Class III, BMI 40-49.9  (morbid obesity) (HCC)    OSA (obstructive sleep apnea)    no cpap   Osteopenia    Vitamin D  deficiency     Past Surgical History:  Procedure Laterality Date   BLADDER SUSPENSION  03/2006, 07/2006, 01/2007   for stress incontinence   CHOLECYSTECTOMY N/A 12/08/2018   Procedure: LAPAROSCOPIC CHOLECYSTECTOMY;  Surgeon: Vernetta Berg, MD;  Location: [facility] OR;  Service: General;  Laterality: N/A;   COLONOSCOPY  05/2005   Normal, recheck 10 yrs   COLONOSCOPY WITH PROPOFOL  N/A 06/27/2021   Procedure: COLONOSCOPY WITH PROPOFOL ;  Surgeon: Rollin Dover, MD;  Location: [facility];  Service: Endoscopy;  Laterality: N/A;   HEMOSTASIS CLIP PLACEMENT  06/27/2021   Procedure: HEMOSTASIS CLIP PLACEMENT;  Surgeon: Rollin Dover, MD;  Location: [facility];  Service: Endoscopy;;   LUMBAR FUSION Right 09/2016   L4-5 fusion by Dr. Alm Molt   POLYPECTOMY  06/27/2021   Procedure: POLYPECTOMY;  Surgeon: Rollin Dover, MD;  Location: [facility];  Service: Endoscopy;;   TONSILLECTOMY AND ADENOIDECTOMY     TOTAL KNEE ARTHROPLASTY Left 2012   x 2 left knee   TOTAL KNEE REVISION  2014   TOTAL KNEE REVISION Left 08/2018   Dr Omar Kemp Leash    Current Outpatient  Medications  Medication Sig Dispense Refill   ALPRAZolam  (XANAX ) 0.25 MG tablet Take 1/2 to 1 tablet 2 x / day if needed for anxiety. (Patient taking differently: Take 0.25 mg by mouth 2 (two) times daily.) 60 tablet 0   B Complex-Biotin-FA (B-100 COMPLEX PO) Take by mouth.     BIOTIN PO Take 1 tablet by mouth daily.     Cholecalciferol (VITAMIN D ) 50 MCG (2000 UT) CAPS Take 2,000 Units by mouth daily.     Cyanocobalamin (B-12) 100 MCG TABS Take by mouth.     LINZESS  72 MCG capsule TAKE 1 CAPSULE(72 MCG) BY MOUTH DAILY BEFORE BREAKFAST 30 capsule 2   Magnesium 500 MG TABS Take 500 mg by mouth daily.     milk thistle 175 MG tablet Take 175 mg by mouth daily.     mometasone  (ELOCON ) 0.1 % ointment Apply topically two to three times  weekly. 45 g 2   Omega-3 Fatty Acids (FISH OIL OMEGA-3 PO) Take 1 capsule by mouth daily.     rosuvastatin  (CRESTOR ) 10 MG tablet TAKE 1 TABLET BY MOUTH DAILY FOR CHOLESTEROL 90 tablet 3   terbinafine  (LAMISIL ) 250 MG tablet TAKE 1 TABLET BY MOUTH DAILY FOR 1 MONTH ON, THEN 1 MONTH OFF FOR NAIL FUNGUS. SKIP EVERY OTHER MONTH 90 tablet 0   TURMERIC PO Take by mouth.     venlafaxine  XR (EFFEXOR -XR) 150 MG 24 hr capsule Take 150 mg by mouth 2 (two) times daily.      vitamin C (ASCORBIC ACID) 500 MG tablet Take 500 mg by mouth daily.     vitamin E 180 MG (400 UNITS) capsule Take 400 Units by mouth daily.     zinc gluconate 50 MG tablet Take 50 mg by mouth daily.     No current facility-administered medications for this visit.    Family History  Problem Relation Age of Onset   Dementia Mother    Hypertension Father    Diabetes Father    Parkinson's disease Father    Heart attack Paternal Grandfather 80       Died MI   Colon cancer Neg Hx    Esophageal cancer Neg Hx    Pancreatic cancer Neg Hx    Stomach cancer Neg Hx     Review of Systems  Constitutional: Negative.   Genitourinary: Negative.     Exam:   BP 119/83 (BP Location: Left Arm, Patient Position: Sitting, Cuff Size: Large)   Pulse 82   Ht 5' 5 (1.651 m) Comment: Reported  Wt 164 lb 12.8 oz (74.8 kg)   LMP 04/09/2009   BMI 27.42 kg/m   Height: 5' 5 (165.1 cm) (Reported)  General appearance: alert, cooperative and appears stated age Breasts: normal appearance, no masses or tenderness Abdomen: soft, non-tender; bowel sounds normal; no masses,  no organomegaly Lymph nodes: Cervical, supraclavicular, and axillary nodes normal.  No abnormal inguinal nodes palpated Neurologic: Grossly normal  Pelvic: External genitalia:  hypopigmentation of inner labia majora in kissing pattern with similar skin changes down around rectum              Urethra:  normal appearing urethra with no masses, tenderness or lesions               Bartholins and Skenes: normal                 Vagina: normal appearing vagina with atrophic changes and no discharge, no lesions.  On BME, area of sling  erosion is felt just in the midline.  With re visualization, small area of erosion noted.              Cervix: no lesions              Pap taken: No. Bimanual Exam:  Uterus:  normal size, contour, position, consistency, mobility, non-tender              Adnexa: normal adnexa and no mass, fullness, tenderness               Rectovaginal: Confirms               Anus:  normal sphincter tone, no lesions  Chaperone, Bascom Kotyk, CMA, was present for exam.  Assessment/Plan: 1. Encntr for gyn exam (general) (routine) w abnormal findings (Primary) - Pap smear neg 12/2021 - Mammogram 12/2022 - Colonoscopy 06/2021.  Large polyp removed.  6 month follow up colonoscopy recommended.  Pt did see Dr. Valli.  Refuses to have another one at this time. - Bone mineral density 08/2021 - lab work done with PCP, Dr. Tonita - vaccines reviewed/updated  2. Erosion of bladder suspension mesh, initial encounter (HCC) - reached out to Dr. Gaston regariding this.  Will start vaginal estrogen cream, small amount to where erosion is located nightly (no DVT hx).  He is no longer doing surgery so will refer to urogyn for follow up.  3. Lichen sclerosus - no skin breakdown or thickening noted.  Feels she should increase the mometasone  daily for 14 - 21 days and then back to two to three times weekly - mometasone  (ELOCON ) 0.1 % ointment; Apply topically daily. Use daily for 2-3 weeks and then two to three times weekly.  Dispense: 45 g; Refill: 2  4. OAB (overactive bladder)  5. Osteopenia of necks of both femurs - will order BMD at next appt  6. History of adenomatous polyp of colon - declines additional follow up  7. Smoker

## 2023-03-22 ENCOUNTER — Telehealth (HOSPITAL_BASED_OUTPATIENT_CLINIC_OR_DEPARTMENT_OTHER): Payer: Self-pay | Admitting: *Deleted

## 2023-03-22 NOTE — Telephone Encounter (Signed)
 Called pt to follow up with her on reaction that is documented in chart to Prem-pro. Pt reports that she does not recall having a reaction. She reports that she has not used any other estrogen products. Pt reports that she has been using the estrogen cream prescribed by Dr. Cleotilde without any problems. Pt provided with appt for yearly exam as well.

## 2023-03-22 NOTE — Telephone Encounter (Signed)
-----   Message from Ronal GORMAN Pinal sent at 03/16/2023  1:12 PM EST ----- Regarding: f/u Luke, I saw this pt today.  She has new erosion of mid urethral sling mesh.  MacDiarmid put it in.  I talked with him.  He recommended vaginal estrogen cream.  When I put the order in for the pt after she left, it says she had hives to prem-pro.  Can you call her and see if she remembers this and if she's ever used any other estrogen products?  Also, I told her she could follow up in two years but Sherell was already gone for lunch (pt came late).  She has lichen sclerosus so I want to see her yearly.  Can you make appt for her?  Thanks.  Once I know about any other estrogen she's used, then I can send in new rx for her.  Thanks.  Elvie

## 2023-03-26 ENCOUNTER — Other Ambulatory Visit: Payer: Self-pay | Admitting: Nurse Practitioner

## 2023-03-26 DIAGNOSIS — E782 Mixed hyperlipidemia: Secondary | ICD-10-CM

## 2023-03-26 DIAGNOSIS — E1122 Type 2 diabetes mellitus with diabetic chronic kidney disease: Secondary | ICD-10-CM

## 2023-03-29 ENCOUNTER — Ambulatory Visit: Payer: Medicare Other | Admitting: Internal Medicine

## 2023-04-03 ENCOUNTER — Other Ambulatory Visit: Payer: Self-pay | Admitting: Internal Medicine

## 2023-04-03 DIAGNOSIS — K5900 Constipation, unspecified: Secondary | ICD-10-CM

## 2023-04-05 ENCOUNTER — Other Ambulatory Visit: Payer: Self-pay

## 2023-04-05 ENCOUNTER — Telehealth: Payer: Self-pay | Admitting: Neurology

## 2023-04-05 DIAGNOSIS — R413 Other amnesia: Secondary | ICD-10-CM

## 2023-04-05 DIAGNOSIS — G309 Alzheimer's disease, unspecified: Secondary | ICD-10-CM

## 2023-04-05 NOTE — Telephone Encounter (Signed)
will need completed 04-06-23 through 04/19/23 to continue infusions [facility] medicare NPR sent to GI [phone]

## 2023-04-08 ENCOUNTER — Encounter (HOSPITAL_BASED_OUTPATIENT_CLINIC_OR_DEPARTMENT_OTHER): Payer: Self-pay | Admitting: Obstetrics & Gynecology

## 2023-04-19 ENCOUNTER — Other Ambulatory Visit: Payer: Medicare Other

## 2023-04-27 ENCOUNTER — Ambulatory Visit
Admission: RE | Admit: 2023-04-27 | Discharge: 2023-04-27 | Disposition: A | Discharge status: Home / Self Care | Payer: Medicare Other | Source: Ambulatory Visit | Attending: Neurology | Admitting: Neurology

## 2023-04-27 DIAGNOSIS — R413 Other amnesia: Secondary | ICD-10-CM

## 2023-04-27 DIAGNOSIS — G309 Alzheimer's disease, unspecified: Secondary | ICD-10-CM | POA: Diagnosis not present

## 2023-04-27 DIAGNOSIS — G3184 Mild cognitive impairment, so stated: Secondary | ICD-10-CM

## 2023-04-27 MED ORDER — GADOPICLENOL 0.5 MMOL/ML IV SOLN
7.0000 mL | Freq: Once | INTRAVENOUS | Status: AC | PRN
Start: 2023-04-27 — End: 2023-04-27
  Administered 2023-04-27: 7 mL via INTRAVENOUS

## 2023-05-07 ENCOUNTER — Encounter (HOSPITAL_BASED_OUTPATIENT_CLINIC_OR_DEPARTMENT_OTHER): Payer: Self-pay | Admitting: *Deleted

## 2023-05-07 ENCOUNTER — Emergency Department (HOSPITAL_BASED_OUTPATIENT_CLINIC_OR_DEPARTMENT_OTHER)
Admission: EM | Admit: 2023-05-07 | Discharge: 2023-05-07 | Disposition: A | Discharge status: Home / Self Care | Payer: Medicare Other | Attending: Emergency Medicine | Admitting: Emergency Medicine

## 2023-05-07 ENCOUNTER — Emergency Department (HOSPITAL_BASED_OUTPATIENT_CLINIC_OR_DEPARTMENT_OTHER): Payer: Medicare Other | Admitting: Radiology

## 2023-05-07 ENCOUNTER — Other Ambulatory Visit: Payer: Self-pay

## 2023-05-07 DIAGNOSIS — R102 Pelvic and perineal pain: Secondary | ICD-10-CM

## 2023-05-07 DIAGNOSIS — M25551 Pain in right hip: Secondary | ICD-10-CM | POA: Diagnosis present

## 2023-05-07 DIAGNOSIS — N3000 Acute cystitis without hematuria: Secondary | ICD-10-CM | POA: Diagnosis not present

## 2023-05-07 LAB — BASIC METABOLIC PANEL
Anion gap: 6 (ref 5–15)
BUN: 19 mg/dL (ref 8–23)
CO2: 29 mmol/L (ref 22–32)
Calcium: 9.1 mg/dL (ref 8.9–10.3)
Chloride: 104 mmol/L (ref 98–111)
Creatinine, Ser: 0.75 mg/dL (ref 0.44–1.00)
GFR, Estimated: >60 mL/min (ref >60)
Glucose, Bld: 96 mg/dL (ref 70–99)
Potassium: 3.8 mmol/L (ref 3.5–5.1)
Sodium: 139 mmol/L (ref 135–145)

## 2023-05-07 LAB — URINALYSIS, ROUTINE W REFLEX MICROSCOPIC
Bilirubin Urine: NEGATIVE
Glucose, UA: NEGATIVE mg/dL
Hgb urine dipstick: NEGATIVE
Ketones, ur: NEGATIVE mg/dL
Nitrite: POSITIVE — AB
Protein, ur: NEGATIVE mg/dL
Specific Gravity, Urine: 1.015 (ref 1.005–1.030)
pH: 5 (ref 5.0–8.0)

## 2023-05-07 LAB — CBC
HCT: 41.4 % (ref 36.0–46.0)
Hemoglobin: 13.8 g/dL (ref 12.0–15.0)
MCH: 30.4 pg (ref 26.0–34.0)
MCHC: 33.3 g/dL (ref 30.0–36.0)
MCV: 91.2 fL (ref 80.0–100.0)
Platelets: 150 10*3/uL (ref 150–400)
RBC: 4.54 MIL/uL (ref 3.87–5.11)
RDW: 14.2 % (ref 11.5–15.5)
WBC: 5 10*3/uL (ref 4.0–10.5)
nRBC: 0 % (ref 0.0–0.2)

## 2023-05-07 MED ORDER — NITROFURANTOIN MONOHYD MACRO 100 MG PO CAPS: 100.0000 mg | ORAL_CAPSULE | Freq: Two times a day (BID) | ORAL | 0 refills | Status: DC

## 2023-05-07 MED ORDER — PHENAZOPYRIDINE HCL 200 MG PO TABS: 200.0000 mg | ORAL_TABLET | Freq: Three times a day (TID) | ORAL | 0 refills | Status: DC

## 2023-05-07 NOTE — ED Provider Notes (Signed)
 [place] EMERGENCY DEPARTMENT AT [facility] Provider Note   CSN: [id number] Arrival date & time: 05/07/23  4098     History  Chief Complaint  Patient presents with   Hip Pain    Beth [name] is a 73 y.o. female.  Patient presents to the emergency department today for evaluation of right hip and lower abdominal/pelvic pain for about 5 days.  Patient notes that the pain is worse at night, better during the day when she is up moving around.  It is not really changed or worsened with movement.  No fevers.  Patient has urinary incontinence at baseline and reports baseline urgency and frequency.  She does report some tenderness in the right lower quadrant.  She has a history of a cholecystectomy and orthopedic surgeries.  No vomiting or diarrhea.       Home Medications Prior to Admission medications   Medication Sig Start Date End Date Taking? Authorizing Provider  nitrofurantoin, macrocrystal-monohydrate, (MACROBID) 100 MG capsule Take 1 capsule (100 mg total) by mouth 2 (two) times daily. 05/07/23  Yes Renne Crigler, PA-C  phenazopyridine (PYRIDIUM) 200 MG tablet Take 1 tablet (200 mg total) by mouth 3 (three) times daily. 05/07/23  Yes Renne Crigler, PA-C  ALPRAZolam Prudy Feeler) 0.25 MG tablet Take 1/2 to 1 tablet 2 x / day if needed for anxiety. Patient taking differently: Take 0.25 mg by mouth 2 (two) times daily. 07/19/18   Lucky Cowboy, MD  B Complex-Biotin-FA (B-100 COMPLEX PO) Take by mouth.    [provider]  BIOTIN PO Take 1 tablet by mouth daily.    [provider]  Cholecalciferol (VITAMIN D) 50 MCG (2000 UT) CAPS Take 2,000 Units by mouth daily.    [provider]  Cyanocobalamin (B-12) 100 MCG TABS Take by mouth.    [provider]  estradiol (ESTRACE) 0.1 MG/GM vaginal cream Apply small amount every other day to area where the urethral sling is located. 03/16/23   Jerene Bears, MD  LINZESS 72 MCG capsule TAKE 1 CAPSULE(72  MCG) BY MOUTH DAILY BEFORE BREAKFAST 04/05/23   Adela Glimpse, NP  Magnesium 500 MG TABS Take 500 mg by mouth daily.    [provider]  milk thistle 175 MG tablet Take 175 mg by mouth daily.    [provider]  mometasone (ELOCON) 0.1 % ointment Apply topically daily. Use daily for 2-3 weeks and then two to three times weekly. 03/16/23   Jerene Bears, MD  Omega-3 Fatty Acids (FISH OIL OMEGA-3 PO) Take 1 capsule by mouth daily.    [provider]  rosuvastatin (CRESTOR) 10 MG tablet TAKE 1 TABLET BY MOUTH DAILY FOR CHOLESTEROL 03/26/23   Raynelle Dick, NP  terbinafine (LAMISIL) 250 MG tablet TAKE 1 TABLET BY MOUTH DAILY FOR 1 MONTH ON, THEN 1 MONTH OFF FOR NAIL FUNGUS. SKIP EVERY OTHER MONTH 03/04/23   Raynelle Dick, NP  TURMERIC PO Take by mouth.    [provider]  venlafaxine XR (EFFEXOR-XR) 150 MG 24 hr capsule Take 150 mg by mouth 2 (two) times daily.     [provider]  vitamin C (ASCORBIC ACID) 500 MG tablet Take 500 mg by mouth daily.    [provider]  vitamin E 180 MG (400 UNITS) capsule Take 400 Units by mouth daily.    [provider]  zinc gluconate 50 MG tablet Take 50 mg by mouth daily.    [provider]  Allergies    Penicillins, Sulfa antibiotics, Hydrocodone, Celebrex [celecoxib], Ciprofloxacin, Codeine, Levaquin [levofloxacin], and Motrin ib [ibuprofen]    Review of Systems   Review of Systems  Physical Exam Updated Vital Signs BP (!) 142/100   Pulse 78   Temp (!) 97.5 F (36.4 C) (Oral)   Resp 15   LMP 04/09/2009   SpO2 98%  Physical Exam Vitals and nursing note reviewed.  Constitutional:      General: She is not in acute distress.    Appearance: She is well-developed.  HENT:     Head: Normocephalic and atraumatic.     Right Ear: External ear normal.     Left Ear: External ear normal.     Nose: Nose normal.  Eyes:     Conjunctiva/sclera: Conjunctivae normal.   Cardiovascular:     Rate and Rhythm: Normal rate and regular rhythm.     Heart sounds: No murmur heard. Pulmonary:     Effort: No respiratory distress.     Breath sounds: No wheezing, rhonchi or rales.  Abdominal:     Palpations: Abdomen is soft.     Tenderness: There is abdominal tenderness in the right lower quadrant. There is no right CVA tenderness, left CVA tenderness, guarding or rebound.     Comments: Mild right lower quadrant tenderness and suprapubic tenderness without rebound or guarding.  Patient moving well, sitting up in bed.  Musculoskeletal:     Cervical back: Normal range of motion and neck supple.     Right hip: No bony tenderness. Normal range of motion.     Left hip: No bony tenderness. Normal range of motion.     Right knee: Normal range of motion. No tenderness.     Left knee: Normal range of motion. No tenderness.     Right lower leg: No edema.     Left lower leg: No edema.  Skin:    General: Skin is warm and dry.     Findings: No rash.  Neurological:     General: No focal deficit present.     Mental Status: She is alert. Mental status is at baseline.     Motor: No weakness.  Psychiatric:        Mood and Affect: Mood normal.     ED Results / Procedures / Treatments   Labs (all labs ordered are listed, but only abnormal results are displayed) Labs Reviewed  URINALYSIS, ROUTINE W REFLEX MICROSCOPIC - Abnormal; Notable for the following components:      Result Value   Nitrite POSITIVE (*)    Leukocytes,Ua LARGE (*)    Bacteria, UA MANY (*)    All other components within normal limits  URINE CULTURE  CBC  BASIC METABOLIC PANEL    EKG None  Radiology DG Hip Unilat W or Wo Pelvis 2-3 Views Right Result Date: 05/07/2023 CLINICAL DATA:  Right hip and pelvic pain. EXAM: DG HIP (WITH OR WITHOUT PELVIS) 2-3V RIGHT COMPARISON:  CT abdomen pelvis dated October 28, 2018. FINDINGS: There is no evidence of hip fracture or dislocation. There is no evidence of  arthropathy or other focal bone abnormality. IMPRESSION: Negative. Electronically Signed   By: Obie Dredge M.D.   On: 05/07/2023 11:43    Procedures Procedures    Medications Ordered in ED Medications - No data to display  ED Course/ Medical Decision Making/ A&P    Patient seen and examined. History obtained directly from patient. Work-up including labs, imaging, EKG ordered in triage, if  performed, were reviewed.    Labs/EKG: Independently reviewed and interpreted.  This included: CBC unremarkable; BMP unremarkable; UA suggestive of infection with positive nitrite, large leuk esterase, 2150 white cells with many bacteria, clean-catch.  Urine culture sent.  Imaging: X-ray of the hip, agree negative.  Medications/Fluids: None ordered  Most recent vital signs reviewed and are as follows: BP (!) 142/100   Pulse 78   Temp (!) 97.5 F (36.4 C) (Oral)   Resp 15   LMP 04/09/2009   SpO2 98%   Initial impression: Patient with right lower abdominal pain with radiation to the right hip and flank.  Overall mild.  Patient looks well with normal vitals.  Labs are reassuring.  Will treat for UTI.  Home treatment plan: Patient with multiple allergies to antibiotics including penicillins (anaphylactic reaction reported as a child), sulfa drugs, Cipro, levofloxacin.  Given this we will treat with 5 days of nitrofurantoin.  Patient does not have signs of pyelonephritis at this time.  Return instructions discussed with patient: The patient was urged to return to the Emergency Department immediately with worsening of current symptoms, worsening abdominal pain, persistent vomiting, blood noted in stools, fever, or any other concerns. The patient verbalized understanding.   Follow-up instructions discussed with patient: Follow-up with PCP next week if not improving.                                Medical Decision Making Amount and/or Complexity of Data Reviewed Labs: ordered. Radiology:  ordered.  Risk Prescription drug management.   For this patient's complaint of abdominal pain, the following conditions were considered on the differential diagnosis: gastritis/PUD, enteritis/duodenitis, appendicitis, cholelithiasis/cholecystitis, cholangitis, pancreatitis, ruptured viscus, colitis, diverticulitis, small/large bowel obstruction, proctitis, cystitis, pyelonephritis, ureteral colic, aortic dissection, aortic aneurysm. In women, ectopic pregnancy, pelvic inflammatory disease, ovarian cysts, and tubo-ovarian abscess were also considered. Atypical chest etiologies were also considered including ACS, PE, and pneumonia.  Most likely UTI related.  Low concern for pyelonephritis.  Low concern for appendicitis.  Do not suspect septic arthritis as patient has good range of motion of her hip.  The patient's vital signs, pertinent lab work and imaging were reviewed and interpreted as discussed in the ED course. Hospitalization was considered for further testing, treatments, or serial exams/observation. However as patient is well-appearing, has a stable exam, and reassuring studies today, I do not feel that they warrant admission at this time. This plan was discussed with the patient who verbalizes agreement and comfort with this plan and seems reliable and able to return to the Emergency Department with worsening or changing symptoms.          Final Clinical Impression(s) / ED Diagnoses Final diagnoses:  Pain of right hip  Suprapubic pain  Acute cystitis without hematuria    Rx / DC Orders ED Discharge Orders          Ordered    nitrofurantoin, macrocrystal-monohydrate, (MACROBID) 100 MG capsule  2 times daily        05/07/23 1444    phenazopyridine (PYRIDIUM) 200 MG tablet  3 times daily        05/07/23 1444              Renne Crigler, PA-C 05/07/23 1459    Rolan Bucco, MD 05/08/23 [phone]

## 2023-05-07 NOTE — ED Triage Notes (Signed)
 Pt is here for right hip and pelvic pain which began 4-5 days ago.  Pt states that she is having urgency and frequency and lower back pain.

## 2023-05-07 NOTE — Discharge Instructions (Signed)
 Please read and follow all provided instructions.  Your diagnoses today include:  1. Pain of right hip   2. Suprapubic pain   3. Acute cystitis without hematuria     Tests performed today include: Blood cell counts and platelets: Were normal Basic metabolic panel: Was normal Urine test to look for infection: suggest urinary infection Urine culture: Pending Vital signs. See below for your results today.   Medications prescribed:  Macrobid - antibiotic for urinary tract infection  You have been prescribed an antibiotic medicine: take the entire course of medicine even if you are feeling better. Stopping early can cause the antibiotic not to work.  Take any prescribed medications only as directed.  Home care instructions:  Follow any educational materials contained in this packet.  Follow-up instructions: Please follow-up with your primary care provider in the next 3 days for further evaluation of your symptoms.    Return instructions:  SEEK IMMEDIATE MEDICAL ATTENTION IF: The pain does not go away or becomes severe  A temperature above 101F develops  Repeated vomiting occurs (multiple episodes)  The pain becomes localized to portions of the abdomen. The right side could possibly be appendicitis. In an adult, the left lower portion of the abdomen could be colitis or diverticulitis.  Blood is being passed in stools or vomit (bright red or black tarry stools)  You develop chest pain, difficulty breathing, dizziness or fainting, or become confused, poorly responsive, or inconsolable (young children) If you have any other emergent concerns regarding your health  Additional Information: Abdominal (belly) pain can be caused by many things. Your caregiver performed an examination and possibly ordered blood/urine tests and imaging (CT scan, x-rays, ultrasound). Many cases can be observed and treated at home after initial evaluation in the emergency department. Even though you are being  discharged home, abdominal pain can be unpredictable. Therefore, you need a repeated exam if your pain does not resolve, returns, or worsens. Most patients with abdominal pain don't have to be admitted to the hospital or have surgery, but serious problems like appendicitis and gallbladder attacks can start out as nonspecific pain. Many abdominal conditions cannot be diagnosed in one visit, so follow-up evaluations are very important.  Your vital signs today were: BP (!) 142/100   Pulse 78   Temp (!) 97.5 F (36.4 C) (Oral)   Resp 15   LMP 04/09/2009   SpO2 98%  If your blood pressure (bp) was elevated above 135/85 this visit, please have this repeated by your doctor within one month. --------------

## 2023-05-07 NOTE — ED Notes (Signed)
 Spoke with lab about urine culture add on.

## 2023-05-09 ENCOUNTER — Other Ambulatory Visit: Payer: Self-pay

## 2023-05-09 ENCOUNTER — Emergency Department (HOSPITAL_BASED_OUTPATIENT_CLINIC_OR_DEPARTMENT_OTHER)
Admission: EM | Admit: 2023-05-09 | Discharge: 2023-05-09 | Disposition: A | Discharge status: Home / Self Care | Attending: Emergency Medicine | Admitting: Emergency Medicine

## 2023-05-09 ENCOUNTER — Encounter (HOSPITAL_BASED_OUTPATIENT_CLINIC_OR_DEPARTMENT_OTHER): Payer: Self-pay

## 2023-05-09 DIAGNOSIS — M25551 Pain in right hip: Secondary | ICD-10-CM | POA: Insufficient documentation

## 2023-05-09 DIAGNOSIS — D72829 Elevated white blood cell count, unspecified: Secondary | ICD-10-CM | POA: Diagnosis not present

## 2023-05-09 DIAGNOSIS — E119 Type 2 diabetes mellitus without complications: Secondary | ICD-10-CM | POA: Diagnosis not present

## 2023-05-09 DIAGNOSIS — J449 Chronic obstructive pulmonary disease, unspecified: Secondary | ICD-10-CM | POA: Insufficient documentation

## 2023-05-09 DIAGNOSIS — I10 Essential (primary) hypertension: Secondary | ICD-10-CM | POA: Insufficient documentation

## 2023-05-09 LAB — CBC
HCT: 45.3 % (ref 36.0–46.0)
Hemoglobin: 14.8 g/dL (ref 12.0–15.0)
MCH: 29.7 pg (ref 26.0–34.0)
MCHC: 32.7 g/dL (ref 30.0–36.0)
MCV: 91 fL (ref 80.0–100.0)
Platelets: 177 10*3/uL (ref 150–400)
RBC: 4.98 MIL/uL (ref 3.87–5.11)
RDW: 14.1 % (ref 11.5–15.5)
WBC: 6.5 10*3/uL (ref 4.0–10.5)
nRBC: 0 % (ref 0.0–0.2)

## 2023-05-09 LAB — COMPREHENSIVE METABOLIC PANEL
ALT: 29 U/L (ref 0–44)
AST: 37 U/L (ref 15–41)
Albumin: 3.7 g/dL (ref 3.5–5.0)
Alkaline Phosphatase: 123 U/L (ref 38–126)
Anion gap: 10 (ref 5–15)
BUN: 18 mg/dL (ref 8–23)
CO2: 25 mmol/L (ref 22–32)
Calcium: 9.1 mg/dL (ref 8.9–10.3)
Chloride: 101 mmol/L (ref 98–111)
Creatinine, Ser: 0.77 mg/dL (ref 0.44–1.00)
GFR, Estimated: >60 mL/min (ref >60)
Glucose, Bld: 98 mg/dL (ref 70–99)
Potassium: 3.8 mmol/L (ref 3.5–5.1)
Sodium: 136 mmol/L (ref 135–145)
Total Bilirubin: 0.6 mg/dL (ref 0.0–1.2)
Total Protein: 7.1 g/dL (ref 6.5–8.1)

## 2023-05-09 LAB — URINE CULTURE: Culture: >=100000 — AB

## 2023-05-09 LAB — URINALYSIS, ROUTINE W REFLEX MICROSCOPIC
Bilirubin Urine: NEGATIVE
Glucose, UA: 100 mg/dL — AB
Hgb urine dipstick: NEGATIVE
Ketones, ur: NEGATIVE mg/dL
Nitrite: POSITIVE — AB
Protein, ur: NEGATIVE mg/dL
Specific Gravity, Urine: 1.01 (ref 1.005–1.030)
pH: 5.5 (ref 5.0–8.0)

## 2023-05-09 LAB — URINALYSIS, MICROSCOPIC (REFLEX)

## 2023-05-09 LAB — LIPASE, BLOOD: Lipase: 23 U/L (ref 11–51)

## 2023-05-09 NOTE — ED Provider Notes (Signed)
 [place] EMERGENCY DEPARTMENT AT [facility] Provider Note   CSN: [id number] Arrival date & time: 05/09/23  1127     History  Chief Complaint  Patient presents with   Abdominal Pain    Beth [name] is a 73 y.o. female.   Abdominal Pain Patient is a 73 year old female presents the ED today complaining of a 5-day history of right hip pain radiating to right back.  She is previously seen in the ED 2 days ago has returned today due to concerns that it might be underlying appendicitis or nephrolithiasis causing the symptoms.  She states that the symptoms improve over the course of the day with movement.  States that she is working out regularly in the gym 3 times a week with trainer and that the pain started after she worked out.  Currently being treated for UTI.  Denies fever, headache, vision changes, chest pain, shortness of breath, abdominal pain, nausea, vomiting, diarrhea, hematochezia, melena, dysuria, hematuria, lower extremity swelling     Home Medications Prior to Admission medications   Medication Sig Start Date End Date Taking? Authorizing Provider  ALPRAZolam Prudy Feeler) 0.25 MG tablet Take 1/2 to 1 tablet 2 x / day if needed for anxiety. Patient taking differently: Take 0.25 mg by mouth 2 (two) times daily. 07/19/18   Lucky Cowboy, MD  B Complex-Biotin-FA (B-100 COMPLEX PO) Take by mouth.    [provider]  BIOTIN PO Take 1 tablet by mouth daily.    [provider]  Cholecalciferol (VITAMIN D) 50 MCG (2000 UT) CAPS Take 2,000 Units by mouth daily.    [provider]  Cyanocobalamin (B-12) 100 MCG TABS Take by mouth.    [provider]  estradiol (ESTRACE) 0.1 MG/GM vaginal cream Apply small amount every other day to area where the urethral sling is located. 03/16/23   Jerene Bears, MD  LINZESS 72 MCG capsule TAKE 1 CAPSULE(72 MCG) BY MOUTH DAILY BEFORE BREAKFAST 04/05/23   Adela Glimpse, NP  Magnesium 500 MG TABS Take  500 mg by mouth daily.    [provider]  milk thistle 175 MG tablet Take 175 mg by mouth daily.    [provider]  mometasone (ELOCON) 0.1 % ointment Apply topically daily. Use daily for 2-3 weeks and then two to three times weekly. 03/16/23   Jerene Bears, MD  nitrofurantoin, macrocrystal-monohydrate, (MACROBID) 100 MG capsule Take 1 capsule (100 mg total) by mouth 2 (two) times daily. 05/07/23   Renne Crigler, PA-C  Omega-3 Fatty Acids (FISH OIL OMEGA-3 PO) Take 1 capsule by mouth daily.    [provider]  phenazopyridine (PYRIDIUM) 200 MG tablet Take 1 tablet (200 mg total) by mouth 3 (three) times daily. 05/07/23   Renne Crigler, PA-C  rosuvastatin (CRESTOR) 10 MG tablet TAKE 1 TABLET BY MOUTH DAILY FOR CHOLESTEROL 03/26/23   Raynelle Dick, NP  terbinafine (LAMISIL) 250 MG tablet TAKE 1 TABLET BY MOUTH DAILY FOR 1 MONTH ON, THEN 1 MONTH OFF FOR NAIL FUNGUS. SKIP EVERY OTHER MONTH 03/04/23   Raynelle Dick, NP  TURMERIC PO Take by mouth.    [provider]  venlafaxine XR (EFFEXOR-XR) 150 MG 24 hr capsule Take 150 mg by mouth 2 (two) times daily.     [provider]  vitamin C (ASCORBIC ACID) 500 MG tablet Take 500 mg by mouth daily.    [provider]  vitamin E 180 MG (400 UNITS) capsule Take 400 Units  by mouth daily.    [provider]  zinc gluconate 50 MG tablet Take 50 mg by mouth daily.    [provider]      Allergies    Penicillins, Sulfa antibiotics, Hydrocodone, Celebrex [celecoxib], Ciprofloxacin, Codeine, Levaquin [levofloxacin], and Motrin ib [ibuprofen]    Review of Systems   Review of Systems  Musculoskeletal:  Positive for arthralgias and back pain.  All other systems reviewed and are negative.   Physical Exam Updated Vital Signs BP (!) 154/88   Pulse 87   Temp (!) 97.2 F (36.2 C)   Resp 16   Wt 75.8 kg   LMP 04/09/2009   SpO2 99%   BMI 27.79 kg/m  Physical Exam Vitals and  nursing note reviewed.  Constitutional:      General: She is not in acute distress.    Appearance: Normal appearance. She is not ill-appearing.  HENT:     Head: Normocephalic and atraumatic.  Eyes:     Extraocular Movements: Extraocular movements intact.     Conjunctiva/sclera: Conjunctivae normal.  Cardiovascular:     Rate and Rhythm: Normal rate and regular rhythm.     Pulses: Normal pulses.     Heart sounds: Normal heart sounds. No murmur heard.    No friction rub. No gallop.  Pulmonary:     Effort: Pulmonary effort is normal. No respiratory distress.     Breath sounds: Normal breath sounds. No stridor. No wheezing or rales.  Abdominal:     General: Abdomen is flat. There is no distension or abdominal bruit. There are no signs of injury.     Palpations: Abdomen is soft.     Tenderness: There is no abdominal tenderness. There is no right CVA tenderness, left CVA tenderness, guarding or rebound. Negative signs include Murphy's sign, Rovsing's sign, McBurney's sign, psoas sign and obturator sign.  Musculoskeletal:        General: No swelling, tenderness, deformity or signs of injury.     Right lower leg: No edema.     Left lower leg: No edema.     Comments: Pain was able to be elicited with hip flexion on right side.  Pain noted to be in the hip joint.  Not the abdomen.  Skin:    General: Skin is warm and dry.  Neurological:     General: No focal deficit present.     Mental Status: She is alert. Mental status is at baseline.  Psychiatric:        Mood and Affect: Mood normal.     ED Results / Procedures / Treatments   Labs (all labs ordered are listed, but only abnormal results are displayed) Labs Reviewed  URINALYSIS, ROUTINE W REFLEX MICROSCOPIC - Abnormal; Notable for the following components:      Result Value   Color, Urine ORANGE (*)    Glucose, UA 100 (*)    Nitrite POSITIVE (*)    Leukocytes,Ua TRACE (*)    All other components within normal limits  URINALYSIS,  MICROSCOPIC (REFLEX) - Abnormal; Notable for the following components:   Bacteria, UA FEW (*)    All other components within normal limits  LIPASE, BLOOD  COMPREHENSIVE METABOLIC PANEL  CBC    EKG None  Radiology No results found.  Procedures Procedures    Medications Ordered in ED Medications - No data to display  ED Course/ Medical Decision Making/ A&P  Medical Decision Making Amount and/or Complexity of Data Reviewed Labs: ordered.   Patient is a 73 year old female presents the ED today complaining of a 5-day history of right hip pain radiating to right back.  She is previously seen in the ED 2 days ago has returned today due to concerns that it might be underlying appendicitis or nephrolithiasis causing the symptoms.  She states that the symptoms improve over the course of the day with movement.  States that she is working out regularly in the gym 3 times a week with trainer and that the pain started after she worked out.  Currently being treated for UTI.  Denies fever, headache, vision changes, chest pain, shortness of breath, abdominal pain, nausea, vomiting, diarrhea, hematochezia, melena, dysuria, hematuria, lower extremity swelling.  On physical exam, patient was noted to have negative McBurney's point, negative Rovsing, negative psoas sign, negative Murphy sign, no CVA tenderness.  Afebrile, no acute distress, resting comfortably.  Was noted to have mild tenderness with hip flexion at the hip crease of right hip.  Discussed with patient's that this is most likely musculoskeletal pain and does not require any further imaging as x-rays did not show any acute fracture or dislocation when previously seen in the ED 2 days ago.  Low suspicion for appendicitis, nephrolithiasis at this time with labs and fetal exam both very reassuring.  Discussed possible imaging if patient wished to have further evaluation for possible nephrolithiasis however  patient refused.  Patient expressed agreement and understanding of plan to go home and manage pain at home with over-the-counter medications as well as to follow with PCP for possible PT.  Provided strict return to ED precautions.  I believe this patient is to be discharged at this time.  Differential diagnoses prior to evaluation: The emergent differential diagnosis includes, but is not limited to, appendicitis, diverticulitis, hepatitis, gastritis, enteritis, arthritis, dislocation, fracture, spinal stenosis, nephrolithiasis, UTI. This is not an exhaustive differential.   Past Medical History / Co-morbidities / Social History: HTN, tachycardia diabetes, IBS, COPD,  Additional history: Chart reviewed. Pertinent results include:   Seen on 05/07/2023 for same symptoms.  Diagnosed with UTI and treated with Pyridium and nitrofurantoin.  Lab Tests/Imaging studies: I personally interpreted labs/imaging and the pertinent results include:   CBC unremarkable UA notable for positive nitrites and trace leukocytes, concurrent with UTI noted in previous visit and is current undergoing treatment. Lipase unremarkable, CMP unremarkable    Medications: No medications needed or required at this time.  I have reviewed the patients home medicines and have made adjustments as needed.    Disposition: After consideration of the diagnostic results and the patients response to treatment, I feel that the patient benefit from discharge treatment as above.  emergency department workup does not suggest an emergent condition requiring admission or immediate intervention beyond what has been performed at this time. The plan is: Continue with current UTI treatment, follow-up with PCP, possible PT, return for any new or worsening symptoms. The patient is safe for discharge and has been instructed to return immediately for worsening symptoms, change in symptoms or any other concerns.  Final Clinical Impression(s) / ED  Diagnoses Final diagnoses:  Pain of right hip    Rx / DC Orders ED Discharge Orders     None         Lunette Stands, PA-C 05/09/23 1326    Rolan Bucco, MD 05/09/23 1445

## 2023-05-09 NOTE — Discharge Instructions (Addendum)
 You were seen today for right hip pain.  I do not believe that this is related to your abdomen as pain seems to be located mainly in the hip crease.  Your physical exam and labs are both very reassuring.  Recommend you continue with current treatment plan for UTI as you are still positive for UTI at this time.  I would recommend that you follow-up with your PCP for further evaluation/possible

## 2023-05-09 NOTE — ED Triage Notes (Addendum)
 Pt reports right lower abdominal pain that radiates to her right hip/back and down leg.Pain increased the last 5 days, but has been present for 2 weeks.  Pt seen at [facility] yesterday and dx with kidney infection and was xrayed without fx. Pt reports pain worse at bedtime. Pain better during day while up and moving Pt is incontinent of urine which is her normal

## 2023-05-10 ENCOUNTER — Ambulatory Visit: Payer: Self-pay

## 2023-05-10 ENCOUNTER — Telehealth (HOSPITAL_BASED_OUTPATIENT_CLINIC_OR_DEPARTMENT_OTHER): Payer: Self-pay | Admitting: *Deleted

## 2023-05-10 NOTE — Telephone Encounter (Signed)
 Post ED Visit - Positive Culture Follow-up  Culture report reviewed by antimicrobial stewardship pharmacist: [facility] []  Enzo Bi, Pharm.D. []  Celedonio Miyamoto, Pharm.D., BCPS AQ-ID []  Garvin Fila, Pharm.D., BCPS []  Georgina Pillion, Pharm.D., BCPS []  [place], [address].D., BCPS, AAHIVP []  Estella Husk, Pharm.D., BCPS, AAHIVP []  Lysle Pearl, PharmD, BCPS []  Phillips Climes, PharmD, BCPS []  Agapito Games, PharmD, BCPS []  Verlan Friends, PharmD []  Mervyn Gay, PharmD, BCPS [x]  T, Rudisilll, PharmD  [facility] []  Len Childs, PharmD []  Greer Pickerel, PharmD []  Adalberto Cole, PharmD []  Perlie Gold, Rph []  Lonell Face) Jean Rosenthal, PharmD []  Earl Many, PharmD []  Junita Push, PharmD []  Dorna Leitz, PharmD []  Terrilee Files, PharmD []  Lynann Beaver, PharmD []  Keturah Barre, PharmD []  Loralee Pacas, PharmD []  Bernadene Person, PharmD   Positive urine culture Treated with Niitrofurantoin, organism sensitive to the same and no further patient follow-up is required at this time.  Patsey Berthold 05/10/2023, 8:26 AM

## 2023-05-11 ENCOUNTER — Encounter: Payer: Self-pay | Admitting: Neurology

## 2023-05-13 ENCOUNTER — Ambulatory Visit: Admitting: Urgent Care

## 2023-05-14 ENCOUNTER — Ambulatory Visit: Admitting: Urgent Care

## 2023-05-20 ENCOUNTER — Ambulatory Visit (HOSPITAL_BASED_OUTPATIENT_CLINIC_OR_DEPARTMENT_OTHER): Payer: Medicare Other | Admitting: Family Medicine

## 2023-05-20 ENCOUNTER — Encounter (HOSPITAL_BASED_OUTPATIENT_CLINIC_OR_DEPARTMENT_OTHER): Payer: Self-pay | Admitting: Family Medicine

## 2023-05-20 VITALS — BP 140/90 | HR 81 | Ht 65.0 in | Wt 159.8 lb

## 2023-05-20 DIAGNOSIS — E559 Vitamin D deficiency, unspecified: Secondary | ICD-10-CM

## 2023-05-20 DIAGNOSIS — E119 Type 2 diabetes mellitus without complications: Secondary | ICD-10-CM

## 2023-05-20 DIAGNOSIS — I10 Essential (primary) hypertension: Secondary | ICD-10-CM | POA: Diagnosis not present

## 2023-05-20 DIAGNOSIS — E1169 Type 2 diabetes mellitus with other specified complication: Secondary | ICD-10-CM | POA: Diagnosis not present

## 2023-05-20 DIAGNOSIS — Z0000 Encounter for general adult medical examination without abnormal findings: Secondary | ICD-10-CM

## 2023-05-20 DIAGNOSIS — K760 Fatty (change of) liver, not elsewhere classified: Secondary | ICD-10-CM | POA: Diagnosis not present

## 2023-05-20 DIAGNOSIS — E785 Hyperlipidemia, unspecified: Secondary | ICD-10-CM

## 2023-05-20 DIAGNOSIS — R319 Hematuria, unspecified: Secondary | ICD-10-CM

## 2023-05-20 LAB — URINALYSIS, COMPLETE
Bilirubin, UA: NEGATIVE
Glucose, UA: NEGATIVE
Ketones, UA: NEGATIVE
Nitrite, UA: NEGATIVE
Protein,UA: NEGATIVE
RBC, UA: NEGATIVE
Specific Gravity, UA: 1.013 (ref 1.005–1.030)
Urobilinogen, Ur: 0.2 mg/dL (ref 0.2–1.0)
pH, UA: 5.5 (ref 5.0–7.5)

## 2023-05-20 LAB — MICROSCOPIC EXAMINATION
Bacteria, UA: NONE SEEN
Casts: NONE SEEN /LPF

## 2023-05-20 NOTE — Progress Notes (Unsigned)
 New Patient Office Visit  Subjective   Patient ID: Beth [name], female    DOB: 08-Sep-1950  Age: 73 y.o. MRN: [id number]  CC:  Chief Complaint  Patient presents with   New Patient (Initial Visit)    Patient states she had a UTI, has finished meds that were prescribed and wants to know if it is gone. Also has hip and leg pain.    HPI ADAYSIA FAIDLEY presents to establish care Last PCP - Dr. McKeown  Vit D def: Has been taking vitamin D  supplement, most recent vitamin D  labs were 7 months ago and vitamin D  was normal at that time.  OSA, COPD: Currently on CPAP.  No current inhalers for treatment of underlying COPD.  She has had prior CT scan for lung cancer screening which did show changes of emphysema.  DM: No current medications, most recent A1c 5.5%.  HLD: Currently taking statin, denies any issues with medication.  Most recent cholesterol panel with good control.  Alzheimer's disease:  Patient is originally from [facility], has lived here since 1974. She did go to college locally. She enjoys exercising, antique shopping.  Outpatient Encounter Medications as of 05/20/2023  Medication Sig   ALPRAZolam  (XANAX ) 0.25 MG tablet Take 1/2 to 1 tablet 2 x / day if needed for anxiety. (Patient taking differently: Take 0.25 mg by mouth 2 (two) times daily.)   B Complex-Biotin-FA (B-100 COMPLEX PO) Take by mouth.   BIOTIN PO Take 1 tablet by mouth daily.   Cholecalciferol (VITAMIN D ) 50 MCG (2000 UT) CAPS Take 2,000 Units by mouth daily.   Cyanocobalamin (B-12) 100 MCG TABS Take by mouth.   Magnesium 500 MG TABS Take 500 mg by mouth daily.   milk thistle 175 MG tablet Take 175 mg by mouth daily.   Omega-3 Fatty Acids (FISH OIL OMEGA-3 PO) Take 1 capsule by mouth daily.   rosuvastatin  (CRESTOR ) 10 MG tablet TAKE 1 TABLET BY MOUTH DAILY FOR CHOLESTEROL   TURMERIC PO Take by mouth.   venlafaxine  XR (EFFEXOR -XR) 150 MG 24 hr capsule Take 150 mg by mouth 2 (two) times daily.    vitamin C  (ASCORBIC ACID) 500 MG tablet Take 500 mg by mouth daily.   vitamin E 180 MG (400 UNITS) capsule Take 400 Units by mouth daily.   zinc gluconate 50 MG tablet Take 50 mg by mouth daily.   [DISCONTINUED] LINZESS  72 MCG capsule TAKE 1 CAPSULE(72 MCG) BY MOUTH DAILY BEFORE BREAKFAST   [DISCONTINUED] estradiol  (ESTRACE ) 0.1 MG/GM vaginal cream Apply small amount every other day to area where the urethral sling is located. (Patient not taking: Reported on 07/01/2023)   [DISCONTINUED] mometasone  (ELOCON ) 0.1 % ointment Apply topically daily. Use daily for 2-3 weeks and then two to three times weekly.   [DISCONTINUED] nitrofurantoin , macrocrystal-monohydrate, (MACROBID ) 100 MG capsule Take 1 capsule (100 mg total) by mouth 2 (two) times daily.   [DISCONTINUED] phenazopyridine  (PYRIDIUM ) 200 MG tablet Take 1 tablet (200 mg total) by mouth 3 (three) times daily.   [DISCONTINUED] terbinafine  (LAMISIL ) 250 MG tablet TAKE 1 TABLET BY MOUTH DAILY FOR 1 MONTH ON, THEN 1 MONTH OFF FOR NAIL FUNGUS. SKIP EVERY OTHER MONTH   No facility-administered encounter medications on file as of 05/20/2023.    Past Medical History:  Diagnosis Date   Abscessed tooth    Alzheimer dementia (HCC)    Anemia    Anxiety    Arthritis    Bell's palsy 01/1985   COPD (  chronic obstructive pulmonary disease) (HCC)    Depression    Diabetes mellitus without complication (HCC)    Femur fracture, left (HCC)    Fibroid uterus 12/20/2014   7cm, 3.4cm and 1.9cm fibroids    Gallstones    GERD (gastroesophageal reflux disease)    Hyperlipidemia    Hypertension    IBS (irritable bowel syndrome) 02/1988   Obesity hypoventilation syndrome (HCC) 11/19/2014   Obesity, Class III, BMI 40-49.9 (morbid obesity)    OSA (obstructive sleep apnea)    no cpap   Osteopenia    Rotator cuff tear 2023   with bicep tear   Sleep apnea    resolved 25 years ago   Vitamin D  deficiency     Past Surgical History:  Procedure Laterality Date    BLADDER SUSPENSION  03/2006, 07/2006, 01/2007   for stress incontinence   CHOLECYSTECTOMY N/A 12/08/2018   Procedure: LAPAROSCOPIC CHOLECYSTECTOMY;  Surgeon: Oza Blumenthal, MD;  Location: [facility] OR;  Service: General;  Laterality: N/A;   COLONOSCOPY  05/2005   Normal, recheck 10 yrs   COLONOSCOPY WITH PROPOFOL  N/A 06/27/2021   Procedure: COLONOSCOPY WITH PROPOFOL ;  Surgeon: Alvis Jourdain, MD;  Location: [facility];  Service: Endoscopy;  Laterality: N/A;   HEMOSTASIS CLIP PLACEMENT  06/27/2021   Procedure: HEMOSTASIS CLIP PLACEMENT;  Surgeon: Alvis Jourdain, MD;  Location: [facility];  Service: Endoscopy;;   JOINT REPLACEMENT  2012.2014.2020   right knee   LUMBAR FUSION Right 09/2016   L4-5 fusion by Dr. Waymond Hailey   POLYPECTOMY  06/27/2021   Procedure: POLYPECTOMY;  Surgeon: Alvis Jourdain, MD;  Location: [facility];  Service: Endoscopy;;   SPINE SURGERY  2018   TONSILLECTOMY AND ADENOIDECTOMY     TOTAL KNEE ARTHROPLASTY Left 2012   x 2 left knee   TOTAL KNEE REVISION  2014   TOTAL KNEE REVISION Left 08/2018   Dr King Penning, [facility]    Family History  Problem Relation Age of Onset   Dementia Mother    Arthritis Mother    Depression Mother    Hypertension Father    Diabetes Father    Parkinson's disease Father    Obesity Father    Stroke Father    Heart attack Paternal Grandfather 5       Died MI   Colon cancer Neg Hx    Esophageal cancer Neg Hx    Pancreatic cancer Neg Hx    Stomach cancer Neg Hx     Social History   Socioeconomic History   Marital status: Divorced    Spouse name: Not on file   Number of children: 1   Years of education: Not on file   Highest education level: Bachelor's degree (e.g., BA, AB, BS)  Occupational History    Employer: RETIRED  Tobacco Use   Smoking status: Every Day    Current packs/day: 0.75    Average packs/day: 0.8 packs/day for 52.4 years (39.3 ttl pk-yrs)    Types: Cigarettes    Start date: 03/10/1971    Passive  exposure: Current   Smokeless tobacco: Never   Tobacco comments:    1 pack/day from age [age], now down to 0.5  Vaping Use   Vaping status: Never Used  Substance and Sexual Activity   Alcohol use: No   Drug use: No   Sexual activity: Not Currently    Partners: Male    Birth control/protection: Post-menopausal  Other Topics Concern   Not on file  Social History Narrative  Lives alone.     Right handed   Caffeine 1 cup daily    Retired Engineer, civil (consulting)   Social Drivers of Corporate investment banker Strain: Low Risk  (05/16/2023)   Overall Financial Resource Strain (CARDIA)    Difficulty of Paying Living Expenses: Not hard at all  Food Insecurity: No Food Insecurity (05/16/2023)   Hunger Vital Sign    Worried About Running Out of Food in the Last Year: Never true    Ran Out of Food in the Last Year: Never true  Transportation Needs: No Transportation Needs (05/16/2023)   PRAPARE - Administrator, Civil Service (Medical): No    Lack of Transportation (Non-Medical): No  Physical Activity: Insufficiently Active (05/16/2023)   Exercise Vital Sign    Days of Exercise per Week: 3 days    Minutes of Exercise per Session: 40 min  Stress: No Stress Concern Present (05/16/2023)   [employer] of Occupational Health - Occupational Stress Questionnaire    Feeling of Stress : Only a little  Social Connections: Moderately Isolated (05/16/2023)   Social Connection and Isolation Panel [NHANES]    Frequency of Communication with Friends and Family: More than three times a week    Frequency of Social Gatherings with Friends and Family: Once a week    Attends Religious Services: Never    Database administrator or Organizations: Yes    Attends Banker Meetings: 1 to 4 times per year    Marital Status: Divorced  Catering manager Violence: Not At Risk (05/20/2023)   Humiliation, Afraid, Rape, and Kick questionnaire    Fear of Current or Ex-Partner: No    Emotionally Abused: No     Physically Abused: No    Sexually Abused: No    Objective   BP (!) 140/90 (BP Location: Left Arm, Patient Position: Sitting, Cuff Size: Normal)   Pulse 81   Ht 5\' 5"  (1.651 m)   Wt 159 lb 12.8 oz (72.5 kg)   LMP 04/09/2009   SpO2 100%   BMI 26.59 kg/m   Physical Exam  73 year old female in no acute distress Cardiovascular exam with regular rate and rhythm Lungs clear to auscultation bilaterally  Assessment & Plan:   Hepatic steatosis  Hematuria, unspecified type -     Urinalysis, Complete  Essential hypertension  Hyperlipidemia associated with type 2 diabetes mellitus (HCC)  Type 2 diabetes mellitus without complication, without long-term current use of insulin  (HCC)  Other orders -     Microscopic Examination  Return in about 7 months (around 12/20/2023) for CPE with fasting labs 1 week prior.    ___________________________________________ Zyren Sevigny de [place], MD, ABFM, CAQSM Primary Care and Sports Medicine [facility]

## 2023-05-20 NOTE — Patient Instructions (Signed)
  Medication Instructions:  Your physician recommends that you continue on your current medications as directed. Please refer to the Current Medication list given to you today. --If you need a refill on any your medications before your next appointment, please call your pharmacy first. If no refills are authorized on file call the office.-- Lab Work: Your physician has recommended that you have lab work today: 1 week before next visit  If you have labs (blood work) drawn today and your tests are completely normal, you will receive your results via MyChart message OR a phone call from our staff.  Please ensure you check your voicemail in the event that you authorized detailed messages to be left on a delegated number. If you have any lab test that is abnormal or we need to change your treatment, we will call you to review the results.    Follow-Up: Your next appointment:   Your physician recommends that you schedule a follow-up appointment in: 7 months physical with Dr. de [place]  You will receive a text message or e-mail with a link to a survey about your care and experience with [place] today! We would greatly appreciate your feedback!   Thanks for letting us be apart of your health journey!!  Primary Care and Sports Medicine   Dr. Ceasar Mons [place]   We encourage you to activate your patient portal called "MyChart".  Sign up information is provided on this After Visit Summary.  MyChart is used to connect with patients for Virtual Visits (Telemedicine).  Patients are able to view lab/test results, encounter notes, upcoming appointments, etc.  Non-urgent messages can be sent to your provider as well. To learn more about what you can do with MyChart, please visit --  [email or web address].

## 2023-05-23 ENCOUNTER — Encounter (HOSPITAL_BASED_OUTPATIENT_CLINIC_OR_DEPARTMENT_OTHER): Payer: Self-pay | Admitting: Family Medicine

## 2023-05-30 ENCOUNTER — Other Ambulatory Visit: Payer: Self-pay | Admitting: Family Medicine

## 2023-05-30 DIAGNOSIS — K5900 Constipation, unspecified: Secondary | ICD-10-CM

## 2023-06-08 ENCOUNTER — Encounter: Payer: Self-pay | Admitting: Obstetrics and Gynecology

## 2023-06-08 ENCOUNTER — Ambulatory Visit (INDEPENDENT_AMBULATORY_CARE_PROVIDER_SITE_OTHER): Payer: Medicare Other | Admitting: Obstetrics and Gynecology

## 2023-06-08 VITALS — BP 150/84 | HR 76 | Ht 62.25 in | Wt 163.0 lb

## 2023-06-08 DIAGNOSIS — T83718A Erosion of other implanted mesh and other prosthetic materials to surrounding organ or tissue, initial encounter: Secondary | ICD-10-CM

## 2023-06-08 DIAGNOSIS — N393 Stress incontinence (female) (male): Secondary | ICD-10-CM | POA: Diagnosis not present

## 2023-06-08 DIAGNOSIS — N3281 Overactive bladder: Secondary | ICD-10-CM

## 2023-06-08 DIAGNOSIS — R350 Frequency of micturition: Secondary | ICD-10-CM | POA: Diagnosis not present

## 2023-06-08 DIAGNOSIS — L900 Lichen sclerosus et atrophicus: Secondary | ICD-10-CM | POA: Diagnosis not present

## 2023-06-08 DIAGNOSIS — R159 Full incontinence of feces: Secondary | ICD-10-CM

## 2023-06-08 LAB — POCT URINALYSIS DIPSTICK
Bilirubin, UA: NEGATIVE
Blood, UA: NEGATIVE
Glucose, UA: NEGATIVE
Ketones, UA: NEGATIVE
Leukocytes, UA: NEGATIVE
Nitrite, UA: NEGATIVE
Protein, UA: NEGATIVE
Spec Grav, UA: 1.02 (ref 1.010–1.025)
Urobilinogen, UA: 0.2 U/dL
pH, UA: 5.5 (ref 5.0–8.0)

## 2023-06-08 MED ORDER — CLOBETASOL PROPIONATE E 0.05 % EX CREA: 0.5000 g | TOPICAL_CREAM | CUTANEOUS | 11 refills | Status: DC

## 2023-06-08 NOTE — Patient Instructions (Addendum)
 Accidental Bowel Leakage: Our goal is to achieve formed bowel movements daily or every-other-day without leakage.  You may need to try different combinations of the following options to find what works best for you.  Some management options include: Dietary changes (more leafy greens, vegetables and fruits; less processed foods) Fiber supplementation (Metamucil or something with psyllium as active ingredient)- start taking daily Over-the-counter imodium (tablets or liquid) to help solidify the stool and prevent leakage of stool.  Use vaginal estrogen cream under the urethra two times a week. This will help with vaginal healing.   Use clobetasol in the vulva area 2-3 times a week. 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 A prescription was sent to the pharmacy.   URODYNAMICS (UDS) TEST INFORMATION  IMPORTANT: Please try to arrive with a comfortably full bladder!    What is UDS? Urodynamics is a bladder test used to evaluate how your bladder and urethra (tube you urinate out of) work to help find out the cause of your bladder symptoms and evaluate your bladder function in order to make the best treatment plan for you.   What to expect? A nurse will perform the test and will be with you during the entire exam. First we will have to empty your bladder on a special toilet.  After you have emptied your bladder, very small catheters (plastic tubing) will be placed into your bladder and into your vagina (or rectum). These special small catheters measure pressure to help measure your bladder function.  Your bladder will be gently filled with water and you will be asked to cough and strain at several different points during the test.   You will then be asked to empty your bladder in the special toilet with the catheters in place. Most patients can urinate (pee) easily with the catheters in place since the catheters  are so small. In total this procedure lasts about 45 minutes to 1 hour.  After your test is completed, you will return (or possibly be seen the same day) to review the results, talk about treatment options and make a plan moving forward.

## 2023-06-08 NOTE — Assessment & Plan Note (Signed)
-   Discussed that we have the option of monitoring the mesh exposure, as it has been present for many years vs excision of the exposed area in the operating room.  - Usually I would like to do a cystoscopy to ensure no erosion into the urethra or bladder but pt believes she had cysto with botox in the last 6 months and has been getting it for many years. So will request records from [facility] to see if they mentioned any known issues with the sling. Then can decide if repeat cysto is necessary.  - Advised to use vaginal estrogen cream internally. She had this previously prescribed. Can use 0.5g twice a week under the urethra.

## 2023-06-08 NOTE — Assessment & Plan Note (Addendum)
-   Advised to place clobetasol over affected vulvar areas 2-3 times a week

## 2023-06-08 NOTE — Progress Notes (Signed)
 New Patient Evaluation and Consultation  Referring Provider: Jerene Bears, MD PCP: de Peru, Buren Kos, MD Date of Service: 06/08/2023  SUBJECTIVE Chief Complaint: New Patient (Initial Visit) Beth [name] is a 73 y.o. female here for a consult for incontinence. Pt said she has previous surgery and needs to be evaluated for bladder mesh exposure.)  History of Present Illness: Beth [name] is a 74 y.o. White or Caucasian female seen in consultation at the request of Dr Hyacinth Meeker for evaluation of mesh exposure.    Review of records significant for: History of sling placement. Noted exposure in midline on exam (first noted in 2019).   Review of surgical operative notes performed by Dr Lissa Hoard:  03/2006- planned for sling, small meatal laceration so sling was deferred 08/2006- attempted fascial sling placement, unable to do so due to obesity 01/2007- placement of SPARC sling  Urinary Symptoms: Leaks urine with cough/ sneeze, lifting, going from sitting to standing, with movement to the bathroom, and with urgency Leaks "continuously" - less often with Stress. Urgency is more bothersome, especially because she has poor mobility with a walker.  Pad use: 2-3 adult diapers per day.   Patient is bothered by UI symptoms.  She has been getting intravesical botox with Dr Lissa Hoard up until about 6 months ago. She has been getting injections for several years. Sometimes she has seen great improvement, other times, hardly any effect.  She also did some pelvic physical therapy in the past.  Vaginal mesh exposure noted by Dr Edward Jolly in 2019 and Dr Hyacinth Meeker this year- pt prescribed vaginal estrogen but only used it once externally and it caused some burning so has not been using again.   Day time voids 5-6.  Nocturia: 2 times per night to void. Voiding dysfunction:  empties bladder well.  Patient does not use a catheter to empty bladder.  When urinating, patient feels a weak stream, difficulty  starting urine stream, and dribbling after finishing Drinks: 1 cup coffee in AM, water. Rare iced tea  UTIs: 1 UTI's in the last year.  She was having some lower abdominal pain and was dx in urgent care.  Denies history of blood in urine and kidney or bladder stones Susceptibility data from last 90 days. Collected Specimen Info Organism AMPICILLIN AMPICILLIN/SULBACTAM CEFAZOLIN CEFEPIME CEFTRIAXONE Ciprofloxacin Gentamicin Susc lslt Imipenem Nitrofurantoin Susc lslt Piperacillin + Tazobactam Trimethoprim/Sulfa  05/07/23 Urine, Clean Catch Escherichia coli  R  I  S  S  S  S  S  S  S  S  S    Pelvic Organ Prolapse Symptoms:                  Patient Denies a feeling of a bulge the vaginal area.   Bowel Symptom: Bowel movements: 3-4 time(s) per day Stool consistency: soft  Straining: no.  Splinting: no.  Incomplete evacuation: no.  Patient Admits to accidental bowel leakage / fecal incontinence  Occurs: daily   Consistency with leakage: soft  Bowel regimen:  linzess for IBS  HM Colonoscopy          Upcoming     Colonoscopy (Every 5 Years) Next due on 06/28/2026    06/27/2021  Surgical Procedure: COLONOSCOPY WITH PROPOFOL   Only the first 1 history entries have been loaded, but more history exists.               Sexual Function Sexually active: no.   Pelvic Pain Admits to pelvic pain Location: right  gorin Pain occurs: variable Prior pain treatment: none   She reports that she has lost about 100 lbs over the last few years. Last Hgb A1c was 5.5% in Oct 2024  Past Medical History:  Past Medical History:  Diagnosis Date   Abscessed tooth    Alzheimer dementia (HCC)    Anemia    Anxiety    Arthritis    Bell's palsy 01/1985   COPD (chronic obstructive pulmonary disease) (HCC)    Depression    Diabetes mellitus without complication (HCC)    Femur fracture, left (HCC)    Fibroid uterus 12/20/2014   7cm, 3.4cm and 1.9cm fibroids    Gallstones    GERD  (gastroesophageal reflux disease)    Hyperlipidemia    Hypertension    IBS (irritable bowel syndrome) 02/1988   Obesity hypoventilation syndrome (HCC) 11/19/2014   Obesity, Class III, BMI 40-49.9 (morbid obesity) (HCC)    OSA (obstructive sleep apnea)    no cpap   Osteopenia    Rotator cuff tear 2023   with bicep tear   Sleep apnea    resolved 25 years ago   Vitamin D deficiency      Past Surgical History:   Past Surgical History:  Procedure Laterality Date   BLADDER SUSPENSION  03/2006, 07/2006, 01/2007   for stress incontinence   CHOLECYSTECTOMY N/A 12/08/2018   Procedure: LAPAROSCOPIC CHOLECYSTECTOMY;  Surgeon: Abigail Miyamoto, MD;  Location: [facility] OR;  Service: General;  Laterality: N/A;   COLONOSCOPY  05/2005   Normal, recheck 10 yrs   COLONOSCOPY WITH PROPOFOL N/A 06/27/2021   Procedure: COLONOSCOPY WITH PROPOFOL;  Surgeon: Jeani Hawking, MD;  Location: [facility];  Service: Endoscopy;  Laterality: N/A;   HEMOSTASIS CLIP PLACEMENT  06/27/2021   Procedure: HEMOSTASIS CLIP PLACEMENT;  Surgeon: Jeani Hawking, MD;  Location: [facility];  Service: Endoscopy;;   JOINT REPLACEMENT  2012.2014.2020   right knee   LUMBAR FUSION Right 09/2016   L4-5 fusion by Dr. Marikay Alar   POLYPECTOMY  06/27/2021   Procedure: POLYPECTOMY;  Surgeon: Jeani Hawking, MD;  Location: [facility];  Service: Endoscopy;;   SPINE SURGERY  2018   TONSILLECTOMY AND ADENOIDECTOMY     TOTAL KNEE ARTHROPLASTY Left 2012   x 2 left knee   TOTAL KNEE REVISION  2014   TOTAL KNEE REVISION Left 08/2018   Dr Brandy Hale, [facility]     Past OB/GYN History: OB History  Gravida Para Term Preterm AB Living  3 1 1  2 1   SAB IAB Ectopic Multiple Live Births   2   1    # Outcome Date GA Lbr Len/2nd Weight Sex Type Anes PTL Lv  3 Term 1972   7 lb 11 oz (3.487 kg) F Vag-Spont        Birth Comments: preclamsia  2 IAB           1 IAB             Menopausal: Denies vaginal bleeding since menopause      Component Value Date/Time   DIAGPAP  01/05/2022 1039    - Negative for intraepithelial lesion or malignancy (NILM)   DIAGPAP  06/26/2019 1119    - Benign reactive/reparative changes including parakeratosis   DIAGPAP  06/26/2019 1119    - Negative for Intraepithelial Lesions or Malignancy (NILM)   ADEQPAP  01/05/2022 1039    Satisfactory for evaluation. The presence or absence of an   ADEQPAP  01/05/2022 1039  endocervical/transformation zone component cannot be determined because   ADEQPAP of atrophy. 01/05/2022 1039    Medications: Patient has a current medication list which includes the following prescription(s): alprazolam, b complex-biotin-fa, biotin, vitamin d, [START ON 06/09/2023] clobetasol propionate e, b-12, estradiol, linzess, magnesium, milk thistle, omega-3 fatty acids, rosuvastatin, turmeric, venlafaxine xr, ascorbic acid, vitamin e, and zinc gluconate.   Allergies: Patient is allergic to penicillins, sulfa antibiotics, hydrocodone, celebrex [celecoxib], ciprofloxacin, codeine, levaquin [levofloxacin], and motrin ib [ibuprofen].   Social History:  Social History   Tobacco Use   Smoking status: Every Day    Current packs/day: 0.75    Average packs/day: 0.8 packs/day for 52.2 years (39.2 ttl pk-yrs)    Types: Cigarettes    Start date: 03/10/1971    Passive exposure: Current   Smokeless tobacco: Never   Tobacco comments:    1 pack/day from age [age], now down to 0.5  Vaping Use   Vaping status: Never Used  Substance Use Topics   Alcohol use: No   Drug use: No    Relationship status: divorced Patient lives alone.   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 Regular exercise: No History of abuse: No  Family History:   Family History  Problem Relation Age of Onset   Dementia Mother    Arthritis Mother    Depression Mother    Hypertension Father    Diabetes Father    Parkinson's disease Father    Obesity Father    Stroke Father    Heart attack Paternal  Grandfather 69       Died MI   Colon cancer Neg Hx    Esophageal cancer Neg Hx    Pancreatic cancer Neg Hx    Stomach cancer Neg Hx      Review of Systems: Review of Systems  Constitutional:  Positive for malaise/fatigue. Negative for fever and weight loss.  Respiratory:  Negative for cough, shortness of breath and wheezing.   Cardiovascular:  Negative for chest pain, palpitations and leg swelling.  Gastrointestinal:  Negative for abdominal pain and blood in stool.  Genitourinary:  Negative for dysuria.  Musculoskeletal:  Positive for myalgias.  Skin:  Negative for rash.  Neurological:  Negative for dizziness and headaches.  Endo/Heme/Allergies:  Does not bruise/bleed easily.  Psychiatric/Behavioral:  Positive for depression. The patient is nervous/anxious.      OBJECTIVE Physical Exam: Vitals:   06/08/23 0952  BP: (!) 150/84  Pulse: 76  Weight: 163 lb (73.9 kg)  Height: 5' 2.25" (1.581 m)    Physical Exam Vitals reviewed. Exam conducted with a chaperone present.  Constitutional:      General: She is not in acute distress. Pulmonary:     Effort: Pulmonary effort is normal.  Abdominal:     General: There is no distension.     Palpations: Abdomen is soft.     Tenderness: There is no abdominal tenderness. There is no rebound.  Musculoskeletal:        General: No swelling. Normal range of motion.  Skin:    General: Skin is warm and dry.     Findings: No rash.  Neurological:     Mental Status: She is alert and oriented to person, place, and time.  Psychiatric:        Mood and Affect: Mood normal.        Behavior: Behavior normal.     GU / Detailed Urogynecologic Evaluation:  Pelvic Exam: Lichen sclerosus present over clitoral hood, in the creases below  the labia majora and surrounding the anus.  Bartholin's and Skene's glands normal in appearance; urethral meatus normal in appearance, no urethral masses or discharge.   CST: positive, small leak  present  Speculum exam reveals normal vaginal mucosa with atrophy. Two small areas of mesh erosion in midline distal urethra (each approx 0.5cm). Cervix normal appearance. Uterus normal single, nontender. Adnexa no mass, fullness, tenderness.    Pelvic floor strength II/V, puborectalis III/V external anal sphincter IV/V  Pelvic floor musculature: Right levator tender, Right obturator non-tender, Left levator non-tender, Left obturator non-tender  POP-Q:   POP-Q  -3                                            Aa   -3                                           Ba  -8                                              C   2                                            Gh  2.5                                            Pb  8                                            tvl   -3                                            Ap  -3                                            Bp  -8                                              D      Rectal Exam:  Dovetail sign present, thin perineal body. Normal sphincter tone, no distal rectocele, enterocoele not present, no rectal masses, no sign of dyssynergia when asking the patient to bear down.  Post-Void Residual (PVR) by catheter  Urethra was prepped with betadine and straight catheter placed.    Post Void Residual - 06/08/23 1043       Post Void Residual   Post Void Residual 175 mL  cathed             Laboratory Results: Lab Results  Component Value Date   COLORU Yellow 06/08/2023   CLARITYU Clear 06/08/2023   GLUCOSEUR Negative 06/08/2023   BILIRUBINUR Negative 06/08/2023   KETONESU Negative 06/08/2023   SPECGRAV 1.020 06/08/2023   RBCUR Negative 06/08/2023   PHUR 5.5 06/08/2023   PROTEINUR Negative 06/08/2023   UROBILINOGEN 0.2 06/08/2023   LEUKOCYTESUR Negative 06/08/2023    Lab Results  Component Value Date   CREATININE 0.77 05/09/2023   CREATININE 0.75 05/07/2023   CREATININE 0.72 12/24/2022    Lab Results   Component Value Date   HGBA1C 5.5 12/24/2022    Lab Results  Component Value Date   HGB 14.8 05/09/2023     ASSESSMENT AND PLAN Beth [name] is a 73 y.o. with:  1. Erosion of bladder suspension mesh, initial encounter (HCC)   2. SUI (stress urinary incontinence, female)   3. Overactive bladder   4. Lichen sclerosus   5. Urinary frequency   6. Incontinence of feces, unspecified fecal incontinence type     Erosion of bladder suspension mesh, initial encounter [facility]) Assessment & Plan: - Discussed that we have the option of monitoring the mesh exposure, as it has been present for many years vs excision of the exposed area in the operating room.  - Usually I would like to do a cystoscopy to ensure no erosion into the urethra or bladder but pt believes she had cysto with botox in the last 6 months and has been getting it for many years. So will request records from [facility] to see if they mentioned any known issues with the sling. Then can decide if repeat cysto is necessary.  - Advised to use vaginal estrogen cream internally. She had this previously prescribed. Can use 0.5g twice a week under the urethra.    SUI (stress urinary incontinence, female) Assessment & Plan: - For SUI symptoms, would not place another sling due to exposure but can consider urethral bulking.  -  We reviewed that this is not a permanent procedure and the Bulkamid does dissolve over time. Risks reviewed including injury to bladder or urethra, UTI, urinary retention and hematuria.  - Will get urodynamic testing to confirm SUI and assess bladder emptying   Overactive bladder Assessment & Plan: - Reports she has been getting botox injections at [facility]. Will obtain records to confirm timing (she believes in the last 6 months).  - Unclear if she has incomplete bladder emptying as PVR was 175 today (unable to void). Will assess further with urodynamic testing.  - We also briefly discussed options  of SNM and PTNS as an alternative to botox for OAB symptoms.    Lichen sclerosus Assessment & Plan: - Advised to place clobetasol over affected vulvar areas 2-3 times a week  Orders: -     Clobetasol Propionate E; Apply 1 Application topically 3 (three) times a week.  Dispense: 30 g; Refill: 11  Urinary frequency -     POCT urinalysis dipstick  Incontinence of feces, unspecified fecal incontinence type Assessment & Plan: - Treatment options include anti-diarrhea medication (loperamide/ Imodium OTC or prescription lomotil), fiber supplements, physical therapy, and possible sacral neuromodulation or surgery.   - Recommended starting metamucil for stool bulking. She is using linzess every other day to prevent constipation    Return for urodynamic testing   Marguerita Beards, MD

## 2023-06-08 NOTE — Assessment & Plan Note (Signed)
-   For SUI symptoms, would not place another sling due to exposure but can consider urethral bulking.  -  We reviewed that this is not a permanent procedure and the Bulkamid does dissolve over time. Risks reviewed including injury to bladder or urethra, UTI, urinary retention and hematuria.  - Will get urodynamic testing to confirm SUI and assess bladder emptying

## 2023-06-08 NOTE — Assessment & Plan Note (Addendum)
-   Treatment options include anti-diarrhea medication (loperamide/ Imodium OTC or prescription lomotil), fiber supplements, physical therapy, and possible sacral neuromodulation or surgery.   - Recommended starting metamucil for stool bulking. She is using linzess every other day to prevent constipation

## 2023-06-08 NOTE — Assessment & Plan Note (Signed)
-   Reports she has been getting botox injections at [facility]. Will obtain records to confirm timing (she believes in the last 6 months).  - Unclear if she has incomplete bladder emptying as PVR was 175 today (unable to void). Will assess further with urodynamic testing.  - We also briefly discussed options of SNM and PTNS as an alternative to botox for OAB symptoms.

## 2023-06-21 NOTE — Progress Notes (Signed)
 Will check at 7 month follow up

## 2023-06-21 NOTE — Progress Notes (Signed)
 [facility] Quality Team Note  Name: Beth [name] Date of Birth: 17-Aug-1950 MRN: [id number] Date: 06/21/2023  [facility] Quality Team has reviewed this patient's chart, please see recommendations below:  [facility] Quality Other; (PLEASE SCHEDULE PATIENT FOR KED LABS- URINE MICROALBUMIN/CREATININE TEST AS WELL AS A1C CHECK FOR GAP CLOSURE.)

## 2023-06-22 ENCOUNTER — Encounter: Payer: Self-pay | Admitting: Obstetrics and Gynecology

## 2023-06-22 ENCOUNTER — Ambulatory Visit (INDEPENDENT_AMBULATORY_CARE_PROVIDER_SITE_OTHER): Admitting: Obstetrics and Gynecology

## 2023-06-22 VITALS — BP 147/89 | HR 80

## 2023-06-22 DIAGNOSIS — R339 Retention of urine, unspecified: Secondary | ICD-10-CM

## 2023-06-22 DIAGNOSIS — R350 Frequency of micturition: Secondary | ICD-10-CM

## 2023-06-22 DIAGNOSIS — B379 Candidiasis, unspecified: Secondary | ICD-10-CM

## 2023-06-22 DIAGNOSIS — N393 Stress incontinence (female) (male): Secondary | ICD-10-CM

## 2023-06-22 DIAGNOSIS — N3281 Overactive bladder: Secondary | ICD-10-CM | POA: Diagnosis not present

## 2023-06-22 LAB — POCT URINALYSIS DIPSTICK
Bilirubin, UA: NEGATIVE
Blood, UA: NEGATIVE
Glucose, UA: NEGATIVE
Ketones, UA: NEGATIVE
Leukocytes, UA: NEGATIVE
Nitrite, UA: NEGATIVE
Protein, UA: NEGATIVE
Spec Grav, UA: 1.01 (ref 1.010–1.025)
Urobilinogen, UA: 0.2 U/dL
pH, UA: 6 (ref 5.0–8.0)

## 2023-06-22 MED ORDER — NYSTATIN 100000 UNIT/GM EX POWD
1.0000 | Freq: Three times a day (TID) | CUTANEOUS | 0 refills | Status: DC
Start: 2023-06-22 — End: 2023-07-01

## 2023-06-22 NOTE — Progress Notes (Signed)
 [facility] Urodynamics Procedure  Referring Physician: de Peru, Raymond J, MD Date of Procedure: 06/22/2023  Beth [name] is a 73 y.o. female who presents for urodynamic evaluation. Indication(s) for study: mixed incontinence  Vital Signs: LMP 04/09/2009   Laboratory Results: A catheterized urine specimen revealed:  POC urine:  Lab Results  Component Value Date   COLORU Yellow 06/22/2023   CLARITYU Clear 06/22/2023   GLUCOSEUR Negative 06/22/2023   BILIRUBINUR Negative 06/22/2023   KETONESU Negative 06/22/2023   SPECGRAV 1.010 06/22/2023   RBCUR Negative 06/22/2023   PHUR 6.0 06/22/2023   PROTEINUR Negative 06/22/2023   UROBILINOGEN 0.2 06/22/2023   LEUKOCYTESUR Negative 06/22/2023     Voiding Diary: Deferred   Procedure Timeout:  The correct patient was verified and the correct procedure was verified. The patient was in the correct position and safety precautions were reviewed based on at the patient's history.  Urodynamic Procedure A 1063F dual lumen urodynamics catheter was placed under sterile conditions into the patient's bladder. A 1063F catheter was placed into the rectum in order to measure abdominal pressure. EMG patches were placed in the appropriate position.  All connections were confirmed and calibrations/adjusted made. Saline was instilled into the bladder through the dual lumen catheters.  Cough/valsalva pressures were measured periodically during filling.  Patient was allowed to void.  The bladder was then emptied of its residual.  UROFLOW: Revealed a Qmax of 34.1 mL/sec.  She voided 291 mL and had a residual of 50 mL.  It was a normal pattern and represented normal habits.   CMG: This was performed with sterile water in the sitting position at a fill rate of 30 mL/min.    First sensation of fullness was 76 mLs,  First urge was 232 mLs,  Strong urge was 256 mLs and  Capacity was 546 mLs  Stress incontinence was demonstrated Highest  positive CLPP was 134 cmH20 at 200 ml. Highest positive VLPP was 109 cmH20at 200 ml.   Detrusor function was overactive, with phasic contractions seen.  The first occurred at 71 mL to 10.3 cm of water and was associated with urge.  Compliance:  Low. End fill detrusor pressure was 149cmH20.  Calculated compliance was 52mL/cmH20  UPP: MUCP without barrier reduction was 67 cm of water.    MICTURITION STUDY: Voiding was performed without reduction in the sitting position.  Pdet at Qmax was 180 cm of water.  Qmax was 7 mL/sec.  It was a interrupted pattern.  She voided 146 mL and had a residual of 400 mL.  It was a volitional void, sustained detrusor contraction was present and abdominal straining was present but was not shown on abdominal pressure reading due to position. Her PVES and PURA show significant tracings that is consistent with abdominal straining.   EMG: This was performed with patches.  She had voluntary contractions, recruitment with fill was present and urethral sphincter was not relaxed with void.  The details of the procedure with the study tracings have been scanned into EPIC.   Urodynamic Impression:  1. Sensation was normal; capacity was normal 2. Stress Incontinence was demonstrated at normal pressures; 3. Detrusor Overactivity was demonstrated with leakage. 4. Emptying was dysfunctional with a elevated PVR ( ), a sustained detrusor contraction present,  abdominal straining present, dyssynergic urethral sphincter activity on EMG.  Plan: - The patient will follow up  to discuss the findings and treatment options.

## 2023-06-22 NOTE — Patient Instructions (Signed)
 Taking Care of Yourself after Urodynamics  Drink plenty of water for a day or two following your procedure. Try to have about 8 ounces (one cup) at a time, and do this 6 times or more per day unless you have fluid restrictitons AVOID irritative beverages such as coffee, tea, soda, alcoholic or citrus drinks for a day or two, as this may cause burning with urination. You may experience some discomfort or a burning sensation with urination after having this procedure. You can use over the counter Azo or pyridium to help with burning and follow the instructions on the packaging. If it does not improve within 1-2 days, or other symptoms appear (fever, chills, or difficulty urinating) call the office to speak to a nurse.  You may return to normal daily activities such as work, school, driving, exercising and housework on the day of the procedure.   For the Nystatin powder please use it in the fold under the stomach and around the leg where the moisture is.

## 2023-06-25 ENCOUNTER — Ambulatory Visit: Payer: Medicare Other | Admitting: Internal Medicine

## 2023-06-27 ENCOUNTER — Other Ambulatory Visit: Payer: Self-pay | Admitting: Obstetrics and Gynecology

## 2023-06-27 DIAGNOSIS — B379 Candidiasis, unspecified: Secondary | ICD-10-CM

## 2023-06-29 ENCOUNTER — Encounter (HOSPITAL_BASED_OUTPATIENT_CLINIC_OR_DEPARTMENT_OTHER): Payer: Self-pay | Admitting: Obstetrics & Gynecology

## 2023-06-30 ENCOUNTER — Other Ambulatory Visit: Payer: Self-pay | Admitting: Obstetrics and Gynecology

## 2023-06-30 DIAGNOSIS — B379 Candidiasis, unspecified: Secondary | ICD-10-CM

## 2023-07-01 ENCOUNTER — Encounter: Payer: Self-pay | Admitting: Neurology

## 2023-07-01 ENCOUNTER — Ambulatory Visit: Admitting: Neurology

## 2023-07-01 VITALS — BP 124/68 | Ht 63.0 in | Wt 166.0 lb

## 2023-07-01 DIAGNOSIS — G3184 Mild cognitive impairment, so stated: Secondary | ICD-10-CM | POA: Diagnosis not present

## 2023-07-01 DIAGNOSIS — G309 Alzheimer's disease, unspecified: Secondary | ICD-10-CM

## 2023-07-01 NOTE — Progress Notes (Signed)
 [facility]  PATIENT: Beth [name] DOB: 08-Jan-1951  REQUESTING CLINICIAN: de Peru, Raymond J, MD HISTORY FROM: Patient  REASON FOR VISIT: Memory loss    HISTORICAL  CHIEF COMPLAINT:  Chief Complaint  Patient presents with   Follow-up    Rm 13, with friend Josefina Nian   INTERVAL HISTORY 07/01/2023 Patient presents today for follow-up, she is accompanied by her friend Josefina Nian. Last visit was in October. Since then, she has been tolerated the lecanemab  infusion well, denies any side effect from the medication, completed 11 infusions. Her surveillance MRI have been negative, she tells me that she is doing well.  She still live alone and she is planning to move to  [facility], an independent living facility, denies any worsening of her memory, denies any fall, no other questions, or concerns.    INTERVAL HISTORY 12/15/2022 Patient presents today for follow-up, last visit was in May, at that time we obtained a ATN profile which was positive for Alzheimer disease biomarker, she did have a amyloid PET scan which was positive, showed moderate to frequent amyloid plaque.  She is interested in the new medication such as Leqembi  and wanted to get more information.   HISTORY OF PRESENT ILLNESS:  This is a 73 year old woman with past medical history of anxiety, depression, hyperlipidemia who is presenting per her daughter request for memory problem.  Patient feels like her memory is fine, she is forgetful but independent in all activities of daily living, she has trouble with names but otherwise she is fine.  She reports that her daughter has complained about her memory, she said that patient asked the same questions, about the same thing and at one point she even thought her mother needed to be moved to a nursing home which made patient very upset.  Patient reports that she lives alone, she cooks very little and use a lot of microwave food.  She does some cleaning, has a housekeeper and  also does online shopping.  She does not have any issue with self-care.  She manages her bills online.  She drives, denies any accident in any denies being lost in familiar places.   TBI:   No past history of TBI Stroke:   no past history of stroke Seizures:   no past history of seizures Sleep:  Previous history but improved after weight loss. Mood: Yes, both anxiety and depression Family history of Dementia: Both parents with dementia but at a later age (in their 69)  Functional status: independent in all ADLs and IADLs Patient lives alone. Cooking: very little cooking, microwave Cleaning: some, has a Water quality scientist: yes, online shopping  Bathing: no issues  Toileting: no issues  Driving: yes, no recent accident Bills: Online payment   Ever left the stove on by accident?: Denies  Forget how to use items around the house?: Denies  Getting lost going to familiar places?: Denies  Forgetting loved ones names?: Denies  Word finding difficulty? Yes Sleep: Good    OTHER MEDICAL CONDITIONS: Anxiety/Depression, hyperlipidemia,   REVIEW OF SYSTEMS: Full 14 system review of systems performed and negative with exception of: As noted in the HPI   ALLERGIES: Allergies  Allergen Reactions   Penicillins Anaphylaxis and Rash        Sulfa Antibiotics Shortness Of Breath and Rash    Also light headed   Hydrocodone  Nausea And Vomiting   Celebrex [Celecoxib] Other (See Comments)    Elevates Blood Pressure    Ciprofloxacin Nausea And  Vomiting   Codeine Nausea And Vomiting   Estradiol  Rash   Levaquin [Levofloxacin] Nausea And Vomiting   Motrin Ib [Ibuprofen] Hives    HOME MEDICATIONS: Outpatient Medications Prior to Visit  Medication Sig Dispense Refill   ALPRAZolam  (XANAX ) 0.25 MG tablet Take 1/2 to 1 tablet 2 x / day if needed for anxiety. (Patient taking differently: Take 0.25 mg by mouth 2 (two) times daily.) 60 tablet 0   B Complex-Biotin-FA (B-100 COMPLEX PO) Take by  mouth.     BIOTIN PO Take 1 tablet by mouth daily.     Cholecalciferol (VITAMIN D ) 50 MCG (2000 UT) CAPS Take 2,000 Units by mouth daily.     Clobetasol  Prop Emollient Base (CLOBETASOL  PROPIONATE E) 0.05 % emollient cream Apply 1 Application topically 3 (three) times a week. 30 g 11   Cyanocobalamin (B-12) 100 MCG TABS Take by mouth.     LINZESS  72 MCG capsule TAKE 1 CAPSULE(72 MCG) BY MOUTH DAILY BEFORE BREAKFAST 30 capsule 2   Magnesium 500 MG TABS Take 500 mg by mouth daily.     milk thistle 175 MG tablet Take 175 mg by mouth daily.     nystatin  (MYCOSTATIN /NYSTOP ) powder Apply 1 Application topically 3 (three) times daily. 15 g 0   Omega-3 Fatty Acids (FISH OIL OMEGA-3 PO) Take 1 capsule by mouth daily.     rosuvastatin  (CRESTOR ) 10 MG tablet TAKE 1 TABLET BY MOUTH DAILY FOR CHOLESTEROL 90 tablet 3   TURMERIC PO Take by mouth.     venlafaxine  XR (EFFEXOR -XR) 150 MG 24 hr capsule Take 150 mg by mouth 2 (two) times daily.      vitamin C (ASCORBIC ACID) 500 MG tablet Take 500 mg by mouth daily.     vitamin E 180 MG (400 UNITS) capsule Take 400 Units by mouth daily.     zinc gluconate 50 MG tablet Take 50 mg by mouth daily.     estradiol  (ESTRACE ) 0.1 MG/GM vaginal cream Apply small amount every other day to area where the urethral sling is located. (Patient not taking: Reported on 07/01/2023) 42.5 g 1   No facility-administered medications prior to visit.    PAST MEDICAL HISTORY: Past Medical History:  Diagnosis Date   Abscessed tooth    Alzheimer dementia (HCC)    Anemia    Anxiety    Arthritis    Bell's palsy 01/1985   COPD (chronic obstructive pulmonary disease) (HCC)    Depression    Diabetes mellitus without complication (HCC)    Femur fracture, left (HCC)    Fibroid uterus 12/20/2014   7cm, 3.4cm and 1.9cm fibroids    Gallstones    GERD (gastroesophageal reflux disease)    Hyperlipidemia    Hypertension    IBS (irritable bowel syndrome) 02/1988   Obesity hypoventilation  syndrome (HCC) 11/19/2014   Obesity, Class III, BMI 40-49.9 (morbid obesity) (HCC)    OSA (obstructive sleep apnea)    no cpap   Osteopenia    Rotator cuff tear 2023   with bicep tear   Sleep apnea    resolved 25 years ago   Vitamin D  deficiency     PAST SURGICAL HISTORY: Past Surgical History:  Procedure Laterality Date   BLADDER SUSPENSION  03/2006, 07/2006, 01/2007   for stress incontinence   CHOLECYSTECTOMY N/A 12/08/2018   Procedure: LAPAROSCOPIC CHOLECYSTECTOMY;  Surgeon: Oza Blumenthal, MD;  Location: [facility] OR;  Service: General;  Laterality: N/A;   COLONOSCOPY  05/2005   Normal, recheck  10 yrs   COLONOSCOPY WITH PROPOFOL  N/A 06/27/2021   Procedure: COLONOSCOPY WITH PROPOFOL ;  Surgeon: Alvis Jourdain, MD;  Location: [facility]COPY;  Service: Endoscopy;  Laterality: N/A;   HEMOSTASIS CLIP PLACEMENT  06/27/2021   Procedure: HEMOSTASIS CLIP PLACEMENT;  Surgeon: Alvis Jourdain, MD;  Location: [facility];  Service: Endoscopy;;   JOINT REPLACEMENT  2012.2014.2020   right knee   LUMBAR FUSION Right 09/2016   L4-5 fusion by Dr. Waymond Hailey   POLYPECTOMY  06/27/2021   Procedure: POLYPECTOMY;  Surgeon: Alvis Jourdain, MD;  Location: [facility];  Service: Endoscopy;;   SPINE SURGERY  2018   TONSILLECTOMY AND ADENOIDECTOMY     TOTAL KNEE ARTHROPLASTY Left 2012   x 2 left knee   TOTAL KNEE REVISION  2014   TOTAL KNEE REVISION Left 08/2018   Dr King Penning, [facility]    FAMILY HISTORY: Family History  Problem Relation Age of Onset   Dementia Mother    Arthritis Mother    Depression Mother    Hypertension Father    Diabetes Father    Parkinson's disease Father    Obesity Father    Stroke Father    Heart attack Paternal Grandfather 27       Died MI   Colon cancer Neg Hx    Esophageal cancer Neg Hx    Pancreatic cancer Neg Hx    Stomach cancer Neg Hx     SOCIAL HISTORY: Social History   Socioeconomic History   Marital status: Divorced    Spouse name: Not on file    Number of children: 1   Years of education: Not on file   Highest education level: Bachelor's degree (e.g., BA, AB, BS)  Occupational History    Employer: RETIRED  Tobacco Use   Smoking status: Every Day    Current packs/day: 0.75    Average packs/day: 0.8 packs/day for 52.3 years (39.2 ttl pk-yrs)    Types: Cigarettes    Start date: 03/10/1971    Passive exposure: Current   Smokeless tobacco: Never   Tobacco comments:    1 pack/day from age [age], now down to 0.5  Vaping Use   Vaping status: Never Used  Substance and Sexual Activity   Alcohol use: No   Drug use: No   Sexual activity: Not Currently    Partners: Male    Birth control/protection: Post-menopausal  Other Topics Concern   Not on file  Social History Narrative   Lives alone.     Right handed   Caffeine 1 cup daily    Retired Engineer, civil (consulting)   Social Drivers of Corporate investment banker Strain: Low Risk  (05/16/2023)   Overall Financial Resource Strain (CARDIA)    Difficulty of Paying Living Expenses: Not hard at all  Food Insecurity: No Food Insecurity (05/16/2023)   Hunger Vital Sign    Worried About Running Out of Food in the Last Year: Never true    Ran Out of Food in the Last Year: Never true  Transportation Needs: No Transportation Needs (05/16/2023)   PRAPARE - Administrator, Civil Service (Medical): No    Lack of Transportation (Non-Medical): No  Physical Activity: Insufficiently Active (05/16/2023)   Exercise Vital Sign    Days of Exercise per Week: 3 days    Minutes of Exercise per Session: 40 min  Stress: No Stress Concern Present (05/16/2023)   [employer] of Occupational Health - Occupational Stress Questionnaire    Feeling of Stress :  Only a little  Social Connections: Moderately Isolated (05/16/2023)   Social Connection and Isolation Panel [NHANES]    Frequency of Communication with Friends and Family: More than three times a week    Frequency of Social Gatherings with Friends and Family:  Once a week    Attends Religious Services: Never    Database administrator or Organizations: Yes    Attends Banker Meetings: 1 to 4 times per year    Marital Status: Divorced  Intimate Partner Violence: Not At Risk (05/20/2023)   Humiliation, Afraid, Rape, and Kick questionnaire    Fear of Current or Ex-Partner: No    Emotionally Abused: No    Physically Abused: No    Sexually Abused: No    PHYSICAL EXAM  GENERAL EXAM/CONSTITUTIONAL: Vitals:  Vitals:   07/01/23 1345  BP: 124/68  Weight: 166 lb (75.3 kg)  Height: 5\' 3"  (1.6 m)     Body mass index is 29.41 kg/m. Wt Readings from Last 3 Encounters:  07/01/23 166 lb (75.3 kg)  06/08/23 163 lb (73.9 kg)  05/20/23 159 lb 12.8 oz (72.5 kg)   Patient is in no distress; well developed, nourished and groomed; neck is supple  MUSCULOSKELETAL: Gait, strength, tone, movements noted in Neurologic exam below  NEUROLOGIC: MENTAL STATUS:     09/03/2022   12:11 PM  MMSE - Mini Mental State Exam  Orientation to time 5  Orientation to Place 5  Registration 3  Attention/ Calculation 5  Recall 1  Language- name 2 objects 2  Language- repeat 1  Language- follow 3 step command 3  Language- read & follow direction 1  Write a sentence 1  Copy design 1  Total score 28      07/01/2023    1:51 PM 12/31/2022    2:59 PM 07/23/2022    1:00 PM 07/23/2022   12:56 PM  Montreal Cognitive Assessment   Visuospatial/ Executive (0/5) 5 5 5 5   Naming (0/3) 3 3 3 3   Attention: Read list of digits (0/2) 2 1 2 2   Attention: Read list of letters (0/1) 0 0 1 1  Attention: Serial 7 subtraction starting at 100 (0/3) 3 2 3    Language: Repeat phrase (0/2) 2 1 2    Language : Fluency (0/1) 0 0 1   Abstraction (0/2) 2 2 2    Delayed Recall (0/5) 0 3 1   Orientation (0/6) 6 6 6    Total 23 23 26    Adjusted Score (based on education)   26      CRANIAL NERVE:  2nd, 3rd, 4th, 6th- visual fields full to confrontation, extraocular muscles  intact, no nystagmus 5th - facial sensation symmetric 7th - facial strength symmetric 8th - hearing intact 9th - palate elevates symmetrically, uvula midline 11th - shoulder shrug symmetric 12th - tongue protrusion midline  MOTOR:  normal bulk and tone, full strength in the BUE, BLE  SENSORY:  normal and symmetric to light touch  COORDINATION:  finger-nose-finger, fine finger movements normal  GAIT/STATION:  Walk with a walker      DIAGNOSTIC DATA (LABS, IMAGING, TESTING) - I reviewed patient records, labs, notes, testing and imaging myself where available.  Lab Results  Component Value Date   WBC 6.5 05/09/2023   HGB 14.8 05/09/2023   HCT 45.3 05/09/2023   MCV 91.0 05/09/2023   PLT 177 05/09/2023      Component Value Date/Time   NA 136 05/09/2023 1143   K 3.8 05/09/2023  1143   CL 101 05/09/2023 1143   CO2 25 05/09/2023 1143   GLUCOSE 98 05/09/2023 1143   BUN 18 05/09/2023 1143   CREATININE 0.77 05/09/2023 1143   CREATININE 0.72 12/24/2022 1456   CALCIUM  9.1 05/09/2023 1143   PROT 7.1 05/09/2023 1143   ALBUMIN 3.7 05/09/2023 1143   AST 37 05/09/2023 1143   ALT 29 05/09/2023 1143   ALKPHOS 123 05/09/2023 1143   BILITOT 0.6 05/09/2023 1143   GFRNONAA >60 05/09/2023 1143   GFRNONAA 79 05/14/2020 1428   GFRAA 91 05/14/2020 1428   Lab Results  Component Value Date   CHOL 135 12/24/2022   HDL 55 12/24/2022   LDLCALC 67 12/24/2022   TRIG 47 12/24/2022   CHOLHDL 2.5 12/24/2022   Lab Results  Component Value Date   HGBA1C 5.5 12/24/2022   Lab Results  Component Value Date   VITAMINB12 322 07/05/2019   Lab Results  Component Value Date   TSH 3.70 12/24/2022   ATN Profile positive for Alzheimer disease biomarkers  MRI Brain 09/02/2022 - Mild periventricular and subcortical and pontine foci of chronic small vessel ischemic disease. - No acute findings.  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  Since last visit, she has started lecanemab treatment, completed 11 infusions.  Her surveillance MRI has been negative for ARIA-E and ARIA-H.  Currently she is doing well, no additional complaints or concerns.  Plan will be to continue with current treatment with Leqembi.  I will see her in 6 months for follow-up or sooner if worse.   1. Mild cognitive impairment due to Alzheimer's disease [facility])      Patient Instructions  Continue current treatment with Leqembi Call for additional complaint or concern Follow-up as scheduled in October.  No orders of the defined types were placed in this encounter.   No orders of the defined types were placed in this encounter.   Return in about 6 months (around 12/21/2023).   I have spent a total of 45 minutes dedicated to this patient today, preparing to see patient, performing a medically appropriate examination and evaluation, ordering tests and/or medications and procedures, and counseling and educating the patient/family/caregiver; independently interpreting result and communicating results to the family/patient/caregiver; and documenting clinical information in the electronic medical record.   Cassandra Cleveland, MD 07/01/2023, 2:25 PM  [facility] [address], [address] [place], [place] [address] [phone]

## 2023-07-01 NOTE — Patient Instructions (Signed)
 Continue current treatment with Leqembi Call for additional complaint or concern Follow-up as scheduled in October.

## 2023-07-05 ENCOUNTER — Encounter: Payer: Self-pay | Admitting: Neurology

## 2023-07-05 NOTE — Telephone Encounter (Signed)
 Yes, we will be able to complete the forms. Thanks

## 2023-07-12 ENCOUNTER — Other Ambulatory Visit: Payer: Self-pay | Admitting: Neurology

## 2023-07-12 MED ORDER — LEQEMBI 200 MG/2ML IV SOLN: 10.0000 mg/kg | INTRAVENOUS | Status: AC

## 2023-07-22 DIAGNOSIS — R319 Hematuria, unspecified: Secondary | ICD-10-CM | POA: Insufficient documentation

## 2023-07-22 NOTE — Assessment & Plan Note (Signed)
 Presently utilizing rosuvastatin , denies any issues with medication.  Can continue with statin therapy, intermittent monitoring of cholesterol panel in the future

## 2023-07-22 NOTE — Assessment & Plan Note (Signed)
 Reportedly diagnosed in 2008.  Recent A1c has been within normal range on review of records.  Most recent hemoglobin A1c was in October and was 5.5% at that time.  No current medications, primarily managing with lifestyle modifications.  Given prior A1c, reasonable to continue with lifestyle modifications, no medications to be started at this time

## 2023-07-22 NOTE — Assessment & Plan Note (Signed)
 Small degree of hematuria noted on prior urine testing.  Patient currently asymptomatic in regards to recent UTI and completion of antibiotics.  Discussed considerations today.  For follow-up of hematuria, it is somewhat early to retest this.  Recommend waiting until closer to 6 weeks to retest at home, however patient wishes to proceed with testing today.  Urine testing completed today, we will follow-up on results and advise on further recommendations once results reviewed

## 2023-07-22 NOTE — Assessment & Plan Note (Signed)
 Noted on imaging in the past.  Recommend lifestyle modifications, working towards regular physical activity, gradual weight loss.  We can continue with intermittent monitoring of labs

## 2023-07-22 NOTE — Assessment & Plan Note (Signed)
 Blood pressure borderline in office today, no changes made today.  Can continue with lifestyle modifications, recommend intermittent monitoring of blood pressure at home, DASH diet.

## 2023-07-28 ENCOUNTER — Other Ambulatory Visit: Payer: Self-pay | Admitting: Neurology

## 2023-07-28 DIAGNOSIS — G3184 Mild cognitive impairment, so stated: Secondary | ICD-10-CM

## 2023-07-30 ENCOUNTER — Encounter: Payer: Self-pay | Admitting: Obstetrics and Gynecology

## 2023-07-30 ENCOUNTER — Ambulatory Visit
Admission: RE | Admit: 2023-07-30 | Discharge: 2023-07-30 | Disposition: A | Discharge status: Home / Self Care | Source: Ambulatory Visit | Attending: Neurology | Admitting: Neurology

## 2023-07-30 ENCOUNTER — Ambulatory Visit (INDEPENDENT_AMBULATORY_CARE_PROVIDER_SITE_OTHER): Admitting: Obstetrics and Gynecology

## 2023-07-30 VITALS — BP 137/88 | HR 74

## 2023-07-30 DIAGNOSIS — T83718A Erosion of other implanted mesh and other prosthetic materials to surrounding organ or tissue, initial encounter: Secondary | ICD-10-CM

## 2023-07-30 DIAGNOSIS — G309 Alzheimer's disease, unspecified: Secondary | ICD-10-CM | POA: Diagnosis not present

## 2023-07-30 DIAGNOSIS — R159 Full incontinence of feces: Secondary | ICD-10-CM

## 2023-07-30 DIAGNOSIS — R339 Retention of urine, unspecified: Secondary | ICD-10-CM

## 2023-07-30 DIAGNOSIS — N393 Stress incontinence (female) (male): Secondary | ICD-10-CM | POA: Diagnosis not present

## 2023-07-30 DIAGNOSIS — G3184 Mild cognitive impairment, so stated: Secondary | ICD-10-CM | POA: Diagnosis not present

## 2023-07-30 DIAGNOSIS — N3281 Overactive bladder: Secondary | ICD-10-CM

## 2023-07-30 NOTE — Progress Notes (Signed)
 [facility] Return Visit  SUBJECTIVE  History of Present Illness: Beth [name] is a 73 y.o. female seen in follow-up for mixed incontinence  Urodynamic Impression:  1. Sensation was normal; capacity was normal 2. Stress Incontinence was demonstrated at normal pressures; 3. Detrusor Overactivity was demonstrated with leakage. 4. Emptying was dysfunctional with a elevated PVR ( ), a sustained detrusor contraction present (increased pressure),  abdominal straining present, dyssynergic urethral sphincter activity on EMG.  Past Medical History: Patient  has a past medical history of Abscessed tooth, Alzheimer dementia (HCC), Anemia, Anxiety, Arthritis, Bell's palsy (01/1985), COPD (chronic obstructive pulmonary disease) (HCC), Depression, Diabetes mellitus without complication (HCC), Femur fracture, left (HCC), Fibroid uterus (12/20/2014), Gallstones, GERD (gastroesophageal reflux disease), Hyperlipidemia, Hypertension, IBS (irritable bowel syndrome) (02/1988), Obesity hypoventilation syndrome (HCC) (11/19/2014), Obesity, Class III, BMI 40-49.9 (morbid obesity), OSA (obstructive sleep apnea), Osteopenia, Rotator cuff tear (2023), Sleep apnea, and Vitamin D  deficiency.   Past Surgical History: She  has a past surgical history that includes Colonoscopy (05/2005); Total knee arthroplasty (Left, 2012); Bladder suspension (03/2006, 07/2006, 01/2007); Total knee revision (2014); Tonsillectomy and adenoidectomy; Lumbar fusion (Right, 09/2016); Cholecystectomy (N/A, 12/08/2018); Total knee revision (Left, 08/2018); Colonoscopy with propofol  (N/A, 06/27/2021); polypectomy (06/27/2021); Hemostasis clip placement (06/27/2021); Joint replacement (2012.2014.2020); and Spine surgery (2018).   Medications: She has a current medication list which includes the following prescription(s): alprazolam , b complex-biotin-fa, biotin, vitamin d , clobetasol  propionate e, b-12, leqembi , linzess , magnesium,  milk thistle, nystatin , omega-3 fatty acids, rosuvastatin , turmeric, venlafaxine  xr, ascorbic acid, vitamin e, and zinc gluconate.   Allergies: Patient is allergic to penicillins, sulfa antibiotics, hydrocodone , celebrex [celecoxib], ciprofloxacin, codeine, estradiol , levaquin [levofloxacin], and motrin ib [ibuprofen].   Social History: Patient  reports that she has been smoking cigarettes. She started smoking about 52 years ago. She has a 39.3 pack-year smoking history. She has been exposed to tobacco smoke. She has never used smokeless tobacco. She reports that she does not drink alcohol and does not use drugs.     OBJECTIVE     Physical Exam: Vitals:   07/30/23 1115  BP: 137/88  Pulse: 74   Gen: No apparent distress, A&O x 3.  Detailed Urogynecologic Evaluation:  Deferred.    ASSESSMENT AND PLAN    [name] is a 73 y.o. with:  1. Incomplete bladder emptying   2. SUI (stress urinary incontinence, female)   3. Overactive bladder   4. Incontinence of feces, unspecified fecal incontinence type   5. Erosion of bladder suspension mesh, initial encounter (HCC)     - We discussed that urodynamics demonstrated incomplete bladder emptying, along with SUI and detrusor overactivity.  - Testing showed that there appears to be obstruction and she has a history of a sling in 2008. Unclear if patient had urodynamic testing following her sling. She has been seeing [facility] and records previously requested- their office was called today but they are unable to send them until next week.  - Discussed with patient that would recommend removal of exposed mesh and urethral portion of the sling to relieve obstruction. Although this far out from surgery, it is of unclear benefit. She may also develop worsening of her SUI. Will have her return for self cath teaching with the nurse and she will keep a PVR bladder diary for 3 days.  - We discussed that if her incomplete emptying is not due to a  sling, she may benefit from a sacral nerve stimulator. This will also treat OAB and fecal  incontinence symptoms. Will plan for sling release then reassess need for this device vs urethral bulking for SUI   Arma Lamp, MD

## 2023-08-03 ENCOUNTER — Ambulatory Visit (INDEPENDENT_AMBULATORY_CARE_PROVIDER_SITE_OTHER)

## 2023-08-03 ENCOUNTER — Encounter: Payer: Self-pay | Admitting: Obstetrics and Gynecology

## 2023-08-03 ENCOUNTER — Ambulatory Visit: Payer: Self-pay | Admitting: Neurology

## 2023-08-03 ENCOUNTER — Telehealth: Payer: Self-pay | Admitting: Neurology

## 2023-08-03 DIAGNOSIS — R339 Retention of urine, unspecified: Secondary | ICD-10-CM

## 2023-08-03 NOTE — Patient Instructions (Signed)
 Please keep all future appointments and if you have any questions or concerns please feel free to contact our office at [phone].

## 2023-08-03 NOTE — Progress Notes (Addendum)
 Beth [name] is a 73 y.o. female came in for a cath teaching.  Pt was not able to demonstrate self-cathing with a 12 fr catheter.  Pt used Betadine to clean the urethra and lubricant for cathing. Teaching material was given to the patient along with sample catheters.

## 2023-08-03 NOTE — Telephone Encounter (Signed)
 LVM and sent mychart msg informing pt of need to reschedule 12/21/23 appt - MD out

## 2023-08-11 ENCOUNTER — Encounter: Payer: Self-pay | Admitting: Obstetrics and Gynecology

## 2023-08-13 ENCOUNTER — Telehealth: Payer: Self-pay | Admitting: Obstetrics and Gynecology

## 2023-08-13 NOTE — Telephone Encounter (Signed)
 Pt called in today very confused about surgery being scheduled and needed to clear up the reasons for follow up appointment v/s coming in for preop.  I read off notes to her on why she is having the surgery. She wants to receive a call from you with a brief reason on the follow up and scheduling surgery.  Told her you would be returning on Monday.

## 2023-08-17 ENCOUNTER — Encounter: Payer: Self-pay | Admitting: Obstetrics and Gynecology

## 2023-08-17 LAB — HM DIABETES EYE EXAM

## 2023-08-17 NOTE — Telephone Encounter (Signed)
 Pt called and left VM to return call. Has office visit tomorrow

## 2023-08-18 ENCOUNTER — Ambulatory Visit (INDEPENDENT_AMBULATORY_CARE_PROVIDER_SITE_OTHER): Admitting: Obstetrics and Gynecology

## 2023-08-18 ENCOUNTER — Encounter: Payer: Self-pay | Admitting: Obstetrics and Gynecology

## 2023-08-18 VITALS — BP 152/85 | HR 81 | Ht 63.0 in | Wt 167.0 lb

## 2023-08-18 DIAGNOSIS — Z01818 Encounter for other preprocedural examination: Secondary | ICD-10-CM | POA: Diagnosis not present

## 2023-08-18 DIAGNOSIS — T83718A Erosion of other implanted mesh and other prosthetic materials to surrounding organ or tissue, initial encounter: Secondary | ICD-10-CM

## 2023-08-18 MED ORDER — POLYETHYLENE GLYCOL 3350 17 GM/SCOOP PO POWD: 17.0000 g | Freq: Every day | ORAL | 0 refills | Status: AC

## 2023-08-18 MED ORDER — OXYCODONE HCL 5 MG PO TABS
5.0000 mg | ORAL_TABLET | ORAL | 0 refills | Status: DC | PRN
Start: 2023-08-18 — End: 2023-11-23

## 2023-08-18 MED ORDER — ACETAMINOPHEN 500 MG PO TABS: 500.0000 mg | ORAL_TABLET | Freq: Four times a day (QID) | ORAL | 0 refills | Status: AC | PRN

## 2023-08-18 NOTE — H&P (Signed)
 [facility] Pre Op H&P  Subjective Chief Complaint: Beth [name] presents to follow up for incontinence and incomplete bladder emptying.   History of Present Illness: Beth [name] is a 73 y.o. female who is scheduled to undergo Exam under anesthesia, excision of exposed vaginal mesh, cystoscopy on 08/31/23.  Her symptoms include vaginal mesh exposure and incomplete bladder emptying.  Urodynamic Impression:  1. Sensation was normal; capacity was normal 2. Stress Incontinence was demonstrated at normal pressures; 3. Detrusor Overactivity was demonstrated with leakage. 4. Emptying was dysfunctional with a elevated PVR ( ), a sustained detrusor contraction present (increased pressure),  abdominal straining present, dyssynergic urethral sphincter activity on EMG.   Past Medical History:  Diagnosis Date   Abscessed tooth    Alzheimer dementia (HCC)    Anemia    Anxiety    Arthritis    Bell's palsy 01/1985   COPD (chronic obstructive pulmonary disease) (HCC)    Depression    Diabetes mellitus without complication (HCC)    Femur fracture, left (HCC)    Fibroid uterus 12/20/2014   7cm, 3.4cm and 1.9cm fibroids    Gallstones    GERD (gastroesophageal reflux disease)    Hyperlipidemia    Hypertension    IBS (irritable bowel syndrome) 02/1988   Obesity hypoventilation syndrome (HCC) 11/19/2014   Obesity, Class III, BMI 40-49.9 (morbid obesity)    OSA (obstructive sleep apnea)    no cpap   Osteopenia    Rotator cuff tear 2023   with bicep tear   Sleep apnea    resolved 25 years ago   Vitamin D  deficiency      Past Surgical History:  Procedure Laterality Date   BLADDER SUSPENSION  03/2006, 07/2006, 01/2007   for stress incontinence   CHOLECYSTECTOMY N/A 12/08/2018   Procedure: LAPAROSCOPIC CHOLECYSTECTOMY;  Surgeon: Oza Blumenthal, MD;  Location: [facility] OR;  Service: General;  Laterality: N/A;   COLONOSCOPY  05/2005   Normal, recheck 10 yrs   COLONOSCOPY  WITH PROPOFOL  N/A 06/27/2021   Procedure: COLONOSCOPY WITH PROPOFOL ;  Surgeon: Alvis Jourdain, MD;  Location: [facility];  Service: Endoscopy;  Laterality: N/A;   HEMOSTASIS CLIP PLACEMENT  06/27/2021   Procedure: HEMOSTASIS CLIP PLACEMENT;  Surgeon: Alvis Jourdain, MD;  Location: [facility];  Service: Endoscopy;;   JOINT REPLACEMENT  2012.2014.2020   right knee   LUMBAR FUSION Right 09/2016   L4-5 fusion by Dr. Waymond Hailey   POLYPECTOMY  06/27/2021   Procedure: POLYPECTOMY;  Surgeon: Alvis Jourdain, MD;  Location: [facility];  Service: Endoscopy;;   SPINE SURGERY  2018   TONSILLECTOMY AND ADENOIDECTOMY     TOTAL KNEE ARTHROPLASTY Left 2012   x 2 left knee   TOTAL KNEE REVISION  2014   TOTAL KNEE REVISION Left 08/2018   Dr King Penning, [facility]    is allergic to penicillins, sulfa antibiotics, hydrocodone , celebrex [celecoxib], ciprofloxacin, codeine, estradiol , levaquin [levofloxacin], and motrin ib [ibuprofen].   Family History  Problem Relation Age of Onset   Dementia Mother    Arthritis Mother    Depression Mother    Hypertension Father    Diabetes Father    Parkinson's disease Father    Obesity Father    Stroke Father    Heart attack Paternal Grandfather 41       Died MI   Colon cancer Neg Hx    Esophageal cancer Neg Hx    Pancreatic cancer Neg Hx    Stomach cancer Neg Hx  Social History   Tobacco Use   Smoking status: Every Day    Current packs/day: 0.75    Average packs/day: 0.8 packs/day for 52.4 years (39.3 ttl pk-yrs)    Types: Cigarettes    Start date: 03/10/1971    Passive exposure: Current   Smokeless tobacco: Never   Tobacco comments:    1 pack/day from age [age], now down to 0.5  Vaping Use   Vaping status: Never Used  Substance Use Topics   Alcohol use: No   Drug use: No     Review of Systems was negative for a full 10 system review except as noted in the History of Present Illness.  No current facility-administered medications for  this encounter.  Current Outpatient Medications:    acetaminophen  (TYLENOL ) 500 MG tablet, Take 1 tablet (500 mg total) by mouth every 6 (six) hours as needed (pain)., Disp: 30 tablet, Rfl: 0   ALPRAZolam  (XANAX ) 0.25 MG tablet, Take 1/2 to 1 tablet 2 x / day if needed for anxiety. (Patient taking differently: Take 0.25 mg by mouth 2 (two) times daily.), Disp: 60 tablet, Rfl: 0   B Complex-Biotin-FA (B-100 COMPLEX PO), Take by mouth., Disp: , Rfl:    BIOTIN PO, Take 1 tablet by mouth daily., Disp: , Rfl:    Cholecalciferol (VITAMIN D ) 50 MCG (2000 UT) CAPS, Take 2,000 Units by mouth daily., Disp: , Rfl:    Clobetasol  Prop Emollient Base (CLOBETASOL  PROPIONATE E) 0.05 % emollient cream, Apply 1 Application topically 3 (three) times a week., Disp: 30 g, Rfl: 11   Cyanocobalamin (B-12) 100 MCG TABS, Take by mouth., Disp: , Rfl:    Lecanemab -irmb (LEQEMBI ) 200 MG/2ML SOLN, Inject 10 mg/kg into the vein every 14 (fourteen) days., Disp: , Rfl:    LINZESS  72 MCG capsule, TAKE 1 CAPSULE(72 MCG) BY MOUTH DAILY BEFORE BREAKFAST, Disp: 30 capsule, Rfl: 2   Magnesium 500 MG TABS, Take 500 mg by mouth daily., Disp: , Rfl:    milk thistle 175 MG tablet, Take 175 mg by mouth daily., Disp: , Rfl:    NYSTATIN  powder, APPLY TO AFFECTED AREAS THREE TIMES DAILY, Disp: 15 g, Rfl: 0   Omega-3 Fatty Acids (FISH OIL OMEGA-3 PO), Take 1 capsule by mouth daily., Disp: , Rfl:    oxyCODONE  (OXY IR/ROXICODONE ) 5 MG immediate release tablet, Take 1 tablet (5 mg total) by mouth every 4 (four) hours as needed for severe pain (pain score 7-10)., Disp: 5 tablet, Rfl: 0   polyethylene glycol powder (GLYCOLAX/MIRALAX) 17 GM/SCOOP powder, Take 17 g by mouth daily. Drink 17g (1 scoop) dissolved in water per day., Disp: 255 g, Rfl: 0   rosuvastatin  (CRESTOR ) 10 MG tablet, TAKE 1 TABLET BY MOUTH DAILY FOR CHOLESTEROL, Disp: 90 tablet, Rfl: 3   TURMERIC PO, Take by mouth., Disp: , Rfl:    venlafaxine  XR (EFFEXOR -XR) 150 MG 24 hr  capsule, Take 150 mg by mouth 2 (two) times daily. , Disp: , Rfl:    vitamin C (ASCORBIC ACID) 500 MG tablet, Take 500 mg by mouth daily., Disp: , Rfl:    vitamin E 180 MG (400 UNITS) capsule, Take 400 Units by mouth daily., Disp: , Rfl:    zinc gluconate 50 MG tablet, Take 50 mg by mouth daily., Disp: , Rfl:    Objective There were no vitals filed for this visit.   Gen: NAD CV: S1 S2 RRR Lungs: Clear to auscultation bilaterally Abd: soft, nontender   Previous Pelvic Exam showed:  CST: positive, small leak present   Speculum exam reveals normal vaginal mucosa with atrophy. Two small areas of mesh erosion in midline distal urethra (each approx 0.5cm). Cervix normal appearance. Uterus normal single, nontender. Adnexa no mass, fullness, tenderness.     Assessment/ Plan  The patient is a 73 y.o. year old scheduled to undergo Exam under anesthesia, excision of exposed vaginal mesh, cystoscopy.      Arma Lamp, MD

## 2023-08-18 NOTE — Progress Notes (Signed)
 [facility] Return visit  Subjective Chief Complaint: Beth [name] presents to follow up for incontinence and incomplete bladder emptying.   History of Present Illness: HAGAN MALTZ is a 73 y.o. female who is scheduled to undergo Exam under anesthesia, excision of exposed vaginal mesh, cystoscopy on 08/31/23.  Her symptoms include vaginal mesh exposure and incomplete bladder emptying.  Urodynamic Impression:  1. Sensation was normal; capacity was normal 2. Stress Incontinence was demonstrated at normal pressures; 3. Detrusor Overactivity was demonstrated with leakage. 4. Emptying was dysfunctional with a elevated PVR ( ), a sustained detrusor contraction present (increased pressure),  abdominal straining present, dyssynergic urethral sphincter activity on EMG.  Pt had previously been receiving intravesical botox injections. Records received from [facility] had not been repeated after her sling. She attempted to do self cath PVR but she could not insert the catheter unless she was standing, and this caused urine to spray, so she was unable to measure. She brought her bladder diary today and it will be scanned into media.   Past Medical History:  Diagnosis Date   Abscessed tooth    Alzheimer dementia (HCC)    Anemia    Anxiety    Arthritis    Bell's palsy 01/1985   COPD (chronic obstructive pulmonary disease) (HCC)    Depression    Diabetes mellitus without complication (HCC)    Femur fracture, left (HCC)    Fibroid uterus 12/20/2014   7cm, 3.4cm and 1.9cm fibroids    Gallstones    GERD (gastroesophageal reflux disease)    Hyperlipidemia    Hypertension    IBS (irritable bowel syndrome) 02/1988   Obesity hypoventilation syndrome (HCC) 11/19/2014   Obesity, Class III, BMI 40-49.9 (morbid obesity)    OSA (obstructive sleep apnea)    no cpap   Osteopenia    Rotator cuff tear 2023   with bicep tear   Sleep apnea    resolved 25 years ago    Vitamin D  deficiency      Past Surgical History:  Procedure Laterality Date   BLADDER SUSPENSION  03/2006, 07/2006, 01/2007   for stress incontinence   CHOLECYSTECTOMY N/A 12/08/2018   Procedure: LAPAROSCOPIC CHOLECYSTECTOMY;  Surgeon: Oza Blumenthal, MD;  Location: [facility] OR;  Service: General;  Laterality: N/A;   COLONOSCOPY  05/2005   Normal, recheck 10 yrs   COLONOSCOPY WITH PROPOFOL  N/A 06/27/2021   Procedure: COLONOSCOPY WITH PROPOFOL ;  Surgeon: Alvis Jourdain, MD;  Location: [facility];  Service: Endoscopy;  Laterality: N/A;   HEMOSTASIS CLIP PLACEMENT  06/27/2021   Procedure: HEMOSTASIS CLIP PLACEMENT;  Surgeon: Alvis Jourdain, MD;  Location: [facility];  Service: Endoscopy;;   JOINT REPLACEMENT  2012.2014.2020   right knee   LUMBAR FUSION Right 09/2016   L4-5 fusion by Dr. Waymond Hailey   POLYPECTOMY  06/27/2021   Procedure: POLYPECTOMY;  Surgeon: Alvis Jourdain, MD;  Location: [facility];  Service: Endoscopy;;   SPINE SURGERY  2018   TONSILLECTOMY AND ADENOIDECTOMY     TOTAL KNEE ARTHROPLASTY Left 2012   x 2 left knee   TOTAL KNEE REVISION  2014   TOTAL KNEE REVISION Left 08/2018   Dr King Penning, [facility]    is allergic to penicillins, sulfa antibiotics, hydrocodone , celebrex [celecoxib], ciprofloxacin, codeine, estradiol , levaquin [levofloxacin], and motrin ib [ibuprofen].   Family History  Problem Relation Age of Onset   Dementia Mother    Arthritis Mother    Depression Mother    Hypertension Father  Diabetes Father    Parkinson's disease Father    Obesity Father    Stroke Father    Heart attack Paternal Grandfather 58       Died MI   Colon cancer Neg Hx    Esophageal cancer Neg Hx    Pancreatic cancer Neg Hx    Stomach cancer Neg Hx     Social History   Tobacco Use   Smoking status: Every Day    Current packs/day: 0.75    Average packs/day: 0.8 packs/day for 52.4 years (39.3 ttl pk-yrs)    Types: Cigarettes    Start date: 03/10/1971     Passive exposure: Current   Smokeless tobacco: Never   Tobacco comments:    1 pack/day from age [age], now down to 0.5  Vaping Use   Vaping status: Never Used  Substance Use Topics   Alcohol use: No   Drug use: No     Review of Systems was negative for a full 10 system review except as noted in the History of Present Illness.   Current Outpatient Medications:    acetaminophen  (TYLENOL ) 500 MG tablet, Take 1 tablet (500 mg total) by mouth every 6 (six) hours as needed (pain)., Disp: 30 tablet, Rfl: 0   ALPRAZolam  (XANAX ) 0.25 MG tablet, Take 1/2 to 1 tablet 2 x / day if needed for anxiety. (Patient taking differently: Take 0.25 mg by mouth 2 (two) times daily.), Disp: 60 tablet, Rfl: 0   B Complex-Biotin-FA (B-100 COMPLEX PO), Take by mouth., Disp: , Rfl:    BIOTIN PO, Take 1 tablet by mouth daily., Disp: , Rfl:    Cholecalciferol (VITAMIN D ) 50 MCG (2000 UT) CAPS, Take 2,000 Units by mouth daily., Disp: , Rfl:    Clobetasol  Prop Emollient Base (CLOBETASOL  PROPIONATE E) 0.05 % emollient cream, Apply 1 Application topically 3 (three) times a week., Disp: 30 g, Rfl: 11   Cyanocobalamin (B-12) 100 MCG TABS, Take by mouth., Disp: , Rfl:    Lecanemab -irmb (LEQEMBI ) 200 MG/2ML SOLN, Inject 10 mg/kg into the vein every 14 (fourteen) days., Disp: , Rfl:    LINZESS  72 MCG capsule, TAKE 1 CAPSULE(72 MCG) BY MOUTH DAILY BEFORE BREAKFAST, Disp: 30 capsule, Rfl: 2   Magnesium 500 MG TABS, Take 500 mg by mouth daily., Disp: , Rfl:    milk thistle 175 MG tablet, Take 175 mg by mouth daily., Disp: , Rfl:    NYSTATIN  powder, APPLY TO AFFECTED AREAS THREE TIMES DAILY, Disp: 15 g, Rfl: 0   Omega-3 Fatty Acids (FISH OIL OMEGA-3 PO), Take 1 capsule by mouth daily., Disp: , Rfl:    oxyCODONE  (OXY IR/ROXICODONE ) 5 MG immediate release tablet, Take 1 tablet (5 mg total) by mouth every 4 (four) hours as needed for severe pain (pain score 7-10)., Disp: 5 tablet, Rfl: 0   polyethylene glycol powder  (GLYCOLAX/MIRALAX) 17 GM/SCOOP powder, Take 17 g by mouth daily. Drink 17g (1 scoop) dissolved in water per day., Disp: 255 g, Rfl: 0   rosuvastatin  (CRESTOR ) 10 MG tablet, TAKE 1 TABLET BY MOUTH DAILY FOR CHOLESTEROL, Disp: 90 tablet, Rfl: 3   TURMERIC PO, Take by mouth., Disp: , Rfl:    venlafaxine  XR (EFFEXOR -XR) 150 MG 24 hr capsule, Take 150 mg by mouth 2 (two) times daily. , Disp: , Rfl:    vitamin C (ASCORBIC ACID) 500 MG tablet, Take 500 mg by mouth daily., Disp: , Rfl:    vitamin E 180 MG (400 UNITS) capsule, Take 400  Units by mouth daily., Disp: , Rfl:    zinc gluconate 50 MG tablet, Take 50 mg by mouth daily., Disp: , Rfl:    Objective Vitals:   08/18/23 0919  BP: (!) 152/85  Pulse: 81    Gen: NAD CV: S1 S2 RRR Lungs: Clear to auscultation bilaterally Abd: soft, nontender   Previous Pelvic Exam showed: CST: positive, small leak present   Speculum exam reveals normal vaginal mucosa with atrophy. Two small areas of mesh erosion in midline distal urethra (each approx 0.5cm). Cervix normal appearance. Uterus normal single, nontender. Adnexa no mass, fullness, tenderness.     Assessment/ Plan  Assessment: The patient is a 73 y.o. year old scheduled to undergo Exam under anesthesia, excision of exposed vaginal mesh, cystoscopy.   Plan: General Surgical Consent: The patient has previously been counseled on alternative treatments, and the decision by the patient and provider was to proceed with the procedure listed above.  For all procedures, there are risks of bleeding, infection, damage to surrounding organs including but not limited to bowel, bladder, blood vessels, ureters and nerves, and need for further surgery if an injury were to occur. These risks are all low with minimally invasive surgery.   There are risks of numbness and weakness at any body site or buttock/rectal pain.  It is possible that baseline pain can be worsened by surgery, either with or without mesh. If  surgery is vaginal, there is also a low risk of possible conversion to laparoscopy or open abdominal incision where indicated. Very rare risks include blood transfusion, blood clot, heart attack, pneumonia, or death.   There is also a risk of short-term postoperative urinary retention with need to use a catheter. About half of patients need to go home from surgery with a catheter, which is then later removed in the office. The risk of long-term need for a catheter is very low. There is also a risk of worsening of overactive bladder.   We discussed consent for blood products. Risks for blood transfusion include allergic reactions, other reactions that can affect different body organs and managed accordingly, transmission of infectious diseases such as HIV or Hepatitis. However, the blood is screened. Patient consents for blood products.  Pre-operative instructions:  She was instructed to not take Aspirin /NSAIDs x 7days prior to surgery.  Antibiotic prophylaxis was ordered as indicated.  Catheter use: Patient will go home with foley if needed after post-operative voiding trial.  Post-operative instructions:  She was provided with specific post-operative instructions, including precautions and signs/symptoms for which we would recommend contacting us , in addition to daytime and after-hours contact phone numbers. This was provided on a handout.   Post-operative medications: Prescriptions for  tylenol , miralax, and oxycodone  were sent to her pharmacy.   Laboratory testing:  No labs needed  Preoperative clearance:  She does not require surgical clearance.    Post-operative follow-up:  A post-operative appointment will be made for 6 weeks from the date of surgery. If she needs a post-operative nurse visit for a voiding trial, that will be set up after she leaves the hospital.    Patient will call the clinic or use MyChart should anything change or any new issues arise.   Arma Lamp,  MD  Time spent: I spent 25 minutes dedicated to the care of this patient on the date of this encounter to include pre-visit review of records, face-to-face time with the patient and post visit documentation and ordering medication/ testing.

## 2023-08-19 ENCOUNTER — Encounter: Admitting: Obstetrics and Gynecology

## 2023-08-25 NOTE — Telephone Encounter (Signed)
 I left patient a message to reschedule. KD CMA

## 2023-08-26 ENCOUNTER — Other Ambulatory Visit: Admitting: Obstetrics and Gynecology

## 2023-09-02 NOTE — Telephone Encounter (Signed)
 Not has been updated

## 2023-09-08 ENCOUNTER — Ambulatory Visit: Payer: Medicare Other | Admitting: Nurse Practitioner

## 2023-09-09 ENCOUNTER — Telehealth: Payer: Self-pay

## 2023-09-09 NOTE — Telephone Encounter (Signed)
 Per Cecillia RN, had her 15th infusion yesterday and her next appt is 7/16

## 2023-09-11 ENCOUNTER — Other Ambulatory Visit: Payer: Self-pay | Admitting: Family Medicine

## 2023-09-11 DIAGNOSIS — E782 Mixed hyperlipidemia: Secondary | ICD-10-CM

## 2023-09-11 DIAGNOSIS — E1122 Type 2 diabetes mellitus with diabetic chronic kidney disease: Secondary | ICD-10-CM

## 2023-09-13 ENCOUNTER — Ambulatory Visit
Admission: EM | Admit: 2023-09-13 | Discharge: 2023-09-13 | Disposition: A | Discharge status: Home / Self Care | Source: Ambulatory Visit | Attending: Family Medicine | Admitting: Family Medicine

## 2023-09-13 ENCOUNTER — Ambulatory Visit: Payer: Self-pay

## 2023-09-13 DIAGNOSIS — N3001 Acute cystitis with hematuria: Secondary | ICD-10-CM | POA: Insufficient documentation

## 2023-09-13 LAB — POCT URINALYSIS DIP (MANUAL ENTRY)
Bilirubin, UA: NEGATIVE
Blood, UA: NEGATIVE
Glucose, UA: NEGATIVE mg/dL
Ketones, POC UA: NEGATIVE mg/dL
Nitrite, UA: NEGATIVE
Protein Ur, POC: NEGATIVE mg/dL
Spec Grav, UA: 1.01 (ref 1.010–1.025)
Urobilinogen, UA: 0.2 U/dL
pH, UA: 5.5 (ref 5.0–8.0)

## 2023-09-13 MED ORDER — FOSFOMYCIN TROMETHAMINE 3 G PO PACK: 3.0000 g | PACK | Freq: Once | ORAL | 0 refills | Status: AC

## 2023-09-13 NOTE — Discharge Instructions (Addendum)
 Take fosfomycin once.  The clinic will send your urine for a culture and contact you with these results.  Lots of rest and fluids.  Please follow-up with your PCP in 2 to 3 days for recheck.  Please go to the ER if you develop any worsening symptoms.  Hope you feel better soon!

## 2023-09-13 NOTE — ED Triage Notes (Signed)
 Pt present with urinary frequency, urgency and back pain x six days. Pt also states she has incontinence. C/o darker urine and denies foul odor.

## 2023-09-13 NOTE — Telephone Encounter (Signed)
**Note Beth-Identified via Obfuscation**  FYI Only or Action Required?: FYI only for provider.  Patient was last seen in primary care on 05/20/2023 by Beth [name], Beth PARAS, MD. Called Nurse Triage reporting Dysuria, Urinary Frequency, and Back Pain. Symptoms began several days ago. Interventions attempted: OTC medications: sleeping pill for interrupted sleep with symptoms and Rest, hydration, or home remedies. Symptoms are: gradually worsening.  Triage Disposition: See HCP Within 4 Hours (Or PCP Triage)  Patient/caregiver understands and will follow disposition?: Yes - No PCP availability, heading to UC       Copied from CRM [id number]. Topic: Clinical - Red Word Triage >> Sep 13, 2023  9:37 AM Tobias L wrote: Red Word that prompted transfer to Nurse Triage: burning while urinating, back pain, frequency urinating, Reason for Disposition  Side (flank) or lower back pain present  Answer Assessment - Initial Assessment Questions 1. SEVERITY: How bad is the pain?  (e.g., Scale 1-10; mild, moderate, or severe)   - MILD (1-3): complains slightly about urination hurting   - MODERATE (4-7): interferes with normal activities     - SEVERE (8-10): excruciating, unwilling or unable to urinate because of the pain      Pain is mostly at night, 5-6/10 having to take sleeping pill to get some sleep, not pain with urination just burning irritation with urination, pain in back as soon as lay down 2. FREQUENCY: How many times have you had painful urination today?      Up 2-3x early this morning maybe 4-5x today since 4 am 3. PATTERN: Is pain present every time you urinate or just sometimes?      Irritation every time urinate, pain is more when lay down, not noticing pain during day 4. ONSET: When did the painful urination start?      Symptoms started 4 days ago and just been getting worse 5. FEVER: Do you have a fever? If Yes, ask: What is your temperature, how was it measured, and when did it start?     Don't think so, can't find  thermometer but don't feel like have a fever 6. PAST UTI: Have you had a urine infection before? If Yes, ask: When was the last time? and What happened that time?      Hx UTI in past, more importantly have incontinence, having surgery in 3 weeks for bladder 8. OTHER SYMPTOMS: Do you have any other symptoms? (e.g., blood in urine, flank pain, genital sores, urgency, vaginal discharge)     Flank pain, urinary frequency, urinary urgency and incontinence ongoing problem, don't think so with vaginal discharge or genital sores, don't see blood in urine  Protocols used: Urination Pain - Female-A-AH

## 2023-09-13 NOTE — ED Provider Notes (Signed)
 [facility]    CSN: [id number] Arrival date & time: 09/13/23  1025      History   Chief Complaint Chief Complaint  Patient presents with   Urinary Frequency    HPI Beth [name] is a 73 y.o. female presents for dysuria.  Patient reports 6 days of urinary urgency frequency, intermittent dysuria, and low back pain.  Also endorses some urinary incontinence but states this is not abnormal for her and is scheduled to have surgery in August for this.  Denies any hematuria, fevers, nausea/vomiting, flank pain.  No vaginal discharge.  Reports a history of UTIs in the past.  No OTC treatments have been used since onset.  No other concerns at this time.   Urinary Frequency    Past Medical History:  Diagnosis Date   Abscessed tooth    Alzheimer dementia (HCC)    Anemia    Anxiety    Arthritis    Bell's palsy 01/1985   COPD (chronic obstructive pulmonary disease) (HCC)    Depression    Diabetes mellitus without complication (HCC)    Femur fracture, left (HCC)    Fibroid uterus 12/20/2014   7cm, 3.4cm and 1.9cm fibroids    Gallstones    GERD (gastroesophageal reflux disease)    Hyperlipidemia    Hypertension    IBS (irritable bowel syndrome) 02/1988   Obesity hypoventilation syndrome (HCC) 11/19/2014   Obesity, Class III, BMI 40-49.9 (morbid obesity)    OSA (obstructive sleep apnea)    no cpap   Osteopenia    Rotator cuff tear 2023   with bicep tear   Sleep apnea    resolved 25 years ago   Vitamin D  deficiency     Patient Active Problem List   Diagnosis Date Noted   Hematuria 07/22/2023   SUI (stress urinary incontinence, female) 06/08/2023   Incontinence of feces 06/08/2023   Diabetes mellitus (HCC) 05/20/2023   Erosion of bladder suspension mesh (HCC) 03/16/2023   Right rotator cuff tear 09/06/2021   History of adenomatous polyp of colon 08/14/2021   Lichen sclerosus 01/05/2021   Hepatic steatosis 02/07/2020   Overactive bladder 02/07/2020   CKD stage  2 due to type 2 diabetes mellitus (HCC) 02/06/2020   Aortic atherosclerosis by CTscan Jan 2021 04/01/2019   Overweight (BMI 25.0-29.9) 01/25/2019   S/P laparoscopic cholecystectomy 12/08/2018   Hyperlipidemia associated with type 2 diabetes mellitus (HCC) 06/27/2018   FHx: heart disease 02/14/2018   Osteopenia 11/09/2017   Seasonal allergies 12/15/2016   COPD (chronic obstructive pulmonary disease) (HCC) 12/20/2014   OSA and COPD overlap syndrome (HCC) 11/19/2014   GERD  11/19/2014   Medication management 09/27/2013   T2_NIDDM w/CKD 2  (HCC)    Vitamin D  deficiency    Iron deficiency anemia 06/17/2012   Smoker 06/16/2012   Essential hypertension 06/02/2012   Depression, major, 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   IBS (irritable bowel syndrome) 02/07/1988   Bell's palsy 01/07/1985    Past Surgical History:  Procedure Laterality Date   BLADDER SUSPENSION  03/2006, 07/2006, 01/2007   for stress incontinence   CHOLECYSTECTOMY N/A 12/08/2018   Procedure: LAPAROSCOPIC CHOLECYSTECTOMY;  Surgeon: Vernetta Berg, MD;  Location: [facility] OR;  Service: General;  Laterality: N/A;   COLONOSCOPY  05/2005   Normal, recheck 10 yrs   COLONOSCOPY WITH PROPOFOL  N/A 06/27/2021   Procedure: COLONOSCOPY WITH PROPOFOL ;  Surgeon: Rollin Dover, MD;  Location: [facility];  Service: Endoscopy;  Laterality: N/A;   HEMOSTASIS CLIP PLACEMENT  06/27/2021   Procedure: HEMOSTASIS CLIP PLACEMENT;  Surgeon: Rollin Dover, MD;  Location: [facility];  Service: Endoscopy;;   JOINT REPLACEMENT  2012.2014.2020   right knee   LUMBAR FUSION Right 09/2016   L4-5 fusion by Dr. Alm Molt   POLYPECTOMY  06/27/2021   Procedure: POLYPECTOMY;  Surgeon: Rollin Dover, MD;  Location: [facility];  Service: Endoscopy;;   SPINE SURGERY  2018   TONSILLECTOMY AND ADENOIDECTOMY     TOTAL KNEE ARTHROPLASTY Left 2012   x 2 left knee   TOTAL KNEE REVISION  2014   TOTAL KNEE REVISION Left 08/2018   Dr Omar, [facility]     OB History     Gravida  3   Para  1   Term  1   Preterm      AB  2   Living  1      SAB      IAB  2   Ectopic      Multiple      Live Births  1            Home Medications    Prior to Admission medications   Medication Sig Start Date End Date Taking? Authorizing Provider  fosfomycin (MONUROL ) 3 g PACK Take 3 g by mouth once for 1 dose. 09/13/23 09/13/23 Yes Rishabh Rinkenberger, Jodi R, NP  acetaminophen  (TYLENOL ) 500 MG tablet Take 1 tablet (500 mg total) by mouth every 6 (six) hours as needed (pain). 08/18/23   Marilynne Rosaline SAILOR, MD  ALPRAZolam  (XANAX ) 0.25 MG tablet Take 1/2 to 1 tablet 2 x / day if needed for anxiety. Patient taking differently: Take 0.25 mg by mouth 2 (two) times daily. 07/19/18   Tonita Fallow, MD  B Complex-Biotin-FA (B-100 COMPLEX PO) Take by mouth.    [provider]  BIOTIN PO Take 1 tablet by mouth daily.    [provider]  Cholecalciferol (VITAMIN D ) 50 MCG (2000 UT) CAPS Take 2,000 Units by mouth daily.    [provider]  Clobetasol  Prop Emollient Base (CLOBETASOL  PROPIONATE E) 0.05 % emollient cream Apply 1 Application topically 3 (three) times a week. 06/09/23   Marilynne Rosaline SAILOR, MD  Cyanocobalamin (B-12) 100 MCG TABS Take by mouth.    [provider]  Lecanemab -irmb (LEQEMBI ) 200 MG/2ML SOLN Inject 10 mg/kg into the vein every 14 (fourteen) days. 07/12/23   Camara, Amadou, MD  LINZESS  72 MCG capsule TAKE 1 CAPSULE(72 MCG) BY MOUTH DAILY BEFORE BREAKFAST 05/31/23   de [place], Quintin PARAS, MD  Magnesium 500 MG TABS Take 500 mg by mouth daily.    [provider]  milk thistle 175 MG tablet Take 175 mg by mouth daily.    [provider]  NYSTATIN  powder APPLY TO AFFECTED AREAS THREE TIMES DAILY 07/01/23   Zuleta, Kaitlin G, NP  Omega-3 Fatty Acids (FISH OIL OMEGA-3 PO) Take 1 capsule by mouth daily.    [provider]  oxyCODONE  (OXY IR/ROXICODONE ) 5 MG immediate release tablet Take 1  tablet (5 mg total) by mouth every 4 (four) hours as needed for severe pain (pain score 7-10). 08/18/23   Marilynne Rosaline SAILOR, MD  polyethylene glycol powder (GLYCOLAX /MIRALAX ) 17 GM/SCOOP powder Take 17 g by mouth daily. Drink 17g (1 scoop) dissolved in water per day. 08/18/23   Marilynne Rosaline SAILOR, MD  rosuvastatin  (CRESTOR ) 10 MG tablet TAKE 1 TABLET BY MOUTH DAILY FOR CHOLESTEROL 03/26/23   Wilkinson, Dana E, FNP  TURMERIC PO  Take by mouth.    [provider]  venlafaxine  XR (EFFEXOR -XR) 150 MG 24 hr capsule Take 150 mg by mouth 2 (two) times daily.     [provider]  vitamin C (ASCORBIC ACID) 500 MG tablet Take 500 mg by mouth daily.    [provider]  vitamin E 180 MG (400 UNITS) capsule Take 400 Units by mouth daily.    [provider]  zinc gluconate 50 MG tablet Take 50 mg by mouth daily.    [provider]    Family History Family History  Problem Relation Age of Onset   Dementia Mother    Arthritis Mother    Depression Mother    Hypertension Father    Diabetes Father    Parkinson's disease Father    Obesity Father    Stroke Father    Heart attack Paternal Grandfather 63       Died MI   Colon cancer Neg Hx    Esophageal cancer Neg Hx    Pancreatic cancer Neg Hx    Stomach cancer Neg Hx     Social History Social History   Tobacco Use   Smoking status: Every Day    Current packs/day: 0.75    Average packs/day: 0.8 packs/day for 52.5 years (39.4 ttl pk-yrs)    Types: Cigarettes    Start date: 03/10/1971    Passive exposure: Current   Smokeless tobacco: Never   Tobacco comments:    1 pack/day from age [age], now down to 0.5  Vaping Use   Vaping status: Never Used  Substance Use Topics   Alcohol use: No   Drug use: No     Allergies   Penicillins, Sulfa antibiotics, Hydrocodone , Celebrex [celecoxib], Ciprofloxacin, Codeine, Estradiol , Levaquin [levofloxacin], and Motrin ib [ibuprofen]   Review of Systems Review  of Systems  Genitourinary:  Positive for frequency and urgency.     Physical Exam Triage Vital Signs ED Triage Vitals  Encounter Vitals Group     BP 09/13/23 1049 (!) 164/94     Girls Systolic BP Percentile --      Girls Diastolic BP Percentile --      Boys Systolic BP Percentile --      Boys Diastolic BP Percentile --      Pulse Rate 09/13/23 1049 82     Resp 09/13/23 1049 18     Temp 09/13/23 1049 (!) 97.3 F (36.3 C)     Temp Source 09/13/23 1049 Oral     SpO2 09/13/23 1049 97 %     Weight --      Height --      Head Circumference --      Peak Flow --      Pain Score 09/13/23 1048 5     Pain Loc --      Pain Education --      Exclude from Growth Chart --    No data found.  Updated Vital Signs BP (!) 164/94 (BP Location: Left Arm)   Pulse 82   Temp (!) 97.3 F (36.3 C) (Oral)   Resp 18   LMP 04/09/2009   SpO2 97%   Visual Acuity Right Eye Distance:   Left Eye Distance:   Bilateral Distance:    Right Eye Near:   Left Eye Near:    Bilateral Near:     Physical Exam Vitals and nursing note reviewed.  Constitutional:      Appearance: Normal appearance.  HENT:  Head: Normocephalic and atraumatic.  Eyes:     Pupils: Pupils are equal, round, and reactive to light.  Cardiovascular:     Rate and Rhythm: Normal rate.  Pulmonary:     Effort: Pulmonary effort is normal.  Abdominal:     Tenderness: There is no right CVA tenderness or left CVA tenderness.  Skin:    General: Skin is warm and dry.  Neurological:     General: No focal deficit present.     Mental Status: She is alert and oriented to person, place, and time.  Psychiatric:        Mood and Affect: Mood normal.        Behavior: Behavior normal.      UC Treatments / Results  Labs (all labs ordered are listed, but only abnormal results are displayed) Labs Reviewed  POCT URINALYSIS DIP (MANUAL ENTRY) - Abnormal; Notable for the following components:      Result Value   Leukocytes, UA Trace  (*)    All other components within normal limits  URINE CULTURE   Comprehensive metabolic panel Order: [id number]  Status: Final result     Next appt: 09/28/2023 at 01:20 PM in Urogynecology (Kaitlin G Zuleta, NP)   Test Result Released: Yes (seen)   0 Result Notes     1 HM Topic          Component Ref Range & Units (hover) 4 mo ago (05/09/23) 4 mo ago (05/07/23) 8 mo ago (12/24/22) 1 yr ago (09/03/22) 1 yr ago (05/21/22) 1 yr ago (02/02/22) 1 yr ago (11/20/21)  Sodium 136 139 142 R 143 R 144 R 142 R 140 R  Potassium 3.8 3.8 3.7 R 4.1 R 3.7 R 4.0 R 3.8 R  Chloride 101 104 105 R 107 R 106 R 105 R 106 R  CO2 25 29 30  R 28 R 30 R 28 R 27 R  Glucose, Bld 98 96 CM 102 High  R, CM 78 R, CM 126 High  R, CM 78 R, CM 117 High  R, CM  Comment: Glucose reference range applies only to samples taken after fasting for at least 8 hours.  BUN 18 19 23  R 16 R 16 R 18 R 25 R  Creatinine, Ser 0.77 0.75 0.72 R 0.69 R 0.68 R 0.70 R 0.70 R  Calcium  9.1 9.1 9.0 R 9.0 R 9.0 R 9.2 R 9.1 R  Total Protein 7.1  6.7 R 6.2 R 6.7 R 6.8 R 6.6 R  Albumin 3.7        AST 37  32 R 28 R 28 R 24 R 38 High  R  ALT 29  24 R 21 R 18 R 16 R 35 High  R  Alkaline Phosphatase 123        Total Bilirubin 0.6  0.4 R 0.4 R 0.3 R 0.5 R 0.3 R  GFR, Estimated >60 >60 CM       Comment: (NOTE) Calculated using the CKD-EPI Creatinine Equation (2021)  Anion gap 10 6 CM       Comment: Performed at [facility], [address] [place], [place] [address]  Resulting Agency [facility] CL[facility] [place] [facility] [place] [facility] [place] [facility] [place] [facility] [place]        Specimen Collected: 05/09/23 11:43 Last Resulted: 05/09/23 12:13    EKG   Radiology No results found.  Procedures Procedures (including critical care time)  Medications Ordered in UC Medications - No data to display  Initial Impression / Assessment and Plan / UC  Course  I have reviewed the triage vital signs and the nursing notes.  Pertinent labs & imaging results that were available during my care of the patient were reviewed by me and considered in my medical decision making (see chart for details).     Reviewed exam and symptoms with patient.  No red flags.  Urine with trace leuks otherwise unremarkable, will send for urine culture and contact for any positive results.  No CVAT patient is afebrile.  Given medication allergies/age will do fosfomycin x 1.  Advise rest fluids and PCP follow-up 2 to 3 days for recheck.  ER precautions reviewed. Final Clinical Impressions(s) / UC Diagnoses   Final diagnoses:  Acute cystitis with hematuria     Discharge Instructions      Take fosfomycin once.  The clinic will send your urine for a culture and contact you with these results.  Lots of rest and fluids.  Please follow-up with your PCP in 2 to 3 days for recheck.  Please go to the ER if you develop any worsening symptoms.  Hope you feel better soon!    ED Prescriptions     Medication Sig Dispense Auth. Provider   fosfomycin (MONUROL ) 3 g PACK Take 3 g by mouth once for 1 dose. 3 g Jayveion Stalling, Jodi R, NP      PDMP not reviewed this encounter.   Loreda Myla SAUNDERS, NP 09/13/23 [phone]

## 2023-09-15 LAB — URINE CULTURE: Culture: >=100000 — AB

## 2023-09-16 ENCOUNTER — Ambulatory Visit (HOSPITAL_COMMUNITY): Payer: Self-pay

## 2023-09-28 ENCOUNTER — Encounter (HOSPITAL_BASED_OUTPATIENT_CLINIC_OR_DEPARTMENT_OTHER): Payer: Self-pay

## 2023-09-28 ENCOUNTER — Encounter: Payer: Self-pay | Admitting: Obstetrics and Gynecology

## 2023-09-28 ENCOUNTER — Ambulatory Visit (INDEPENDENT_AMBULATORY_CARE_PROVIDER_SITE_OTHER): Admitting: Obstetrics and Gynecology

## 2023-09-28 ENCOUNTER — Other Ambulatory Visit (HOSPITAL_COMMUNITY)
Admission: RE | Admit: 2023-09-28 | Discharge: 2023-09-28 | Disposition: A | Discharge status: Home / Self Care | Source: Other Acute Inpatient Hospital | Attending: Obstetrics and Gynecology | Admitting: Obstetrics and Gynecology

## 2023-09-28 ENCOUNTER — Ambulatory Visit (INDEPENDENT_AMBULATORY_CARE_PROVIDER_SITE_OTHER): Admitting: *Deleted

## 2023-09-28 ENCOUNTER — Other Ambulatory Visit: Payer: Self-pay | Admitting: Obstetrics and Gynecology

## 2023-09-28 VITALS — BP 146/87 | HR 94 | Ht 62.99 in | Wt 162.6 lb

## 2023-09-28 DIAGNOSIS — R82998 Other abnormal findings in urine: Secondary | ICD-10-CM

## 2023-09-28 DIAGNOSIS — R300 Dysuria: Secondary | ICD-10-CM | POA: Diagnosis not present

## 2023-09-28 DIAGNOSIS — Z01818 Encounter for other preprocedural examination: Secondary | ICD-10-CM

## 2023-09-28 DIAGNOSIS — R319 Hematuria, unspecified: Secondary | ICD-10-CM

## 2023-09-28 DIAGNOSIS — Z0000 Encounter for general adult medical examination without abnormal findings: Secondary | ICD-10-CM | POA: Diagnosis not present

## 2023-09-28 LAB — URINALYSIS, ROUTINE W REFLEX MICROSCOPIC
Bilirubin Urine: NEGATIVE
Glucose, UA: NEGATIVE mg/dL
Ketones, ur: 5 mg/dL — AB
Nitrite: NEGATIVE
Protein, ur: 100 mg/dL — AB
Specific Gravity, Urine: 1.025 (ref 1.005–1.030)
WBC, UA: >50 WBC/hpf (ref 0–5)
pH: 5 (ref 5.0–8.0)

## 2023-09-28 LAB — POCT URINALYSIS DIP (CLINITEK)
Glucose, UA: NEGATIVE mg/dL
Nitrite, UA: NEGATIVE
POC PROTEIN,UA: 30 — AB
Spec Grav, UA: >=1.03 — AB (ref 1.010–1.025)
Urobilinogen, UA: 0.2 U/dL
pH, UA: 5.5 (ref 5.0–8.0)

## 2023-09-28 NOTE — Patient Instructions (Signed)
 [name] , Thank you for taking time to come for your Medicare Wellness Visit. I appreciate your ongoing commitment to your health goals. Please review the following plan we discussed and let me know if I can assist you in the future.   Screening recommendations/referrals: Colonoscopy: Due 06/28/2026 Mammogram: Due 12/11/2023 Bone Density: Due 08-21-2023 Recommended yearly ophthalmology/optometry visit for glaucoma screening and checkup Recommended yearly dental visit for hygiene and checkup  Vaccinations: Influenza vaccine: Completed Pneumococcal vaccine: Completed Tdap vaccine: Completed Shingles vaccine: Completed    Advanced directives: Have documents  Conditions/risks identified: Falls  Next appointment: 1 year   Preventive Care 65 Years and Older, Female Preventive care refers to lifestyle choices and visits with your health care provider that can promote health and wellness. What does preventive care include? A yearly physical exam. This is also called an annual well check. Dental exams once or twice a year. Routine eye exams. Ask your health care provider how often you should have your eyes checked. Personal lifestyle choices, including: Daily care of your teeth and gums. Regular physical activity. Eating a healthy diet. Avoiding tobacco and drug use. Limiting alcohol use. Practicing safe sex. Taking low-dose aspirin  every day. Taking vitamin and mineral supplements as recommended by your health care provider. What happens during an annual well check? The services and screenings done by your health care provider during your annual well check will depend on your age, overall health, lifestyle risk factors, and family history of disease. Counseling  Your health care provider may ask you questions about your: Alcohol use. Tobacco use. Drug use. Emotional well-being. Home and relationship well-being. Sexual activity. Eating habits. History of falls. Memory and  ability to understand (cognition). Work and work Astronomer. Reproductive health. Screening  You may have the following tests or measurements: Height, weight, and BMI. Blood pressure. Lipid and cholesterol levels. These may be checked every 5 years, or more frequently if you are over 36 years old. Skin check. Lung cancer screening. You may have this screening every year starting at age 24 if you have a 30-pack-year history of smoking and currently smoke or have quit within the past 15 years. Fecal occult blood test (FOBT) of the stool. You may have this test every year starting at age 10. Flexible sigmoidoscopy or colonoscopy. You may have a sigmoidoscopy every 5 years or a colonoscopy every 10 years starting at age 53. Hepatitis C blood test. Hepatitis B blood test. Sexually transmitted disease (STD) testing. Diabetes screening. This is done by checking your blood sugar (glucose) after you have not eaten for a while (fasting). You may have this done every 1-3 years. Bone density scan. This is done to screen for osteoporosis. You may have this done starting at age 78. Mammogram. This may be done every 1-2 years. Talk to your health care provider about how often you should have regular mammograms. Talk with your health care provider about your test results, treatment options, and if necessary, the need for more tests. Vaccines  Your health care provider may recommend certain vaccines, such as: Influenza vaccine. This is recommended every year. Tetanus, diphtheria, and acellular pertussis (Tdap, Td) vaccine. You may need a Td booster every 10 years. Zoster vaccine. You may need this after age 61. Pneumococcal 13-valent conjugate (PCV13) vaccine. One dose is recommended after age 34. Pneumococcal polysaccharide (PPSV23) vaccine. One dose is recommended after age 42. Talk to your health care provider about which screenings and vaccines you need and how often you need  them. This information is  not intended to replace advice given to you by your health care provider. Make sure you discuss any questions you have with your health care provider. Document Released: 03/22/2015 Document Revised: 11/13/2015 Document Reviewed: 12/25/2014 Elsevier Interactive Patient Education  2017 [employer].  Fall Prevention in the Home Falls can cause injuries. They can happen to people of all ages. There are many things you can do to make your home safe and to help prevent falls. What can I do on the outside of my home? Regularly fix the edges of walkways and driveways and fix any cracks. Remove anything that might make you trip as you walk through a door, such as a raised step or threshold. Trim any bushes or trees on the path to your home. Use bright outdoor lighting. Clear any walking paths of anything that might make someone trip, such as rocks or tools. Regularly check to see if handrails are loose or broken. Make sure that both sides of any steps have handrails. Any raised decks and porches should have guardrails on the edges. Have any leaves, snow, or ice cleared regularly. Use sand or salt on walking paths during winter. Clean up any spills in your garage right away. This includes oil or grease spills. What can I do in the bathroom? Use night lights. Install grab bars by the toilet and in the tub and shower. Do not use towel bars as grab bars. Use non-skid mats or decals in the tub or shower. If you need to sit down in the shower, use a plastic, non-slip stool. Keep the floor dry. Clean up any water that spills on the floor as soon as it happens. Remove soap buildup in the tub or shower regularly. Attach bath mats securely with double-sided non-slip rug tape. Do not have throw rugs and other things on the floor that can make you trip. What can I do in the bedroom? Use night lights. Make sure that you have a light by your bed that is easy to reach. Do not use any sheets or blankets that  are too big for your bed. They should not hang down onto the floor. Have a firm chair that has side arms. You can use this for support while you get dressed. Do not have throw rugs and other things on the floor that can make you trip. What can I do in the kitchen? Clean up any spills right away. Avoid walking on wet floors. Keep items that you use a lot in easy-to-reach places. If you need to reach something above you, use a strong step stool that has a grab bar. Keep electrical cords out of the way. Do not use floor polish or wax that makes floors slippery. If you must use wax, use non-skid floor wax. Do not have throw rugs and other things on the floor that can make you trip. What can I do with my stairs? Do not leave any items on the stairs. Make sure that there are handrails on both sides of the stairs and use them. Fix handrails that are broken or loose. Make sure that handrails are as long as the stairways. Check any carpeting to make sure that it is firmly attached to the stairs. Fix any carpet that is loose or worn. Avoid having throw rugs at the top or bottom of the stairs. If you do have throw rugs, attach them to the floor with carpet tape. Make sure that you have a light switch at  the top of the stairs and the bottom of the stairs. If you do not have them, ask someone to add them for you. What else can I do to help prevent falls? Wear shoes that: Do not have high heels. Have rubber bottoms. Are comfortable and fit you well. Are closed at the toe. Do not wear sandals. If you use a stepladder: Make sure that it is fully opened. Do not climb a closed stepladder. Make sure that both sides of the stepladder are locked into place. Ask someone to hold it for you, if possible. Clearly mark and make sure that you can see: Any grab bars or handrails. First and last steps. Where the edge of each step is. Use tools that help you move around (mobility aids) if they are needed. These  include: Canes. Walkers. Scooters. Crutches. Turn on the lights when you go into a dark area. Replace any light bulbs as soon as they burn out. Set up your furniture so you have a clear path. Avoid moving your furniture around. If any of your floors are uneven, fix them. If there are any pets around you, be aware of where they are. Review your medicines with your doctor. Some medicines can make you feel dizzy. This can increase your chance of falling. Ask your doctor what other things that you can do to help prevent falls. This information is not intended to replace advice given to you by your health care provider. Make sure you discuss any questions you have with your health care provider. Document Released: 12/20/2008 Document Revised: 08/01/2015 Document Reviewed: 03/30/2014 Elsevier Interactive Patient Education  2017 [employer].

## 2023-09-28 NOTE — Progress Notes (Signed)
 Subjective:   Beth [name] is a 73 y.o. female who presents for Medicare Annual (Subsequent) preventive examination.  Visit Complete: Virtual I connected with  Beth [name] on 09/28/23 by a audio enabled telemedicine application and verified that I am speaking with the correct person using two identifiers.  Patient Location: Home  Provider Location: Office/Clinic  I discussed the limitations of evaluation and management by telemedicine. The patient expressed understanding and agreed to proceed.  Vital Signs: Because this visit was a virtual/telehealth visit, some criteria may be missing or patient reported. Any vitals not documented were not able to be obtained and vitals that have been documented are patient reported.  Patient Medicare AWV questionnaire was completed by the patient on 09/28/23; I have confirmed that all information answered by patient is correct and no changes since this date.        Objective:    There were no vitals filed for this visit. There is no height or weight on file to calculate BMI.     05/07/2023   10:26 AM 09/03/2022   11:58 AM 06/20/2022    5:12 PM 02/02/2022   11:28 AM 06/27/2021    8:44 AM 01/29/2021   11:42 AM 07/05/2019    2:42 PM  Advanced Directives  Does Patient Have a Medical Advance Directive? Yes Yes No Yes Yes Yes Yes  Type of Estate agent of [place];Living will Healthcare Power of [place];Living will  Healthcare Power of [place];Living will Healthcare Power of [place];Living will Healthcare Power of [place];Living will Healthcare Power of [place];Living will  Does patient want to make changes to medical advance directive?  No - Patient declined  No - Patient declined  No - Patient declined No - Patient declined  Copy of Healthcare Power of Attorney in Chart?  No - copy requested  No - copy requested  No - copy requested   Would patient like information on creating a medical advance directive?   No - Patient  declined        Current Medications (verified) Outpatient Encounter Medications as of 09/28/2023  Medication Sig   acetaminophen  (TYLENOL ) 500 MG tablet Take 1 tablet (500 mg total) by mouth every 6 (six) hours as needed (pain).   ALPRAZolam  (XANAX ) 0.25 MG tablet Take 1/2 to 1 tablet 2 x / day if needed for anxiety. (Patient taking differently: Take 0.25 mg by mouth 2 (two) times daily.)   B Complex-Biotin-FA (B-100 COMPLEX PO) Take by mouth.   BIOTIN PO Take 1 tablet by mouth daily.   Cholecalciferol (VITAMIN D ) 50 MCG (2000 UT) CAPS Take 2,000 Units by mouth daily.   Clobetasol  Prop Emollient Base (CLOBETASOL  PROPIONATE E) 0.05 % emollient cream Apply 1 Application topically 3 (three) times a week.   Cyanocobalamin (B-12) 100 MCG TABS Take by mouth.   Lecanemab -irmb (LEQEMBI ) 200 MG/2ML SOLN Inject 10 mg/kg into the vein every 14 (fourteen) days.   LINZESS  72 MCG capsule TAKE 1 CAPSULE(72 MCG) BY MOUTH DAILY BEFORE BREAKFAST   Magnesium 500 MG TABS Take 500 mg by mouth daily.   milk thistle 175 MG tablet Take 175 mg by mouth daily.   NYSTATIN  powder APPLY TO AFFECTED AREAS THREE TIMES DAILY   Omega-3 Fatty Acids (FISH OIL OMEGA-3 PO) Take 1 capsule by mouth daily.   oxyCODONE  (OXY IR/ROXICODONE ) 5 MG immediate release tablet Take 1 tablet (5 mg total) by mouth every 4 (four) hours as needed for severe pain (pain score 7-10).  polyethylene glycol powder (GLYCOLAX /MIRALAX ) 17 GM/SCOOP powder Take 17 g by mouth daily. Drink 17g (1 scoop) dissolved in water per day.   rosuvastatin  (CRESTOR ) 10 MG tablet TAKE 1 TABLET BY MOUTH DAILY FOR CHOLESTEROL   TURMERIC PO Take by mouth.   venlafaxine  XR (EFFEXOR -XR) 150 MG 24 hr capsule Take 150 mg by mouth 2 (two) times daily.    vitamin C (ASCORBIC ACID) 500 MG tablet Take 500 mg by mouth daily.   vitamin E 180 MG (400 UNITS) capsule Take 400 Units by mouth daily.   zinc gluconate 50 MG tablet Take 50 mg by mouth daily.   No facility-administered  encounter medications on file as of 09/28/2023.    Allergies (verified) Penicillins, Sulfa antibiotics, Hydrocodone , Celebrex [celecoxib], Ciprofloxacin, Codeine, Estradiol , Levaquin [levofloxacin], and Motrin ib [ibuprofen]   History: Past Medical History:  Diagnosis Date   Abscessed tooth    Alzheimer dementia (HCC)    Anemia    Anxiety    Arthritis    Bell's palsy 01/1985   COPD (chronic obstructive pulmonary disease) (HCC)    Depression    Diabetes mellitus without complication (HCC)    Femur fracture, left (HCC)    Fibroid uterus 12/20/2014   7cm, 3.4cm and 1.9cm fibroids    Gallstones    GERD (gastroesophageal reflux disease)    Hyperlipidemia    Hypertension    IBS (irritable bowel syndrome) 02/1988   Obesity hypoventilation syndrome (HCC) 11/19/2014   Obesity, Class III, BMI 40-49.9 (morbid obesity)    OSA (obstructive sleep apnea)    no cpap   Osteopenia    Rotator cuff tear 2023   with bicep tear   Sleep apnea    resolved 25 years ago   Vitamin D  deficiency    Past Surgical History:  Procedure Laterality Date   BLADDER SUSPENSION  03/2006, 07/2006, 01/2007   for stress incontinence   CHOLECYSTECTOMY N/A 12/08/2018   Procedure: LAPAROSCOPIC CHOLECYSTECTOMY;  Surgeon: Vernetta Berg, MD;  Location: [facility] OR;  Service: General;  Laterality: N/A;   COLONOSCOPY  05/2005   Normal, recheck 10 yrs   COLONOSCOPY WITH PROPOFOL  N/A 06/27/2021   Procedure: COLONOSCOPY WITH PROPOFOL ;  Surgeon: Rollin Dover, MD;  Location: [facility] ENDOSCOPY;  Service: Endoscopy;  Laterality: N/A;   HEMOSTASIS CLIP PLACEMENT  06/27/2021   Procedure: HEMOSTASIS CLIP PLACEMENT;  Surgeon: Rollin Dover, MD;  Location: [facility];  Service: Endoscopy;;   JOINT REPLACEMENT  2012.2014.2020   right knee   LUMBAR FUSION Right 09/2016   L4-5 fusion by Dr. Alm Molt   POLYPECTOMY  06/27/2021   Procedure: POLYPECTOMY;  Surgeon: Rollin Dover, MD;  Location: [facility];  Service: Endoscopy;;    SPINE SURGERY  2018   TONSILLECTOMY AND ADENOIDECTOMY     TOTAL KNEE ARTHROPLASTY Left 2012   x 2 left knee   TOTAL KNEE REVISION  2014   TOTAL KNEE REVISION Left 08/2018   Dr Omar, [facility]   Family History  Problem Relation Age of Onset   Dementia Mother    Arthritis Mother    Depression Mother    Hypertension Father    Diabetes Father    Parkinson's disease Father    Obesity Father    Stroke Father    Heart attack Paternal Grandfather 33       Died MI   Colon cancer Neg Hx    Esophageal cancer Neg Hx    Pancreatic cancer Neg Hx    Stomach cancer Neg Hx  Bladder Cancer Neg Hx    Uterine cancer Neg Hx    Renal cancer Neg Hx    Social History   Socioeconomic History   Marital status: Divorced    Spouse name: Not on file   Number of children: 1   Years of education: Not on file   Highest education level: Bachelor's degree (e.g., BA, AB, BS)  Occupational History    Employer: RETIRED  Tobacco Use   Smoking status: Every Day    Current packs/day: 0.75    Average packs/day: 0.8 packs/day for 52.6 years (39.4 ttl pk-yrs)    Types: Cigarettes    Start date: 03/10/1971    Passive exposure: Current   Smokeless tobacco: Never   Tobacco comments:    1 pack/day from age [age], now down to 0.5  Vaping Use   Vaping status: Never Used  Substance and Sexual Activity   Alcohol use: No   Drug use: No   Sexual activity: Not Currently    Partners: Male    Birth control/protection: Post-menopausal  Other Topics Concern   Not on file  Social History Narrative   Lives alone.     Right handed   Caffeine 1 cup daily    Retired Engineer, civil (consulting)   Social Drivers of Corporate investment banker Strain: Low Risk  (05/16/2023)   Overall Financial Resource Strain (CARDIA)    Difficulty of Paying Living Expenses: Not hard at all  Food Insecurity: No Food Insecurity (05/16/2023)   Hunger Vital Sign    Worried About Running Out of Food in the Last Year: Never true    Ran Out of Food  in the Last Year: Never true  Transportation Needs: No Transportation Needs (05/16/2023)   PRAPARE - Administrator, Civil Service (Medical): No    Lack of Transportation (Non-Medical): No  Physical Activity: Insufficiently Active (05/16/2023)   Exercise Vital Sign    Days of Exercise per Week: 3 days    Minutes of Exercise per Session: 40 min  Stress: No Stress Concern Present (05/16/2023)   [employer] of Occupational Health - Occupational Stress Questionnaire    Feeling of Stress : Only a little  Social Connections: Moderately Isolated (05/16/2023)   Social Connection and Isolation Panel    Frequency of Communication with Friends and Family: More than three times a week    Frequency of Social Gatherings with Friends and Family: Once a week    Attends Religious Services: Never    Database administrator or Organizations: Yes    Attends Banker Meetings: 1 to 4 times per year    Marital Status: Divorced    Tobacco Counseling Ready to quit: Not Answered Counseling given: Not Answered Tobacco comments: 1 pack/day from age [age], now down to 0.5   Clinical Intake:                        Activities of Daily Living    09/25/2023    1:29 PM  In your present state of health, do you have any difficulty performing the following activities:  Hearing? 0  Vision? 0  Difficulty concentrating or making decisions? 0  Walking or climbing stairs? 1  Dressing or bathing? 0  Doing errands, shopping? 0  Preparing Food and eating ? N  Using the Toilet? N  In the past six months, have you accidently leaked urine? Y  Do you have problems with loss of bowel control?  N  Managing your Medications? N  Managing your Finances? N  Housekeeping or managing your Housekeeping? N    Patient Care Team: [place], Quintin PARAS, MD as PCP - General (Family Medicine) Kristie Lamprey, MD as Consulting Physician (Gastroenterology)  Indicate any recent Medical Services you  may have received from other than [facility] providers in the past year (date may be approximate).     Assessment:   This is a routine wellness examination for [facility].  Hearing/Vision screen No results found.   Goals Addressed   None    Depression Screen    05/20/2023    1:06 PM 03/16/2023   11:35 AM 12/27/2022    1:37 PM 09/03/2022   12:04 PM 05/21/2022    4:18 PM 02/02/2022   11:29 AM 01/05/2022   10:12 AM  PHQ 2/9 Scores  PHQ - 2 Score 1 0 0 0 0 1 0  PHQ- 9 Score 3          Fall Risk    09/25/2023    1:29 PM 05/20/2023    1:06 PM 03/16/2023   11:36 AM 12/27/2022    1:37 PM 09/03/2022   12:03 PM  Fall Risk   Falls in the past year? 0 0 1 0 1  Number falls in past yr: 0 0 0  0  Injury with Fall? 0 0 1  1  Risk for fall due to :  Impaired mobility;Other (Comment)  No Fall Risks Mental status change;Impaired balance/gait;History of fall(s)  Risk for fall due to: Comment  walks with a walker     Follow up  Falls evaluation completed   Falls evaluation completed;Falls prevention discussed    MEDICARE RISK AT HOME: Medicare Risk at Home Any stairs in or around the home?: (Patient-Rptd) No If so, are there any without handrails?: (Patient-Rptd) No Home free of loose throw rugs in walkways, pet beds, electrical cords, etc?: (Patient-Rptd) Yes Adequate lighting in your home to reduce risk of falls?: (Patient-Rptd) Yes Life alert?: (Patient-Rptd) No Use of a cane, walker or w/c?: (Patient-Rptd) Yes Grab bars in the bathroom?: (Patient-Rptd) Yes Shower chair or bench in shower?: (Patient-Rptd) Yes Elevated toilet seat or a handicapped toilet?: (Patient-Rptd) Yes  TIMED UP AND GO:  Was the test performed?  No    Cognitive Function:    09/03/2022   12:11 PM  MMSE - Mini Mental State Exam  Orientation to time 5  Orientation to Place 5  Registration 3  Attention/ Calculation 5  Recall 1  Language- name 2 objects 2  Language- repeat 1  Language- follow 3 step command 3   Language- read & follow direction 1  Write a sentence 1  Copy design 1  Total score 28      07/01/2023    1:51 PM 12/31/2022    2:59 PM 07/23/2022    1:00 PM 07/23/2022   12:56 PM  Montreal Cognitive Assessment   Visuospatial/ Executive (0/5) 5 5 5 5   Naming (0/3) 3 3 3 3   Attention: Read list of digits (0/2) 2 1 2 2   Attention: Read list of letters (0/1) 0 0 1 1  Attention: Serial 7 subtraction starting at 100 (0/3) 3 2 3    Language: Repeat phrase (0/2) 2 1 2    Language : Fluency (0/1) 0 0 1   Abstraction (0/2) 2 2 2    Delayed Recall (0/5) 0 3 1   Orientation (0/6) 6 6 6    Total 23 23 26  Adjusted Score (based on education)   26       Immunizations Immunization History  Administered Date(s) Administered   DT (Pediatric) 05/21/2015   Fluad Quad(high Dose 65+) 12/17/2021   Influenza, High Dose Seasonal PF 01/26/2019, 12/08/2019   Influenza,inj,Quad PF,6+ Mos 12/02/2016   Influenza,inj,quad, With Preservative 12/09/2015   Influenza-Unspecified 12/07/2016, 12/11/2020, 12/08/2022   Moderna Sars-Covid-2 Vaccination 04/24/2019, 05/23/2019, 01/17/2020   PPD Test 03/21/2013, 05/21/2015   Pneumococcal Conjugate-13 01/04/2014   Pneumococcal Polysaccharide-23 03/09/2004, 11/11/2017   Td 03/09/2004   Tdap 03/10/2015   Typhoid Inactivated 05/10/2014   Unspecified SARS-COV-2 Vaccination 12/29/2022   Zoster Recombinant(Shingrix) 05/21/2020, 07/21/2020   Zoster, Live 12/22/2012    TDAP status: Up to date  Flu Vaccine status: Up to date  Pneumococcal vaccine status: Up to date  Covid-19 vaccine status: Completed vaccines  Qualifies for Shingles Vaccine? Yes   Zostavax completed Yes   Shingrix Completed?: Yes  Screening Tests Health Maintenance  Topic Date Due   FOOT EXAM  02/03/2023   Lung Cancer Screening  03/07/2023   HEMOGLOBIN A1C  06/24/2023   COVID-19 Vaccine (5 - Moderna risk 2024-25 season) 06/29/2023   INFLUENZA VACCINE  10/08/2023   Diabetic kidney  evaluation - Urine ACR  12/24/2023   Diabetic kidney evaluation - eGFR measurement  05/08/2024   OPHTHALMOLOGY EXAM  08/16/2024   Medicare Annual Wellness (AWV)  09/27/2024   MAMMOGRAM  12/10/2024   DTaP/Tdap/Td (4 - Td or Tdap) 05/20/2025   Colonoscopy  06/28/2026   Pneumococcal Vaccine: 50+ Years  Completed   DEXA SCAN  Completed   Hepatitis C Screening  Completed   Zoster Vaccines- Shingrix  Completed   Hepatitis B Vaccines  Aged Out   HPV VACCINES  Aged Out   Meningococcal B Vaccine  Aged Out    Health Maintenance  Health Maintenance Due  Topic Date Due   FOOT EXAM  02/03/2023   Lung Cancer Screening  03/07/2023   HEMOGLOBIN A1C  06/24/2023   COVID-19 Vaccine (5 - Moderna risk 2024-25 season) 06/29/2023    Colorectal cancer screening: Type of screening: Colonoscopy. Completed 06/27/21. Repeat every 5 years  Mammogram status: Completed 12/11/2022. Repeat every year  Bone Density status: Completed 08/20/21. Results reflect: Bone density results: NORMAL. Repeat every 2 years.  Lung Cancer Screening: (Low Dose CT Chest recommended if Age 82-80 years, 20 pack-year currently smoking OR have quit w/in 15years.) does qualify.   Lung Cancer Screening Referral: ordered placed  Additional Screening:  Hepatitis C Screening: does qualify; Completed   Vision Screening: Recommended annual ophthalmology exams for early detection of glaucoma and other disorders of the eye. Is the patient up to date with their annual eye exam?  Yes  Who is the provider or what is the name of the office in which the patient attends annual eye exams? Dr Hurman goud  If pt is not established with a provider, would they like to be referred to a provider to establish care? Yes .   Dental Screening: Recommended annual dental exams for proper oral hygiene  Diabetic Foot Exam: Diabetic Foot Exam: Overdue, Pt has been advised about the importance in completing this exam. Pt is scheduled for diabetic foot exam  on next visit.  Community Resource Referral / Chronic Care Management: CRR required this visit?  No   CCM required this visit?  No     Plan:     I have personally reviewed and noted the following in the patient's chart:  Medical and social history Use of alcohol, tobacco or illicit drugs  Current medications and supplements including opioid prescriptions. Patient is not currently taking opioid prescriptions. Functional ability and status Nutritional status Physical activity Advanced directives List of other physicians Hospitalizations, surgeries, and ER visits in previous 12 months Vitals Screenings to include cognitive, depression, and falls Referrals and appointments  In addition, I have reviewed and discussed with patient certain preventive protocols, quality metrics, and best practice recommendations. A written personalized care plan for preventive services as well as general preventive health recommendations were provided to patient.     Kerri JONELLE Fuel, CMA   09/28/2023   After Visit Summary: (MyChart) Due to this being a telephonic visit, the after visit summary with patients personalized plan was offered to patient via MyChart   Nurse Notes:  Ms. Spearing , Thank you for taking time to come for your Medicare Wellness Visit. I appreciate your ongoing commitment to your health goals. Please review the following plan we discussed and let me know if I can assist you in the future.   These are the goals we discussed:  Goals      Blood Pressure < 130/80     HEMOGLOBIN A1C < 6.0     LDL CALC < 100     Quit Smoking     Weight (lb) < 200 lb (90.7 kg)        This is a list of the screening recommended for you and due dates:  Health Maintenance  Topic Date Due   Complete foot exam   02/03/2023   Screening for Lung Cancer  03/07/2023   Hemoglobin A1C  06/24/2023   COVID-19 Vaccine (5 - Moderna risk 2024-25 season) 06/29/2023   Flu Shot  10/08/2023   Yearly kidney  health urinalysis for diabetes  12/24/2023   Yearly kidney function blood test for diabetes  05/08/2024   Eye exam for diabetics  08/16/2024   Medicare Annual Wellness Visit  09/27/2024   Mammogram  12/10/2024   DTaP/Tdap/Td vaccine (4 - Td or Tdap) 05/20/2025   Colon Cancer Screening  06/28/2026   Pneumococcal Vaccine for age over 90  Completed   DEXA scan (bone density measurement)  Completed   Hepatitis C Screening  Completed   Zoster (Shingles) Vaccine  Completed   Hepatitis B Vaccine  Aged Out   HPV Vaccine  Aged Out   Meningitis B Vaccine  Aged Out

## 2023-09-28 NOTE — Addendum Note (Signed)
 Addended by: KRYSTAL ANDREE GAILS on: 09/28/2023 02:52 PM   Modules accepted: Orders

## 2023-09-28 NOTE — Progress Notes (Signed)
 [facility]necology Pre-Operative Exam  Subjective Chief Complaint: Beth [name] presents for a preoperative encounter.   History of Present Illness: Beth [name] is a 73 y.o. female who presents for preoperative visit.  She is scheduled to undergo Exam under anesthesia, excision of exposed vaginal mesh, cystoscopy  on 10/11/23.  Her symptoms include vaginal mesh exposure and incomplete bladder emptying.   Urodynamics showed: 1. Sensation was normal; capacity was normal 2. Stress Incontinence was demonstrated at normal pressures; 3. Detrusor Overactivity was demonstrated with leakage. 4. Emptying was dysfunctional with a elevated PVR ( ), a sustained detrusor contraction present (increased pressure),  abdominal straining present, dyssynergic urethral sphincter activity on EMG  Past Medical History:  Diagnosis Date   Abscessed tooth    Alzheimer dementia (HCC)    Anemia    Anxiety    Arthritis    Bell's palsy 01/1985   COPD (chronic obstructive pulmonary disease) (HCC)    Depression    Diabetes mellitus without complication (HCC)    Femur fracture, left (HCC)    Fibroid uterus 12/20/2014   7cm, 3.4cm and 1.9cm fibroids    Gallstones    GERD (gastroesophageal reflux disease)    Hyperlipidemia    Hypertension    IBS (irritable bowel syndrome) 02/1988   Obesity hypoventilation syndrome (HCC) 11/19/2014   Obesity, Class III, BMI 40-49.9 (morbid obesity)    OSA (obstructive sleep apnea)    no cpap   Osteopenia    Rotator cuff tear 2023   with bicep tear   Sleep apnea    resolved 25 years ago   Vitamin D  deficiency      Past Surgical History:  Procedure Laterality Date   BLADDER SUSPENSION  03/2006, 07/2006, 01/2007   for stress incontinence   CHOLECYSTECTOMY N/A 12/08/2018   Procedure: LAPAROSCOPIC CHOLECYSTECTOMY;  Surgeon: Vernetta Berg, MD;  Location: [facility] OR;  Service: General;  Laterality: N/A;   COLONOSCOPY  05/2005   Normal, recheck 10 yrs    COLONOSCOPY WITH PROPOFOL  N/A 06/27/2021   Procedure: COLONOSCOPY WITH PROPOFOL ;  Surgeon: Rollin Dover, MD;  Location: [facility];  Service: Endoscopy;  Laterality: N/A;   HEMOSTASIS CLIP PLACEMENT  06/27/2021   Procedure: HEMOSTASIS CLIP PLACEMENT;  Surgeon: Rollin Dover, MD;  Location: [facility];  Service: Endoscopy;;   JOINT REPLACEMENT  2012.2014.2020   right knee   LUMBAR FUSION Right 09/2016   L4-5 fusion by Dr. Alm Molt   POLYPECTOMY  06/27/2021   Procedure: POLYPECTOMY;  Surgeon: Rollin Dover, MD;  Location: [facility];  Service: Endoscopy;;   SPINE SURGERY  2018   TONSILLECTOMY AND ADENOIDECTOMY     TOTAL KNEE ARTHROPLASTY Left 2012   x 2 left knee   TOTAL KNEE REVISION  2014   TOTAL KNEE REVISION Left 08/2018   Dr Omar, [facility]    is allergic to penicillins, sulfa antibiotics, hydrocodone , celebrex [celecoxib], ciprofloxacin, codeine, estradiol , levaquin [levofloxacin], and motrin ib [ibuprofen].   Family History  Problem Relation Age of Onset   Dementia Mother    Arthritis Mother    Depression Mother    Hypertension Father    Diabetes Father    Parkinson's disease Father    Obesity Father    Stroke Father    Heart attack Paternal Grandfather 68       Died MI   Colon cancer Neg Hx    Esophageal cancer Neg Hx    Pancreatic cancer Neg Hx    Stomach cancer Neg Hx  Bladder Cancer Neg Hx    Uterine cancer Neg Hx    Renal cancer Neg Hx     Social History   Tobacco Use   Smoking status: Every Day    Current packs/day: 0.75    Average packs/day: 0.8 packs/day for 52.6 years (39.4 ttl pk-yrs)    Types: Cigarettes    Start date: 03/10/1971    Passive exposure: Current   Smokeless tobacco: Never   Tobacco comments:    1 pack/day from age [age], now down to 0.5  Vaping Use   Vaping status: Never Used  Substance Use Topics   Alcohol use: No   Drug use: No     Review of Systems was negative for a full 10 system review except as noted  in the History of Present Illness.   Current Outpatient Medications:    acetaminophen  (TYLENOL ) 500 MG tablet, Take 1 tablet (500 mg total) by mouth every 6 (six) hours as needed (pain)., Disp: 30 tablet, Rfl: 0   ALPRAZolam  (XANAX ) 0.25 MG tablet, Take 1/2 to 1 tablet 2 x / day if needed for anxiety. (Patient taking differently: Take 0.25 mg by mouth 2 (two) times daily.), Disp: 60 tablet, Rfl: 0   B Complex-Biotin-FA (B-100 COMPLEX PO), Take by mouth., Disp: , Rfl:    BIOTIN PO, Take 1 tablet by mouth daily., Disp: , Rfl:    Cholecalciferol (VITAMIN D ) 50 MCG (2000 UT) CAPS, Take 2,000 Units by mouth daily., Disp: , Rfl:    Clobetasol  Prop Emollient Base (CLOBETASOL  PROPIONATE E) 0.05 % emollient cream, Apply 1 Application topically 3 (three) times a week., Disp: 30 g, Rfl: 11   Cyanocobalamin (B-12) 100 MCG TABS, Take by mouth., Disp: , Rfl:    Lecanemab -irmb (LEQEMBI ) 200 MG/2ML SOLN, Inject 10 mg/kg into the vein every 14 (fourteen) days., Disp: , Rfl:    LINZESS  72 MCG capsule, TAKE 1 CAPSULE(72 MCG) BY MOUTH DAILY BEFORE BREAKFAST, Disp: 30 capsule, Rfl: 2   Magnesium 500 MG TABS, Take 500 mg by mouth daily., Disp: , Rfl:    milk thistle 175 MG tablet, Take 175 mg by mouth daily., Disp: , Rfl:    NYSTATIN  powder, APPLY TO AFFECTED AREAS THREE TIMES DAILY, Disp: 15 g, Rfl: 0   Omega-3 Fatty Acids (FISH OIL OMEGA-3 PO), Take 1 capsule by mouth daily., Disp: , Rfl:    polyethylene glycol powder (GLYCOLAX /MIRALAX ) 17 GM/SCOOP powder, Take 17 g by mouth daily. Drink 17g (1 scoop) dissolved in water per day., Disp: 255 g, Rfl: 0   rosuvastatin  (CRESTOR ) 10 MG tablet, TAKE 1 TABLET BY MOUTH DAILY FOR CHOLESTEROL, Disp: 90 tablet, Rfl: 3   TURMERIC PO, Take by mouth., Disp: , Rfl:    venlafaxine  XR (EFFEXOR -XR) 150 MG 24 hr capsule, Take 150 mg by mouth 2 (two) times daily. , Disp: , Rfl:    vitamin C (ASCORBIC ACID) 500 MG tablet, Take 500 mg by mouth daily., Disp: , Rfl:    vitamin E 180 MG  (400 UNITS) capsule, Take 400 Units by mouth daily., Disp: , Rfl:    zaleplon (SONATA) 5 MG capsule, Take 5 mg by mouth at bedtime as needed for sleep., Disp: , Rfl:    zinc gluconate 50 MG tablet, Take 50 mg by mouth daily., Disp: , Rfl:    oxyCODONE  (OXY IR/ROXICODONE ) 5 MG immediate release tablet, Take 1 tablet (5 mg total) by mouth every 4 (four) hours as needed for severe pain (pain score 7-10)., Disp:  5 tablet, Rfl: 0   Objective Vitals:   09/28/23 1331  BP: (!) 146/87  Pulse: 94    Gen: NAD CV: S1 S2 RRR Lungs: Clear to auscultation bilaterally Abd: soft, nontender   Previous Pelvic Exam showed: CST: positive, small leak present   Speculum exam reveals normal vaginal mucosa with atrophy. Two small areas of mesh erosion in midline distal urethra (each approx 0.5cm). Cervix normal appearance. Uterus normal single, nontender. Adnexa no mass, fullness, tenderness.      Assessment/ Plan  Assessment: The patient is a 73 y.o. year old scheduled to undergo Exam under anesthesia, excision of exposed vaginal mesh, cystoscopy. . Verbal consent was obtained for these procedures.  Plan: General Surgical Consent: The patient has previously been counseled on alternative treatments, and the decision by the patient and provider was to proceed with the procedure listed above.  For all procedures, there are risks of bleeding, infection, damage to surrounding organs including but not limited to bowel, bladder, blood vessels, ureters and nerves, and need for further surgery if an injury were to occur. These risks are all low with minimally invasive surgery.   There are risks of numbness and weakness at any body site or buttock/rectal pain.  It is possible that baseline pain can be worsened by surgery, either with or without mesh. If surgery is vaginal, there is also a low risk of possible conversion to laparoscopy or open abdominal incision where indicated. Very rare risks include blood  transfusion, blood clot, heart attack, pneumonia, or death.   There is also a risk of short-term postoperative urinary retention with need to use a catheter. About half of patients need to go home from surgery with a catheter, which is then later removed in the office. The risk of long-term need for a catheter is very low. There is also a risk of worsening of overactive bladder.     We discussed consent for blood products. Risks for blood transfusion include allergic reactions, other reactions that can affect different body organs and managed accordingly, transmission of infectious diseases such as HIV or Hepatitis. However, the blood is screened. Patient consents for blood products.  Pre-operative instructions:  She was instructed to not take Aspirin /NSAIDs x 7days prior to surgery. Antibiotic prophylaxis was ordered as indicated.  Catheter use: Patient will go home with foley if needed after post-operative voiding trial.  Post-operative instructions:  She was provided with specific post-operative instructions, including precautions and signs/symptoms for which we would recommend contacting us , in addition to daytime and after-hours contact phone numbers. This was provided on a handout.   Post-operative medications: Previously sent by Dr. Marilynne  Laboratory testing:  We will check labs: As requested by anesthesia   Preoperative clearance:  She does not require surgical clearance.    Post-operative follow-up:  A post-operative appointment will be made for 6 weeks from the date of surgery. If she needs a post-operative nurse visit for a voiding trial, that will be set up after she leaves the hospital.    Patient will call the clinic or use MyChart should anything change or any new issues arise.   Itzelle Gains G Sebron Mcmahill, NP

## 2023-09-29 NOTE — Addendum Note (Signed)
 Addended by: HERMINE LATUS R on: 09/29/2023 07:33 AM   Modules accepted: Orders

## 2023-09-30 ENCOUNTER — Other Ambulatory Visit: Payer: Self-pay | Admitting: Obstetrics and Gynecology

## 2023-09-30 ENCOUNTER — Ambulatory Visit: Payer: Self-pay | Admitting: Obstetrics and Gynecology

## 2023-09-30 ENCOUNTER — Telehealth: Payer: Self-pay | Admitting: *Deleted

## 2023-09-30 LAB — URINE CULTURE: Culture: >=100000 — AB

## 2023-09-30 MED ORDER — FOSFOMYCIN TROMETHAMINE 3 G PO PACK
3.0000 g | PACK | Freq: Once | ORAL | 0 refills | Status: AC
Start: 2023-09-30 — End: 2023-09-30

## 2023-09-30 NOTE — Progress Notes (Signed)
 Called and left message for patient. Have sent in Fosfomycin due to patient's allergies to treat UTI as it is resistant to Macrobid .

## 2023-09-30 NOTE — Telephone Encounter (Signed)
 TC from pt dob verified - pt was calling back KZ had lef there a message about her urine culture.  I reviewed the chart and show that Fosfomycin was sent to her pharmacy.  Pt was advised the results and prescription sent to [facility] [address].  Advised to call back if has symptoms before surgery.  Sotero CMA

## 2023-10-04 ENCOUNTER — Encounter (HOSPITAL_COMMUNITY): Payer: Self-pay | Admitting: Obstetrics and Gynecology

## 2023-10-04 NOTE — Progress Notes (Signed)
 Spoke w/ via phone for pre-op interview--- Beth [name] needs dos---- BMP per anesthesia Danford ordered).        [name] results------ COVID test -----patient states asymptomatic no test needed Arrive at -------0915 NPO after MN NO Solid Food.  Clear liquids from MN until---0815 Pre-Surgery Ensure or G2:  Med rec completed Medications to take morning of surgery ----- Effexor  and Xanax  PRN Diabetic medication -----  GLP1 agonist last dose: GLP1 instructions:  Patient instructed no nail polish to be worn day of surgery Patient instructed to bring photo id and insurance card day of surgery Patient aware to have Driver (ride ) / caregiver    for 24 hours after surgery - Pt lives at [facility], they will transport. Pt to stay in their Skilled nursing for obs 24 hrs after surgery. Patient Special Instructions ----- Pre-Op special Instructions -----  Patient verbalized understanding of instructions that were given at this phone interview. Patient denies chest pain, sob, fever, cough at the interview.

## 2023-10-08 ENCOUNTER — Encounter: Admitting: Obstetrics and Gynecology

## 2023-10-11 ENCOUNTER — Ambulatory Visit (HOSPITAL_COMMUNITY): Admitting: Anesthesiology

## 2023-10-11 ENCOUNTER — Encounter (HOSPITAL_COMMUNITY)
Admission: RE | Disposition: A | Discharge status: Home / Self Care | Payer: Self-pay | Source: Home / Self Care | Attending: Obstetrics and Gynecology

## 2023-10-11 ENCOUNTER — Ambulatory Visit (HOSPITAL_COMMUNITY)
Admission: RE | Admit: 2023-10-11 | Discharge: 2023-10-11 | Disposition: A | Discharge status: Home / Self Care | Attending: Obstetrics and Gynecology | Admitting: Obstetrics and Gynecology

## 2023-10-11 ENCOUNTER — Other Ambulatory Visit: Payer: Self-pay

## 2023-10-11 ENCOUNTER — Encounter (HOSPITAL_COMMUNITY): Payer: Self-pay | Admitting: Obstetrics and Gynecology

## 2023-10-11 ENCOUNTER — Telehealth: Payer: Self-pay | Admitting: Obstetrics and Gynecology

## 2023-10-11 DIAGNOSIS — Z79899 Other long term (current) drug therapy: Secondary | ICD-10-CM | POA: Diagnosis not present

## 2023-10-11 DIAGNOSIS — Z01818 Encounter for other preprocedural examination: Secondary | ICD-10-CM

## 2023-10-11 DIAGNOSIS — F32A Depression, unspecified: Secondary | ICD-10-CM | POA: Insufficient documentation

## 2023-10-11 DIAGNOSIS — I10 Essential (primary) hypertension: Secondary | ICD-10-CM | POA: Insufficient documentation

## 2023-10-11 DIAGNOSIS — F17210 Nicotine dependence, cigarettes, uncomplicated: Secondary | ICD-10-CM | POA: Insufficient documentation

## 2023-10-11 DIAGNOSIS — T83721D Exposure of implanted vaginal mesh and other prosthetic materials into vagina, subsequent encounter: Secondary | ICD-10-CM | POA: Diagnosis not present

## 2023-10-11 DIAGNOSIS — T83721A Exposure of implanted vaginal mesh and other prosthetic materials into vagina, initial encounter: Secondary | ICD-10-CM

## 2023-10-11 DIAGNOSIS — I129 Hypertensive chronic kidney disease with stage 1 through stage 4 chronic kidney disease, or unspecified chronic kidney disease: Secondary | ICD-10-CM

## 2023-10-11 DIAGNOSIS — K219 Gastro-esophageal reflux disease without esophagitis: Secondary | ICD-10-CM | POA: Insufficient documentation

## 2023-10-11 DIAGNOSIS — J449 Chronic obstructive pulmonary disease, unspecified: Secondary | ICD-10-CM | POA: Insufficient documentation

## 2023-10-11 DIAGNOSIS — M1990 Unspecified osteoarthritis, unspecified site: Secondary | ICD-10-CM | POA: Diagnosis not present

## 2023-10-11 DIAGNOSIS — T8389XA Other specified complication of genitourinary prosthetic devices, implants and grafts, initial encounter: Secondary | ICD-10-CM | POA: Insufficient documentation

## 2023-10-11 DIAGNOSIS — F419 Anxiety disorder, unspecified: Secondary | ICD-10-CM | POA: Insufficient documentation

## 2023-10-11 DIAGNOSIS — N393 Stress incontinence (female) (male): Secondary | ICD-10-CM | POA: Insufficient documentation

## 2023-10-11 DIAGNOSIS — R0683 Snoring: Secondary | ICD-10-CM | POA: Insufficient documentation

## 2023-10-11 DIAGNOSIS — E119 Type 2 diabetes mellitus without complications: Secondary | ICD-10-CM | POA: Insufficient documentation

## 2023-10-11 DIAGNOSIS — E1122 Type 2 diabetes mellitus with diabetic chronic kidney disease: Secondary | ICD-10-CM

## 2023-10-11 DIAGNOSIS — G4733 Obstructive sleep apnea (adult) (pediatric): Secondary | ICD-10-CM | POA: Diagnosis not present

## 2023-10-11 DIAGNOSIS — N182 Chronic kidney disease, stage 2 (mild): Secondary | ICD-10-CM | POA: Diagnosis not present

## 2023-10-11 DIAGNOSIS — T83718A Erosion of other implanted mesh and other prosthetic materials to surrounding organ or tissue, initial encounter: Secondary | ICD-10-CM | POA: Diagnosis present

## 2023-10-11 HISTORY — PX: EXCISION OF MESH: SHX6268

## 2023-10-11 LAB — BASIC METABOLIC PANEL WITH GFR
Anion gap: 11 (ref 5–15)
BUN: 12 mg/dL (ref 8–23)
CO2: 24 mmol/L (ref 22–32)
Calcium: 8.8 mg/dL — ABNORMAL LOW (ref 8.9–10.3)
Chloride: 106 mmol/L (ref 98–111)
Creatinine, Ser: 0.66 mg/dL (ref 0.44–1.00)
GFR, Estimated: >60 mL/min (ref >60)
Glucose, Bld: 81 mg/dL (ref 70–99)
Potassium: 3.8 mmol/L (ref 3.5–5.1)
Sodium: 141 mmol/L (ref 135–145)

## 2023-10-11 SURGERY — REMOVAL, MESH, ABDOMEN OR PELVIS
Anesthesia: General

## 2023-10-11 MED ORDER — ONDANSETRON HCL 4 MG/2ML IJ SOLN
INTRAMUSCULAR | Status: AC
  Filled 2023-10-11: qty 2

## 2023-10-11 MED ORDER — PHENAZOPYRIDINE HCL 100 MG PO TABS
ORAL_TABLET | ORAL | Status: AC
  Filled 2023-10-11: qty 2

## 2023-10-11 MED ORDER — GABAPENTIN 300 MG PO CAPS
300.0000 mg | ORAL_CAPSULE | ORAL | Status: AC
  Administered 2023-10-11: 300 mg via ORAL

## 2023-10-11 MED ORDER — GABAPENTIN 300 MG PO CAPS
ORAL_CAPSULE | ORAL | Status: AC
  Filled 2023-10-11: qty 1

## 2023-10-11 MED ORDER — ONDANSETRON HCL 4 MG/2ML IJ SOLN
INTRAMUSCULAR | Status: DC | PRN
  Administered 2023-10-11: 4 mg via INTRAVENOUS

## 2023-10-11 MED ORDER — LIDOCAINE 2% (20 MG/ML) 5 ML SYRINGE
INTRAMUSCULAR | Status: DC | PRN
  Administered 2023-10-11: 60 mg via INTRAVENOUS

## 2023-10-11 MED ORDER — LACTATED RINGERS IV SOLN: INTRAVENOUS | Status: DC

## 2023-10-11 MED ORDER — LIDOCAINE-EPINEPHRINE 1 %-1:100000 IJ SOLN
INTRAMUSCULAR | Status: AC
  Filled 2023-10-11: qty 2

## 2023-10-11 MED ORDER — LIDOCAINE 2% (20 MG/ML) 5 ML SYRINGE
INTRAMUSCULAR | Status: AC
  Filled 2023-10-11: qty 5

## 2023-10-11 MED ORDER — PHENYLEPHRINE 80 MCG/ML (10ML) SYRINGE FOR IV PUSH (FOR BLOOD PRESSURE SUPPORT)
PREFILLED_SYRINGE | INTRAVENOUS | Status: DC | PRN
  Administered 2023-10-11: 80 ug via INTRAVENOUS
  Administered 2023-10-11: 40 ug via INTRAVENOUS
  Administered 2023-10-11 (×2): 80 ug via INTRAVENOUS

## 2023-10-11 MED ORDER — CLINDAMYCIN PHOSPHATE 900 MG/50ML IV SOLN
900.0000 mg | INTRAVENOUS | Status: AC
  Administered 2023-10-11: 900 mg via INTRAVENOUS

## 2023-10-11 MED ORDER — DEXAMETHASONE SODIUM PHOSPHATE 10 MG/ML IJ SOLN
INTRAMUSCULAR | Status: DC | PRN
  Administered 2023-10-11: 5 mg via INTRAVENOUS

## 2023-10-11 MED ORDER — GENTAMICIN SULFATE 40 MG/ML IJ SOLN
5.0000 mg/kg | INTRAVENOUS | Status: AC
  Administered 2023-10-11: 370 mg via INTRAVENOUS
  Filled 2023-10-11: qty 9.25

## 2023-10-11 MED ORDER — CHLORHEXIDINE GLUCONATE 0.12 % MT SOLN
15.0000 mL | Freq: Once | OROMUCOSAL | Status: AC
  Administered 2023-10-11: 15 mL via OROMUCOSAL

## 2023-10-11 MED ORDER — ORAL CARE MOUTH RINSE: 15.0000 mL | Freq: Once | OROMUCOSAL | Status: AC

## 2023-10-11 MED ORDER — ACETAMINOPHEN 500 MG PO TABS
ORAL_TABLET | ORAL | Status: AC
  Filled 2023-10-11: qty 2

## 2023-10-11 MED ORDER — EPHEDRINE SULFATE-NACL 50-0.9 MG/10ML-% IV SOSY
PREFILLED_SYRINGE | INTRAVENOUS | Status: DC | PRN
  Administered 2023-10-11 (×3): 5 mg via INTRAVENOUS

## 2023-10-11 MED ORDER — CHLORHEXIDINE GLUCONATE 0.12 % MT SOLN
OROMUCOSAL | Status: DC
Start: 2023-10-11 — End: 2023-10-11
  Filled 2023-10-11: qty 15

## 2023-10-11 MED ORDER — PHENAZOPYRIDINE HCL 100 MG PO TABS
200.0000 mg | ORAL_TABLET | ORAL | Status: AC
  Administered 2023-10-11: 200 mg via ORAL

## 2023-10-11 MED ORDER — PROPOFOL 10 MG/ML IV BOLUS
INTRAVENOUS | Status: AC
  Filled 2023-10-11: qty 20

## 2023-10-11 MED ORDER — ACETAMINOPHEN 500 MG PO TABS
1000.0000 mg | ORAL_TABLET | ORAL | Status: AC
  Administered 2023-10-11: 1000 mg via ORAL

## 2023-10-11 MED ORDER — FENTANYL CITRATE (PF) 250 MCG/5ML IJ SOLN
INTRAMUSCULAR | Status: AC
  Filled 2023-10-11: qty 5

## 2023-10-11 MED ORDER — DEXAMETHASONE SODIUM PHOSPHATE 10 MG/ML IJ SOLN
INTRAMUSCULAR | Status: AC
Start: 2023-10-11 — End: 2023-10-11
  Filled 2023-10-11: qty 1

## 2023-10-11 MED ORDER — SODIUM CHLORIDE 0.9 % IR SOLN
Status: DC | PRN
  Administered 2023-10-11: 1000 mL via INTRAVESICAL

## 2023-10-11 MED ORDER — ESTRADIOL 0.1 MG/GM VA CREA: TOPICAL_CREAM | VAGINAL | 1 refills | Status: DC

## 2023-10-11 MED ORDER — CLINDAMYCIN PHOSPHATE 900 MG/50ML IV SOLN
INTRAVENOUS | Status: AC
  Filled 2023-10-11: qty 50

## 2023-10-11 MED ORDER — EPHEDRINE 5 MG/ML INJ
INTRAVENOUS | Status: AC
  Filled 2023-10-11: qty 5

## 2023-10-11 MED ORDER — PROPOFOL 10 MG/ML IV BOLUS
INTRAVENOUS | Status: DC | PRN
  Administered 2023-10-11: 130 mg via INTRAVENOUS

## 2023-10-11 MED ORDER — 0.9 % SODIUM CHLORIDE (POUR BTL) OPTIME
TOPICAL | Status: DC | PRN
  Administered 2023-10-11: 1000 mL

## 2023-10-11 MED ORDER — FENTANYL CITRATE (PF) 250 MCG/5ML IJ SOLN
INTRAMUSCULAR | Status: DC | PRN
  Administered 2023-10-11: 50 ug via INTRAVENOUS

## 2023-10-11 MED ORDER — PHENYLEPHRINE 80 MCG/ML (10ML) SYRINGE FOR IV PUSH (FOR BLOOD PRESSURE SUPPORT)
PREFILLED_SYRINGE | INTRAVENOUS | Status: AC
  Filled 2023-10-11: qty 10

## 2023-10-11 SURGICAL SUPPLY — 24 items
BLADE CLIPPER SENSICLIP SURGIC (BLADE) ×2 IMPLANT
BLADE SURG 15 STRL LF DISP TIS (BLADE) ×1 IMPLANT
DERMABOND ADVANCED .7 DNX12 (GAUZE/BANDAGES/DRESSINGS) IMPLANT
GAUZE 4X4 16PLY ~~LOC~~+RFID DBL (SPONGE) IMPLANT
GLOVE BIOGEL PI IND STRL 6.5 (GLOVE) ×1 IMPLANT
GLOVE ECLIPSE 6.0 STRL STRAW (GLOVE) ×1 IMPLANT
GOWN STRL REUS W/ TWL LRG LVL3 (GOWN DISPOSABLE) ×1 IMPLANT
HIBICLENS CHG 4% 4OZ BTL (MISCELLANEOUS) ×1 IMPLANT
HOLDER FOLEY CATH W/STRAP (MISCELLANEOUS) ×1 IMPLANT
NDL HYPO 22X1.5 SAFETY MO (MISCELLANEOUS) ×1 IMPLANT
NEEDLE HYPO 22X1.5 SAFETY MO (MISCELLANEOUS) ×1 IMPLANT
NS IRRIG 1000ML POUR BTL (IV SOLUTION) ×2 IMPLANT
PACK VAGINAL WOMENS (CUSTOM PROCEDURE TRAY) ×1 IMPLANT
PAD OB MATERNITY 11 LF (PERSONAL CARE ITEMS) ×1 IMPLANT
RETRACTOR LONE STAR DISPOSABLE (INSTRUMENTS) ×2 IMPLANT
RETRACTOR STAY HOOK 5MM (MISCELLANEOUS) ×1 IMPLANT
SET IRRIG Y TYPE TUR BLADDER L (SET/KITS/TRAYS/PACK) IMPLANT
SPIKE FLUID TRANSFER (MISCELLANEOUS) ×1 IMPLANT
SUCTION TUBE FRAZIER 10FR DISP (SUCTIONS) ×1 IMPLANT
SURGIFLO W/THROMBIN 8M KIT (HEMOSTASIS) IMPLANT
SUT VIC AB 2-0 SH 27XBRD (SUTURE) ×1 IMPLANT
SYR BULB EAR ULCER 3OZ GRN STR (SYRINGE) ×1 IMPLANT
TOWEL GREEN STERILE FF (TOWEL DISPOSABLE) ×1 IMPLANT
TRAY FOLEY W/BAG SLVR 14FR (SET/KITS/TRAYS/PACK) ×1 IMPLANT

## 2023-10-11 NOTE — Anesthesia Procedure Notes (Addendum)
 Procedure Name: LMA Insertion Date/Time: 10/11/2023 11:56 AM  Performed by: Shlomo Tinnie SAILOR, RNPre-anesthesia Checklist: Patient identified, Emergency Drugs available, Suction available, Patient being monitored and Timeout performed Patient Re-evaluated:Patient Re-evaluated prior to induction Oxygen Delivery Method: Circle system utilized Preoxygenation: Pre-oxygenation with 100% oxygen Induction Type: IV induction Ventilation: Mask ventilation without difficulty LMA: LMA inserted LMA Size: 4.0 Number of attempts: 1 Placement Confirmation: positive ETCO2 and breath sounds checked- equal and bilateral Tube secured with: Tape Dental Injury: Teeth and Oropharynx as per pre-operative assessment  Comments: Placed by [facility]

## 2023-10-11 NOTE — Op Note (Signed)
 Operative Note  Preoperative Diagnosis: vaginal mesh exposure  Postoperative Diagnosis: same  Procedures performed:  Excision of exposed vaginal mesh, cystoscopy  Implants: none  Attending Surgeon: Rosaline Caper, MD  Anesthesia: General LMA  Findings: 1cm suburethral mesh exposure, slightly to the left of midline, with small skin bridge.   Specimens:  ID Type Source Tests Collected by Time Destination  1 : Pelvic Mesh for gross only Tissue PATH Soft tissue SURGICAL PATHOLOGY Caper Rosaline SAILOR, MD 10/11/2023 1226     Estimated blood loss: 10 mL  IV fluids: 500 mL  Urine output: see flowsheet   Complications: none  Procedure in Detail:  After informed consent was obtained, the patient was taken to the operating room where anesthesia was induced and found to be adequate. She was placed in dorsal lithotomy position, taking care to avoid any injury, and prepped and draped in the usual sterile fashion. A cystoscopy was performed with a  70 degree cystoscope. A 360 degree view of the bladder was obtained. There was no injury, lesion or foreign bodied present. Brisk bilateral ureteral efflux was noted. Urethroscopy was performed while removing the scope and no injury or mesh was noted. A foley catheter was placed. A lone star vaginal retractor was placed with four stays. On inspection, there was a centimeter area in the midurethra in the midline, slightly to the left, with mesh exposure. Each side was grasped with allis clamps. Thorek scissors were used to dissect the epithelium from the urethral tissue and mesh. Once the mesh was isolated, it was grasped and the exposed portion was cut, approximately 1 cm.   The foley catheter was removed. A cystourethroscopy was again performed. No injury was noted. The foley was replaced.   The vaginal epithelium was reapproximated with 2-0 vicryl suture in a running fashion. The vagina was irrigated. Good hemostasis was noted. The patient  tolerated the procedure well and was taken to the recovery in stable condition. Needle and sponge count was correct x2.   Rosaline SAILOR Caper, MD

## 2023-10-11 NOTE — Telephone Encounter (Signed)
 Beth [name] underwent Excision of exposed vaginal mesh, cystoscopy on 10/11/23.   She passed her voiding trial.  was backfilled into the bladder Voided  PVR by bladder scan was 6ml.   She was discharged without a catheter. Please call her for a routine post op check. Thanks!  Beth LOISE Caper, MD

## 2023-10-11 NOTE — Transfer of Care (Signed)
 Immediate Anesthesia Transfer of Care Note  Patient: Beth [name]  Procedure(s) Performed: REMOVAL EXPOSED MESH VAGINAL  Patient Location: PACU  Anesthesia Type:General  Level of Consciousness: awake, alert , and oriented  Airway & Oxygen Therapy: Patient Spontanous Breathing and Patient connected to nasal cannula oxygen  Post-op Assessment: Report given to RN, Post -op Vital signs reviewed and stable, Patient moving all extremities X 4, and Patient able to stick tongue midline  Post vital signs: Reviewed and stable  Last Vitals:  Vitals Value Taken Time  BP 154/79   Temp 97.8   Pulse 78   Resp 16   SpO2 98     Last Pain:  Vitals:   10/11/23 0909  TempSrc: Oral  PainSc: 1       Patients Stated Pain Goal: 4 (10/11/23 0909)  Complications: No notable events documented.

## 2023-10-11 NOTE — Anesthesia Preprocedure Evaluation (Addendum)
 Anesthesia Evaluation  Patient identified by MRN, date of birth, ID band Patient awake    Reviewed: Allergy & Precautions, H&P , NPO status , Patient's Chart, lab work & pertinent test results  Airway Mallampati: III  TM Distance: >3 FB Neck ROM: Full    Dental  (+) Teeth Intact, Dental Advisory Given   Pulmonary sleep apnea (no cpap, per pt resolved w/ weight loss) , COPD, Current Smoker 40 pack year history, 4-5cigg/d, no inhalers, last cigg this AM Snores at night, sleep study a long time ago was negative   Pulmonary exam normal breath sounds clear to auscultation       Cardiovascular hypertension (147/81 preop, no home meds), Normal cardiovascular exam Rhythm:Regular Rate:Normal     Neuro/Psych  PSYCHIATRIC DISORDERS Anxiety Depression   Dementia negative neurological ROS     GI/Hepatic Neg liver ROS,GERD  Controlled,,  Endo/Other  diabetes, Well Controlled    Renal/GU negative Renal ROS Bladder dysfunction      Musculoskeletal  (+) Arthritis , Osteoarthritis,    Abdominal   Peds negative pediatric ROS (+)  Hematology negative hematology ROS (+)   Anesthesia Other Findings   Reproductive/Obstetrics negative OB ROS                              Anesthesia Physical Anesthesia Plan  ASA: 3  Anesthesia Plan: General   Post-op Pain Management: Tylenol  PO (pre-op)* and Toradol IV (intra-op)*   Induction: Intravenous  PONV Risk Score and Plan: 2 and Ondansetron , Dexamethasone  and Treatment may vary due to age or medical condition  Airway Management Planned: LMA  Additional Equipment: None  Intra-op Plan:   Post-operative Plan: Extubation in OR  Informed Consent: I have reviewed the patients History and Physical, chart, labs and discussed the procedure including the risks, benefits and alternatives for the proposed anesthesia with the patient or authorized representative who  has indicated his/her understanding and acceptance.     Dental advisory given  Plan Discussed with: CRNA  Anesthesia Plan Comments:          Anesthesia Quick Evaluation

## 2023-10-11 NOTE — Anesthesia Postprocedure Evaluation (Signed)
 Anesthesia Post Note  Patient: Beth [name]  Procedure(s) Performed: REMOVAL EXPOSED MESH VAGINAL     Patient location during evaluation: PACU Anesthesia Type: General Level of consciousness: awake and alert, oriented and patient cooperative Pain management: pain level controlled Vital Signs Assessment: post-procedure vital signs reviewed and stable Respiratory status: spontaneous breathing, nonlabored ventilation and respiratory function stable Cardiovascular status: blood pressure returned to baseline and stable Postop Assessment: no apparent nausea or vomiting Anesthetic complications: no   No notable events documented.  Last Vitals:  Vitals:   10/11/23 0909 10/11/23 1250  BP: (!) 147/81 (!) 154/79  Pulse: 81 72  Resp: 17 16  Temp: 36.7 C 36.6 C  SpO2: 98% 98%    Last Pain:  Vitals:   10/11/23 1250  TempSrc:   PainSc: 0-No pain                 Beth [name]

## 2023-10-11 NOTE — Discharge Instructions (Addendum)
 DO NOT TAKE TYLENOL  UNTIL AFTER 3:45 PM TODAY   POST OPERATIVE INSTRUCTIONS  General Instructions Recovery (not bed rest) will last approximately 2- 6 weeks Walking is encouraged, but refrain from strenuous exercise/ housework/ heavy lifting for 2 weeks. No lifting >10lbs  Nothing in the vagina- NO intercourse, tampons or douching for 6 weeks Bathing:  Do not submerge in water (NO swimming, bath, hot tub, etc) until after your postop visit (6 weeks). You can shower starting the day after surgery.  No driving until you are not taking narcotic pain medicine and until your pain is well enough controlled that you can slam on the breaks or make sudden movements if needed.    Taking your medications Please take your acetaminophen  and ibuprofen on a schedule for the first 48 hours. Take 600mg  ibuprofen, then take 500mg  acetaminophen  3 hours later, then continue to alternate ibuprofen and acetaminophen . That way you are taking each type of medication every 6 hours. Take the prescribed narcotic (oxycodone , tramadol , etc) as needed, with a maximum being every 4 hours.  Take a stool softener daily to keep your stools soft and preventing you from straining. If you have diarrhea, you decrease your stool softener. This is explained more below. We have prescribed you Miralax . One week after surgery, start applying vaginal estrogen cream in the vagina twice a week. Place a pea sized amount  Reasons to Call the Nurse (see last page for phone numbers) Heavy Bleeding (changing your pad every 1-2 hours) Persistent nausea/vomiting Fever (100.4 degrees or more) Incision problems (pus or other fluid coming out, redness, warmth, increased pain)  Things to Expect After Surgery Mild to Moderate pain is normal during the first day or two after surgery. If prescribed, take Ibuprofen or Tylenol  first and use the stronger medicine for "break-through" pain. You can overlap these medicines because they work  differently.   Constipation   To Prevent Constipation:  Eat a well-balanced diet including protein, grains, fresh fruit and vegetables.  Drink plenty of fluids. Walk regularly.  Depending on specific instructions from your physician: take Miralax  daily and additionally you can add a stool softener (colace/ docusate) and fiber supplement. Continue as long as you're on pain medications.   To Treat Constipation:  If you do not have a bowel movement in 2 days after surgery, you can take 2 Tbs of Milk of Magnesia 1-2 times a day until you have a bowel movement. If diarrhea occurs, decrease the amount or stop the laxative. If no results with Milk of Magnesia, you can drink a bottle of magnesium citrate which you can purchase over the counter.  Fatigue:  This is a normal response to surgery and will improve with time.  Plan frequent rest periods throughout the day.  Gas Pain:  This is very common but can also be very painful! Drink warm liquids such as herbal teas, bouillon or soup. Walking will help you pass more gas.  Mylicon or Gas-X can be taken over the counter.  Leaking Urine:  Varying amounts of leakage may occur after surgery.  This should improve with time. Your bladder needs at least 3 months to recover from surgery. If you leak after surgery, be sure to mention this to your doctor at your post-op visit. If you were taking medications for overactive bladder prior to surgery, be sure to restart the medications immediately after surgery.  Incisions: If you have incisions on your abdomen, the skin glue will dissolve on its own over time. It  is ok to gently rinse with soap and water over these incisions but do not scrub.  Catheter Approximately 50% of patients are unable to urinate after surgery and need to go home with a catheter. This allows your bladder to rest so it can return to full function. If you go home with a catheter, the office will call to set up a voiding trial a few days after  surgery. For most patients, by this visit, they are able to urinate on their own. Long term catheter use is rare.   Return to Work  As work demands and recovery times vary widely, it is hard to predict when you will want to return to work. If you have a desk job with no strenuous physical activity, and if you would like to return sooner than generally recommended, discuss this with your provider or call our office.   Post op concerns  For non-emergent issues, please call the Urogynecology Nurse. Please leave a message and someone will contact you within one business day.  You can also send a message through MyChart.   AFTER HOURS (After 5:00 PM and on weekends):  For urgent matters that cannot wait until the next business day. Call our office [phone] and connect to the doctor on call.  Please reserve this for important issues.   **FOR ANY TRUE EMERGENCY ISSUES CALL 911 OR GO TO THE NEAREST EMERGENCY ROOM.** Please inform our office or the doctor on call of any emergency.     APPOINTMENTS: Call [phone]  Post Anesthesia Home Care Instructions  Activity: Get plenty of rest for the remainder of the day. A responsible adult should stay with you for 24 hours following the procedure.  For the next 24 hours, DO NOT: -Drive a car -Advertising copywriter -Drink alcoholic beverages -Take any medication unless instructed by your physician -Make any legal decisions or sign important papers.  Meals: Start with liquid foods such as gelatin or soup. Progress to regular foods as tolerated. Avoid greasy, spicy, heavy foods. If nausea and/or vomiting occur, drink only clear liquids until the nausea and/or vomiting subsides. Call your physician if vomiting continues.  Special Instructions/Symptoms: Your throat may feel dry or sore from the anesthesia or the breathing tube placed in your throat during surgery. If this causes discomfort, gargle with warm salt water. The discomfort should disappear  within 24 hours.  If you had a scopolamine  patch placed behind your ear for the management of post- operative nausea and/or vomiting:  1. The medication in the patch is effective for 72 hours, after which it should be removed.  Wrap patch in a tissue and discard in the trash. Wash hands thoroughly with soap and water. 2. You may remove the patch earlier than 72 hours if you experience unpleasant side effects which may include dry mouth, dizziness or visual disturbances. 3. Avoid touching the patch. Wash your hands with soap and water after contact with the patch.  Call your surgeon if you experience:   1.  Fever over 101.0. 2.  Inability to urinate. 3.  Nausea and/or vomiting. 4.  Extreme swelling or bruising at the surgical site. 5.  Continued bleeding from the incision. 6.  Increased pain, redness or drainage from the incision. 7.  Problems related to your pain medication. 8. Any change in color, movement and/or sensation 9. Any problems and/or concerns

## 2023-10-11 NOTE — Interval H&P Note (Signed)
 History and Physical Interval Note:  10/11/2023 9:54 AM  Beth [name]  has presented today for surgery, with the diagnosis of mesh erosion.  The various methods of treatment have been discussed with the patient and family. After consideration of risks, benefits and other options for treatment, the patient has consented to  Procedure(s) with comments: REMOVAL, MESH, ABDOMEN OR PELVIS (N/A) - CPT code 42704 excision of vaginal mesh; Cystoscopy to be done also as a surgical intervention.  The patient's history has been reviewed, patient examined, no change in status, stable for surgery.  I have reviewed the patient's chart and labs.  Questions were answered to the patient's satisfaction.     Beth [name]

## 2023-10-12 ENCOUNTER — Encounter (HOSPITAL_COMMUNITY): Payer: Self-pay | Admitting: Obstetrics and Gynecology

## 2023-10-12 LAB — SURGICAL PATHOLOGY

## 2023-10-14 NOTE — Telephone Encounter (Signed)
 Patient denies pain at all.  Patient reports she has not taken any pain medication.  Reports minimal spotting, denies heavy bleeding.  Reports she has had a bowel movement.  States she is peeing normally.   States she is doing well and will call with any concerns.

## 2023-10-21 ENCOUNTER — Ambulatory Visit (HOSPITAL_BASED_OUTPATIENT_CLINIC_OR_DEPARTMENT_OTHER)
Admission: RE | Admit: 2023-10-21 | Discharge: 2023-10-21 | Disposition: A | Discharge status: Home / Self Care | Source: Ambulatory Visit | Attending: Family Medicine | Admitting: Family Medicine

## 2023-10-21 DIAGNOSIS — I2510 Atherosclerotic heart disease of native coronary artery without angina pectoris: Secondary | ICD-10-CM | POA: Insufficient documentation

## 2023-10-21 DIAGNOSIS — I700 Atherosclerosis of aorta: Secondary | ICD-10-CM | POA: Insufficient documentation

## 2023-10-21 DIAGNOSIS — Z122 Encounter for screening for malignant neoplasm of respiratory organs: Secondary | ICD-10-CM | POA: Diagnosis not present

## 2023-10-21 DIAGNOSIS — Z0000 Encounter for general adult medical examination without abnormal findings: Secondary | ICD-10-CM | POA: Diagnosis present

## 2023-10-21 DIAGNOSIS — J439 Emphysema, unspecified: Secondary | ICD-10-CM | POA: Insufficient documentation

## 2023-10-21 DIAGNOSIS — F17210 Nicotine dependence, cigarettes, uncomplicated: Secondary | ICD-10-CM | POA: Insufficient documentation

## 2023-11-03 ENCOUNTER — Ambulatory Visit (HOSPITAL_BASED_OUTPATIENT_CLINIC_OR_DEPARTMENT_OTHER): Payer: Self-pay | Admitting: Family Medicine

## 2023-11-03 NOTE — Progress Notes (Signed)
 Hi Kay, Your CT lung cancer screening showed benign appearance and behavior of lung nodules.  They recommend continuing with low-dose chest CT screenings in 12 months.  There was atherosclerosis such as plaque buildup present.  Continue a heart healthy diet and your Crestor  to help to prevent further progression of this.  If you have any further questions please let me know

## 2023-11-19 ENCOUNTER — Encounter: Payer: Self-pay | Admitting: Neurology

## 2023-11-23 ENCOUNTER — Ambulatory Visit (INDEPENDENT_AMBULATORY_CARE_PROVIDER_SITE_OTHER): Admitting: Obstetrics and Gynecology

## 2023-11-23 ENCOUNTER — Encounter: Payer: Self-pay | Admitting: Obstetrics and Gynecology

## 2023-11-23 VITALS — BP 147/89 | HR 78

## 2023-11-23 DIAGNOSIS — Z9889 Other specified postprocedural states: Secondary | ICD-10-CM

## 2023-11-23 DIAGNOSIS — N3281 Overactive bladder: Secondary | ICD-10-CM | POA: Diagnosis not present

## 2023-11-23 DIAGNOSIS — N393 Stress incontinence (female) (male): Secondary | ICD-10-CM

## 2023-11-23 DIAGNOSIS — R159 Full incontinence of feces: Secondary | ICD-10-CM | POA: Diagnosis not present

## 2023-11-23 DIAGNOSIS — Z48816 Encounter for surgical aftercare following surgery on the genitourinary system: Secondary | ICD-10-CM | POA: Diagnosis not present

## 2023-11-23 NOTE — Progress Notes (Signed)
 [facility]  Date of Visit: 11/23/2023  History of Present Illness: Ms. Andreason is a 73 y.o. female scheduled today for a post-operative visit.   Surgery: s/p Excision of exposed vaginal mesh, cystoscopy on 10/11/23  She passed her postoperative void trial.   Postoperative course has been uncomplicated.   Today she reports Some nights she goes through 3-4 diapers. During the day, not leaking very much and overall less urgency but still wears a diaper. Mostly has leakage with urgency. Very little leakage with cough/ sneeze. Overall she feels she is emptying her bladder a lot more. No longer using vaginal estrogen because it caused burning.   UTI in the last 6 weeks? No  Pain? No  She has returned to her normal activity (except for postop restrictions) Vaginal bulge? N/a Stress incontinence: Yes  Urgency/frequency: Yes  Urge incontinence: Yes  Voiding dysfunction: No  Bowel issues: bowel incontinence daily, sometimes depends on what she is eating, notices more leakage with fruit   Medications: She has a current medication list which includes the following prescription(s): acetaminophen , alprazolam , b complex-biotin-fa, biotin, vitamin d , b-12, leqembi , linzess , magnesium, milk thistle, nystatin , omega-3 fatty acids, polyethylene glycol powder, rosuvastatin , turmeric, venlafaxine  xr, ascorbic acid, vitamin e, zaleplon, and zinc gluconate.   Allergies: Patient is allergic to penicillins, sulfa antibiotics, hydrocodone , celebrex [celecoxib], ciprofloxacin, codeine, estradiol , levaquin [levofloxacin], and motrin ib [ibuprofen].   Physical Exam: BP (!) 147/89 (BP Location: Left Arm, Patient Position: Sitting, Cuff Size: Normal)   Pulse 78   LMP 04/09/2009    Pelvic Examination: Vagina: Incisions healing well. Sutures are not present at incision line and there is not granulation tissue.  No visible or palpable  mesh.   ---------------------------------------------------------  Assessment and Plan:  1. Post-operative state   2. Incontinence of feces, unspecified fecal incontinence type   3. Overactive bladder   4. SUI (stress urinary incontinence, female)     - Well healed. Can resume regular activity. - For OAB symptoms, she has previously done intravesical botox which has worked with varying results. - We discussed the role of sacral neuromodulation and how it works. It can improve both bladder and bowel incontinence.  It requires a test phase, and documentation of bladder/ bowel function via diary. After a successful test period, a permanent wire and generator are placed in the OR. The battery lasts 10-15 years on average and would need to be replaced surgically.  The goal of this therapy is at least a 50% improvement in symptoms. It is NOT realistic to expect a 100% cure.  - She is interested in the sacral nerve stimulator. For all procedures, we discussed risks of bleeding, infection, damage to surrounding organs including bowel, bladder, blood vessels, ureters and nerves, need for further surgery, risk of postoperative urinary incontinence or retention with need to catheterize, recurrent prolapse, numbness and weakness at any body site, buttock pain, and the rarer risks of blood clot, heart attack, pneumonia, death.   - Bladder diary and information given for [employer] [facility] and will request for PNE in office.    All questions answered.   Rosaline LOISE Caper, MD

## 2023-12-03 ENCOUNTER — Telehealth: Payer: Self-pay

## 2023-12-03 ENCOUNTER — Ambulatory Visit (INDEPENDENT_AMBULATORY_CARE_PROVIDER_SITE_OTHER)

## 2023-12-03 ENCOUNTER — Other Ambulatory Visit (HOSPITAL_COMMUNITY)
Admission: RE | Admit: 2023-12-03 | Discharge: 2023-12-03 | Disposition: A | Discharge status: Home / Self Care | Source: Other Acute Inpatient Hospital | Attending: Obstetrics and Gynecology | Admitting: Obstetrics and Gynecology

## 2023-12-03 VITALS — BP 148/71 | HR 87

## 2023-12-03 DIAGNOSIS — R82998 Other abnormal findings in urine: Secondary | ICD-10-CM | POA: Insufficient documentation

## 2023-12-03 DIAGNOSIS — R350 Frequency of micturition: Secondary | ICD-10-CM

## 2023-12-03 LAB — POCT URINALYSIS DIP (CLINITEK)
Bilirubin, UA: NEGATIVE
Glucose, UA: NEGATIVE mg/dL
Ketones, POC UA: NEGATIVE mg/dL
Nitrite, UA: NEGATIVE
POC PROTEIN,UA: NEGATIVE
Spec Grav, UA: 1.01 (ref 1.010–1.025)
Urobilinogen, UA: 0.2 U/dL
pH, UA: 6 (ref 5.0–8.0)

## 2023-12-03 NOTE — Progress Notes (Signed)
 Beth [name] arrived today with cloudy urine and dysuria. Patient is notexperiencing fever, unstable vitals and/or one-sided back flank pain. Patient has not had had a recent hospitalization due to UTI.  Last visit in the office was 12/03/2023.  Per protocol:   The most recent Urinalysis completed on 09/28/2023 and was notnormal.  Last Creatinine level  Lab Results  Component Value Date   CREATININE 0.66 10/11/2023    An urine specimen was collected and POCT urinalysis completed. [] A cath specimen was collected due to patient's current condition, symptoms or post-procedural state.  Total urine output by catheter is  Output by Drain (mL) 12/01/23 0701 - 12/01/23 1900 12/01/23 1901 - 12/02/23 0700 12/02/23 0701 - 12/02/23 1900 12/02/23 1901 - 12/03/23 0700 12/03/23 0701 - 12/03/23 1316  Patient has no LDAs of requested type attached.    SABRA    POCT Urine results is normal.  Urine micro was not sent per protocol for abnormal urinalysis.  Urine culture was sent per protocol for abnormal urinalysis.     [] Pt was notified of positive urine results and plan for additional urine testing. We will contact you within the next 3-4 days with these results.  [x] No Prescription was sent to your pharmacy.  The additional testing will indicate if a prescription is needed.   [] Patient was notified of abnormal urine results. The following prescription is sent to your preferred pharmacy.  []  Macrobid  100mg  #10 1 tablet by mouth twice daily with food for 5 days      []  Bactrim DS 800-160mg  #6 1 tablet by mouth twice daily for 3 days        []  Due to your current medication allergies, an alternate prescription was discussed with your provider and will be prescribed and sent to your pharmacy.  [] You can take over the counter AZO two tablets up to three times a day for two days.  Take AZO tablets with a full glass of water. AZO will turn your urine orange, this is normal.   [] The patient was notified of negative urine  results.  If symptoms persist, you may take over the counter AZO two tablets up to three times a day for two days.  AZO will turn your urine orange, this is normal.  Contact the office back to schedule an appointment if your symptoms persist or worsen or you develop additional symptoms.       CC'd note to patient's provider.

## 2023-12-03 NOTE — Telephone Encounter (Signed)
 Patient called stating she want to proceed with PNE and would like to get the PA for approval started.

## 2023-12-04 LAB — URINE CULTURE

## 2023-12-10 ENCOUNTER — Encounter: Payer: Self-pay | Admitting: Obstetrics and Gynecology

## 2023-12-10 NOTE — Patient Instructions (Signed)
Your Urine dip that was done in office was Positive. I am sending the urine off for culture and you can take AZO over the counter for your discomfort.  We will contact you when the results are back between 3-5 days. If a antibiotic is needed we will sent the order to the pharmacy and you will be notified. If you have any questions or concerns please feel free to call us at [phone]

## 2023-12-13 ENCOUNTER — Other Ambulatory Visit (HOSPITAL_BASED_OUTPATIENT_CLINIC_OR_DEPARTMENT_OTHER)

## 2023-12-13 ENCOUNTER — Encounter (HOSPITAL_BASED_OUTPATIENT_CLINIC_OR_DEPARTMENT_OTHER): Payer: Self-pay

## 2023-12-13 ENCOUNTER — Other Ambulatory Visit (HOSPITAL_BASED_OUTPATIENT_CLINIC_OR_DEPARTMENT_OTHER): Payer: Self-pay | Admitting: *Deleted

## 2023-12-13 ENCOUNTER — Encounter: Payer: Self-pay | Admitting: Neurology

## 2023-12-13 DIAGNOSIS — Z0000 Encounter for general adult medical examination without abnormal findings: Secondary | ICD-10-CM

## 2023-12-13 DIAGNOSIS — I10 Essential (primary) hypertension: Secondary | ICD-10-CM

## 2023-12-13 DIAGNOSIS — E1169 Type 2 diabetes mellitus with other specified complication: Secondary | ICD-10-CM

## 2023-12-13 DIAGNOSIS — E119 Type 2 diabetes mellitus without complications: Secondary | ICD-10-CM

## 2023-12-13 DIAGNOSIS — E559 Vitamin D deficiency, unspecified: Secondary | ICD-10-CM

## 2023-12-13 DIAGNOSIS — K760 Fatty (change of) liver, not elsewhere classified: Secondary | ICD-10-CM

## 2023-12-14 ENCOUNTER — Ambulatory Visit: Admitting: Neurology

## 2023-12-14 ENCOUNTER — Encounter: Payer: Self-pay | Admitting: Neurology

## 2023-12-14 ENCOUNTER — Ambulatory Visit (HOSPITAL_BASED_OUTPATIENT_CLINIC_OR_DEPARTMENT_OTHER): Payer: Self-pay | Admitting: Family Medicine

## 2023-12-14 VITALS — BP 157/87 | HR 77 | Resp 16 | Ht 66.0 in

## 2023-12-14 DIAGNOSIS — G309 Alzheimer's disease, unspecified: Secondary | ICD-10-CM

## 2023-12-14 DIAGNOSIS — G3184 Mild cognitive impairment, so stated: Secondary | ICD-10-CM | POA: Diagnosis not present

## 2023-12-14 LAB — LIPID PANEL
Chol/HDL Ratio: 2.8 ratio (ref 0.0–4.4)
Cholesterol, Total: 159 mg/dL (ref 100–199)
HDL: 57 mg/dL (ref >39)
LDL Chol Calc (NIH): 91 mg/dL (ref 0–99)
Triglycerides: 56 mg/dL (ref 0–149)
VLDL Cholesterol Cal: 11 mg/dL (ref 5–40)

## 2023-12-14 LAB — HEMOGLOBIN A1C
Est. average glucose Bld gHb Est-mCnc: 111 mg/dL
Hgb A1c MFr Bld: 5.5 % (ref 4.8–5.6)

## 2023-12-14 LAB — COMPREHENSIVE METABOLIC PANEL WITH GFR
ALT: 23 IU/L (ref 0–32)
AST: 28 IU/L (ref 0–40)
Albumin: 3.7 g/dL — ABNORMAL LOW (ref 3.8–4.8)
Alkaline Phosphatase: 184 IU/L — ABNORMAL HIGH (ref 49–135)
BUN/Creatinine Ratio: 26 (ref 12–28)
BUN: 19 mg/dL (ref 8–27)
Bilirubin Total: 0.4 mg/dL (ref 0.0–1.2)
CO2: 26 mmol/L (ref 20–29)
Calcium: 8.9 mg/dL (ref 8.7–10.3)
Chloride: 102 mmol/L (ref 96–106)
Creatinine, Ser: 0.74 mg/dL (ref 0.57–1.00)
Globulin, Total: 2.3 g/dL (ref 1.5–4.5)
Glucose: 82 mg/dL (ref 70–99)
Potassium: 4.4 mmol/L (ref 3.5–5.2)
Sodium: 140 mmol/L (ref 134–144)
Total Protein: 6 g/dL (ref 6.0–8.5)
eGFR: 86 mL/min/1.73 (ref >59)

## 2023-12-14 LAB — MICROALBUMIN / CREATININE URINE RATIO
Creatinine, Urine: 63.1 mg/dL
Microalb/Creat Ratio: 6 mg/g{creat} (ref 0–29)
Microalbumin, Urine: 4 ug/mL

## 2023-12-14 LAB — VITAMIN D 25 HYDROXY (VIT D DEFICIENCY, FRACTURES): Vit D, 25-Hydroxy: 59.9 ng/mL (ref 30.0–100.0)

## 2023-12-14 LAB — CBC WITH DIFFERENTIAL/PLATELET
Basophils Absolute: 0 x10E3/uL (ref 0.0–0.2)
Basos: 1 %
EOS (ABSOLUTE): 0.3 x10E3/uL (ref 0.0–0.4)
Eos: 6 %
Hematocrit: 40.3 % (ref 34.0–46.6)
Hemoglobin: 12.8 g/dL (ref 11.1–15.9)
Immature Grans (Abs): 0 x10E3/uL (ref 0.0–0.1)
Immature Granulocytes: 0 %
Lymphocytes Absolute: 1.3 x10E3/uL (ref 0.7–3.1)
Lymphs: 24 %
MCH: 30.3 pg (ref 26.6–33.0)
MCHC: 31.8 g/dL (ref 31.5–35.7)
MCV: 95 fL (ref 79–97)
Monocytes Absolute: 0.5 x10E3/uL (ref 0.1–0.9)
Monocytes: 9 %
Neutrophils Absolute: 3.3 x10E3/uL (ref 1.4–7.0)
Neutrophils: 60 %
Platelets: 163 x10E3/uL (ref 150–450)
RBC: 4.23 x10E6/uL (ref 3.77–5.28)
RDW: 13 % (ref 11.7–15.4)
WBC: 5.3 x10E3/uL (ref 3.4–10.8)

## 2023-12-14 LAB — TSH RFX ON ABNORMAL TO FREE T4: TSH: 3.27 u[IU]/mL (ref 0.450–4.500)

## 2023-12-14 NOTE — Progress Notes (Signed)
 [facility]  PATIENT: Beth [name] DOB: May 27, 1950  REQUESTING CLINICIAN: Tonita Fallow, MD HISTORY FROM: Patient  REASON FOR VISIT: Memory loss    HISTORICAL  CHIEF COMPLAINT:  Chief Complaint  Patient presents with   Memory Loss    Rm13, sister present, Memory loss moca score was    INTERVAL HISTORY 12/14/2023 Patient presents today for follow-up, she is accompanied by her sister.  Last visit was in April, since then she has moved to a friend[facility] home, independent living facility, she did have cataract surgery and bladder surgery due to incontinence.  [facility] likes being at [facility], more social activities and she is looking forward to start physical therapy. When it comes to her infusion, denies any side effect to infusion, feels like her memory is stable, she does write things down in order to remember.  No other complaints, no other concerns.    INTERVAL HISTORY 07/01/2023 Patient presents today for follow-up, she is accompanied by her friend Beth [name]. Last visit was in October. Since then, she has been tolerated the lecanemab  infusion well, denies any side effect from the medication, completed 11 infusions. Her surveillance MRI have been negative, she tells me that she is doing well.  She still live alone and she is planning to move to  [facility], an independent living facility, denies any worsening of her memory, denies any fall, no other questions, or concerns.    INTERVAL HISTORY 12/15/2022 Patient presents today for follow-up, last visit was in May, at that time we obtained a ATN profile which was positive for Alzheimer disease biomarker, she did have a amyloid PET scan which was positive, showed moderate to frequent amyloid plaque.  She is interested in the new medication such as Leqembi  and wanted to get more information.   HISTORY OF PRESENT ILLNESS:  This is a 73 year old woman with past medical history of anxiety, depression, hyperlipidemia who is  presenting per her daughter request for memory problem.  Patient feels like her memory is fine, she is forgetful but independent in all activities of daily living, she has trouble with names but otherwise she is fine.  She reports that her daughter has complained about her memory, she said that patient asked the same questions, about the same thing and at one point she even thought her mother needed to be moved to a nursing home which made patient very upset.  Patient reports that she lives alone, she cooks very little and use a lot of microwave food.  She does some cleaning, has a housekeeper and also does online shopping.  She does not have any issue with self-care.  She manages her bills online.  She drives, denies any accident in any denies being lost in familiar places.   TBI:   No past history of TBI Stroke:   no past history of stroke Seizures:   no past history of seizures Sleep:  Previous history but improved after weight loss. Mood: Yes, both anxiety and depression Family history of Dementia: Both parents with dementia but at a later age (in their 15)  Functional status: independent in all ADLs and IADLs Patient lives alone. Cooking: very little cooking, microwave Cleaning: some, has a Water quality scientist: yes, online shopping  Bathing: no issues  Toileting: no issues  Driving: yes, no recent accident Bills: Online payment   Ever left the stove on by accident?: Denies  Forget how to use items around the house?: Denies  Getting lost going to familiar places?:  Denies  Forgetting loved ones names?: Denies  Word finding difficulty? Yes Sleep: Good    OTHER MEDICAL CONDITIONS: Anxiety/Depression, hyperlipidemia,   REVIEW OF SYSTEMS: Full 14 system review of systems performed and negative with exception of: As noted in the HPI   ALLERGIES: Allergies  Allergen Reactions   Penicillins Anaphylaxis and Rash    Anaphylaxis as a child  Other Reaction(s): Unknown   Sulfa  Antibiotics Shortness Of Breath and Rash    Also light headed   Hydrocodone  Nausea And Vomiting   Celebrex [Celecoxib] Other (See Comments)    Elevates Blood Pressure    Ciprofloxacin Nausea And Vomiting   Codeine Nausea And Vomiting   Estradiol  Rash   Levaquin [Levofloxacin] Nausea And Vomiting   Motrin Ib [Ibuprofen] Hives    HOME MEDICATIONS: Outpatient Medications Prior to Visit  Medication Sig Dispense Refill   acetaminophen  (TYLENOL ) 500 MG tablet Take 1 tablet (500 mg total) by mouth every 6 (six) hours as needed (pain). 30 tablet 0   ALPRAZolam  (XANAX ) 0.25 MG tablet Take 1/2 to 1 tablet 2 x / day if needed for anxiety. (Patient taking differently: Take 0.25 mg by mouth 2 (two) times daily.) 60 tablet 0   B Complex-Biotin-FA (B-100 COMPLEX PO) Take by mouth.     BIOTIN PO Take 1 tablet by mouth daily.     Cholecalciferol (VITAMIN D ) 50 MCG (2000 UT) CAPS Take 2,000 Units by mouth daily.     Cyanocobalamin (B-12) 100 MCG TABS Take by mouth.     Lecanemab -irmb (LEQEMBI ) 200 MG/2ML SOLN Inject 10 mg/kg into the vein every 14 (fourteen) days.     LINZESS  72 MCG capsule TAKE 1 CAPSULE(72 MCG) BY MOUTH DAILY BEFORE BREAKFAST 30 capsule 2   Magnesium 500 MG TABS Take 500 mg by mouth daily.     milk thistle 175 MG tablet Take 175 mg by mouth daily.     NYSTATIN  powder APPLY TO AFFECTED AREAS THREE TIMES DAILY 15 g 0   Omega-3 Fatty Acids (FISH OIL OMEGA-3 PO) Take 1 capsule by mouth daily.     polyethylene glycol powder (GLYCOLAX /MIRALAX ) 17 GM/SCOOP powder Take 17 g by mouth daily. Drink 17g (1 scoop) dissolved in water per day. 255 g 0   rosuvastatin  (CRESTOR ) 10 MG tablet TAKE 1 TABLET BY MOUTH DAILY FOR CHOLESTEROL 90 tablet 3   TURMERIC PO Take by mouth.     venlafaxine  XR (EFFEXOR -XR) 150 MG 24 hr capsule Take 150 mg by mouth 2 (two) times daily.      vitamin C (ASCORBIC ACID) 500 MG tablet Take 500 mg by mouth daily.     vitamin E 180 MG (400 UNITS) capsule Take 400 Units by  mouth daily.     zaleplon (SONATA) 5 MG capsule Take 5 mg by mouth at bedtime as needed for sleep.     zinc gluconate 50 MG tablet Take 50 mg by mouth daily.     No facility-administered medications prior to visit.    PAST MEDICAL HISTORY: Past Medical History:  Diagnosis Date   Abscessed tooth    Alzheimer dementia (HCC)    Anemia    Anxiety    Arthritis    Bell's palsy 01/1985   COPD (chronic obstructive pulmonary disease) (HCC)    Depression    Diabetes mellitus without complication (HCC)    history of prior to weightloss, no meds since   Femur fracture, left (HCC)    Fibroid uterus 12/20/2014   7cm, 3.4cm  and 1.9cm fibroids    Gallstones    GERD (gastroesophageal reflux disease)    Hyperlipidemia    Hypertension    IBS (irritable bowel syndrome) 02/1988   Obesity hypoventilation syndrome (HCC) 11/19/2014   Obesity, Class III, BMI 40-49.9 (morbid obesity) (HCC)    OSA (obstructive sleep apnea)    no cpap   Osteopenia    Rotator cuff tear 2023   with bicep tear   Sleep apnea    resolved 25 years ago   Vitamin D  deficiency     PAST SURGICAL HISTORY: Past Surgical History:  Procedure Laterality Date   BLADDER SUSPENSION  03/2006, 07/2006, 01/2007   for stress incontinence   CHOLECYSTECTOMY N/A 12/08/2018   Procedure: LAPAROSCOPIC CHOLECYSTECTOMY;  Surgeon: Vernetta Berg, MD;  Location[facility];  Service: General;  Laterality: N/A;   COLONOSCOPY  05/2005   Normal, recheck 10 yrs   COLONOSCOPY WITH PROPOFOL  N/A 06/27/2021   Procedure: COLONOSCOPY WITH PROPOFOL ;  Surgeon: Rollin Dover, MD;  Location: [facility];  Service: Endoscopy;  Laterality: N/A;   EXCISION OF MESH N/A 10/11/2023   Procedure: REMOVAL EXPOSED MESH VAGINAL;  Surgeon: Marilynne Rosaline SAILOR, MD;  [facility];  Service: Gynecology;  Laterality: N/A;  CPT code 42704 excision of vaginal mesh; Cystoscopy to be done also   HEMOSTASIS CLIP PLACEMENT  06/27/2021   Procedure: HEMOSTASIS CLIP PLACEMENT;   Surgeon: Rollin Dover, MD;  Location: [facility];  Service: Endoscopy;;   JOINT REPLACEMENT  2012.2014.2020   left knee   LUMBAR FUSION Right 09/2016   L4-5 fusion by Dr. Alm Molt   POLYPECTOMY  06/27/2021   Procedure: POLYPECTOMY;  Surgeon: Rollin Dover, MD;  Location: [facility];  Service: Endoscopy;;   SPINE SURGERY  2018   TONSILLECTOMY AND ADENOIDECTOMY     TOTAL KNEE ARTHROPLASTY Left 2012   x 2 left knee   TOTAL KNEE REVISION  2014   TOTAL KNEE REVISION Left 08/2018   Dr Omar, [facility]    FAMILY HISTORY: Family History  Problem Relation Age of Onset   Dementia Mother    Arthritis Mother    Depression Mother    Hypertension Father    Diabetes Father    Parkinson's disease Father    Obesity Father    Stroke Father    Heart attack Paternal Grandfather 66       Died MI   Colon cancer Neg Hx    Esophageal cancer Neg Hx    Pancreatic cancer Neg Hx    Stomach cancer Neg Hx    Bladder Cancer Neg Hx    Uterine cancer Neg Hx    Renal cancer Neg Hx     SOCIAL HISTORY: Social History   Socioeconomic History   Marital status: Divorced    Spouse name: Not on file   Number of children: 1   Years of education: Not on file   Highest education level: Bachelor's degree (e.g., BA, AB, BS)  Occupational History    Employer: RETIRED  Tobacco Use   Smoking status: Every Day    Current packs/day: 0.75    Average packs/day: 0.8 packs/day for 52.8 years (39.6 ttl pk-yrs)    Types: Cigarettes    Start date: 03/10/1971    Passive exposure: Current   Smokeless tobacco: Never   Tobacco comments:    1 pack/day from age [age], now down to 0.5  Vaping Use   Vaping status: Never Used  Substance and Sexual Activity   Alcohol use:  No   Drug use: No   Sexual activity: Not Currently    Partners: Male    Birth control/protection: Post-menopausal  Other Topics Concern   Not on file  Social History Narrative   [facility] to [facility].   Right handed    Caffeine 1 cup daily    Retired Engineer, civil (consulting)   Social Drivers of Corporate investment banker Strain: Low Risk  (09/28/2023)   Overall Financial Resource Strain (CARDIA)    Difficulty of Paying Living Expenses: Not hard at all  Food Insecurity: No Food Insecurity (09/28/2023)   Hunger Vital Sign    Worried About Running Out of Food in the Last Year: Never true    Ran Out of Food in the Last Year: Never true  Transportation Needs: No Transportation Needs (09/28/2023)   PRAPARE - Administrator, Civil Service (Medical): No    Lack of Transportation (Non-Medical): No  Physical Activity: Insufficiently Active (09/28/2023)   Exercise Vital Sign    Days of Exercise per Week: 3 days    Minutes of Exercise per Session: 40 min  Stress: No Stress Concern Present (09/28/2023)   [employer] of Occupational Health - Occupational Stress Questionnaire    Feeling of Stress: Not at all  Social Connections: Moderately Isolated (09/28/2023)   Social Connection and Isolation Panel    Frequency of Communication with Friends and Family: More than three times a week    Frequency of Social Gatherings with Friends and Family: Once a week    Attends Religious Services: Never    Database administrator or Organizations: Yes    Attends Engineer, structural: More than 4 times per year    Marital Status: Divorced  Intimate Partner Violence: Not At Risk (09/28/2023)   Humiliation, Afraid, Rape, and Kick questionnaire    Fear of Current or Ex-Partner: No    Emotionally Abused: No    Physically Abused: No    Sexually Abused: No    PHYSICAL EXAM  GENERAL EXAM/CONSTITUTIONAL: Vitals:  Vitals:   12/14/23 1127 12/14/23 1140  BP: (!) 150/90 (!) 157/87  Pulse: 77   Resp: 16   SpO2: 97%   Height: 5' 6 (1.676 m)      Body mass index is 26.31 kg/m. Wt Readings from Last 3 Encounters:  10/11/23 163 lb (73.9 kg)  09/28/23 162 lb 9.6 oz (73.8 kg)  08/18/23 167 lb (75.8 kg)   Patient is in  no distress; well developed, nourished and groomed; neck is supple  MUSCULOSKELETAL: Gait, strength, tone, movements noted in Neurologic exam below  NEUROLOGIC: MENTAL STATUS:     09/28/2023    3:07 PM 09/03/2022   12:11 PM  MMSE - Mini Mental State Exam  Not completed: Unable to complete   Orientation to time  5  Orientation to Place  5  Registration  3  Attention/ Calculation  5  Recall  1  Language- name 2 objects  2  Language- repeat  1  Language- follow 3 step command  3  Language- read & follow direction  1  Write a sentence  1  Copy design  1  Total score  28      12/14/2023   11:34 AM 07/01/2023    1:51 PM 12/31/2022    2:59 PM 07/23/2022    1:00 PM 07/23/2022   12:56 PM  Montreal Cognitive Assessment   Visuospatial/ Executive (0/5) 5 5 5 5 5   Naming (0/3) 3  3 3 3 3   Attention: Read list of digits (0/2) 2 2 1 2 2   Attention: Read list of letters (0/1) 1 0 0 1 1  Attention: Serial 7 subtraction starting at 100 (0/3) 3 3 2 3    Language: Repeat phrase (0/2) 2 2 1 2    Language : Fluency (0/1) 1 0 0 1   Abstraction (0/2) 2 2 2 2    Delayed Recall (0/5) 0 0 3 1   Orientation (0/6) 6 6 6 6    Total 25 23 23 26    Adjusted Score (based on education)    26      CRANIAL NERVE:  2nd, 3rd, 4th, 6th- visual fields full to confrontation, extraocular muscles intact, no nystagmus 5th - facial sensation symmetric 7th - facial strength symmetric 8th - hearing intact 9th - palate elevates symmetrically, uvula midline 11th - shoulder shrug symmetric 12th - tongue protrusion midline  MOTOR:  normal bulk and tone, full strength in the BUE, BLE  SENSORY:  normal and symmetric to light touch  COORDINATION:  finger-nose-finger, fine finger movements normal  GAIT/STATION:  Walk with a walker      DIAGNOSTIC DATA (LABS, IMAGING, TESTING) - I reviewed patient records, labs, notes, testing and imaging myself where available.  Lab Results  Component Value Date   WBC 6.5  05/09/2023   HGB 14.8 05/09/2023   HCT 45.3 05/09/2023   MCV 91.0 05/09/2023   PLT 177 05/09/2023      Component Value Date/Time   NA 141 10/11/2023 0951   K 3.8 10/11/2023 0951   CL 106 10/11/2023 0951   CO2 24 10/11/2023 0951   GLUCOSE 81 10/11/2023 0951   BUN 12 10/11/2023 0951   CREATININE 0.66 10/11/2023 0951   CREATININE 0.72 12/24/2022 1456   CALCIUM  8.8 (L) 10/11/2023 0951   PROT 7.1 05/09/2023 1143   ALBUMIN 3.7 05/09/2023 1143   AST 37 05/09/2023 1143   ALT 29 05/09/2023 1143   ALKPHOS 123 05/09/2023 1143   BILITOT 0.6 05/09/2023 1143   GFRNONAA >60 10/11/2023 0951   GFRNONAA 79 05/14/2020 1428   GFRAA 91 05/14/2020 1428   Lab Results  Component Value Date   CHOL 135 12/24/2022   HDL 55 12/24/2022   LDLCALC 67 12/24/2022   TRIG 47 12/24/2022   CHOLHDL 2.5 12/24/2022   Lab Results  Component Value Date   HGBA1C 5.5 12/24/2022   Lab Results  Component Value Date   VITAMINB12 322 07/05/2019   Lab Results  Component Value Date   TSH 3.70 12/24/2022   ATN Profile positive for Alzheimer disease biomarkers  MRI Brain 09/02/2022 - Mild periventricular and subcortical and pontine foci of chronic small vessel ischemic disease. - No acute findings.  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  She is doing well, tolerating the Leqembi  infusions well, no side effects.  Surveillance MRIs has been negative for ARIA-E and ARIA-H.  Recently moved to friend[facility] home, tells me that she likes living there.  Plan will be to continue Leqembi  treatment.  We discussed starting acetylcholine esterase inhibitor such as donepezil but we opted to hold until MoCA score is below 22 start it.  I will  see her in 6 months for follow-up  or sooner if worse.    1. Mild cognitive impairment due to Alzheimer's disease [facility])     Patient Instructions  Continue with Leqembi  infusion Continue your other medications Discussed starting acetylcholinesterase inhibitor such as donepezil, will wait untill next visit, if MoCA score decreased less than 20, will start Donepezil Please contact us  for any additional question or concern Follow-up in 6 months or sooner if worse  No orders of the defined types were placed in this encounter.   No orders of the defined types were placed in this encounter.   Return in about 6 months (around 06/13/2024).  I personally spent a total of 50 minutes in the care of the patient today including preparing to see the patient, getting/reviewing separately obtained history, performing a medically appropriate exam/evaluation, counseling and educating, documenting clinical information in the EHR, communicating results, and discussed findings, MRI results and plans with sister.   Pastor Falling, MD 12/14/2023, 12:25 PM  [facility] [address] [place], [place] [address] [phone]

## 2023-12-14 NOTE — Patient Instructions (Addendum)
 Continue with Leqembi  infusion Continue your other medications Discussed starting acetylcholinesterase inhibitor such as donepezil, will wait untill next visit, if MoCA score decreased less than 20, will start Donepezil Please contact us  for any additional question or concern Follow-up in 6 months or sooner if worse

## 2023-12-15 ENCOUNTER — Ambulatory Visit

## 2023-12-15 ENCOUNTER — Telehealth: Payer: Self-pay

## 2023-12-15 ENCOUNTER — Telehealth: Payer: Self-pay | Admitting: Neurology

## 2023-12-15 NOTE — Telephone Encounter (Signed)
 Pt called to speak to [facility] to discuss some question Pt want to discuss yesterday Visit  . Pt states she will be in office today for INFUSION 1:00  ,Pt wants to Know if she can speak to CMA for a little while

## 2023-12-15 NOTE — Telephone Encounter (Signed)
 Verbally spoke to pt when she came in for infusion. Pt stated that she would like a copy her the FAQ sheet that her sister filled out yesterday. I specifically remember placing it in the medical records box to be scanned. I will route this to debra settle to see if she has that in her office so we can give that copy to the pt and scan one into chart. I also provided the pt her own copy of faq sheet to fill out while being infused.

## 2023-12-15 NOTE — Telephone Encounter (Signed)
 Pt walked in.  Can't stay today for 1pm nurse visit for urine culture.  Will have PCP run culture while she is there

## 2023-12-17 ENCOUNTER — Other Ambulatory Visit (HOSPITAL_COMMUNITY)
Admission: RE | Admit: 2023-12-17 | Discharge: 2023-12-17 | Disposition: A | Discharge status: Home / Self Care | Source: Other Acute Inpatient Hospital | Attending: Obstetrics and Gynecology | Admitting: Obstetrics and Gynecology

## 2023-12-17 ENCOUNTER — Ambulatory Visit

## 2023-12-17 VITALS — BP 140/86 | HR 80

## 2023-12-17 DIAGNOSIS — R82998 Other abnormal findings in urine: Secondary | ICD-10-CM | POA: Insufficient documentation

## 2023-12-17 DIAGNOSIS — R350 Frequency of micturition: Secondary | ICD-10-CM

## 2023-12-17 DIAGNOSIS — R319 Hematuria, unspecified: Secondary | ICD-10-CM

## 2023-12-17 LAB — URINALYSIS, ROUTINE W REFLEX MICROSCOPIC
Bacteria, UA: NONE SEEN
Bilirubin Urine: NEGATIVE
Glucose, UA: NEGATIVE mg/dL
Hgb urine dipstick: NEGATIVE
Ketones, ur: NEGATIVE mg/dL
Nitrite: NEGATIVE
Protein, ur: NEGATIVE mg/dL
Specific Gravity, Urine: 1.016 (ref 1.005–1.030)
pH: 5 (ref 5.0–8.0)

## 2023-12-17 LAB — POCT URINALYSIS DIP (CLINITEK)
Bilirubin, UA: NEGATIVE
Glucose, UA: NEGATIVE mg/dL
Ketones, POC UA: NEGATIVE mg/dL
Nitrite, UA: NEGATIVE
POC PROTEIN,UA: NEGATIVE
Spec Grav, UA: 1.025 (ref 1.010–1.025)
Urobilinogen, UA: 0.2 U/dL
pH, UA: 5.5 (ref 5.0–8.0)

## 2023-12-17 NOTE — Progress Notes (Signed)
 Beth [name] arrived today with flank pain bilaterally, urinary frequency, and urinary urgency. Patient is notexperiencing fever, unstable vitals and/or one-sided back flank pain. Patient has not had had a recent hospitalization due to UTI.  Last visit in the office was 12/03/2023.  Per protocol:   The most recent Urinalysis completed on 12-03-2023 and was not normal.  Last Creatinine level  Lab Results  Component Value Date   CREATININE 0.74 12/13/2023    An urine specimen was collected and POCT urinalysis completed. [] A cath specimen was collected due to patient's current condition, symptoms or post-procedural state.  Total urine output by catheter is  Output by Drain (mL) 12/15/23 0701 - 12/15/23 1900 12/15/23 1901 - 12/16/23 0700 12/16/23 0701 - 12/16/23 1900 12/16/23 1901 - 12/17/23 0700 12/17/23 0701 - 12/17/23 1331  Patient has no LDAs of requested type attached.    SABRA    POCT Urine results is not normal.  Urine micro was sent per protocol for abnormal urinalysis.  Urine culture was sent per protocol for abnormal urinalysis.     [x] Pt was notified of positive urine results and plan for additional urine testing. We will contact you within the next 3-4 days with these results.   [x] No Prescription was sent to your pharmacy.  The additional testing will indicate if a prescription is needed.   Patient requested to wait for the culture results before starting any treatment and has been using the AZO intermittent.   [] Patient was notified of abnormal urine results. The following prescription is sent to your preferred pharmacy.  []  Macrobid  100mg  #10 1 tablet by mouth twice daily with food for 5 days      []  Bactrim DS 800-160mg  #6 1 tablet by mouth twice daily for 3 days        []  Due to your current medication allergies, an alternate prescription was discussed with your provider and will be prescribed and sent to your pharmacy.  [x] You can take over the counter AZO two tablets up to three  times a day for two days.  Take AZO tablets with a full glass of water. AZO will turn your urine orange, this is normal.   [x] The patient was notified of negative urine results.  If symptoms persist, you may take over the counter AZO two tablets up to three times a day for two days.  AZO will turn your urine orange, this is normal.  Contact the office back to schedule an appointment if your symptoms persist or worsen or you develop additional symptoms.       CC'd note to patient's provider.

## 2023-12-17 NOTE — Patient Instructions (Addendum)
 As we discussed your urine results. We will send out for additional testing.   It was a pleasure to see you today!  Thank you for trusting me with your care!

## 2023-12-20 ENCOUNTER — Other Ambulatory Visit (HOSPITAL_BASED_OUTPATIENT_CLINIC_OR_DEPARTMENT_OTHER): Payer: Self-pay

## 2023-12-20 ENCOUNTER — Ambulatory Visit (INDEPENDENT_AMBULATORY_CARE_PROVIDER_SITE_OTHER): Admitting: Family Medicine

## 2023-12-20 VITALS — BP 126/83 | HR 86 | Ht 65.0 in | Wt 168.4 lb

## 2023-12-20 DIAGNOSIS — E119 Type 2 diabetes mellitus without complications: Secondary | ICD-10-CM | POA: Diagnosis not present

## 2023-12-20 DIAGNOSIS — Z23 Encounter for immunization: Secondary | ICD-10-CM | POA: Diagnosis not present

## 2023-12-20 DIAGNOSIS — E1169 Type 2 diabetes mellitus with other specified complication: Secondary | ICD-10-CM | POA: Diagnosis not present

## 2023-12-20 DIAGNOSIS — R748 Abnormal levels of other serum enzymes: Secondary | ICD-10-CM

## 2023-12-20 DIAGNOSIS — Z0000 Encounter for general adult medical examination without abnormal findings: Secondary | ICD-10-CM | POA: Diagnosis not present

## 2023-12-20 DIAGNOSIS — E785 Hyperlipidemia, unspecified: Secondary | ICD-10-CM

## 2023-12-20 LAB — URINE CULTURE: Culture: 50000 — AB

## 2023-12-20 MED ORDER — COMIRNATY 30 MCG/0.3ML IM SUSY
0.3000 mL | PREFILLED_SYRINGE | Freq: Once | INTRAMUSCULAR | 0 refills | Status: AC
  Filled 2023-12-20: qty 0.3, 1d supply, fill #0

## 2023-12-20 NOTE — Assessment & Plan Note (Signed)
 Routine HCM labs reviewed. HCM reviewed/discussed. Anticipatory guidance regarding healthy weight, lifestyle and choices given. Recommend healthy diet.  Recommend approximately 150 minutes/week of moderate intensity exercise Recommend regular dental and vision exams Always use seatbelt/lap and shoulder restraints Recommend using smoke alarms and checking batteries at least twice a year Recommend using sunscreen when outside Discussed immunization recommendations

## 2023-12-20 NOTE — Patient Instructions (Signed)
  Medication Instructions:  Your physician recommends that you continue on your current medications as directed. Please refer to the Current Medication list given to you today. --If you need a refill on any your medications before your next appointment, please call your pharmacy first. If no refills are authorized on file call the office.-- Lab Work: Your physician has recommended that you have lab work today: 1 week before next visit If you have labs (blood work) drawn today and your tests are completely normal, you will receive your results via MyChart message OR a phone call from our staff.  Please ensure you check your voicemail in the event that you authorized detailed messages to be left on a delegated number. If you have any lab test that is abnormal or we need to change your treatment, we will call you to review the results.  Follow-Up: Your next appointment:   Your physician recommends that you schedule a follow-up appointment in: 6 months follow up  with Dr. de [place]  You will receive a text message or e-mail with a link to a survey about your care and experience with [place] today! We would greatly appreciate your feedback!   Thanks for letting us be apart of your health journey!!  Primary Care and Sports Medicine   Dr. Ceasar Mons [place]   We encourage you to activate your patient portal called "MyChart".  Sign up information is provided on this After Visit Summary.  MyChart is used to connect with patients for Virtual Visits (Telemedicine).  Patients are able to view lab/test results, encounter notes, upcoming appointments, etc.  Non-urgent messages can be sent to your provider as well. To learn more about what you can do with MyChart, please visit --  [email or web address].

## 2023-12-20 NOTE — Progress Notes (Signed)
 Subjective:    CC: Annual Physical Exam  HPI: Beth [name] is a 73 y.o. presenting for annual physical  I reviewed the past medical history, family history, social history, surgical history, and allergies today and no changes were needed.  Please see the problem list section below in epic for further details.  Past Medical History: Past Medical History:  Diagnosis Date   Abscessed tooth    Allergy 5   Penicillin   Alzheimer dementia (HCC)    Anemia    Anxiety    Arthritis    Bell's palsy 01/1985   Cataract I   COPD (chronic obstructive pulmonary disease) (HCC)    Depression    Diabetes mellitus without complication (HCC)    history of prior to weightloss, no meds since   Femur fracture, left (HCC)    Fibroid uterus 12/20/2014   7cm, 3.4cm and 1.9cm fibroids    Gallstones    GERD (gastroesophageal reflux disease)    Hyperlipidemia    Hypertension    IBS (irritable bowel syndrome) 02/1988   Obesity hypoventilation syndrome (HCC) 11/19/2014   Obesity, Class III, BMI 40-49.9 (morbid obesity) (HCC)    OSA (obstructive sleep apnea)    no cpap   Osteopenia    Rotator cuff tear 2023   with bicep tear   Sleep apnea    resolved 25 years ago   Vitamin D  deficiency    Past Surgical History: Past Surgical History:  Procedure Laterality Date   BLADDER SUSPENSION  03/2006, 07/2006, 01/2007   for stress incontinence   CATARACT EXTRACTION Right    CHOLECYSTECTOMY N/A 12/08/2018   Procedure: LAPAROSCOPIC CHOLECYSTECTOMY;  Surgeon: Vernetta Berg, MD;  Location: [facility] OR;  Service: General;  Laterality: N/A;   COLONOSCOPY  05/2005   Normal, recheck 10 yrs   COLONOSCOPY WITH PROPOFOL  N/A 06/27/2021   Procedure: COLONOSCOPY WITH PROPOFOL ;  Surgeon: Rollin Dover, MD;  Location: [facility];  Service: Endoscopy;  Laterality: N/A;   EXCISION OF MESH N/A 10/11/2023   Procedure: REMOVAL EXPOSED MESH VAGINAL;  Surgeon: Marilynne Rosaline SAILOR, MD;  Location: [facility] OR;  Service:  Gynecology;  Laterality: N/A;  CPT code 42704 excision of vaginal mesh; Cystoscopy to be done also   EYE SURGERY  2025   Cataracts   HEMOSTASIS CLIP PLACEMENT  06/27/2021   Procedure: HEMOSTASIS CLIP PLACEMENT;  Surgeon: Rollin Dover, MD;  Location: [facility];  Service: Endoscopy;;   JOINT REPLACEMENT  2012.2014.2020   left knee   LUMBAR FUSION Right 09/2016   L4-5 fusion by Dr. Alm Molt   POLYPECTOMY  06/27/2021   Procedure: POLYPECTOMY;  Surgeon: Rollin Dover, MD;  Location: [facility];  Service: Endoscopy;;   SPINE SURGERY  2018   TONSILLECTOMY AND ADENOIDECTOMY     TOTAL KNEE ARTHROPLASTY Left 2012   x 2 left knee   TOTAL KNEE REVISION  2014   TOTAL KNEE REVISION Left 08/2018   Dr Omar, [facility]   Social History: Social History   Socioeconomic History   Marital status: Divorced    Spouse name: Not on file   Number of children: 1   Years of education: Not on file   Highest education level: Bachelor's degree (e.g., BA, AB, BS)  Occupational History    Employer: RETIRED  Tobacco Use   Smoking status: Some Days    Current packs/day: 0.75    Average packs/day: 0.8 packs/day for 52.8 years (39.6 ttl pk-yrs)    Types: Cigarettes    Start date:  03/10/1971    Passive exposure: Current   Smokeless tobacco: Never   Tobacco comments:    Currently smoke 4-5 cigarettes a day.  Vaping Use   Vaping status: Never Used  Substance and Sexual Activity   Alcohol use: No   Drug use: No   Sexual activity: Not Currently    Partners: Male    Birth control/protection: Post-menopausal, None  Other Topics Concern   Not on file  Social History Narrative   M[facility] 09/2023.   Right handed   Caffeine 1 cup daily    Retired Engineer, civil (consulting)   Social Drivers of Corporate investment banker Strain: Low Risk  (12/20/2023)   Overall Financial Resource Strain (CARDIA)    Difficulty of Paying Living Expenses: Not hard at all  Food Insecurity: No Food Insecurity  (12/20/2023)   Hunger Vital Sign    Worried About Running Out of Food in the Last Year: Never true    Ran Out of Food in the Last Year: Never true  Transportation Needs: No Transportation Needs (12/20/2023)   PRAPARE - Administrator, Civil Service (Medical): No    Lack of Transportation (Non-Medical): No  Physical Activity: Insufficiently Active (12/20/2023)   Exercise Vital Sign    Days of Exercise per Week: 4 days    Minutes of Exercise per Session: 10 min  Stress: No Stress Concern Present (12/20/2023)   [employer] of Occupational Health - Occupational Stress Questionnaire    Feeling of Stress: Only a little  Social Connections: Socially Isolated (12/20/2023)   Social Connection and Isolation Panel    Frequency of Communication with Friends and Family: More than three times a week    Frequency of Social Gatherings with Friends and Family: Once a week    Attends Religious Services: Patient declined    Database administrator or Organizations: No    Attends Engineer, structural: Not on file    Marital Status: Divorced   Family History: Family History  Problem Relation Age of Onset   Dementia Mother    Arthritis Mother    Depression Mother    Anxiety disorder Mother    Hypertension Father    Diabetes Father    Parkinson's disease Father    Obesity Father    Stroke Father    Hyperlipidemia Father    Heart attack Paternal Grandfather 29       Died MI   Colon cancer Neg Hx    Esophageal cancer Neg Hx    Pancreatic cancer Neg Hx    Stomach cancer Neg Hx    Bladder Cancer Neg Hx    Uterine cancer Neg Hx    Renal cancer Neg Hx    Kidney cancer Neg Hx    Allergies: Allergies  Allergen Reactions   Penicillins Anaphylaxis and Rash    Anaphylaxis as a child  Other Reaction(s): Unknown   Sulfa Antibiotics Shortness Of Breath and Rash    Also light headed   Hydrocodone  Nausea And Vomiting   Celebrex [Celecoxib] Other (See Comments)    Elevates  Blood Pressure    Ciprofloxacin Nausea And Vomiting   Codeine Nausea And Vomiting   Estradiol  Rash   Levaquin [Levofloxacin] Nausea And Vomiting   Motrin Ib [Ibuprofen] Hives   Medications: See med rec.  Review of Systems: No headache, visual changes, nausea, vomiting, diarrhea, constipation, dizziness, abdominal pain, skin rash, fevers, chills, night sweats, swollen lymph nodes, weight loss, chest pain, body  aches, joint swelling, muscle aches, shortness of breath, mood changes, visual or auditory hallucinations.  Objective:    BP 126/83 (BP Location: Right Arm, Patient Position: Sitting, Cuff Size: Normal)   Pulse 86   Ht 5' 5 (1.651 m)   Wt 168 lb 6.4 oz (76.4 kg)   LMP 04/09/2009   SpO2 98%   BMI 28.02 kg/m   General: Well Developed, well nourished, and in no acute distress. Neuro: Alert and oriented x3, extra-ocular muscles intact, sensation grossly intact. Cranial nerves II through XII are intact, motor, sensory, and coordinative functions are all intact. HEENT: Normocephalic, atraumatic, pupils equal round reactive to light, neck supple, no masses, no lymphadenopathy, thyroid nonpalpable. Oropharynx, nasopharynx, external ear canals are unremarkable. Skin: Warm and dry, no rashes noted. Cardiac: Regular rate and rhythm, no murmurs rubs or gallops. Respiratory: Clear to auscultation bilaterally. Not using accessory muscles, speaking in full sentences. Abdominal: Soft, nontender, nondistended, positive bowel sounds, no masses, no organomegaly. Musculoskeletal: Shoulder, elbow, wrist, hip, knee, ankle stable, and with full range of motion.  Impression and Recommendations:    Wellness examination Assessment & Plan: Routine HCM labs reviewed. HCM reviewed/discussed. Anticipatory guidance regarding healthy weight, lifestyle and choices given. Recommend healthy diet.  Recommend approximately 150 minutes/week of moderate intensity exercise Recommend regular dental and vision  exams Always use seatbelt/lap and shoulder restraints Recommend using smoke alarms and checking batteries at least twice a year Recommend using sunscreen when outside Discussed immunization recommendations   Encounter for immunization -     Flu vaccine HIGH DOSE PF(Fluzone Trivalent)  Type 2 diabetes mellitus without complication, without long-term current use of insulin  (HCC) -     Comprehensive metabolic panel with GFR; Future -     Hemoglobin A1c; Future  Hyperlipidemia associated with type 2 diabetes mellitus (HCC) -     Lipid panel; Future  Elevated alkaline phosphatase level -     Comprehensive metabolic panel with GFR; Future  Return in about 6 months (around 06/19/2024) for diabetes, hypertension, hyperlipidemia, with fasting labs 1 week prior.   ___________________________________________ Margareth Kanner de [place], MD, ABFM, CAQSM Primary Care and Sports Medicine [facility]

## 2023-12-21 ENCOUNTER — Ambulatory Visit: Payer: Medicare Other | Admitting: Neurology

## 2023-12-24 ENCOUNTER — Encounter: Payer: Self-pay | Admitting: Neurology

## 2023-12-27 ENCOUNTER — Encounter (HOSPITAL_BASED_OUTPATIENT_CLINIC_OR_DEPARTMENT_OTHER): Payer: Self-pay | Admitting: Obstetrics & Gynecology

## 2024-01-05 ENCOUNTER — Other Ambulatory Visit: Payer: Self-pay | Admitting: Obstetrics and Gynecology

## 2024-01-05 DIAGNOSIS — N3281 Overactive bladder: Secondary | ICD-10-CM

## 2024-01-10 ENCOUNTER — Ambulatory Visit

## 2024-01-10 ENCOUNTER — Ambulatory Visit: Payer: Self-pay

## 2024-01-10 VITALS — BP 163/98 | HR 78

## 2024-01-10 DIAGNOSIS — R3915 Urgency of urination: Secondary | ICD-10-CM

## 2024-01-10 DIAGNOSIS — R300 Dysuria: Secondary | ICD-10-CM

## 2024-01-10 DIAGNOSIS — R350 Frequency of micturition: Secondary | ICD-10-CM | POA: Diagnosis not present

## 2024-01-10 DIAGNOSIS — R82998 Other abnormal findings in urine: Secondary | ICD-10-CM

## 2024-01-10 LAB — POCT URINALYSIS DIP (CLINITEK)
Bilirubin, UA: NEGATIVE
Blood, UA: NEGATIVE
Glucose, UA: NEGATIVE mg/dL
Ketones, POC UA: NEGATIVE mg/dL
Nitrite, UA: NEGATIVE
POC PROTEIN,UA: NEGATIVE
Spec Grav, UA: 1.015 (ref 1.010–1.025)
Urobilinogen, UA: 0.2 U/dL
pH, UA: 6.5 (ref 5.0–8.0)

## 2024-01-10 NOTE — Progress Notes (Signed)
 Amberia arrived today with urinary frequency and urinary urgency. Patient is notexperiencing fever, unstable vitals and/or one-sided back flank pain. Patient has not had a recent hospitalization due to UTI.  Last visit in the office was 12/17/2023.  Per protocol:   The most recent Urinalysis completed on 12-17-2023 and was notnormal.  Last Creatinine level  Lab Results  Component Value Date   CREATININE 0.74 12/13/2023    An urine specimen was collected and POCT urinalysis completed. [] A cath specimen was collected due to patient's current condition, symptoms or post-procedural state.  Total urine output by catheter is  Output by Drain (mL) 01/08/24 0701 - 01/08/24 1900 01/08/24 1901 - 01/09/24 0700 01/09/24 0701 - 01/09/24 1900 01/09/24 1901 - 01/10/24 0700 01/10/24 0701 - 01/10/24 1127  Patient has no LDAs of requested type attached.    SABRA    POCT Urine results is not normal.  Urine micro was not sent per protocol for abnormal urinalysis.  A Pathnostic test has been sent.      [x] Pt was notified of positive urine results and plan for additional urine testing. We will contact you within the next 3-4 days with these results.   [x] No Prescription was sent to your pharmacy. The additional testing will indicate if a prescription is needed.   [] Patient was notified of abnormal urine results. The following prescription is sent to your preferred pharmacy.  []  Macrobid  100mg  #10 1 tablet by mouth twice daily with food for 5 days      []  Bactrim DS 800-160mg  #6 1 tablet by mouth twice daily for 3 days        []  Due to your current medication allergies, an alternate prescription was discussed with your provider and will be prescribed and sent to your pharmacy.  [x] You can take over the counter AZO two tablets up to three times a day for two days.  Take AZO tablets with a full glass of water. AZO will turn your urine orange, this is normal.   [] The patient was notified of negative urine results.   If symptoms persist, you may take over the counter AZO two tablets up to three times a day for two days.  AZO will turn your urine orange, this is normal.  Contact the office back to schedule an appointment if your symptoms persist or worsen or you develop additional symptoms.       CC'd note to patient's provider.

## 2024-01-10 NOTE — Patient Instructions (Signed)
 We have sent your urine out for Pathnostic testing. We will call with results in 2-3 days.  Please call if you continue to experience persistent or worsening urinary symptoms such as fever > 100.4, nausea/vomiting, one sided back pain or blood in your urine.    It was a pleasure to see you today!  Thank you for trusting me with your care!

## 2024-01-13 ENCOUNTER — Other Ambulatory Visit: Payer: Self-pay | Admitting: Obstetrics and Gynecology

## 2024-01-13 ENCOUNTER — Encounter: Payer: Self-pay | Admitting: Obstetrics and Gynecology

## 2024-01-13 ENCOUNTER — Encounter: Payer: Medicare Other | Admitting: Internal Medicine

## 2024-01-13 DIAGNOSIS — N390 Urinary tract infection, site not specified: Secondary | ICD-10-CM

## 2024-01-13 MED ORDER — NITROFURANTOIN MONOHYD MACRO 100 MG PO CAPS: 100.0000 mg | ORAL_CAPSULE | Freq: Two times a day (BID) | ORAL | 0 refills | Status: AC

## 2024-01-13 NOTE — Progress Notes (Signed)
 Patient's Pathnostics Results:   Aerococcus urinae 50,000-99,999 cells/mL  Actinotignum schaalii [phone] cells/mL  Alloscardovia omnicolens 89,999-50,000 cells/mL  Coagulase Negative Staph Group <10,000 cells/mL  Sensitive to Nitrofurantoin , will send this in.

## 2024-02-01 ENCOUNTER — Telehealth: Payer: Self-pay | Admitting: Neurology

## 2024-02-01 ENCOUNTER — Telehealth: Payer: Self-pay

## 2024-02-01 DIAGNOSIS — G309 Alzheimer's disease, unspecified: Secondary | ICD-10-CM

## 2024-02-01 DIAGNOSIS — R413 Other amnesia: Secondary | ICD-10-CM

## 2024-02-01 DIAGNOSIS — G3184 Mild cognitive impairment, so stated: Secondary | ICD-10-CM

## 2024-02-01 NOTE — Telephone Encounter (Signed)
 Patient called in regards to her annual exam scheduled for January 15th and then is scheduled for surgery on January 16th. She want to know should she keep both appointments?

## 2024-02-01 NOTE — Telephone Encounter (Signed)
 Called pt at [phone].  She Architectural Technologist at [facility] is recommending OT, PT, pelvic floor therapy and Alzheimer's therapy (could not detail what this entailed). States they need medical doctor to approve/write orders.   Phone to [facility]: [phone]. Wellness coordinator recommending: extension [phone].   I called [facility] and transferred to wellness nurse/Lachrista Joshua Ada. LVM for her to cal back to discuss what is needed.

## 2024-02-01 NOTE — Telephone Encounter (Signed)
 Pt called stating that her therapist advised her to call and put in a request for PT. Please advise.

## 2024-02-06 ENCOUNTER — Encounter (HOSPITAL_BASED_OUTPATIENT_CLINIC_OR_DEPARTMENT_OTHER): Payer: Self-pay | Admitting: Obstetrics & Gynecology

## 2024-02-07 ENCOUNTER — Encounter: Payer: Self-pay | Admitting: Obstetrics and Gynecology

## 2024-02-07 NOTE — Telephone Encounter (Signed)
 nurse/Lachrista Joshua Ada from [facility] has returned call to [employer]

## 2024-02-07 NOTE — Telephone Encounter (Signed)
 Dr. Gregg- do you approve this? If so, referrals ready to be signed off on below  Took call from Lisa/phone room and spoke with [facility]. She spoke with Ileana who did a functional assessment on pt. Recommended PT/OT/ST. Requesting orders be faxed to [facility] rehab Therapy at [phone].

## 2024-02-07 NOTE — Telephone Encounter (Signed)
 Signed. Thank you.

## 2024-02-07 NOTE — Addendum Note (Signed)
 Addended byBETHA GREGG LEK on: 02/07/2024 05:23 PM   Modules accepted: Orders

## 2024-02-07 NOTE — Telephone Encounter (Signed)
 Took call from phone room/Lisa and spoke with Lachrista at [facility]. She is going to speak with wellness coordinator/Kayla and manager to see what exactly was recommended to the pt and will call back.   They will reach out to pt PCP about orders for pelvic floor therapy.

## 2024-02-07 NOTE — Addendum Note (Signed)
 Addended by: JOSHUA HERRING L on: 02/07/2024 11:06 AM   Modules accepted: Orders

## 2024-02-07 NOTE — Telephone Encounter (Signed)
 LMVM for wellness coordinator to return call relating to recommendations. (If still needed).

## 2024-02-08 ENCOUNTER — Telehealth: Payer: Self-pay | Admitting: Neurology

## 2024-02-08 NOTE — Telephone Encounter (Signed)
 Ref[facility] for Physical , Occupationa, Speech Therapy [facility]  [facility]) Phone:[phone] Fax: [phone]

## 2024-02-28 ENCOUNTER — Other Ambulatory Visit: Payer: Self-pay | Admitting: Obstetrics and Gynecology

## 2024-02-28 DIAGNOSIS — R300 Dysuria: Secondary | ICD-10-CM

## 2024-02-28 DIAGNOSIS — R82998 Other abnormal findings in urine: Secondary | ICD-10-CM

## 2024-03-07 ENCOUNTER — Ambulatory Visit: Admitting: Obstetrics and Gynecology

## 2024-03-10 NOTE — Telephone Encounter (Signed)
 Pt cancelled visit and surgery. KD CMA

## 2024-03-20 ENCOUNTER — Encounter: Payer: Self-pay | Admitting: *Deleted

## 2024-03-23 ENCOUNTER — Ambulatory Visit (HOSPITAL_BASED_OUTPATIENT_CLINIC_OR_DEPARTMENT_OTHER): Payer: Medicare Other | Admitting: Obstetrics & Gynecology

## 2024-03-24 ENCOUNTER — Ambulatory Visit (HOSPITAL_COMMUNITY): Admit: 2024-03-24 | Admitting: Obstetrics and Gynecology

## 2024-03-24 SURGERY — INSERTION, NEUROSTIMULATOR, SACRAL
Anesthesia: Monitor Anesthesia Care

## 2024-03-30 ENCOUNTER — Other Ambulatory Visit (HOSPITAL_COMMUNITY)
Admission: RE | Admit: 2024-03-30 | Discharge: 2024-03-30 | Disposition: A | Discharge status: Home / Self Care | Source: Ambulatory Visit | Attending: Obstetrics and Gynecology | Admitting: Obstetrics and Gynecology

## 2024-03-30 ENCOUNTER — Ambulatory Visit (INDEPENDENT_AMBULATORY_CARE_PROVIDER_SITE_OTHER)

## 2024-03-30 VITALS — BP 122/86 | HR 85

## 2024-03-30 DIAGNOSIS — R300 Dysuria: Secondary | ICD-10-CM | POA: Insufficient documentation

## 2024-03-30 DIAGNOSIS — R350 Frequency of micturition: Secondary | ICD-10-CM | POA: Insufficient documentation

## 2024-03-30 LAB — POCT URINALYSIS DIP (CLINITEK)
Bilirubin, UA: NEGATIVE
Glucose, UA: NEGATIVE mg/dL
Ketones, POC UA: NEGATIVE mg/dL
Nitrite, UA: NEGATIVE
POC PROTEIN,UA: NEGATIVE
Spec Grav, UA: 1.01
Urobilinogen, UA: 0.2 U/dL
pH, UA: 5.5

## 2024-03-30 LAB — URINALYSIS, COMPLETE (UACMP) WITH MICROSCOPIC
Bilirubin Urine: NEGATIVE
Glucose, UA: NEGATIVE mg/dL
Ketones, ur: NEGATIVE mg/dL
Nitrite: NEGATIVE
Protein, ur: NEGATIVE mg/dL
Specific Gravity, Urine: 1.008 (ref 1.005–1.030)
pH: 6 (ref 5.0–8.0)

## 2024-03-30 NOTE — Patient Instructions (Signed)
 Please keep all scheduled follow up appointments.   It was a pleasure to see you today!  Thank you for trusting me with your care!

## 2024-03-30 NOTE — Progress Notes (Signed)
 Beth [name] arrived today with pelvic pain, back pain, dysuria, urgency and frequency. Patient is notexperiencing fever, unstable vitals and/or one-sided back flank pain. Patient has not had had a recent hospitalization due to UTI.  Last visit in the office was 02/01/2024.  Per protocol:   The most recent Urinalysis completed on 01-10-2024 and was notnormal.  Last Creatinine level  Lab Results  Component Value Date   CREATININE 0.74 12/13/2023    An urine specimen was collected and POCT urinalysis completed.  [] A cath specimen was collected due to patient's current condition, symptoms or post-procedural state.  Total urine output by catheter is  Output by Drain (mL) 03/28/24 0701 - 03/28/24 1900 03/28/24 1901 - 03/29/24 0700 03/29/24 0701 - 03/29/24 1900 03/29/24 1901 - 03/30/24 0700 03/30/24 0701 - 03/30/24 1310  Patient has no LDAs of requested type attached.    SABRA    POCT Urine results is not normal.  Urine micro was sent per protocol for abnormal urinalysis.  Urine culture was sent per protocol for abnormal urinalysis.     [x] Pt was notified of positive urine results and plan for additional urine testing. We will contact you within the next 3-4 days with these results.  [] No Prescription was sent to your pharmacy.  The additional testing will indicate if a prescription is needed.   [] Patient was notified of abnormal urine results. The following prescription is sent to your preferred pharmacy.  []  Macrobid  100mg  #10 1 tablet by mouth twice daily with food for 5 days      []  Bactrim DS 800-160mg  #6 1 tablet by mouth twice daily for 3 days        []  Due to your current medication allergies, an alternate prescription was discussed with your provider and will be prescribed and sent to your pharmacy.   [x] You can take over the counter AZO two tablets up to three times a day for two days.  Take AZO tablets with a full glass of water. AZO will turn your urine orange, this is normal.   [] The  patient was notified of negative urine results.  If symptoms persist, you may take over the counter AZO two tablets up to three times a day for two days.  AZO will turn your urine orange, this is normal.  Contact the office back to schedule an appointment if your symptoms persist or worsen or you develop additional symptoms.       CC'd note to patient's provider.

## 2024-03-31 ENCOUNTER — Ambulatory Visit: Payer: Self-pay | Admitting: Obstetrics and Gynecology

## 2024-03-31 MED ORDER — NITROFURANTOIN MONOHYD MACRO 100 MG PO CAPS: 100.0000 mg | ORAL_CAPSULE | Freq: Two times a day (BID) | ORAL | 0 refills | Status: AC

## 2024-04-01 LAB — URINE CULTURE: Culture: >=100000 — AB

## 2024-04-20 ENCOUNTER — Encounter: Admitting: Obstetrics and Gynecology

## 2024-04-26 ENCOUNTER — Ambulatory Visit (HOSPITAL_BASED_OUTPATIENT_CLINIC_OR_DEPARTMENT_OTHER): Payer: Self-pay | Admitting: Obstetrics & Gynecology

## 2024-05-26 ENCOUNTER — Ambulatory Visit: Admitting: Obstetrics and Gynecology

## 2024-06-12 ENCOUNTER — Ambulatory Visit (HOSPITAL_BASED_OUTPATIENT_CLINIC_OR_DEPARTMENT_OTHER)

## 2024-06-19 ENCOUNTER — Ambulatory Visit (HOSPITAL_BASED_OUTPATIENT_CLINIC_OR_DEPARTMENT_OTHER): Admitting: Family Medicine

## 2024-07-18 ENCOUNTER — Ambulatory Visit: Admitting: Neurology
